# Patient Record
Sex: Male | Born: 1940 | Race: White | Hispanic: No | Marital: Married | Smoking: Never smoker
Health system: Southern US, Community
[De-identification: ages and names within clinical notes are randomized; demographics above are authoritative.]

## PROBLEM LIST (undated history)

## (undated) DIAGNOSIS — K859 Acute pancreatitis without necrosis or infection, unspecified: Secondary | ICD-10-CM

## (undated) DIAGNOSIS — F419 Anxiety disorder, unspecified: Secondary | ICD-10-CM

## (undated) DIAGNOSIS — F32A Depression, unspecified: Secondary | ICD-10-CM

## (undated) DIAGNOSIS — Z9889 Other specified postprocedural states: Secondary | ICD-10-CM

## (undated) DIAGNOSIS — R112 Nausea with vomiting, unspecified: Secondary | ICD-10-CM

## (undated) DIAGNOSIS — M1990 Unspecified osteoarthritis, unspecified site: Secondary | ICD-10-CM

## (undated) DIAGNOSIS — I350 Nonrheumatic aortic (valve) stenosis: Secondary | ICD-10-CM

## (undated) DIAGNOSIS — I4510 Unspecified right bundle-branch block: Secondary | ICD-10-CM

## (undated) DIAGNOSIS — H269 Unspecified cataract: Secondary | ICD-10-CM

## (undated) DIAGNOSIS — C61 Malignant neoplasm of prostate: Secondary | ICD-10-CM

## (undated) HISTORY — DX: Anxiety disorder, unspecified: F41.9

## (undated) HISTORY — DX: Depression, unspecified: F32.A

## (undated) HISTORY — DX: Unspecified cataract: H26.9

## (undated) HISTORY — DX: Unspecified osteoarthritis, unspecified site: M19.90

## (undated) HISTORY — PX: NO PAST SURGERIES: SHX2092

## (undated) HISTORY — PX: CATARACT EXTRACTION: SUR2

## (undated) HISTORY — PX: BACK SURGERY: SHX140

## (undated) HISTORY — PX: TONSILLECTOMY: SUR1361

## (undated) HISTORY — PX: CHOLECYSTECTOMY: SHX55

## (undated) HISTORY — PX: COLONOSCOPY: SHX174

---

## 2018-11-18 ENCOUNTER — Encounter: Payer: Self-pay | Admitting: Cardiology

## 2018-12-05 ENCOUNTER — Encounter: Payer: Self-pay | Admitting: Cardiology

## 2018-12-05 ENCOUNTER — Ambulatory Visit (INDEPENDENT_AMBULATORY_CARE_PROVIDER_SITE_OTHER): Payer: Medicare Other | Admitting: Cardiology

## 2018-12-05 ENCOUNTER — Other Ambulatory Visit: Payer: Self-pay

## 2018-12-05 DIAGNOSIS — R011 Cardiac murmur, unspecified: Secondary | ICD-10-CM

## 2018-12-05 DIAGNOSIS — E1159 Type 2 diabetes mellitus with other circulatory complications: Secondary | ICD-10-CM

## 2018-12-05 DIAGNOSIS — I10 Essential (primary) hypertension: Secondary | ICD-10-CM | POA: Insufficient documentation

## 2018-12-05 DIAGNOSIS — E088 Diabetes mellitus due to underlying condition with unspecified complications: Secondary | ICD-10-CM

## 2018-12-05 HISTORY — DX: Essential (primary) hypertension: I10

## 2018-12-05 HISTORY — DX: Type 2 diabetes mellitus with other circulatory complications: E11.59

## 2018-12-05 HISTORY — DX: Diabetes mellitus due to underlying condition with unspecified complications: E08.8

## 2018-12-05 HISTORY — DX: Cardiac murmur, unspecified: R01.1

## 2018-12-05 NOTE — Progress Notes (Signed)
Cardiology Office Note:    Date:  12/05/2018   ID:  Benjamin Davila, DOB 1940/12/21, MRN [id number]  PCP:  [facility]  Cardiologist:  Jenean Lindau, MD   Referring MD: [facility]    ASSESSMENT:    1. Cardiac murmur   2. Essential hypertension   3. Diabetes mellitus due to underlying condition with unspecified complications ([place])    PLAN:    In order of problems listed above:  1. Cardiac murmur: Undergo echocardiographic evaluation for this.  He has been told to have a cardiac murmur in the past. 2. Essential hypertension: Blood pressure is elevated but he mentions to me that his blood pressures are fine at home so we will have him keep a track of blood pressures and bring it for [place] in the next week or 2 when he comes for an echocardiogram.  Echocardiogram will be done to assess murmur heard on auscultation. 3. Diabetes mellitus: Managed by his primary care physician.  Also lipids are followed by primary care. 4. Patient will be seen in follow-up appointment in 3 months or earlier if he has any concerns.   Medication Adjustments/Labs and Tests Ordered: Current medicines are reviewed at length with the patient today.  Concerns regarding medicines are outlined above.  No orders of the defined types were placed in this encounter.  No orders of the defined types were placed in this encounter.    History of Present Illness:    Benjamin Davila is a 78 y.o. male who is being seen today for the evaluation of cardiac murmur and abnormal EKG at the request of Pa, [facility]  Patient is a pleasant 78 year old male.  He has past medical history of essential hypertension and diabetes mellitus.  He has been told to have a cardiac murmur.  He was found to have a irregular heart beat and EKG revealed sinus rhythm with PVCs and therefore was referred here.  He denies any chest pain orthopnea or PND.  He walks on a regular basis without any  symptoms.  At the time of my evaluation, the patient is alert awake oriented and in no distress.  History reviewed. No pertinent past medical history.  History reviewed. No pertinent surgical history.  Current Medications: Current Meds  Medication Sig  . atorvastatin (LIPITOR) 20 MG tablet Take 1 tablet by mouth daily.  . diazepam (VALIUM) 5 MG tablet Take 1 tablet by mouth daily.  Marland Kitchen LANTUS SOLOSTAR 100 UNIT/ML Solostar Pen   . losartan (COZAAR) 100 MG tablet Take 1 tablet by mouth daily.  . metFORMIN (GLUCOPHAGE) 500 MG tablet Take 1 tablet by mouth 2 (two) times a day.     Allergies:   Lisinopril   Social History   Socioeconomic History  . Marital status: Unknown    Spouse name: Not on file  . Number of children: Not on file  . Years of education: Not on file  . Highest education level: Not on file  Occupational History  . Not on file  Social Needs  . Financial resource strain: Not on file  . Food insecurity    Worry: Not on file    Inability: Not on file  . Transportation needs    Medical: Not on file    Non-medical: Not on file  Tobacco Use  . Smoking status: Never Smoker  . Smokeless tobacco: Never Used  Substance and Sexual Activity  . Alcohol use: Not on file  . Drug  use: Not on file  . Sexual activity: Not on file  Lifestyle  . Physical activity    Days per week: Not on file    Minutes per session: Not on file  . Stress: Not on file  Relationships  . Social Herbalist on phone: Not on file    Gets together: Not on file    Attends religious service: Not on file    Active member of club or organization: Not on file    Attends meetings of clubs or organizations: Not on file    Relationship status: Not on file  Other Topics Concern  . Not on file  Social History Narrative  . Not on file     Family History: The patient's family history is not on file.  ROS:   Please see the history of present illness.    All other systems reviewed and  are negative.  EKGs/Labs/Other Studies Reviewed:    The following studies were reviewed today: EKG reveals sinus rhythm and PVCs.   Recent Labs: No results found for requested labs within last 8760 hours.  Recent Lipid Panel No results found for: CHOL, TRIG, HDL, CHOLHDL, VLDL, LDLCALC, LDLDIRECT  Physical Exam:    VS:  BP (!) 162/84 (BP Location: Left Arm, Patient Position: Sitting, Cuff Size: Normal)   Pulse 73   Ht 5\' 10"  (1.778 m)   Wt 189 lb (85.7 kg)   SpO2 98%   BMI 27.12 kg/m     Wt Readings from Last 3 Encounters:  12/05/18 189 lb (85.7 kg)     GEN: Patient is in no acute distress HEENT: Normal NECK: No JVD; No carotid bruits LYMPHATICS: No lymphadenopathy CARDIAC: S1 S2 regular, 2/6 systolic murmur at the apex. RESPIRATORY:  Clear to auscultation without rales, wheezing or rhonchi  ABDOMEN: Soft, non-tender, non-distended MUSCULOSKELETAL:  No edema; No deformity  SKIN: Warm and dry NEUROLOGIC:  Alert and oriented x 3 PSYCHIATRIC:  Normal affect    Signed, Jenean Lindau, MD  12/05/2018 9:52 AM    [facility]

## 2018-12-05 NOTE — Addendum Note (Signed)
Addended by: Beckey Rutter on: 12/05/2018 10:06 AM   Modules accepted: Orders

## 2018-12-05 NOTE — Patient Instructions (Addendum)
Medication Instructions:  Your physician recommends that you continue on your current medications as directed. Please refer to the Current Medication list given to you today.  If you need a refill on your cardiac medications before your next appointment, please call your pharmacy.   Lab work: NONE If you have labs (blood work) drawn today and your tests are completely normal, you will receive your results only by: Marland Kitchen MyChart Message (if you have MyChart) OR . A paper copy in the mail If you have any lab test that is abnormal or we need to change your treatment, we will call you to review the results.  Testing/Procedures: You had an EKG performed today  Your physician has requested that you have an echocardiogram. Echocardiography is a painless test that uses sound waves to create images of your heart. It provides your doctor with information about the size and shape of your heart and how well your heart's chambers and valves are working. This procedure takes approximately one hour. There are no restrictions for this procedure.   Follow-Up: At [facility], you and your health needs are our priority.  As part of our continuing mission to provide you with exceptional heart care, we have created designated Provider Care Teams.  These Care Teams include your primary Cardiologist (physician) and Advanced Practice Providers (APPs -  Physician Assistants and Nurse Practitioners) who all work together to provide you with the care you need, when you need it. You will need a follow up appointment in 3 months.   Any Other Special Instructions Will Be Listed Below  Echocardiogram An echocardiogram is a procedure that uses painless sound waves (ultrasound) to produce an image of the heart. Images from an echocardiogram can provide important information about:  Signs of coronary artery disease (CAD).  Aneurysm detection. An aneurysm is a weak or damaged part of an artery wall that bulges out from the  normal force of blood pumping through the body.  Heart size and shape. Changes in the size or shape of the heart can be associated with certain conditions, including heart failure, aneurysm, and CAD.  Heart muscle function.  Heart valve function.  Signs of a past heart attack.  Fluid buildup around the heart.  Thickening of the heart muscle.  A tumor or infectious growth around the heart valves. Tell a health care provider about:  Any allergies you have.  All medicines you are taking, including vitamins, herbs, eye drops, creams, and over-the-counter medicines.  Any blood disorders you have.  Any surgeries you have had.  Any medical conditions you have.  Whether you are pregnant or may be pregnant. What are the risks? Generally, this is a safe procedure. However, problems may occur, including:  Allergic reaction to dye (contrast) that may be used during the procedure. What happens before the procedure? No specific preparation is needed. You may eat and drink normally. What happens during the procedure?   An IV tube may be inserted into one of your veins.  You may receive contrast through this tube. A contrast is an injection that improves the quality of the pictures from your heart.  A gel will be applied to your chest.  A wand-like tool (transducer) will be moved over your chest. The gel will help to transmit the sound waves from the transducer.  The sound waves will harmlessly bounce off of your heart to allow the heart images to be captured in real-time motion. The images will be recorded on a computer. The  procedure may vary among health care providers and hospitals. What happens after the procedure?  You may return to your normal, everyday life, including diet, activities, and medicines, unless your health care provider tells you not to do that. Summary  An echocardiogram is a procedure that uses painless sound waves (ultrasound) to produce an image of the  heart.  Images from an echocardiogram can provide important information about the size and shape of your heart, heart muscle function, heart valve function, and fluid buildup around your heart.  You do not need to do anything to prepare before this procedure. You may eat and drink normally.  After the echocardiogram is completed, you may return to your normal, everyday life, unless your health care provider tells you not to do that. This information is not intended to replace advice given to you by your health care provider. Make sure you discuss any questions you have with your health care provider. Document Released: 05/04/2000 Document Revised: 08/28/2018 Document Reviewed: 06/09/2016 Elsevier Patient Education  2020 [employer].

## 2018-12-11 ENCOUNTER — Ambulatory Visit (HOSPITAL_BASED_OUTPATIENT_CLINIC_OR_DEPARTMENT_OTHER)
Admission: RE | Admit: 2018-12-11 | Discharge: 2018-12-11 | Disposition: A | Discharge status: Home / Self Care | Payer: Medicare Other | Source: Ambulatory Visit | Attending: Cardiology | Admitting: Cardiology

## 2018-12-11 ENCOUNTER — Other Ambulatory Visit: Payer: Self-pay

## 2018-12-11 DIAGNOSIS — I10 Essential (primary) hypertension: Secondary | ICD-10-CM | POA: Insufficient documentation

## 2018-12-11 NOTE — Progress Notes (Signed)
  Echocardiogram 2D Echocardiogram has been performed.  Benjamin Davila 12/11/2018, 2:38 PM

## 2018-12-18 ENCOUNTER — Telehealth: Payer: Self-pay

## 2018-12-18 NOTE — Telephone Encounter (Signed)
Information relayed, patient requested printed copy to be mailed. Copy also sent to Dr. Hope Budds office per Dr. Docia Furl request.

## 2018-12-18 NOTE — Telephone Encounter (Signed)
-----   Message from Jenean Lindau, MD sent at 12/12/2018  8:02 AM EDT ----- Normal systolic function mild to moderate aortic regurgitation.  Will discuss more at appointment. Jenean Lindau, MD 12/12/2018 8:01 AM

## 2019-03-09 ENCOUNTER — Ambulatory Visit: Payer: Medicare Other | Admitting: Cardiology

## 2019-03-17 ENCOUNTER — Encounter: Payer: Self-pay | Admitting: Cardiology

## 2019-03-17 ENCOUNTER — Ambulatory Visit (INDEPENDENT_AMBULATORY_CARE_PROVIDER_SITE_OTHER): Payer: Medicare Other | Admitting: Cardiology

## 2019-03-17 ENCOUNTER — Other Ambulatory Visit: Payer: Self-pay

## 2019-03-17 VITALS — BP 142/72 | HR 72 | Ht 70.0 in | Wt 193.0 lb

## 2019-03-17 DIAGNOSIS — I350 Nonrheumatic aortic (valve) stenosis: Secondary | ICD-10-CM | POA: Insufficient documentation

## 2019-03-17 DIAGNOSIS — E088 Diabetes mellitus due to underlying condition with unspecified complications: Secondary | ICD-10-CM

## 2019-03-17 DIAGNOSIS — I10 Essential (primary) hypertension: Secondary | ICD-10-CM | POA: Diagnosis not present

## 2019-03-17 HISTORY — DX: Nonrheumatic aortic (valve) stenosis: I35.0

## 2019-03-17 NOTE — Patient Instructions (Signed)

## 2019-03-17 NOTE — Progress Notes (Signed)
Cardiology Office Note:    Date:  03/17/2019   ID:  Benjamin Davila, DOB [birth year], MRN [id number]  PCP:  Benjamin Loader, FNP  Cardiologist:  Benjamin Lindau, MD   Referring MD: [facility]    ASSESSMENT:    1. Essential hypertension   2. Aortic stenosis, moderate   3. Diabetes mellitus due to underlying condition with unspecified complications ([place])    PLAN:    In order of problems listed above:  1. Mild to moderate aortic stenosis: I discussed my findings with the patient at extensive length.  Patient had multiple questions which were answered to his satisfaction. 2. Diabetes mellitus and dyslipidemia: Patient is on statin therapy and a coated baby aspirin on a daily basis and encouraged him to continue it.  I told him to walk at least half an hour a day 5 days a week he promises to do so.  Again he is asymptomatic. 3. Patient will be seen in follow-up appointment in 6 months or earlier if the patient has any concerns    Medication Adjustments/Labs and Tests Ordered: Current medicines are reviewed at length with the patient today.  Concerns regarding medicines are outlined above.  No orders of the defined types were placed in this encounter.  No orders of the defined types were placed in this encounter.    No chief complaint on file.    History of Present Illness:    Benjamin Davila is a 78 y.o. male.  Patient was evaluated by me for cardiac murmur.  He is found to have moderate to mild aortic stenosis on echocardiogram.  Asked him multiple questions.  He denies any chest pain orthopnea PND palpitations or dizzy spells.  He is a very active gentleman.  At the time of my evaluation, the patient is alert awake oriented and in no distress.  History reviewed. No pertinent past medical history.  History reviewed. No pertinent surgical history.  Current Medications: Current Meds  Medication Sig   atorvastatin (LIPITOR) 20 MG tablet Take 1 tablet by mouth  daily.   diazepam (VALIUM) 5 MG tablet Take 1 tablet by mouth daily.   LANTUS SOLOSTAR 100 UNIT/ML Solostar Pen    losartan (COZAAR) 100 MG tablet Take 1 tablet by mouth daily.   metFORMIN (GLUCOPHAGE) 500 MG tablet Take 1 tablet by mouth 2 (two) times a day.   triamcinolone cream (KENALOG) 0.1 %      Allergies:   Lisinopril   Social History   Socioeconomic History   Marital status: Married    Spouse name: Not on file   Number of children: Not on file   Years of education: Not on file   Highest education level: Not on file  Occupational History   Not on file  Social Needs   Financial resource strain: Not on file   Food insecurity    Worry: Not on file    Inability: Not on file   Transportation needs    Medical: Not on file    Non-medical: Not on file  Tobacco Use   Smoking status: Never Smoker   Smokeless tobacco: Never Used  Substance and Sexual Activity   Alcohol use: Not on file   Drug use: Not on file   Sexual activity: Not on file  Lifestyle   Physical activity    Days per week: Not on file    Minutes per session: Not on file   Stress: Not on file  Relationships   Social  connections    Talks on phone: Not on file    Gets together: Not on file    Attends religious service: Not on file    Active member of club or organization: Not on file    Attends meetings of clubs or organizations: Not on file    Relationship status: Not on file  Other Topics Concern   Not on file  Social History Narrative   Not on file     Family History: The patient's family history is not on file.  ROS:   Please see the history of present illness.    All other systems reviewed and are negative.  EKGs/Labs/Other Studies Reviewed:    The following studies were reviewed today: IMPRESSIONS    1. The left ventricle has normal systolic function with an ejection fraction of 60-65%. The cavity size was normal. There is mild concentric left ventricular  hypertrophy. Left ventricular diastolic Doppler parameters are consistent with impaired  relaxation. Elevated mean left atrial pressure No evidence of left ventricular regional wall motion abnormalities.  2. The right ventricle has normal systolic function. The cavity was normal. There is no increase in right ventricular wall thickness.  3. The mitral valve is degenerative. There is mild mitral annular calcification present. No evidence of mitral valve stenosis.  4. The aortic valve is tricuspid. Moderate thickening of the aortic valve. Mild calcification of the aortic valve. Aortic valve regurgitation is mild to moderate by color flow Doppler. Mild-moderate stenosis of the aortic valve.  5. The aorta is normal in size and structure.  6. The aortic root, ascending aorta and aortic arch are normal in size and structure.   Recent Labs: No results found for requested labs within last 8760 hours.  Recent Lipid Panel No results found for: CHOL, TRIG, HDL, CHOLHDL, VLDL, LDLCALC, LDLDIRECT  Physical Exam:    VS:  BP (!) 142/72 (BP Location: Left Arm, Patient Position: Sitting, Cuff Size: Normal)    Pulse 72    Ht 5\' 10"  (1.778 m)    Wt 193 lb 0.6 oz (87.6 kg)    SpO2 96%    BMI 27.70 kg/m     Wt Readings from Last 3 Encounters:  03/17/19 193 lb 0.6 oz (87.6 kg)  12/05/18 189 lb (85.7 kg)     GEN: Patient is in no acute distress HEENT: Normal NECK: No JVD; No carotid bruits LYMPHATICS: No lymphadenopathy CARDIAC: Hear sounds regular, 2/6 systolic murmur at the apex. RESPIRATORY:  Clear to auscultation without rales, wheezing or rhonchi  ABDOMEN: Soft, non-tender, non-distended MUSCULOSKELETAL:  No edema; No deformity  SKIN: Warm and dry NEUROLOGIC:  Alert and oriented x 3 PSYCHIATRIC:  Normal affect   Signed, Benjamin Lindau, MD  03/17/2019 10:41 AM    [place]

## 2019-05-27 ENCOUNTER — Other Ambulatory Visit (HOSPITAL_COMMUNITY): Payer: Self-pay | Admitting: Urology

## 2019-05-27 ENCOUNTER — Other Ambulatory Visit: Payer: Self-pay | Admitting: Urology

## 2019-05-27 DIAGNOSIS — C61 Malignant neoplasm of prostate: Secondary | ICD-10-CM

## 2019-06-03 ENCOUNTER — Ambulatory Visit: Payer: Medicare Other | Attending: Internal Medicine

## 2019-06-03 DIAGNOSIS — Z23 Encounter for immunization: Secondary | ICD-10-CM | POA: Insufficient documentation

## 2019-06-03 NOTE — Progress Notes (Signed)
   Covid-19 Vaccination Clinic  Name:  Benjamin Davila    MRN: [id number] DOB: 1940/06/03  06/03/2019  Mr. Trudell was observed post Covid-19 immunization for 15 minutes without incidence. He was provided with Vaccine Information Sheet and instruction to access the V-Safe system.   Mr. Kowalsky was instructed to call 911 with any severe reactions post vaccine: Marland Kitchen Difficulty breathing  . Swelling of your face and throat  . A fast heartbeat  . A bad rash all over your body  . Dizziness and weakness    Immunizations Administered    Name Date Dose VIS Date Route   Pfizer COVID-19 Vaccine 06/03/2019  9:34 AM 0.3 mL 05/01/2019 Intramuscular   Manufacturer: [place]   Lot: [id number]   [place]: [id number]

## 2019-06-04 ENCOUNTER — Ambulatory Visit: Payer: Medicare Other

## 2019-06-15 ENCOUNTER — Encounter (HOSPITAL_COMMUNITY)
Admission: RE | Admit: 2019-06-15 | Discharge: 2019-06-15 | Disposition: A | Discharge status: Home / Self Care | Payer: Medicare Other | Source: Ambulatory Visit | Attending: Urology | Admitting: Urology

## 2019-06-15 ENCOUNTER — Other Ambulatory Visit: Payer: Self-pay

## 2019-06-15 DIAGNOSIS — C61 Malignant neoplasm of prostate: Secondary | ICD-10-CM | POA: Diagnosis present

## 2019-06-15 MED ORDER — TECHNETIUM TC 99M MEDRONATE IV KIT
20.0000 | PACK | Freq: Once | INTRAVENOUS | Status: AC | PRN
  Administered 2019-06-15: 20 via INTRAVENOUS

## 2019-06-18 ENCOUNTER — Encounter: Payer: Self-pay | Admitting: *Deleted

## 2019-06-24 ENCOUNTER — Ambulatory Visit: Payer: Medicare Other | Attending: Internal Medicine

## 2019-06-24 ENCOUNTER — Ambulatory Visit
Admission: RE | Admit: 2019-06-24 | Discharge: 2019-06-24 | Disposition: A | Discharge status: Home / Self Care | Payer: Medicare Other | Source: Ambulatory Visit | Attending: Radiation Oncology | Admitting: Radiation Oncology

## 2019-06-24 ENCOUNTER — Other Ambulatory Visit: Payer: Self-pay

## 2019-06-24 ENCOUNTER — Encounter: Payer: Self-pay | Admitting: Radiation Oncology

## 2019-06-24 ENCOUNTER — Telehealth: Payer: Self-pay | Admitting: *Deleted

## 2019-06-24 DIAGNOSIS — C61 Malignant neoplasm of prostate: Secondary | ICD-10-CM | POA: Insufficient documentation

## 2019-06-24 DIAGNOSIS — Z23 Encounter for immunization: Secondary | ICD-10-CM | POA: Insufficient documentation

## 2019-06-24 HISTORY — DX: Malignant neoplasm of prostate: C61

## 2019-06-24 NOTE — Progress Notes (Signed)
   Covid-19 Vaccination Clinic  Name:  Anees Masten    MRN: [id number] DOB: 1940/11/04  06/24/2019  Mr. Straub was observed post Covid-19 immunization for 15 minutes without incidence. He was provided with Vaccine Information Sheet and instruction to access the V-Safe system.   Mr. Toews was instructed to call 911 with any severe reactions post vaccine: Marland Kitchen Difficulty breathing  . Swelling of your face and throat  . A fast heartbeat  . A bad rash all over your body  . Dizziness and weakness    Immunizations Administered    Name Date Dose VIS Date Route   Pfizer COVID-19 Vaccine 06/24/2019  9:54 AM 0.3 mL 05/01/2019 Intramuscular   Manufacturer: [place]   Lot: [id number]   [place]: [id number]

## 2019-06-24 NOTE — Progress Notes (Signed)
Radiation Oncology         [phone] ________________________________  Initial Outpatient Consultation - Conducted via telephone due to current COVID-19 concerns for limiting patient exposure  Name: Benjamin Davila MRN: [id number]  Date: 06/24/2019  DOB: 1940/06/17  [id number], Beryle Lathe, FNP  Festus Aloe, MD   REFERRING PHYSICIAN: Festus Aloe, MD  DIAGNOSIS: 79 y.o. 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    ICD-10-CM   1. Malignant neoplasm of prostate ([place])  C61     HISTORY OF PRESENT ILLNESS: Benjamin Davila is a 79 y.o. male with a diagnosis of prostate cancer. He was noted to have an elevated PSA of 7.8 in 01/2019 by his primary care provider, Benjamin Bal, NP.  This was noted to be gradually rising since 01/2018 when the PSA was 4.54 and then increased to 5.7 in 10/2018 and 7.8 in 01/2019.  Accordingly, he was referred for evaluation in urology by Dr. Junious Davila on 04/03/19,  digital rectal examination was performed at that time revealing a 10 mm nodule in the right mid gland prostate.  The patient proceeded to transrectal ultrasound with 12 biopsies of the prostate on 05/05/19.  The prostate volume measured 44 cc.  Out of 12 core biopsies, 10 were positive.  The maximum Gleason score was 4+5, and this was seen in the right base.  Additionally, Gleason 4+3 was seen in the left mid, left mid lateral, right mid, right mid lateral, left apex, left apex lateral, right apex and right apex lateral and Gleason 3+4 disease was seen in the left base.  He underwent CT A/P and bone scan on 06/15/2019 for disease staging.  Both of these studies were without evidence of visceral or osseous metastatic disease.  He also had in office cystoscopy performed on 06/22/2019 for further evaluation of his progressive LUTS.  By patient report, this did not show any concerning findings and confirmed BPH with B00.  The patient reviewed the biopsy and  imaging results with his urologist and he has kindly been referred today for discussion of potential radiation treatment options.   PREVIOUS RADIATION THERAPY: No  PAST MEDICAL HISTORY:  Past Medical History:  Diagnosis Date  . Prostate cancer ([place])       PAST SURGICAL HISTORY:History reviewed. No pertinent surgical history.  FAMILY HISTORY: No family history on file.  SOCIAL HISTORY:  Social History   Socioeconomic History  . Marital status: Married    Spouse name: Not on file  . Number of children: Not on file  . Years of education: Not on file  . Highest education level: Not on file  Occupational History  . Not on file  Tobacco Use  . Smoking status: Never Smoker  . Smokeless tobacco: Never Used  Substance and Sexual Activity  . Alcohol use: Not on file  . Drug use: Not on file  . Sexual activity: Not on file  Other Topics Concern  . Not on file  Social History Narrative  . Not on file   Social Determinants of Health   Financial Resource Strain:   . Difficulty of Paying Living Expenses: Not on file  Food Insecurity:   . Worried About Charity fundraiser in the Last Year: Not on file  . Ran Out of Food in the Last Year: Not on file  Transportation Needs:   . Lack of Transportation (Medical): Not on file  . Lack of Transportation (Non-Medical): Not on file  Physical Activity:   . Days of Exercise per Week: Not on file  . Minutes of Exercise per Session: Not on file  Stress:   . Feeling of Stress : Not on file  Social Connections:   . Frequency of Communication with Friends and Family: Not on file  . Frequency of Social Gatherings with Friends and Family: Not on file  . Attends Religious Services: Not on file  . Active Member of Clubs or Organizations: Not on file  . Attends Archivist Meetings: Not on file  . Marital Status: Not on file  Intimate Partner Violence:   . Fear of Current or Ex-Partner: Not on file  . Emotionally Abused: Not on file   . Physically Abused: Not on file  . Sexually Abused: Not on file    ALLERGIES: Lisinopril  MEDICATIONS:  Current Outpatient Medications  Medication Sig Dispense Refill  . atorvastatin (LIPITOR) 20 MG tablet Take 1 tablet by mouth daily.    . diazepam (VALIUM) 5 MG tablet Take 1 tablet by mouth daily.    Marland Kitchen LANTUS SOLOSTAR 100 UNIT/ML Solostar Pen     . losartan (COZAAR) 100 MG tablet Take 1 tablet by mouth daily.    . metFORMIN (GLUCOPHAGE) 500 MG tablet Take 1 tablet by mouth 2 (two) times a day.    . triamcinolone cream (KENALOG) 0.1 %      No current facility-administered medications for this encounter.    REVIEW OF SYSTEMS:  On review of systems, the patient reports that he is doing well overall. He denies any chest pain, shortness of breath, cough, fevers, chills, night sweats, unintended weight changes. He denies any bowel disturbances, and denies abdominal pain, nausea or vomiting. He denies any new musculoskeletal or joint aches or pains. His IPSS was 21, indicating severe urinary symptoms with weak stream, intermittency, feelings of incomplete emptying and nocturia 4 times per night.. His SHIM was 5, indicating he has severe erectile dysfunction. A complete review of systems is obtained and is otherwise negative.    PHYSICAL EXAM:  Wt Readings from Last 3 Encounters:  03/17/19 193 lb 0.6 oz (87.6 kg)  12/05/18 189 lb (85.7 kg)   Temp Readings from Last 3 Encounters:  No data found for Temp   BP Readings from Last 3 Encounters:  03/17/19 (!) 142/72  12/05/18 (!) 162/84   Pulse Readings from Last 3 Encounters:  03/17/19 72  12/05/18 73   Pain Assessment Pain Score: 5  Pain Frequency: Occasional/10  Unable to assess due to telephone consult visit format.  KPS = 90  100 - Normal; no complaints; no evidence of disease. 90   - Able to carry on normal activity; minor signs or symptoms of disease. 80   - Normal activity with effort; some signs or symptoms of  disease. 83   - Cares for self; unable to carry on normal activity or to do active work. 60   - Requires occasional assistance, but is able to care for most of his personal needs. 50   - Requires considerable assistance and frequent medical care. 75   - Disabled; requires special care and assistance. 81   - Severely disabled; hospital admission is indicated although death not imminent. 47   - Very sick; hospital admission necessary; active supportive treatment necessary. 10   - Moribund; fatal processes progressing rapidly. 0     - Dead  Karnofsky DA, Abelmann WH, Craver LS and Burchenal [facility] [phone]) The use of the nitrogen  mustards in the palliative treatment of carcinoma: with particular reference to bronchogenic carcinoma Cancer 1 634-56  LABORATORY DATA:  No results found for: WBC, HGB, HCT, MCV, PLT No results found for: NA, K, CL, CO2 No results found for: ALT, AST, GGT, ALKPHOS, BILITOT   RADIOGRAPHY: NM Bone Scan Whole Body  Result Date: 06/15/2019 CLINICAL DATA:  79 year old male with new diagnosis of prostate cancer. PSA of 8.23. EXAM: NUCLEAR MEDICINE WHOLE BODY BONE SCAN TECHNIQUE: Whole body anterior and posterior images were obtained approximately 3 hours after intravenous injection of radiopharmaceutical. RADIOPHARMACEUTICALS:  21.0 mCi Technetium-89m MDP IV COMPARISON:  None. FINDINGS: No abnormal areas of bony activity are noted to suggest osseous metastatic disease. Mild increased activity within both knees, LEFT wrist, bilateral shoulders are compatible with degenerative changes. IMPRESSION: No evidence of osseous metastatic disease. Electronically Signed   By: Margarette Canada M.D.   On: 06/15/2019 16:46      IMPRESSION/PLAN:  This visit was conducted via telephone to spare the patient unnecessary potential exposure in the healthcare setting during the current COVID-19 pandemic. 1. 79 y.o. gentleman with Stage T2b adenocarcinoma of the prostate with Gleason Score of 4+5, and PSA of  8.23. We discussed the patient's workup and outlined the nature of prostate cancer in this setting. The patient's T stage, Gleason's score, and PSA put him into the high risk group. Accordingly, he is eligible for a variety of potential treatment options including LT-ADT concurrent with either 8 weeks of external radiation or 5 weeks of external radiation with an upfront brachytherapy boost. We discussed the available radiation techniques, and focused on the details and logistics and delivery. The patient is not an ideal candidate for brachytherapy boost with severe LUTS associated with B00. We therefore discussed and outlined the risks, benefits, short and long-term effects associated with daily external beam radiotherapy and compared and contrasted these with prostatectomy. We discussed the role of SpaceOAR gel in reducing the rectal toxicity associated with radiotherapy. We also detailed the role of ADT in the treatment of high risk prostate cancer and outlined the associated side effects that could be expected with this therapy.  He and his wife were encouraged to ask questions that were answered to their stated satisfaction.  At the conclusion of our conversation the patient is interested in moving forward with LT-ADT concurrent with 8 weeks of daily external beam radiotherapy to the prostate and pelvic lymph nodes.  He appears to have a good understanding of his disease and our treatment recommendations which are of curative intent. Therefore, we will move forward with coordinating a follow-up appointment with Dr. Junious Davila, first available, to initiate ADT now. Approximately 8 weeks after the start of ADT, he will be scheduled for fiducial markers and SpaceOAR gel insertion at [facility], prior to [place], in anticipation of beginning his daily radiation treatments shortly thereafter.  He is comfortable with and is in agreement with the stated plan.  We enjoyed meeting him and his wife today  and look forward to participating in the care of this nice gentleman. We will share our findings with Dr. Junious Davila and move forward with coordinating his treatment.  Given current concerns for patient exposure during the COVID-19 pandemic, this encounter was conducted via telephone. The patient was notified in advance and was offered a [place] meeting to allow for face to face communication but unfortunately reported that he/she did not have the appropriate resources/technology to support such a visit and instead preferred to proceed with telephone  consult. The patient has given verbal consent for this type of encounter. The time spent during this encounter was 60 minutes. The attendants for this meeting include Tyler Pita MD, Andi Devon, patient, Darreon Anspach and his wife. During the encounter, Tyler Pita MD, and Andi Devon, were located at [facility] Radiation Oncology Department.  Patient, Martavious Miera and his wife were located at home.    Nicholos Johns, PA-C    Tyler Pita, MD  [facility] Direct Dial: [phone]  Fax: [phone] [email or web address]  Skype  LinkedIn

## 2019-06-24 NOTE — Progress Notes (Signed)
Spoke with patient and wife via phone. AUA score is 21. Questions and concerns addressed. States his bone scan was negative. Patient is scheduled for second COVID shot today.

## 2019-06-24 NOTE — Patient Instructions (Signed)
Coronavirus (COVID-19) Are you at risk?  Are you at risk for the Coronavirus (COVID-19)?  To be considered HIGH RISK for Coronavirus (COVID-19), you have to meet the following criteria:  . Traveled to [place], [place], [place], [place] or [place]; or in the [place] to [place], [place], [place], or [place]; and have fever, cough, and shortness of breath within the last 2 weeks of travel OR . Been in close contact with a person diagnosed with COVID-19 within the last 2 weeks and have fever, cough, and shortness of breath . IF YOU DO NOT MEET THESE CRITERIA, YOU ARE CONSIDERED LOW RISK FOR COVID-19.  What to do if you are HIGH RISK for COVID-19?  . If you are having a medical emergency, call 911. . Seek medical care right away. Before you go to a doctor's office, urgent care or emergency department, call ahead and tell them about your recent travel, contact with someone diagnosed with COVID-19, and your symptoms. You should receive instructions from your physician's office regarding next steps of care.  . When you arrive at healthcare provider, tell the healthcare staff immediately you have returned from visiting [place], [place], [place], [place] or [place]; or traveled in the [place] to [place], [place], [place], or [place]; in the last two weeks or you have been in close contact with a person diagnosed with COVID-19 in the last 2 weeks.   . Tell the health care staff about your symptoms: fever, cough and shortness of breath. . After you have been seen by a medical provider, you will be either: o Tested for (COVID-19) and discharged home on quarantine except to seek medical care if symptoms worsen, and asked to  - Stay home and avoid contact with others until you get your results (4-5 days)  - Avoid travel on public transportation if possible (such as bus, train, or airplane) or o Sent to the Emergency Department by EMS for evaluation, COVID-19 testing, and possible  admission depending on your condition and test results.  What to do if you are LOW RISK for COVID-19?  Reduce your risk of any infection by using the same precautions used for avoiding the common cold or flu:  . Wash your hands often with soap and warm water for at least 20 seconds.  If soap and water are not readily available, use an alcohol-based hand sanitizer with at least 60% alcohol.  . If coughing or sneezing, cover your mouth and nose by coughing or sneezing into the elbow areas of your shirt or coat, into a tissue or into your sleeve (not your hands). . Avoid shaking hands with others and consider head nods or verbal greetings only. . Avoid touching your eyes, nose, or mouth with unwashed hands.  . Avoid close contact with people who are sick. . Avoid places or events with large numbers of people in one location, like concerts or sporting events. . Carefully consider travel plans you have or are making. . If you are planning any travel outside or inside the [place], visit the CDC's Travelers' Health webpage for the latest health notices. . If you have some symptoms but not all symptoms, continue to monitor at home and seek medical attention if your symptoms worsen. . If you are having a medical emergency, call 911.   ADDITIONAL HEALTHCARE OPTIONS FOR PATIENTS  [facility] / e-Visit: [email or web address]         [facility]: [phone]  [facility]: [phone]                   [facility]: [phone]   

## 2019-06-24 NOTE — Telephone Encounter (Signed)
CALLED PATIENT TO INFORM OF ADT APPT. ON 07/16/19 - ARRIVAL TIME- 9:15 AM @ DR. Lyndal Rainbow OFFICE, LVM FOR A RETURN CALL

## 2019-07-24 ENCOUNTER — Telehealth: Payer: Self-pay | Admitting: Radiation Oncology

## 2019-07-24 NOTE — Telephone Encounter (Signed)
-----   Message from Freeman Caldron, [place] sent at 07/24/2019 12:13 PM EST ----- Regarding: FW: QUESTION Sam, Please call Ms. Gresham to let her know that the prostate radiation will not affect her husband's immune system. Thank you!!! Lia Foyer ----- Message ----- From: Kerri Perches Sent: 07/24/2019  11:14 AM EST To: Freeman Caldron, PA-C Subject: QUESTION                                       Hi Ashlyn,   This patient's wife- Butch Penny called and she is wanting to know if the treatments will effect her husband's immune system.  Mrs. Barto's phone number is [phone].  Thanks,  [employer]

## 2019-07-24 NOTE — Telephone Encounter (Signed)
Phoned patient's wife, Butch Penny, as requested by [employer], PA-C. Explained that prostate radiation will NOT affect her husband's immune system. She verbalized understanding and expressed appreciation for the return call.

## 2019-07-31 ENCOUNTER — Other Ambulatory Visit: Payer: Self-pay | Admitting: Urology

## 2019-07-31 DIAGNOSIS — C61 Malignant neoplasm of prostate: Secondary | ICD-10-CM

## 2019-08-11 ENCOUNTER — Telehealth: Payer: Self-pay | Admitting: *Deleted

## 2019-08-11 NOTE — Telephone Encounter (Signed)
CALLED PATIENT TO INFORM THAT MRI HAS BEEN MOVED - ARRIVAL TIME- 11:30 AM @ [facility], SPOKE WITH PATIENT AND HE IS AWARE OF THIS APPT. CHANGE AND IS GOOD WITH IT

## 2019-08-25 DIAGNOSIS — M25552 Pain in left hip: Secondary | ICD-10-CM | POA: Insufficient documentation

## 2019-08-25 HISTORY — DX: Pain in left hip: M25.552

## 2019-09-14 ENCOUNTER — Telehealth: Payer: Self-pay | Admitting: *Deleted

## 2019-09-14 NOTE — Telephone Encounter (Signed)
CALLED PATIENT TO REMIND OF APPTS. FOR 09-15-19, LVM FOR A RETURN CALL

## 2019-09-15 ENCOUNTER — Other Ambulatory Visit: Payer: Self-pay

## 2019-09-15 ENCOUNTER — Ambulatory Visit (HOSPITAL_COMMUNITY)
Admission: RE | Admit: 2019-09-15 | Discharge: 2019-09-15 | Disposition: A | Discharge status: Home / Self Care | Payer: Medicare Other | Source: Ambulatory Visit | Attending: Urology | Admitting: Urology

## 2019-09-15 ENCOUNTER — Ambulatory Visit
Admission: RE | Admit: 2019-09-15 | Discharge: 2019-09-15 | Disposition: A | Discharge status: Home / Self Care | Payer: Medicare Other | Source: Ambulatory Visit | Attending: Radiation Oncology | Admitting: Radiation Oncology

## 2019-09-15 ENCOUNTER — Other Ambulatory Visit (HOSPITAL_COMMUNITY): Payer: Medicare Other

## 2019-09-15 ENCOUNTER — Encounter: Payer: Self-pay | Admitting: Medical Oncology

## 2019-09-15 DIAGNOSIS — C61 Malignant neoplasm of prostate: Secondary | ICD-10-CM | POA: Insufficient documentation

## 2019-09-17 NOTE — Progress Notes (Signed)
  Radiation Oncology         [phone]) [phone] ________________________________  Name: Benjamin Davila MRN: [id number]  Date: 09/15/2019  DOB: [birth year]  SIMULATION AND TREATMENT PLANNING NOTE    ICD-10-CM   1. Malignant neoplasm of prostate ([place])  C61     DIAGNOSIS:  79 y.o. 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  NARRATIVE:  The patient was brought to the [facility].  Identity was confirmed.  All relevant records and images related to the planned course of therapy were reviewed.  The patient freely provided informed written consent to proceed with treatment after reviewing the details related to the planned course of therapy. The consent form was witnessed and verified by the simulation staff.  Then, the patient was set-up in a stable reproducible supine position for radiation therapy.  A vacuum lock pillow device was custom fabricated to position his legs in a reproducible immobilized position.  Then, I performed a urethrogram under sterile conditions to identify the prostatic bed.  CT images were obtained.  Surface markings were placed.  The CT images were loaded into the planning software.  Then the prostate bed target, pelvic lymph node target and avoidance structures including the rectum, bladder, bowel and hips were contoured.  Treatment planning then occurred.  The radiation prescription was entered and confirmed.  A total of one complex treatment devices were fabricated. I have requested : Intensity Modulated Radiotherapy (IMRT) is medically necessary for this case for the following reason:  Rectal sparing.Marland Kitchen  PLAN:  The patient will receive 45 Gy in 25 fractions of 1.8 Gy, followed by a boost to the prostate to a total dose of 75 Gy with 15 additional fractions of 2 Gy.   ________________________________  Sheral Apley Tammi Klippel, M.D.

## 2019-09-22 DIAGNOSIS — C61 Malignant neoplasm of prostate: Secondary | ICD-10-CM | POA: Insufficient documentation

## 2019-09-24 ENCOUNTER — Other Ambulatory Visit: Payer: Self-pay

## 2019-09-24 ENCOUNTER — Ambulatory Visit
Admission: RE | Admit: 2019-09-24 | Discharge: 2019-09-24 | Disposition: A | Discharge status: Home / Self Care | Payer: Medicare Other | Source: Ambulatory Visit | Attending: Radiation Oncology | Admitting: Radiation Oncology

## 2019-09-24 DIAGNOSIS — C61 Malignant neoplasm of prostate: Secondary | ICD-10-CM | POA: Diagnosis not present

## 2019-09-25 ENCOUNTER — Other Ambulatory Visit: Payer: Self-pay

## 2019-09-25 ENCOUNTER — Ambulatory Visit
Admission: RE | Admit: 2019-09-25 | Discharge: 2019-09-25 | Disposition: A | Discharge status: Home / Self Care | Payer: Medicare Other | Source: Ambulatory Visit | Attending: Radiation Oncology | Admitting: Radiation Oncology

## 2019-09-25 DIAGNOSIS — C61 Malignant neoplasm of prostate: Secondary | ICD-10-CM | POA: Diagnosis not present

## 2019-09-28 ENCOUNTER — Ambulatory Visit
Admission: RE | Admit: 2019-09-28 | Discharge: 2019-09-28 | Disposition: A | Discharge status: Home / Self Care | Payer: Medicare Other | Source: Ambulatory Visit | Attending: Radiation Oncology | Admitting: Radiation Oncology

## 2019-09-28 ENCOUNTER — Other Ambulatory Visit: Payer: Self-pay

## 2019-09-28 DIAGNOSIS — C61 Malignant neoplasm of prostate: Secondary | ICD-10-CM | POA: Diagnosis not present

## 2019-09-29 ENCOUNTER — Other Ambulatory Visit: Payer: Self-pay

## 2019-09-29 ENCOUNTER — Telehealth: Payer: Self-pay | Admitting: Radiation Oncology

## 2019-09-29 ENCOUNTER — Ambulatory Visit
Admission: RE | Admit: 2019-09-29 | Discharge: 2019-09-29 | Disposition: A | Discharge status: Home / Self Care | Payer: Medicare Other | Source: Ambulatory Visit | Attending: Radiation Oncology | Admitting: Radiation Oncology

## 2019-09-29 DIAGNOSIS — C61 Malignant neoplasm of prostate: Secondary | ICD-10-CM | POA: Diagnosis not present

## 2019-09-29 NOTE — Telephone Encounter (Signed)
Received voicemail message from patient requesting wife accompany him on Friday only for PUT encounter. Patient reports difficulty comprehending some of the information received during visit. Patient understands his wife has been approved to accompany him for PUT encounter on Friday only. Patient verbalized understanding of all reviewed and appreciation for the call back.

## 2019-09-30 ENCOUNTER — Ambulatory Visit
Admission: RE | Admit: 2019-09-30 | Discharge: 2019-09-30 | Disposition: A | Discharge status: Home / Self Care | Payer: Medicare Other | Source: Ambulatory Visit | Attending: Radiation Oncology | Admitting: Radiation Oncology

## 2019-09-30 ENCOUNTER — Other Ambulatory Visit: Payer: Self-pay

## 2019-09-30 DIAGNOSIS — C61 Malignant neoplasm of prostate: Secondary | ICD-10-CM | POA: Diagnosis not present

## 2019-10-01 ENCOUNTER — Other Ambulatory Visit: Payer: Self-pay

## 2019-10-01 ENCOUNTER — Ambulatory Visit
Admission: RE | Admit: 2019-10-01 | Discharge: 2019-10-01 | Disposition: A | Discharge status: Home / Self Care | Payer: Medicare Other | Source: Ambulatory Visit | Attending: Radiation Oncology | Admitting: Radiation Oncology

## 2019-10-01 DIAGNOSIS — C61 Malignant neoplasm of prostate: Secondary | ICD-10-CM | POA: Diagnosis not present

## 2019-10-02 ENCOUNTER — Ambulatory Visit
Admission: RE | Admit: 2019-10-02 | Discharge: 2019-10-02 | Disposition: A | Discharge status: Home / Self Care | Payer: Medicare Other | Source: Ambulatory Visit | Attending: Radiation Oncology | Admitting: Radiation Oncology

## 2019-10-02 ENCOUNTER — Other Ambulatory Visit: Payer: Self-pay

## 2019-10-02 DIAGNOSIS — C61 Malignant neoplasm of prostate: Secondary | ICD-10-CM | POA: Diagnosis not present

## 2019-10-05 ENCOUNTER — Ambulatory Visit
Admission: RE | Admit: 2019-10-05 | Discharge: 2019-10-05 | Disposition: A | Discharge status: Home / Self Care | Payer: Medicare Other | Source: Ambulatory Visit | Attending: Radiation Oncology | Admitting: Radiation Oncology

## 2019-10-05 ENCOUNTER — Other Ambulatory Visit: Payer: Self-pay

## 2019-10-05 DIAGNOSIS — C61 Malignant neoplasm of prostate: Secondary | ICD-10-CM | POA: Diagnosis not present

## 2019-10-06 ENCOUNTER — Encounter: Payer: Self-pay | Admitting: Radiation Oncology

## 2019-10-06 ENCOUNTER — Encounter: Payer: Self-pay | Admitting: Medical Oncology

## 2019-10-06 ENCOUNTER — Other Ambulatory Visit: Payer: Self-pay

## 2019-10-06 ENCOUNTER — Ambulatory Visit
Admission: RE | Admit: 2019-10-06 | Discharge: 2019-10-06 | Disposition: A | Discharge status: Home / Self Care | Payer: Medicare Other | Source: Ambulatory Visit | Attending: Radiation Oncology | Admitting: Radiation Oncology

## 2019-10-06 DIAGNOSIS — C61 Malignant neoplasm of prostate: Secondary | ICD-10-CM | POA: Diagnosis not present

## 2019-10-06 NOTE — Progress Notes (Signed)
  Radiation Oncology         [phone]) [phone] ________________________________  Name: Benjamin Davila MRN: [id number]  Date: 10/06/2019  DOB: 07-Jan-1941  VIRTUAL SIMULATION NOTE  NARRATIVE:  The patient underwent simulation today for ongoing radiation therapy.  The existing CT study set was employed for the purpose of virtual treatment planning.  The target and avoidance structures were reviewed and in some cases modified.  Treatment planning then occurred.  The radiation boost prescription was entered and confirmed.   I have requested : Isodose Plan.   PLAN:  This modified radiation beam arrangement is intended to continue the current radiation dose to an additional 30 Gy in 15 fractions for a total cumulative dose of 75 Gy.  ------------------------------------------------  Sheral Apley. Tammi Klippel, M.D.

## 2019-10-07 ENCOUNTER — Ambulatory Visit
Admission: RE | Admit: 2019-10-07 | Discharge: 2019-10-07 | Disposition: A | Discharge status: Home / Self Care | Payer: Medicare Other | Source: Ambulatory Visit | Attending: Radiation Oncology | Admitting: Radiation Oncology

## 2019-10-07 ENCOUNTER — Other Ambulatory Visit: Payer: Self-pay

## 2019-10-07 DIAGNOSIS — C61 Malignant neoplasm of prostate: Secondary | ICD-10-CM | POA: Diagnosis not present

## 2019-10-08 ENCOUNTER — Other Ambulatory Visit: Payer: Self-pay

## 2019-10-08 ENCOUNTER — Ambulatory Visit
Admission: RE | Admit: 2019-10-08 | Discharge: 2019-10-08 | Disposition: A | Discharge status: Home / Self Care | Payer: Medicare Other | Source: Ambulatory Visit | Attending: Radiation Oncology | Admitting: Radiation Oncology

## 2019-10-08 DIAGNOSIS — C61 Malignant neoplasm of prostate: Secondary | ICD-10-CM | POA: Diagnosis not present

## 2019-10-09 ENCOUNTER — Other Ambulatory Visit: Payer: Self-pay

## 2019-10-09 ENCOUNTER — Ambulatory Visit
Admission: RE | Admit: 2019-10-09 | Discharge: 2019-10-09 | Disposition: A | Discharge status: Home / Self Care | Payer: Medicare Other | Source: Ambulatory Visit | Attending: Radiation Oncology | Admitting: Radiation Oncology

## 2019-10-09 DIAGNOSIS — C61 Malignant neoplasm of prostate: Secondary | ICD-10-CM | POA: Diagnosis not present

## 2019-10-12 ENCOUNTER — Other Ambulatory Visit: Payer: Self-pay

## 2019-10-12 ENCOUNTER — Ambulatory Visit
Admission: RE | Admit: 2019-10-12 | Discharge: 2019-10-12 | Disposition: A | Discharge status: Home / Self Care | Payer: Medicare Other | Source: Ambulatory Visit | Attending: Radiation Oncology | Admitting: Radiation Oncology

## 2019-10-12 DIAGNOSIS — C61 Malignant neoplasm of prostate: Secondary | ICD-10-CM | POA: Diagnosis not present

## 2019-10-13 ENCOUNTER — Other Ambulatory Visit: Payer: Self-pay

## 2019-10-13 ENCOUNTER — Ambulatory Visit
Admission: RE | Admit: 2019-10-13 | Discharge: 2019-10-13 | Disposition: A | Discharge status: Home / Self Care | Payer: Medicare Other | Source: Ambulatory Visit | Attending: Radiation Oncology | Admitting: Radiation Oncology

## 2019-10-13 DIAGNOSIS — C61 Malignant neoplasm of prostate: Secondary | ICD-10-CM | POA: Diagnosis not present

## 2019-10-14 ENCOUNTER — Ambulatory Visit
Admission: RE | Admit: 2019-10-14 | Discharge: 2019-10-14 | Disposition: A | Discharge status: Home / Self Care | Payer: Medicare Other | Source: Ambulatory Visit | Attending: Radiation Oncology | Admitting: Radiation Oncology

## 2019-10-14 ENCOUNTER — Other Ambulatory Visit: Payer: Self-pay

## 2019-10-14 DIAGNOSIS — C61 Malignant neoplasm of prostate: Secondary | ICD-10-CM | POA: Diagnosis not present

## 2019-10-15 ENCOUNTER — Other Ambulatory Visit: Payer: Self-pay

## 2019-10-15 ENCOUNTER — Ambulatory Visit
Admission: RE | Admit: 2019-10-15 | Discharge: 2019-10-15 | Disposition: A | Discharge status: Home / Self Care | Payer: Medicare Other | Source: Ambulatory Visit | Attending: Radiation Oncology | Admitting: Radiation Oncology

## 2019-10-15 DIAGNOSIS — C61 Malignant neoplasm of prostate: Secondary | ICD-10-CM | POA: Diagnosis not present

## 2019-10-16 ENCOUNTER — Ambulatory Visit
Admission: RE | Admit: 2019-10-16 | Discharge: 2019-10-16 | Disposition: A | Discharge status: Home / Self Care | Payer: Medicare Other | Source: Ambulatory Visit | Attending: Radiation Oncology | Admitting: Radiation Oncology

## 2019-10-16 ENCOUNTER — Other Ambulatory Visit: Payer: Self-pay

## 2019-10-16 DIAGNOSIS — C61 Malignant neoplasm of prostate: Secondary | ICD-10-CM | POA: Diagnosis not present

## 2019-10-20 ENCOUNTER — Other Ambulatory Visit: Payer: Self-pay

## 2019-10-20 ENCOUNTER — Ambulatory Visit
Admission: RE | Admit: 2019-10-20 | Discharge: 2019-10-20 | Disposition: A | Discharge status: Home / Self Care | Payer: Medicare Other | Source: Ambulatory Visit | Attending: Radiation Oncology | Admitting: Radiation Oncology

## 2019-10-20 DIAGNOSIS — C61 Malignant neoplasm of prostate: Secondary | ICD-10-CM | POA: Insufficient documentation

## 2019-10-21 ENCOUNTER — Other Ambulatory Visit: Payer: Self-pay

## 2019-10-21 ENCOUNTER — Ambulatory Visit
Admission: RE | Admit: 2019-10-21 | Discharge: 2019-10-21 | Disposition: A | Discharge status: Home / Self Care | Payer: Medicare Other | Source: Ambulatory Visit | Attending: Radiation Oncology | Admitting: Radiation Oncology

## 2019-10-21 DIAGNOSIS — C61 Malignant neoplasm of prostate: Secondary | ICD-10-CM | POA: Diagnosis not present

## 2019-10-22 ENCOUNTER — Other Ambulatory Visit: Payer: Self-pay

## 2019-10-22 ENCOUNTER — Ambulatory Visit
Admission: RE | Admit: 2019-10-22 | Discharge: 2019-10-22 | Disposition: A | Discharge status: Home / Self Care | Payer: Medicare Other | Source: Ambulatory Visit | Attending: Radiation Oncology | Admitting: Radiation Oncology

## 2019-10-22 DIAGNOSIS — C61 Malignant neoplasm of prostate: Secondary | ICD-10-CM | POA: Diagnosis not present

## 2019-10-23 ENCOUNTER — Ambulatory Visit
Admission: RE | Admit: 2019-10-23 | Discharge: 2019-10-23 | Disposition: A | Discharge status: Home / Self Care | Payer: Medicare Other | Source: Ambulatory Visit | Attending: Radiation Oncology | Admitting: Radiation Oncology

## 2019-10-23 ENCOUNTER — Other Ambulatory Visit: Payer: Self-pay

## 2019-10-23 DIAGNOSIS — C61 Malignant neoplasm of prostate: Secondary | ICD-10-CM | POA: Diagnosis not present

## 2019-10-26 ENCOUNTER — Other Ambulatory Visit: Payer: Self-pay

## 2019-10-26 ENCOUNTER — Ambulatory Visit
Admission: RE | Admit: 2019-10-26 | Discharge: 2019-10-26 | Disposition: A | Discharge status: Home / Self Care | Payer: Medicare Other | Source: Ambulatory Visit | Attending: Radiation Oncology | Admitting: Radiation Oncology

## 2019-10-26 DIAGNOSIS — C61 Malignant neoplasm of prostate: Secondary | ICD-10-CM | POA: Diagnosis not present

## 2019-10-27 ENCOUNTER — Other Ambulatory Visit: Payer: Self-pay

## 2019-10-27 ENCOUNTER — Ambulatory Visit
Admission: RE | Admit: 2019-10-27 | Discharge: 2019-10-27 | Disposition: A | Discharge status: Home / Self Care | Payer: Medicare Other | Source: Ambulatory Visit | Attending: Radiation Oncology | Admitting: Radiation Oncology

## 2019-10-27 DIAGNOSIS — C61 Malignant neoplasm of prostate: Secondary | ICD-10-CM | POA: Diagnosis not present

## 2019-10-28 ENCOUNTER — Ambulatory Visit
Admission: RE | Admit: 2019-10-28 | Discharge: 2019-10-28 | Disposition: A | Discharge status: Home / Self Care | Payer: Medicare Other | Source: Ambulatory Visit | Attending: Radiation Oncology | Admitting: Radiation Oncology

## 2019-10-28 ENCOUNTER — Other Ambulatory Visit: Payer: Self-pay

## 2019-10-28 DIAGNOSIS — C61 Malignant neoplasm of prostate: Secondary | ICD-10-CM | POA: Diagnosis not present

## 2019-10-29 ENCOUNTER — Other Ambulatory Visit: Payer: Self-pay

## 2019-10-29 ENCOUNTER — Ambulatory Visit
Admission: RE | Admit: 2019-10-29 | Discharge: 2019-10-29 | Disposition: A | Discharge status: Home / Self Care | Payer: Medicare Other | Source: Ambulatory Visit | Attending: Radiation Oncology | Admitting: Radiation Oncology

## 2019-10-29 DIAGNOSIS — C61 Malignant neoplasm of prostate: Secondary | ICD-10-CM | POA: Diagnosis not present

## 2019-10-30 ENCOUNTER — Ambulatory Visit
Admission: RE | Admit: 2019-10-30 | Discharge: 2019-10-30 | Disposition: A | Discharge status: Home / Self Care | Payer: Medicare Other | Source: Ambulatory Visit | Attending: Radiation Oncology | Admitting: Radiation Oncology

## 2019-10-30 ENCOUNTER — Other Ambulatory Visit: Payer: Self-pay

## 2019-10-30 DIAGNOSIS — C61 Malignant neoplasm of prostate: Secondary | ICD-10-CM | POA: Diagnosis not present

## 2019-11-02 ENCOUNTER — Ambulatory Visit
Admission: RE | Admit: 2019-11-02 | Discharge: 2019-11-02 | Disposition: A | Discharge status: Home / Self Care | Payer: Medicare Other | Source: Ambulatory Visit | Attending: Radiation Oncology | Admitting: Radiation Oncology

## 2019-11-02 ENCOUNTER — Other Ambulatory Visit: Payer: Self-pay

## 2019-11-02 DIAGNOSIS — C61 Malignant neoplasm of prostate: Secondary | ICD-10-CM | POA: Diagnosis not present

## 2019-11-03 ENCOUNTER — Ambulatory Visit
Admission: RE | Admit: 2019-11-03 | Discharge: 2019-11-03 | Disposition: A | Discharge status: Home / Self Care | Payer: Medicare Other | Source: Ambulatory Visit | Attending: Radiation Oncology | Admitting: Radiation Oncology

## 2019-11-03 ENCOUNTER — Other Ambulatory Visit: Payer: Self-pay

## 2019-11-03 DIAGNOSIS — C61 Malignant neoplasm of prostate: Secondary | ICD-10-CM | POA: Diagnosis not present

## 2019-11-04 ENCOUNTER — Other Ambulatory Visit: Payer: Self-pay

## 2019-11-04 ENCOUNTER — Ambulatory Visit
Admission: RE | Admit: 2019-11-04 | Discharge: 2019-11-04 | Disposition: A | Discharge status: Home / Self Care | Payer: Medicare Other | Source: Ambulatory Visit | Attending: Radiation Oncology | Admitting: Radiation Oncology

## 2019-11-04 DIAGNOSIS — C61 Malignant neoplasm of prostate: Secondary | ICD-10-CM | POA: Diagnosis not present

## 2019-11-05 ENCOUNTER — Other Ambulatory Visit: Payer: Self-pay

## 2019-11-05 ENCOUNTER — Ambulatory Visit
Admission: RE | Admit: 2019-11-05 | Discharge: 2019-11-05 | Disposition: A | Discharge status: Home / Self Care | Payer: Medicare Other | Source: Ambulatory Visit | Attending: Radiation Oncology | Admitting: Radiation Oncology

## 2019-11-05 DIAGNOSIS — C61 Malignant neoplasm of prostate: Secondary | ICD-10-CM | POA: Diagnosis not present

## 2019-11-06 ENCOUNTER — Ambulatory Visit
Admission: RE | Admit: 2019-11-06 | Discharge: 2019-11-06 | Disposition: A | Discharge status: Home / Self Care | Payer: Medicare Other | Source: Ambulatory Visit | Attending: Radiation Oncology | Admitting: Radiation Oncology

## 2019-11-06 ENCOUNTER — Other Ambulatory Visit: Payer: Self-pay

## 2019-11-06 DIAGNOSIS — C61 Malignant neoplasm of prostate: Secondary | ICD-10-CM | POA: Diagnosis not present

## 2019-11-09 ENCOUNTER — Ambulatory Visit
Admission: RE | Admit: 2019-11-09 | Discharge: 2019-11-09 | Disposition: A | Discharge status: Home / Self Care | Payer: Medicare Other | Source: Ambulatory Visit | Attending: Radiation Oncology | Admitting: Radiation Oncology

## 2019-11-09 ENCOUNTER — Other Ambulatory Visit: Payer: Self-pay

## 2019-11-09 DIAGNOSIS — C61 Malignant neoplasm of prostate: Secondary | ICD-10-CM | POA: Diagnosis not present

## 2019-11-10 ENCOUNTER — Ambulatory Visit
Admission: RE | Admit: 2019-11-10 | Discharge: 2019-11-10 | Disposition: A | Discharge status: Home / Self Care | Payer: Medicare Other | Source: Ambulatory Visit | Attending: Radiation Oncology | Admitting: Radiation Oncology

## 2019-11-10 ENCOUNTER — Other Ambulatory Visit: Payer: Self-pay

## 2019-11-10 DIAGNOSIS — C61 Malignant neoplasm of prostate: Secondary | ICD-10-CM | POA: Diagnosis not present

## 2019-11-11 ENCOUNTER — Ambulatory Visit
Admission: RE | Admit: 2019-11-11 | Discharge: 2019-11-11 | Disposition: A | Discharge status: Home / Self Care | Payer: Medicare Other | Source: Ambulatory Visit | Attending: Radiation Oncology | Admitting: Radiation Oncology

## 2019-11-11 ENCOUNTER — Other Ambulatory Visit: Payer: Self-pay

## 2019-11-11 DIAGNOSIS — C61 Malignant neoplasm of prostate: Secondary | ICD-10-CM | POA: Diagnosis not present

## 2019-11-12 ENCOUNTER — Other Ambulatory Visit: Payer: Self-pay

## 2019-11-12 ENCOUNTER — Ambulatory Visit
Admission: RE | Admit: 2019-11-12 | Discharge: 2019-11-12 | Disposition: A | Discharge status: Home / Self Care | Payer: Medicare Other | Source: Ambulatory Visit | Attending: Radiation Oncology | Admitting: Radiation Oncology

## 2019-11-12 DIAGNOSIS — C61 Malignant neoplasm of prostate: Secondary | ICD-10-CM | POA: Diagnosis not present

## 2019-11-13 ENCOUNTER — Other Ambulatory Visit: Payer: Self-pay

## 2019-11-13 ENCOUNTER — Ambulatory Visit
Admission: RE | Admit: 2019-11-13 | Discharge: 2019-11-13 | Disposition: A | Discharge status: Home / Self Care | Payer: Medicare Other | Source: Ambulatory Visit | Attending: Radiation Oncology | Admitting: Radiation Oncology

## 2019-11-13 DIAGNOSIS — C61 Malignant neoplasm of prostate: Secondary | ICD-10-CM | POA: Diagnosis not present

## 2019-11-16 ENCOUNTER — Other Ambulatory Visit: Payer: Self-pay

## 2019-11-16 ENCOUNTER — Ambulatory Visit
Admission: RE | Admit: 2019-11-16 | Discharge: 2019-11-16 | Disposition: A | Discharge status: Home / Self Care | Payer: Medicare Other | Source: Ambulatory Visit | Attending: Radiation Oncology | Admitting: Radiation Oncology

## 2019-11-16 DIAGNOSIS — C61 Malignant neoplasm of prostate: Secondary | ICD-10-CM | POA: Diagnosis not present

## 2019-11-17 ENCOUNTER — Ambulatory Visit
Admission: RE | Admit: 2019-11-17 | Discharge: 2019-11-17 | Disposition: A | Discharge status: Home / Self Care | Payer: Medicare Other | Source: Ambulatory Visit | Attending: Radiation Oncology | Admitting: Radiation Oncology

## 2019-11-17 ENCOUNTER — Other Ambulatory Visit: Payer: Self-pay

## 2019-11-17 DIAGNOSIS — C61 Malignant neoplasm of prostate: Secondary | ICD-10-CM | POA: Diagnosis not present

## 2019-11-18 ENCOUNTER — Other Ambulatory Visit: Payer: Self-pay

## 2019-11-18 ENCOUNTER — Ambulatory Visit
Admission: RE | Admit: 2019-11-18 | Discharge: 2019-11-18 | Disposition: A | Discharge status: Home / Self Care | Payer: Medicare Other | Source: Ambulatory Visit | Attending: Radiation Oncology | Admitting: Radiation Oncology

## 2019-11-18 DIAGNOSIS — C61 Malignant neoplasm of prostate: Secondary | ICD-10-CM | POA: Diagnosis not present

## 2019-11-19 ENCOUNTER — Other Ambulatory Visit: Payer: Self-pay

## 2019-11-19 ENCOUNTER — Encounter: Payer: Self-pay | Admitting: Radiation Oncology

## 2019-11-19 ENCOUNTER — Ambulatory Visit
Admission: RE | Admit: 2019-11-19 | Discharge: 2019-11-19 | Disposition: A | Discharge status: Home / Self Care | Payer: Medicare Other | Source: Ambulatory Visit | Attending: Radiation Oncology | Admitting: Radiation Oncology

## 2019-11-19 DIAGNOSIS — C61 Malignant neoplasm of prostate: Secondary | ICD-10-CM | POA: Insufficient documentation

## 2019-12-22 ENCOUNTER — Telehealth: Payer: Self-pay

## 2019-12-22 ENCOUNTER — Encounter: Payer: Self-pay | Admitting: Urology

## 2019-12-22 NOTE — Progress Notes (Signed)
Radiation Oncology         [phone]) [phone] ________________________________  Name: Benjamin Davila MRN: [id number]  Date: 12/23/2019  DOB: 1940-10-12  Post Treatment Note  CC: Benjamin Loader, FNP  Benjamin Aloe, MD  Diagnosis:   79 y.o. 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  Interval Since Last Radiation:  5 weeks (Concurrent with LT-ADT- 6 month Eligard started 07/16/19) 09/24/19 - 11/19/2019:  1. The prostate, seminal vesicles, and pelvic lymph nodes were initially treated to 45 Gy in 25 fractions of 1.8 Gy  2. The prostate only was boosted to 75 Gy with 15 additional fractions of 2.0 Gy   Narrative:  I spoke with the patient to conduct his routine scheduled 1 month follow up visit via telephone to spare the patient unnecessary potential exposure in the healthcare setting during the current COVID-19 pandemic.  The patient was notified in advance and gave permission to proceed with this visit format. He tolerated his recent radiation therapy relatively well with only minor urinary issues with increased frequency, urgency, hesitancy, weak stream, occasional mild dysuria and nocturia 3-4x/nihgt.  He also experienced mild fatigue but denied diarrhea or abdominal pain.  He continued to tolerate ADT well throughout his radiation treatments.         On review of systems, the patient states that he is doing well in general.  His current IPSS score is 28, indicating severe urinary symptoms with increased frequency, urgency, hesitancy, weak stream, intermittency, straining to void and nocturia 4-5 times per night.  He is also having mild dysuria at the start of his stream.  He continues to tolerate the ADT relatively well aside from more frequent and intense hot flashes over the last 3 to 4 weeks.  He denies any significant fatigue and reports a healthy appetite.  Overall, he is quite pleased with his progress to date.  ALLERGIES:  is allergic  to lisinopril.  Meds: Current Outpatient Medications  Medication Sig Dispense Refill  . atorvastatin (LIPITOR) 20 MG tablet Take 1 tablet by mouth daily.    . diazepam (VALIUM) 5 MG tablet Take 1 tablet by mouth daily.    Marland Kitchen LANTUS SOLOSTAR 100 UNIT/ML Solostar Pen     . losartan (COZAAR) 100 MG tablet Take 1 tablet by mouth daily.    . metFORMIN (GLUCOPHAGE) 500 MG tablet Take 1 tablet by mouth 2 (two) times a day.    . triamcinolone cream (KENALOG) 0.1 %      No current facility-administered medications for this encounter.    Physical Findings:  vitals were not taken for this visit.   /Unable to assess due to telephone follow up visit format.  Lab Findings: No results found for: WBC, HGB, HCT, MCV, PLT   Radiographic Findings: No results found.  Impression/Plan: 1. 79 y.o. 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 He will continue to follow up with urology for ongoing PSA determinations and has an appointment scheduled with Dr. Junious Davila at 10:15 am on 01/20/2020 when he will be due for his next Eligard injection. He understands what to expect with regards to PSA monitoring going forward. I will look forward to following his response to treatment via correspondence with urology, and would be happy to continue to participate in his care if clinically indicated. I talked to the patient about what to expect in the future, including his risk for  erectile dysfunction and rectal bleeding.  He has a prescription of Flomax at home but has not been taking this recently.  We discussed resuming the Flomax daily, taking it after supper in the evenings to see if this will help better manage his LUTS.  He will report his progress to Dr. Junious Davila at his upcoming follow-up visit.  I encouraged him to call or return to the office if he has any questions regarding his previous radiation or possible radiation side effects. He was comfortable with this  plan and will follow up as needed.  Today, a comprehensive survivorship care plan and treatment summary was reviewed with the patient today detailing his prostate cancer diagnosis, treatment course, potential late/long-term effects of treatment, appropriate follow-up care with recommendations for the future, and patient education resources.  A copy of this summary, along with a letter will be sent to the patient's primary care provider via fax after today's visit.  2. Cancer screening:  Due to Benjamin Davila's history and his age, he should receive screening for skin cancers and colon cancer.  The information and recommendations are listed on the patient's comprehensive care plan/treatment summary and were reviewed in detail with the patient.     3. Health maintenance and wellness promotion: Benjamin Davila was encouraged to consume 5-7 servings of fruits and vegetables per day. He was provided a copy of the "Nutrition Rainbow" handout, as well as the handout "Take Control of Your Health and [place]" from the [employer].  He was also encouraged to engage in moderate to vigorous exercise for 30 minutes per day most days of the week. Information was provided regarding the [employer] fitness program, which is designed for cancer survivors to help them become more physically fit after cancer treatments. We discussed that a healthy BMI is 18.5-24.9 and that maintaining a healthy weight reduces risk of cancer recurrences.  He was instructed to limit his alcohol consumption and continue to abstain from tobacco use.  Lastly, he was encouraged to use sunscreen and wear protective clothing when in the sun.     4. Support services/counseling: It is not uncommon for this period of the patient's cancer care trajectory to be one of many emotions and stressors.  Benjamin Davila was encouraged to take advantage of our many support services programs, support groups, and/or counseling in coping with his  new life as a cancer survivor after completing anti-cancer treatment.  He was offered support today through active listening and expressive supportive counseling.  He was given information regarding our available services and encouraged to contact me with any questions or for help enrolling in any of our support group/programs.       Nicholos Johns, PA-C

## 2019-12-22 NOTE — Progress Notes (Signed)
Patient has telephone follow-up appointment with Ashlyn Bruning PA. Patient denies having fatigue. Patient states nocturia 4-5 times per night. Patient states mild dysuria. Patient states having a weak urine stream. Patient states that he is not emptying his bladder completely and returning to the bathroom 10-15 minutes later. Patient states urgency all the time. Patient states hesitancy and straining. Patient denies having any leakage of urine. Patient states that he has had increase hot flashes. Patient states that he has a follow up appointment with [facility] on September 1st.

## 2019-12-22 NOTE — Telephone Encounter (Signed)
Spoke with patient in regards to follow-up appointment with Freeman Caldron PA on 12/23/19 at 2:00pm. Patient verbalized understanding of appointment date and time. Reviewed meaningful use, AUA and prostate questions. TM

## 2019-12-22 NOTE — Telephone Encounter (Signed)
Left voicemail message for patient to call back in regards to telephone follow-up with Ashlyn Bruning PA on 12/23/19 at 2:00pm. Called to review meaningful use, AUA and prostate questions. TM

## 2019-12-23 ENCOUNTER — Other Ambulatory Visit: Payer: Self-pay

## 2019-12-23 ENCOUNTER — Ambulatory Visit
Admission: RE | Admit: 2019-12-23 | Discharge: 2019-12-23 | Disposition: A | Discharge status: Home / Self Care | Payer: Medicare Other | Source: Ambulatory Visit | Attending: Urology | Admitting: Urology

## 2019-12-23 DIAGNOSIS — C61 Malignant neoplasm of prostate: Secondary | ICD-10-CM

## 2020-05-21 NOTE — Progress Notes (Signed)
  Radiation Oncology         [phone]) [phone] ________________________________  Name: Benjamin Davila MRN: [id number]  Date: 11/19/2019  DOB: 01-Feb-1941  End of Treatment Note  Diagnosis:   80 y.o. 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    Indication for treatment:  Curative, Definitive Radiotherapy       Radiation treatment dates:   09/24/19-11/19/19  Site/dose:  1. The prostate, seminal vesicles, and pelvic lymph nodes were initially treated to 45 Gy in 25 fractions of 1.8 Gy  2. The prostate only was boosted to 75 Gy with 15 additional fractions of 2.0 Gy   Beams/energy:  1. The prostate, seminal vesicles, and pelvic lymph nodes were initially treated using VMAT intensity modulated radiotherapy delivering 6 megavolt photons. Image guidance was performed with CB-CT studies prior to each fraction. He was immobilized with a body fix lower extremity mold.  2. the prostate only was boosted using VMAT intensity modulated radiotherapy delivering 6 megavolt photons. Image guidance was performed with CB-CT studies prior to each fraction. He was immobilized with a body fix lower extremity mold.  Narrative: The patient tolerated radiation treatment relatively well.   The patient experienced some minor urinary irritation and modest fatigue.    Plan: The patient has completed radiation treatment. He will return to radiation oncology clinic for routine followup in one month. I advised him to call or return sooner if he has any questions or concerns related to his recovery or treatment. ________________________________  Artist Pais. Kathrynn Running, M.D.

## 2020-06-17 DIAGNOSIS — M961 Postlaminectomy syndrome, not elsewhere classified: Secondary | ICD-10-CM | POA: Insufficient documentation

## 2020-06-17 DIAGNOSIS — M5416 Radiculopathy, lumbar region: Secondary | ICD-10-CM | POA: Insufficient documentation

## 2020-06-17 HISTORY — DX: Postlaminectomy syndrome, not elsewhere classified: M96.1

## 2020-06-17 HISTORY — DX: Radiculopathy, lumbar region: M54.16

## 2020-07-12 ENCOUNTER — Other Ambulatory Visit: Payer: Self-pay

## 2020-07-12 DIAGNOSIS — K754 Autoimmune hepatitis: Secondary | ICD-10-CM | POA: Insufficient documentation

## 2020-07-12 DIAGNOSIS — Z794 Long term (current) use of insulin: Secondary | ICD-10-CM | POA: Insufficient documentation

## 2020-07-12 DIAGNOSIS — E1140 Type 2 diabetes mellitus with diabetic neuropathy, unspecified: Secondary | ICD-10-CM

## 2020-07-12 DIAGNOSIS — Z862 Personal history of diseases of the blood and blood-forming organs and certain disorders involving the immune mechanism: Secondary | ICD-10-CM | POA: Insufficient documentation

## 2020-07-12 DIAGNOSIS — L2082 Flexural eczema: Secondary | ICD-10-CM | POA: Insufficient documentation

## 2020-07-12 DIAGNOSIS — C61 Malignant neoplasm of prostate: Secondary | ICD-10-CM | POA: Insufficient documentation

## 2020-07-12 DIAGNOSIS — I499 Cardiac arrhythmia, unspecified: Secondary | ICD-10-CM | POA: Insufficient documentation

## 2020-07-12 DIAGNOSIS — E785 Hyperlipidemia, unspecified: Secondary | ICD-10-CM

## 2020-07-12 DIAGNOSIS — E1169 Type 2 diabetes mellitus with other specified complication: Secondary | ICD-10-CM

## 2020-07-12 DIAGNOSIS — F39 Unspecified mood [affective] disorder: Secondary | ICD-10-CM | POA: Insufficient documentation

## 2020-07-12 DIAGNOSIS — Z8719 Personal history of other diseases of the digestive system: Secondary | ICD-10-CM | POA: Insufficient documentation

## 2020-07-12 DIAGNOSIS — N400 Enlarged prostate without lower urinary tract symptoms: Secondary | ICD-10-CM | POA: Insufficient documentation

## 2020-07-12 DIAGNOSIS — R011 Cardiac murmur, unspecified: Secondary | ICD-10-CM | POA: Insufficient documentation

## 2020-07-12 HISTORY — DX: Personal history of diseases of the blood and blood-forming organs and certain disorders involving the immune mechanism: Z86.2

## 2020-07-12 HISTORY — DX: Long term (current) use of insulin: Z79.4

## 2020-07-12 HISTORY — DX: Hyperlipidemia, unspecified: E78.5

## 2020-07-12 HISTORY — DX: Cardiac arrhythmia, unspecified: I49.9

## 2020-07-12 HISTORY — DX: Type 2 diabetes mellitus with diabetic neuropathy, unspecified: E11.40

## 2020-07-12 HISTORY — DX: Personal history of other diseases of the digestive system: Z87.19

## 2020-07-12 HISTORY — DX: Type 2 diabetes mellitus with other specified complication: E11.69

## 2020-07-12 HISTORY — DX: Flexural eczema: L20.82

## 2020-07-12 HISTORY — DX: Unspecified mood (affective) disorder: F39

## 2020-07-12 HISTORY — DX: Cardiac murmur, unspecified: R01.1

## 2020-07-12 HISTORY — DX: Benign prostatic hyperplasia without lower urinary tract symptoms: N40.0

## 2020-07-12 HISTORY — DX: Autoimmune hepatitis: K75.4

## 2020-07-14 ENCOUNTER — Other Ambulatory Visit: Payer: Self-pay

## 2020-07-14 ENCOUNTER — Ambulatory Visit (INDEPENDENT_AMBULATORY_CARE_PROVIDER_SITE_OTHER): Payer: Medicare Other | Admitting: Cardiology

## 2020-07-14 ENCOUNTER — Encounter: Payer: Self-pay | Admitting: Cardiology

## 2020-07-14 VITALS — BP 158/88 | HR 101 | Ht 70.0 in | Wt 185.0 lb

## 2020-07-14 DIAGNOSIS — I351 Nonrheumatic aortic (valve) insufficiency: Secondary | ICD-10-CM | POA: Insufficient documentation

## 2020-07-14 DIAGNOSIS — E088 Diabetes mellitus due to underlying condition with unspecified complications: Secondary | ICD-10-CM

## 2020-07-14 DIAGNOSIS — E781 Pure hyperglyceridemia: Secondary | ICD-10-CM

## 2020-07-14 DIAGNOSIS — I350 Nonrheumatic aortic (valve) stenosis: Secondary | ICD-10-CM | POA: Diagnosis not present

## 2020-07-14 DIAGNOSIS — Z8719 Personal history of other diseases of the digestive system: Secondary | ICD-10-CM

## 2020-07-14 DIAGNOSIS — I10 Essential (primary) hypertension: Secondary | ICD-10-CM

## 2020-07-14 DIAGNOSIS — C61 Malignant neoplasm of prostate: Secondary | ICD-10-CM

## 2020-07-14 HISTORY — DX: Nonrheumatic aortic (valve) insufficiency: I35.1

## 2020-07-14 NOTE — Patient Instructions (Signed)
Medication Instructions:  Your physician recommends that you continue on your current medications as directed. Please refer to the Current Medication list given to you today.  *If you need a refill on your cardiac medications before your next appointment, please call your pharmacy*   Lab Work: None If you have labs (blood work) drawn today and your tests are completely normal, you will receive your results only by: Marland Kitchen MyChart Message (if you have MyChart) OR . A paper copy in the mail If you have any lab test that is abnormal or we need to change your treatment, we will call you to review the results.   Testing/Procedures: Your physician has requested that you have an echocardiogram. Echocardiography is a painless test that uses sound waves to create images of your heart. It provides your doctor with information about the size and shape of your heart and how well your heart's chambers and valves are working. This procedure takes approximately one hour. There are no restrictions for this procedure.     Follow-Up: At [facility], you and your health needs are our priority.  As part of our continuing mission to provide you with exceptional heart care, we have created designated Provider Care Teams.  These Care Teams include your primary Cardiologist (physician) and Advanced Practice Providers (APPs -  Physician Assistants and Nurse Practitioners) who all work together to provide you with the care you need, when you need it.  We recommend signing up for the patient portal called "MyChart".  Sign up information is provided on this After Visit Summary.  MyChart is used to connect with patients for Virtual Visits (Telemedicine).  Patients are able to view lab/test results, encounter notes, upcoming appointments, etc.  Non-urgent messages can be sent to your provider as well.   To learn more about what you can do with MyChart, go to [email or web address].    Your next appointment:   6  month(s)  The format for your next appointment:   In Person  Provider:   Jyl Heinz, MD   Other Instructions   Echocardiogram An echocardiogram is a test that uses sound waves (ultrasound) to produce images of the heart. Images from an echocardiogram can provide important information about:  Heart size and shape.  The size and thickness and movement of your heart's walls.  Heart muscle function and strength.  Heart valve function or if you have stenosis. Stenosis is when the heart valves are too narrow.  If blood is flowing backward through the heart valves (regurgitation).  A tumor or infectious growth around the heart valves.  Areas of heart muscle that are not working well because of poor blood flow or injury from a heart attack.  Aneurysm detection. An aneurysm is a weak or damaged part of an artery wall. The wall bulges out from the normal force of blood pumping through the body. Tell a health care provider about:  Any allergies you have.  All medicines you are taking, including vitamins, herbs, eye drops, creams, and over-the-counter medicines.  Any blood disorders you have.  Any surgeries you have had.  Any medical conditions you have.  Whether you are pregnant or may be pregnant. What are the risks? Generally, this is a safe test. However, problems may occur, including an allergic reaction to dye (contrast) that may be used during the test. What happens before the test? No specific preparation is needed. You may eat and drink normally. What happens during the test?  You will take off your  clothes from the waist up and put on a hospital gown.  Electrodes or electrocardiogram (ECG)patches may be placed on your chest. The electrodes or patches are then connected to a device that monitors your heart rate and rhythm.  You will lie down on a table for an ultrasound exam. A gel will be applied to your chest to help sound waves pass through your skin.  A  handheld device, called a transducer, will be pressed against your chest and moved over your heart. The transducer produces sound waves that travel to your heart and bounce back (or "echo" back) to the transducer. These sound waves will be captured in real-time and changed into images of your heart that can be viewed on a video monitor. The images will be recorded on a computer and reviewed by your health care provider.  You may be asked to change positions or hold your breath for a short time. This makes it easier to get different views or better views of your heart.  In some cases, you may receive contrast through an IV in one of your veins. This can improve the quality of the pictures from your heart. The procedure may vary among health care providers and hospitals.   What can I expect after the test? You may return to your normal, everyday life, including diet, activities, and medicines, unless your health care provider tells you not to do that. Follow these instructions at home:  It is up to you to get the results of your test. Ask your health care provider, or the department that is doing the test, when your results will be ready.  Keep all follow-up visits. This is important. Summary  An echocardiogram is a test that uses sound waves (ultrasound) to produce images of the heart.  Images from an echocardiogram can provide important information about the size and shape of your heart, heart muscle function, heart valve function, and other possible heart problems.  You do not need to do anything to prepare before this test. You may eat and drink normally.  After the echocardiogram is completed, you may return to your normal, everyday life, unless your health care provider tells you not to do that. This information is not intended to replace advice given to you by your health care provider. Make sure you discuss any questions you have with your health care provider. Document Revised:  12/29/2019 Document Reviewed: 12/29/2019 Elsevier Patient Education  2021 Elsevier Inc.   

## 2020-07-14 NOTE — Progress Notes (Signed)
Cardiology Office Note:    Date:  07/14/2020   ID:  Benjamin Davila, Benjamin Davila 09/11/1940, MRN [id number]  PCP:  Kristen Loader, FNP  Cardiologist:  Jenean Lindau, MD   Referring MD: Kristen Loader, FNP    ASSESSMENT:    1. Aortic stenosis, moderate   2. Essential hypertension   3. Pure hypertriglyceridemia   4. Diabetes mellitus due to underlying condition with unspecified complications ([place])   5. Malignant neoplasm of prostate ([place])   6. History of pancreatitis   7. Aortic valve insufficiency, etiology of cardiac valve disease unspecified    PLAN:    In order of problems listed above:  1. Primary prevention stressed with the patient.  Importance of compliance with diet medication stressed any vocalized understanding. 2. Mild to moderate aortic stenosis and aortic regurgitation: Medical management at this time.  As mentioned below patient is asymptomatic and active gentleman.  Echocardiogram will be done to assess the progress. 3. Essential hypertension: Blood pressure is mildly elevated.  I have told him to keep a track of his blood pressures at home and send me a log in the next 2 weeks.  He promises to do so.  Diet was emphasized.  Salt intake issues were discussed. 4. Diabetes mellitus: Managed by primary care.  I am unaware of her last hemoglobin A1c but the one done several months ago was elevated and I cautioned him about it. 5. Mixed dyslipidemia: Diet was emphasized.  Lipids were reviewed he seems to be doing well on this front. 6. Patient will be seen in follow-up appointment in 6 months or earlier if the patient has any concerns    Medication Adjustments/Labs and Tests Ordered: Current medicines are reviewed at length with the patient today.  Concerns regarding medicines are outlined above.  Orders Placed This Encounter  Procedures  . EKG 12-Lead  . ECHOCARDIOGRAM COMPLETE   No orders of the defined types were placed in this encounter.    No chief complaint on  file.    History of Present Illness:    Benjamin Davila is a 80 y.o. male.  Patient has past medical history of mild to moderate aortic stenosis, aortic regurgitation, essential hypertension, dyslipidemia and diabetes mellitus.  He has had history of pancreatitis.  Last year he underwent treatment for prostate cancer with radiation and doing well.  No chest pain orthopnea or PND.  No dizziness syncopal spells or any dyspnea on exertion.  At the time of my evaluation, the patient is alert awake oriented and in no distress.  Past Medical History:  Diagnosis Date  . Aortic stenosis, moderate 03/17/2019  . 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 Benign prostatic hyperplasia without lower urinary tract symptoms 07/12/2020  . Cardiac murmur 12/05/2018  . Diabetes mellitus due to underlying condition with unspecified complications ([place]) 08/27/8117  . Essential hypertension 12/05/2018  . 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 History of pancreatitis 07/12/2020  . History of pancytopenia 07/12/2020  . Hyperlipidemia, unspecified 07/12/2020  . Irregular heart beat 07/12/2020  . Long term (current) use of insulin ([place]) 07/12/2020  . Malignant neoplasm of prostate ([place]) 06/24/2019  . Mood disorder ([place]) 07/12/2020  . Prostate cancer ([place])   . Systolic murmur 1/47/8295  . Type 2 diabetes mellitus with other specified complication ([place]) 11/08/3084    Past Surgical History:  Procedure Laterality Date  . NO PAST SURGERIES      Current Medications: Current Meds  Medication Sig  . atorvastatin (LIPITOR) 20 MG tablet  Take 1 tablet by mouth daily.  . insulin glargine (LANTUS SOLOSTAR) 100 UNIT/ML Solostar Pen Inject 24 Units into the skin daily.  Marland Kitchen losartan (COZAAR) 50 MG tablet Take 50 mg by mouth daily.  . metFORMIN (GLUCOPHAGE) 500 MG tablet Take 1 tablet by mouth 2 (two) times a day.     Allergies:   Lisinopril   Social History   Socioeconomic History  . Marital status: Married    Spouse name: Not on file   . Number of children: Not on file  . Years of education: Not on file  . Highest education level: Not on file  Occupational History  . Not on file  Tobacco Use  . Smoking status: Never Smoker  . Smokeless tobacco: Never Used  Substance and Sexual Activity  . Alcohol use: Not on file  . Drug use: Not on file  . Sexual activity: Not on file  Other Topics Concern  . Not on file  Social History Narrative  . Not on file   Social Determinants of Health   Financial Resource Strain: Not on file  Food Insecurity: Not on file  Transportation Needs: Not on file  Physical Activity: Not on file  Stress: Not on file  Social Connections: Not on file     Family History: The patient's family history is not on file.  ROS:   Please see the history of present illness.    All other systems reviewed and are negative.  EKGs/Labs/Other Studies Reviewed:    The following studies were reviewed today: IMPRESSIONS    1. The left ventricle has normal systolic function with an ejection  fraction of 60-65%. The cavity size was normal. There is mild concentric  left ventricular hypertrophy. Left ventricular diastolic Doppler  parameters are consistent with impaired  relaxation. Elevated mean left atrial pressure No evidence of left  ventricular regional wall motion abnormalities.  2. The right ventricle has normal systolic function. The cavity was  normal. There is no increase in right ventricular wall thickness.  3. The mitral valve is degenerative. There is mild mitral annular  calcification present. No evidence of mitral valve stenosis.  4. The aortic valve is tricuspid. Moderate thickening of the aortic  valve. Mild calcification of the aortic valve. Aortic valve regurgitation  is mild to moderate by color flow Doppler. Mild-moderate stenosis of the  aortic valve.  5. The aorta is normal in size and structure.  6. The aortic root, ascending aorta and aortic arch are normal in size   and structure.    Recent Labs: No results found for requested labs within last 8760 hours.  Recent Lipid Panel No results found for: CHOL, TRIG, HDL, CHOLHDL, VLDL, LDLCALC, LDLDIRECT  Physical Exam:    VS:  BP (!) 158/88   Pulse (!) 101   Ht 5\' 10"  (1.778 m)   Wt 185 lb 0.6 oz (83.9 kg)   SpO2 98%   BMI 26.55 kg/m     Wt Readings from Last 3 Encounters:  07/14/20 185 lb 0.6 oz (83.9 kg)  03/17/19 193 lb 0.6 oz (87.6 kg)  12/05/18 189 lb (85.7 kg)     GEN: Patient is in no acute distress HEENT: Normal NECK: No JVD; No carotid bruits LYMPHATICS: No lymphadenopathy CARDIAC: Hear sounds regular, 2/6 systolic murmur at the apex. RESPIRATORY:  Clear to auscultation without rales, wheezing or rhonchi  ABDOMEN: Soft, non-tender, non-distended MUSCULOSKELETAL:  No edema; No deformity  SKIN: Warm and dry NEUROLOGIC:  Alert and  oriented x 3 PSYCHIATRIC:  Normal affect   Signed, Jenean Lindau, MD  07/14/2020 2:59 PM    [place]

## 2020-07-28 DIAGNOSIS — M47816 Spondylosis without myelopathy or radiculopathy, lumbar region: Secondary | ICD-10-CM | POA: Insufficient documentation

## 2020-07-28 DIAGNOSIS — M48061 Spinal stenosis, lumbar region without neurogenic claudication: Secondary | ICD-10-CM | POA: Insufficient documentation

## 2020-07-28 HISTORY — DX: Spinal stenosis, lumbar region without neurogenic claudication: M48.061

## 2020-07-28 HISTORY — DX: Spondylosis without myelopathy or radiculopathy, lumbar region: M47.816

## 2020-08-12 ENCOUNTER — Ambulatory Visit (HOSPITAL_BASED_OUTPATIENT_CLINIC_OR_DEPARTMENT_OTHER)
Admission: RE | Admit: 2020-08-12 | Discharge: 2020-08-12 | Disposition: A | Discharge status: Home / Self Care | Payer: Medicare Other | Source: Ambulatory Visit | Attending: Cardiology | Admitting: Cardiology

## 2020-08-12 ENCOUNTER — Other Ambulatory Visit: Payer: Self-pay

## 2020-08-12 DIAGNOSIS — I350 Nonrheumatic aortic (valve) stenosis: Secondary | ICD-10-CM | POA: Insufficient documentation

## 2020-08-12 LAB — ECHOCARDIOGRAM COMPLETE
AR max vel: 1.3 cm2
AV Area VTI: 1.38 cm2
AV Area mean vel: 1.36 cm2
AV Mean grad: 20 mmHg
AV Peak grad: 36.7 mmHg
Ao pk vel: 3.03 m/s
Area-P 1/2: 2 cm2
P 1/2 time: 406 msec
S' Lateral: 2.47 cm

## 2020-10-21 ENCOUNTER — Other Ambulatory Visit: Payer: Self-pay

## 2020-10-21 ENCOUNTER — Encounter (HOSPITAL_BASED_OUTPATIENT_CLINIC_OR_DEPARTMENT_OTHER): Payer: Self-pay | Admitting: Physical Therapy

## 2020-10-21 ENCOUNTER — Ambulatory Visit (HOSPITAL_BASED_OUTPATIENT_CLINIC_OR_DEPARTMENT_OTHER): Payer: Medicare Other | Attending: Orthopedic Surgery | Admitting: Physical Therapy

## 2020-10-21 DIAGNOSIS — M25552 Pain in left hip: Secondary | ICD-10-CM | POA: Insufficient documentation

## 2020-10-21 DIAGNOSIS — R262 Difficulty in walking, not elsewhere classified: Secondary | ICD-10-CM | POA: Diagnosis present

## 2020-10-21 DIAGNOSIS — M6281 Muscle weakness (generalized): Secondary | ICD-10-CM | POA: Diagnosis present

## 2020-10-21 DIAGNOSIS — M545 Low back pain, unspecified: Secondary | ICD-10-CM | POA: Insufficient documentation

## 2020-10-21 DIAGNOSIS — G8929 Other chronic pain: Secondary | ICD-10-CM | POA: Diagnosis present

## 2020-10-21 NOTE — Therapy (Signed)
[place], [place], [address] Phone: [phone]   Fax:  [phone]  Physical Therapy Evaluation  Patient Details  Name: Benjamin Davila MRN: [id number] Date of Birth: 02-Mar-1941 Referring Provider (PT): Melina Schools, MD   Encounter Date: 10/21/2020   PT End of Session - 10/21/20 1545    Visit Number 1    Number of Visits 13    Date for PT Re-Evaluation 12/02/20    Authorization Type BCBS MCR    Progress Note Due on Visit 10    PT Start Time 0930    PT Stop Time 1015    PT Time Calculation (min) 45 min    Activity Tolerance Patient tolerated treatment well    Behavior During Therapy [facility] for tasks assessed/performed           Past Medical History:  Diagnosis Date  . Aortic stenosis, moderate 03/17/2019  . 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 Benign prostatic hyperplasia without lower urinary tract symptoms 07/12/2020  . Cardiac murmur 12/05/2018  . Diabetes mellitus due to underlying condition with unspecified complications ([place]) 9/47/6546  . Essential hypertension 12/05/2018  . 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 History of pancreatitis 07/12/2020  . History of pancytopenia 07/12/2020  . Hyperlipidemia, unspecified 07/12/2020  . Irregular heart beat 07/12/2020  . Long term (current) use of insulin ([place]) 07/12/2020  . Malignant neoplasm of prostate ([place]) 06/24/2019  . Mood disorder ([place]) 07/12/2020  . Prostate cancer ([place])   . Systolic murmur 09/21/5463  . Type 2 diabetes mellitus with other specified complication ([place]) 6/81/2751    Past Surgical History:  Procedure Laterality Date  . NO PAST SURGERIES      There were no vitals filed for this visit.    Subjective Assessment - 10/21/20 0936    Subjective In the last 1.5 yr had Lt lateral hip pain, did PT and injections to hip and lumbar spine with no change. I can now bend somewhat and sit. Still having Lt lateral hip pain and up into QL. Limits  functional activity. Fell bw 2x4s in celing into kitchen with Rt Leg.    How long can you walk comfortably? about 2 blocks    Patient Stated Goals reduce pain, improve function    Currently in Pain? Yes    Pain Score 4     Pain Location Back    Pain Orientation Lateral;Lower    Pain Descriptors / Indicators --   stiff, hurts   Pain Radiating Towards belt line of lower back, Rt upper thigh muscle    Aggravating Factors  walking, standing    Pain Relieving Factors sit, rest              [facility] PT Assessment - 10/21/20 0001      Assessment   Medical Diagnosis lumbar spondylosis    Referring Provider (PT) Melina Schools, MD    Onset Date/Surgical Date --   1.5 yr ago   Prior Therapy yes      Precautions   Precautions None      Restrictions   Weight Bearing Restrictions No      Balance Screen   Has the patient fallen in the past 6 months No    Has the patient had a decrease in activity level because of a fear of falling?  Yes    Is the patient reluctant to leave their home because of a fear of falling?  No      Home Environment  Living Environment Private residence    Living Arrangements Spouse/significant other      Prior Function   Level of Independence Independent      Cognition   Overall Cognitive Status Within Functional Limits for tasks assessed      Sensation   Additional Comments [facility]      Posture/Postural Control   Posture Comments takes a moment to stand, amb with slightly flexed posture and flexed knees      Palpation   Palpation comment concordant pain upon palpation to LT TFL & glut med/min                      Objective measurements completed on examination: See above findings.       [facility] Adult PT Treatment/Exercise - 10/21/20 0001      Exercises   Exercises Knee/Hip      Knee/Hip Exercises: Stretches   Passive Hamstring Stretch Limitations seated EOB    Hip Flexor Stretch Limitations mod thomas position    Piriformis Stretch  Limitations seated figure 4    Soleus Stretch Limitations standing lunge                  PT Education - 10/21/20 1557    Education Details anatomy of condition, POC, HEP, exercise form/rationale    Person(s) Educated Patient;Spouse    Methods Explanation;Demonstration;Tactile cues;Verbal cues;Handout    Comprehension Verbalized understanding;Returned demonstration;Verbal cues required;Tactile cues required;Need further instruction            PT Short Term Goals - 10/21/20 1552      PT SHORT TERM GOAL #1   Title pt will report ability to participate in pool activities without incr in lumbar and hip pain    Baseline activity incr pain at eval    Time 3    Period Weeks    Status New    Target Date 11/11/20             PT Long Term Goals - 10/21/20 1551      PT LONG TERM GOAL #1   Title pt will return to activity level as in PLOF    Baseline pt and wife report limits in their lifestyle due to pain    Time 6    Period Weeks    Status New    Target Date 12/02/20      PT LONG TERM GOAL #2   Title independent in HEP    Baseline will progress and establish as appropriate    Time 6    Period Weeks    Status New    Target Date 12/02/20      PT LONG TERM GOAL #3   Title resolution of constant Lt lateral hip pain    Baseline constant at eval    Time 6    Period Weeks    Status New    Target Date 12/02/20                  Plan - 10/21/20 1546    Clinical Impression Statement Pt presents to PT with wife c/o Lt lateral hip pain that began about 1.5 yr ago of insidious onset. He reports centralization of pain to back in last round of PT but not resolved. He was told that his MRI showed fractures in L5  and unremarkable at hip joint. Concordant pain upon palpation to TFL and glut med/min on Lt side. Discussed use of DN in the future. Also discussed  how aquatic therapy can assist in removing some compression from lumbar spine while allowing surrounding muscles  to stretch and strengthen appropriately. Will do 4 visits of aquatic therapy followed by a land re-eval where we will utilize DN if appropriate. Little time remained at the end of the evaluation to prescribe stretches but pt found to lack gross flexibility. pt will benefit from skilled PT to address deficits and reach long term functional goals.    Personal Factors and Comorbidities Comorbidity 2;Time since onset of injury/illness/exacerbation    Comorbidities h/o CA & radiation, T2 DM    Examination-Activity Limitations Squat;Stairs;Bend;Stand;Locomotion Level;Sit    Examination-Participation Restrictions Community Activity    Stability/Clinical Decision Making Evolving/Moderate complexity    Clinical Decision Making Moderate    Rehab Potential Good    PT Frequency 2x / week    PT Duration 6 weeks    PT Treatment/Interventions ADLs/Self Care Home Management;Cryotherapy;Moist Heat;Iontophoresis 4mg /ml Dexamethasone;Electrical Stimulation;Stair training;Gait training;Therapeutic activities;Therapeutic exercise;Neuromuscular re-education;Patient/family education;Passive range of motion;Manual techniques;Dry needling;Taping;Aquatic Therapy    PT Next Visit Plan in pool- quad strength, core strength, lumbar & hip stretching    PT Home Exercise Plan [email or web address]    Consulted and Agree with Plan of Care Patient;Family member/caregiver    Family Member Consulted wife           Patient will benefit from skilled therapeutic intervention in order to improve the following deficits and impairments:  Difficulty walking,Increased muscle spasms,Improper body mechanics,Decreased activity tolerance,Decreased strength,Impaired flexibility,Postural dysfunction,Pain  Visit Diagnosis: Pain in left hip - Plan: PT plan of care cert/re-cert  Muscle weakness (generalized) - Plan: PT plan of care cert/re-cert  Chronic 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 - Plan: PT plan of care cert/re-cert  Difficulty in  walking, not elsewhere classified - Plan: PT plan of care cert/re-cert     Problem List Patient Active Problem List   Diagnosis Date Noted  . Aortic regurgitation 07/14/2020  . 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 Benign prostatic hyperplasia without lower urinary tract symptoms 07/12/2020  . 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 History of pancreatitis 07/12/2020  . History of pancytopenia 07/12/2020  . Hyperlipidemia, unspecified 07/12/2020  . Irregular heart beat 07/12/2020  . Long term (current) use of insulin ([place]) 07/12/2020  . Mood disorder ([place]) 07/12/2020  . Systolic murmur 32/20/2542  . Type 2 diabetes mellitus with other specified complication ([place]) 70/62/3762  . Prostate cancer ([place])   . Malignant neoplasm of prostate ([place]) 06/24/2019  . Aortic stenosis, moderate 03/17/2019  . Cardiac murmur 12/05/2018  . Essential hypertension 12/05/2018  . Diabetes mellitus due to underlying condition with unspecified complications ([place]) 83/15/1761    Isiah Scheel C. Deiondre Harrower PT, DPT 10/21/20 5:13 PM   [facility] [address] [place], [place], [address] Phone: [phone]   Fax:  [phone]  Name: Benjamin Davila MRN: [id number] Date of Birth: [birth year]

## 2020-10-26 ENCOUNTER — Ambulatory Visit (HOSPITAL_BASED_OUTPATIENT_CLINIC_OR_DEPARTMENT_OTHER): Payer: Medicare Other | Admitting: Physical Therapy

## 2020-10-26 ENCOUNTER — Encounter (HOSPITAL_BASED_OUTPATIENT_CLINIC_OR_DEPARTMENT_OTHER): Payer: Self-pay | Admitting: Physical Therapy

## 2020-10-26 ENCOUNTER — Other Ambulatory Visit: Payer: Self-pay

## 2020-10-26 DIAGNOSIS — M545 Low back pain, unspecified: Secondary | ICD-10-CM

## 2020-10-26 DIAGNOSIS — M25552 Pain in left hip: Secondary | ICD-10-CM

## 2020-10-26 DIAGNOSIS — R262 Difficulty in walking, not elsewhere classified: Secondary | ICD-10-CM

## 2020-10-26 DIAGNOSIS — M6281 Muscle weakness (generalized): Secondary | ICD-10-CM

## 2020-10-26 NOTE — Therapy (Signed)
[place], [place], [address] Phone: [phone]   Fax:  [phone]  Physical Therapy Treatment  Patient Details  Name: Benjamin Davila MRN: [id number] Date of Birth: [birth year] Referring Provider (PT): Melina Schools, MD   Encounter Date: 10/26/2020   PT End of Session - 10/26/20 1305    Visit Number 2    Number of Visits 13    Date for PT Re-Evaluation 12/02/20    Authorization Type BCBS MCR    PT Start Time 1117    PT Stop Time 1210    PT Time Calculation (min) 53 min    Equipment Utilized During Treatment Other (comment)   Ankle cuff and waist buoys, kick board, noodle/sqoodle, nekdoodle, weights and barbells   Activity Tolerance Patient tolerated treatment well           Past Medical History:  Diagnosis Date  . Aortic stenosis, moderate 03/17/2019  . Autoimmune hepatitis ([facility]) 07/12/2020  . Benign prostatic hyperplasia without lower urinary tract symptoms 07/12/2020  . Cardiac murmur 12/05/2018  . Diabetes mellitus due to underlying condition with unspecified complications ([place]) 6/76/1950  . Essential hypertension 12/05/2018  . 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 History of pancreatitis 07/12/2020  . History of pancytopenia 07/12/2020  . Hyperlipidemia, unspecified 07/12/2020  . Irregular heart beat 07/12/2020  . Long term (current) use of insulin ([place]) 07/12/2020  . Malignant neoplasm of prostate ([place]) 06/24/2019  . Mood disorder ([place]) 07/12/2020  . Prostate cancer ([place])   . Systolic murmur 9/32/6712  . Type 2 diabetes mellitus with other specified complication ([place]) 4/58/0998    Past Surgical History:  Procedure Laterality Date  . NO PAST SURGERIES      There were no vitals filed for this visit.   Subjective Assessment - 10/26/20 1301    Subjective Pt voiced skepticism that therapy will "help him".  States land exercises seem "a little dumb"    Limitations Walking    How long can you walk  comfortably? about 2 blocks    Patient Stated Goals reduce pain, improve function    Currently in Pain? Yes    Pain Score 4     Pain Location Back    Pain Orientation Lateral;Lower    Pain Descriptors / Indicators Aching    Pain Type Chronic pain    Pain Onset More than a month ago    Pain Frequency Intermittent                                     PT Education - 10/26/20 1304    Education Details Purpose and potential benefits of completing HEP    Person(s) Educated Patient    Methods Explanation;Demonstration    Comprehension Verbalized understanding;Need further instruction            PT Short Term Goals - 10/21/20 1552      PT SHORT TERM GOAL #1   Title pt will report ability to participate in pool activities without incr in lumbar and hip pain    Baseline activity incr pain at eval    Time 3    Period Weeks    Status New    Target Date 11/11/20             PT Long Term Goals - 10/21/20 1551      PT LONG TERM GOAL #1   Title pt will return  to activity level as in PLOF    Baseline pt and wife report limits in their lifestyle due to pain    Time 6    Period Weeks    Status New    Target Date 12/02/20      PT LONG TERM GOAL #2   Title independent in HEP    Baseline will progress and establish as appropriate    Time 6    Period Weeks    Status New    Target Date 12/02/20      PT LONG TERM GOAL #3   Title resolution of constant Lt lateral hip pain    Baseline constant at eval    Time 6    Period Weeks    Status New    Target Date 12/02/20                 Plan - 10/26/20 1306    Clinical Impression Statement Pt skeptical about therapy.  He is only minimumly comfortable in the aquatic setting.  Majority of session spent educating pt on condition and treatment to relieve pain while strengthening for better spinal alignment and muscular support. Pt with signifcnat muscle tightness of post core through ankles.  He  demonstrates poor coordnation and overall control of core.  Was able to stretch/lengthen and activate core musculature throughout with added effort and giving pt maximal vc and demonstration.    Personal Factors and Comorbidities Comorbidity 2;Time since onset of injury/illness/exacerbation    Comorbidities h/o CA & radiation, T2 DM    Examination-Activity Limitations Squat;Stairs;Bend;Stand;Locomotion Level;Sit    Examination-Participation Restrictions Community Activity    Stability/Clinical Decision Making Evolving/Moderate complexity    Clinical Decision Making Moderate    Rehab Potential Good    PT Frequency 2x / week    PT Duration 6 weeks    PT Treatment/Interventions ADLs/Self Care Home Management;Cryotherapy;Moist Heat;Iontophoresis 4mg /ml Dexamethasone;Electrical Stimulation;Stair training;Gait training;Therapeutic activities;Therapeutic exercise;Neuromuscular re-education;Patient/family education;Passive range of motion;Manual techniques;Dry needling;Taping;Aquatic Therapy    PT Next Visit Plan Change in discomfort or toleration to amb after last session?    PT Home Exercise Plan [email or web address]           Patient will benefit from skilled therapeutic intervention in order to improve the following deficits and impairments:  Difficulty walking,Increased muscle spasms,Improper body mechanics,Decreased activity tolerance,Decreased strength,Impaired flexibility,Postural dysfunction,Pain  Visit Diagnosis: Pain in left hip  Muscle weakness (generalized)  Chronic 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  Difficulty in walking, not elsewhere classified     Problem List Patient Active Problem List   Diagnosis Date Noted  . Aortic regurgitation 07/14/2020  . 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 Benign prostatic hyperplasia without lower urinary tract symptoms 07/12/2020  . 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 History of pancreatitis 07/12/2020  . History of pancytopenia 07/12/2020  .  Hyperlipidemia, unspecified 07/12/2020  . Irregular heart beat 07/12/2020  . Long term (current) use of insulin ([place]) 07/12/2020  . Mood disorder ([place]) 07/12/2020  . Systolic murmur 86/57/8469  . Type 2 diabetes mellitus with other specified complication ([place]) 62/95/2841  . Prostate cancer ([place])   . Malignant neoplasm of prostate ([place]) 06/24/2019  . Aortic stenosis, moderate 03/17/2019  . Cardiac murmur 12/05/2018  . Essential hypertension 12/05/2018  . Diabetes mellitus due to underlying condition with unspecified complications [facility] [place]) 32/44/0102    Vedia Pereyra 10/26/2020, 1:18 PM  [facility] [address] [place], [place], [address] Phone: [phone]   Fax:  [phone]  Name: Benjamin Davila MRN: [id number] Date of Birth: [birth year]

## 2020-10-27 NOTE — Therapy (Signed)
[facility] [address] [place], [place], [address] Phone: [phone]   Fax:  [phone]  Physical Therapy Treatment  Patient Details  Name: Benjamin Davila MRN: [id number] Date of Birth: 1940/11/13 Referring Provider (PT): Melina Schools, MD   Encounter Date: 10/26/2020   PT End of Session - 10/26/20 1305     Visit Number 2    Number of Visits 13    Date for PT Re-Evaluation 12/02/20    Authorization Type BCBS MCR    PT Start Time 1117    PT Stop Time 1210    PT Time Calculation (min) 53 min    Equipment Utilized During Treatment Other (comment)   Ankle cuff and waist buoys, kick board, noodle/sqoodle, nekdoodle, weights and barbells   Activity Tolerance Patient tolerated treatment well             Past Medical History:  Diagnosis Date   Aortic stenosis, moderate 03/17/2019   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   Benign prostatic hyperplasia without lower urinary tract symptoms 07/12/2020   Cardiac murmur 12/05/2018   Diabetes mellitus due to underlying condition with unspecified complications ([place]) 05/23/7251   Essential hypertension 12/05/2018   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   History of pancreatitis 07/12/2020   History of pancytopenia 07/12/2020   Hyperlipidemia, unspecified 07/12/2020   Irregular heart beat 07/12/2020   Long term (current) use of insulin ([place]) 07/12/2020   Malignant neoplasm of prostate ([place]) 06/24/2019   Mood disorder ([place]) 07/12/2020   Prostate cancer ([place])    Systolic murmur 6/64/4034   Type 2 diabetes mellitus with other specified complication ([place]) 7/42/5956    Past Surgical History:  Procedure Laterality Date   NO PAST SURGERIES      There were no vitals filed for this visit.   Subjective Assessment - 10/26/20 1301     Subjective Pt voiced skepticism that therapy will "help him".  States land exercises seem "a little dumb"    Limitations Walking    How long can you walk comfortably?  about 2 blocks    Patient Stated Goals reduce pain, improve function    Currently in Pain? Yes    Pain Score 4     Pain Location Back    Pain Orientation Lateral;Lower    Pain Descriptors / Indicators Aching    Pain Type Chronic pain    Pain Onset More than a month ago    Pain Frequency Intermittent             TREATMENT Pt seen for aquatic therapy today.  Treatment took place in water 3.25-4.8 ft in depth at the [facility] pool. Temp of water was 91.  Pt entered/exited the pool via stairs (step through pattern) independently with bilat rail.  Warm up Water walking Six widths of pool forward, backward and sidestepping with queuing for increased speed and hand placement for resistance.  Seated at wall 70% submerged Stretching hamstrings, gastrocs and low back manual assist and queuing for technique. Posterior pelvic tilts completed with skill and effective stretch  Standing 50% submerged Ankle buoy cuffs: holding to wall abduction/add duction, hip extension and marching 210 repetitions Core strengthening noodle and kickboard push downs 210 repetitions queuing for pelvic stabilization while completing.    Vertical suspension supported by foam buoys: Decompression, knees to chest and general rotation facilitated by physical therapist holding at pelvis to ensure erector spinae stretch and abdominal strengthening. 3x10 reps.  Supine suspension with foam suspension Stretching and strengthening axial core  musculature facilitated by physical therapist ,  patient maneuvered in aquatic setting laterally for passive stretch  with queuing axial for activation  Pt requires buoyancy for support and to offload joints with strengthening exercises. Viscosity of the water is needed for resistance of strengthening; water current perturbations provides challenge to standing balance unsupported, requiring increased core activation                          PT  Education - 10/26/20 1304     Education Details Purpose and potential benefits of completing HEP    Person(s) Educated Patient    Methods Explanation;Demonstration    Comprehension Verbalized understanding;Need further instruction              PT Short Term Goals - 10/21/20 1552       PT SHORT TERM GOAL #1   Title pt will report ability to participate in pool activities without incr in lumbar and hip pain    Baseline activity incr pain at eval    Time 3    Period Weeks    Status New    Target Date 11/11/20               PT Long Term Goals - 10/21/20 1551       PT LONG TERM GOAL #1   Title pt will return to activity level as in PLOF    Baseline pt and wife report limits in their lifestyle due to pain    Time 6    Period Weeks    Status New    Target Date 12/02/20      PT LONG TERM GOAL #2   Title independent in HEP    Baseline will progress and establish as appropriate    Time 6    Period Weeks    Status New    Target Date 12/02/20      PT LONG TERM GOAL #3   Title resolution of constant Lt lateral hip pain    Baseline constant at eval    Time 6    Period Weeks    Status New    Target Date 12/02/20                   Plan - 10/26/20 1306     Clinical Impression Statement Pt skeptical about therapy.  He is only minimumly comfortable in the aquatic setting.  Majority of session spent educating pt on condition and treatment to relieve pain while strengthening for better spinal alignment and muscular support. Pt with signifcnat muscle tightness of post core through ankles.  He demonstrates poor coordnation and overall control of core.  Was able to stretch/lengthen and activate core musculature throughout with added effort and giving pt maximal vc and demonstration.    Personal Factors and Comorbidities Comorbidity 2;Time since onset of injury/illness/exacerbation    Comorbidities h/o CA & radiation, T2 DM    Examination-Activity Limitations  Squat;Stairs;Bend;Stand;Locomotion Level;Sit    Examination-Participation Restrictions Community Activity    Stability/Clinical Decision Making Evolving/Moderate complexity    Clinical Decision Making Moderate    Rehab Potential Good    PT Frequency 2x / week    PT Duration 6 weeks    PT Treatment/Interventions ADLs/Self Care Home Management;Cryotherapy;Moist Heat;Iontophoresis 4mg /ml Dexamethasone;Electrical Stimulation;Stair training;Gait training;Therapeutic activities;Therapeutic exercise;Neuromuscular re-education;Patient/family education;Passive range of motion;Manual techniques;Dry needling;Taping;Aquatic Therapy    PT Next Visit Plan Change in discomfort or toleration to amb after last session?  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   Benign prostatic hyperplasia without lower urinary tract symptoms 07/12/2020   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   History of pancreatitis 07/12/2020   History of pancytopenia 07/12/2020   Hyperlipidemia, unspecified 07/12/2020   Irregular heart beat 07/12/2020   Long term (current) use of insulin ([place]) 07/12/2020   Mood disorder ([place]) 41/28/7867   Systolic murmur 67/20/9470   Type 2 diabetes mellitus with other specified complication ([place]) 96/28/3662   Prostate cancer ([place])    Malignant neoplasm of prostate ([place]) 06/24/2019   Aortic stenosis, moderate  03/17/2019   Cardiac murmur 12/05/2018   Essential hypertension 12/05/2018   Diabetes mellitus due to underlying condition with unspecified complications ([place]) 94/76/5465    Vedia Pereyra MPT 10/26/2020, 1:18 PM  [facility] [address] [place], [place], [address] Phone: [phone]   Fax:  [phone]  Name: Benjamin Davila MRN: [id number] Date of Birth: 15-Jul-1940

## 2020-10-28 ENCOUNTER — Ambulatory Visit (HOSPITAL_BASED_OUTPATIENT_CLINIC_OR_DEPARTMENT_OTHER): Payer: Medicare Other | Admitting: Physical Therapy

## 2020-10-28 ENCOUNTER — Other Ambulatory Visit: Payer: Self-pay

## 2020-10-28 ENCOUNTER — Encounter (HOSPITAL_BASED_OUTPATIENT_CLINIC_OR_DEPARTMENT_OTHER): Payer: Self-pay | Admitting: Physical Therapy

## 2020-10-28 DIAGNOSIS — R262 Difficulty in walking, not elsewhere classified: Secondary | ICD-10-CM

## 2020-10-28 DIAGNOSIS — G8929 Other chronic pain: Secondary | ICD-10-CM

## 2020-10-28 DIAGNOSIS — M25552 Pain in left hip: Secondary | ICD-10-CM | POA: Diagnosis not present

## 2020-10-28 DIAGNOSIS — M6281 Muscle weakness (generalized): Secondary | ICD-10-CM

## 2020-10-28 NOTE — Patient Instructions (Signed)
Importance of continuing with HEP, purpose and benefits, giving it time to see results.

## 2020-10-28 NOTE — Therapy (Signed)
[facility] [address] [place], [place], [address] Phone: [phone]   Fax:  [phone]  Physical Therapy Treatment  Patient Details  Name: Benjamin Davila MRN: [id number] Date of Birth: Jan 13, 1941 Referring Provider (PT): Melina Schools, MD   Encounter Date: 10/28/2020   PT End of Session - 10/28/20 1426     Visit Number 3    Number of Visits 13    Date for PT Re-Evaluation 12/02/20    Authorization Type BCBS MCR    PT Start Time 0946    PT Stop Time 1031    PT Time Calculation (min) 45 min    Equipment Utilized During Treatment Other (comment)    Activity Tolerance Patient tolerated treatment well    Behavior During Therapy [facility] for tasks assessed/performed             Past Medical History:  Diagnosis Date   Aortic stenosis, moderate 03/17/2019   Autoimmune hepatitis ([place]) 07/12/2020   Benign prostatic hyperplasia without lower urinary tract symptoms 07/12/2020   Cardiac murmur 12/05/2018   Diabetes mellitus due to underlying condition with unspecified complications ([place]) 1/60/1093   Essential hypertension 12/05/2018   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   History of pancreatitis 07/12/2020   History of pancytopenia 07/12/2020   Hyperlipidemia, unspecified 07/12/2020   Irregular heart beat 07/12/2020   Long term (current) use of insulin ([place]) 07/12/2020   Malignant neoplasm of prostate ([place]) 06/24/2019   Mood disorder ([place]) 07/12/2020   Prostate cancer ([place])    Systolic murmur 2/35/5732   Type 2 diabetes mellitus with other specified complication ([place]) 06/22/5425    Past Surgical History:  Procedure Laterality Date   NO PAST SURGERIES      There were no vitals filed for this visit.   Subjective Assessment - 10/28/20 1200     Subjective Im still skeptical.  I was hurting pretty bad the otherday. I don't think PT had anything to do with it, I justfeel as thought this is how its going to be.  "I'm almost 80"     Limitations Walking              TREATMENT Pt seen for aquatic therapy today.  Treatment took place in water 3.25-4.8 ft in depth at the [facility] pool. Temp of water was 91.  Pt entered/exited the pool via stairs (step through pattern) independently with bilat rail.   Warm up Water walking Six widths of pool forward, backward and sidestepping with queuing for increased speed and hand placement for resistance.   Seated at wall 70% submerged Stretching hamstrings, gastrocs and low back manual assist and queuing for technique.  Prone "Superman" Hands on water wall, buoys feet and noodle support. Abdominal stretching, knees to chest facilitated by therapist for LB stretch, strengthening of same muscle knees to chest, kicking back into ext 2 x 7 reps, core flex/ext 2 x 7 reps.   Standing 50% submerged Ankle buoy cuffs: holding to wall abduction/add duction, hip extension and marching 210 repetitions Core strengthening noodle and kickboard push downs 210 repetitions queuing for pelvic stabilization while completing.                          PT Education - 10/28/20 1425     Education Details Importance of continuing with HEP, giving it time to see results.    Methods Explanation    Comprehension Verbalized understanding;Returned demonstration  PT Short Term Goals - 10/21/20 1552       PT SHORT TERM GOAL #1   Title pt will report ability to participate in pool activities without incr in lumbar and hip pain    Baseline activity incr pain at eval    Time 3    Period Weeks    Status New    Target Date 11/11/20               PT Long Term Goals - 10/21/20 1551       PT LONG TERM GOAL #1   Title pt will return to activity level as in PLOF    Baseline pt and wife report limits in their lifestyle due to pain    Time 6    Period Weeks    Status New    Target Date 12/02/20      PT LONG TERM GOAL #2   Title independent  in HEP    Baseline will progress and establish as appropriate    Time 6    Period Weeks    Status New    Target Date 12/02/20      PT LONG TERM GOAL #3   Title resolution of constant Lt lateral hip pain    Baseline constant at eval    Time 6    Period Weeks    Status New    Target Date 12/02/20                   Plan - 10/28/20 1427     Clinical Impression Statement Decreased intensity as pt over trying last session causing some discomfort.  He is instructed through all ex throughly to ensure understanding activating and stretching only the intended ms.  He does demonstrated improved understanding and completes without over exertion.    Personal Factors and Comorbidities Comorbidity 2;Time since onset of injury/illness/exacerbation    Comorbidities h/o CA & radiation, T2 DM    Examination-Activity Limitations Squat;Stairs;Bend;Stand;Locomotion Level;Sit    Examination-Participation Restrictions Community Activity    Stability/Clinical Decision Making Evolving/Moderate complexity    Clinical Decision Making Moderate    Rehab Potential Good    PT Frequency 2x / week    PT Duration 6 weeks    PT Treatment/Interventions ADLs/Self Care Home Management;Cryotherapy;Moist Heat;Iontophoresis 4mg /ml Dexamethasone;Electrical Stimulation;Stair training;Gait training;Therapeutic activities;Therapeutic exercise;Neuromuscular re-education;Patient/family education;Passive range of motion;Manual techniques;Dry needling;Taping;Aquatic Therapy    PT Next Visit Plan Advance program slowly for desired results of strength and pain relief    Consulted and Agree with Plan of Care Patient;Family member/caregiver             Patient will benefit from skilled therapeutic intervention in order to improve the following deficits and impairments:  Difficulty walking, Increased muscle spasms, Improper body mechanics, Decreased activity tolerance, Decreased strength, Impaired flexibility, Postural  dysfunction, Pain  Visit Diagnosis: Pain in left hip  Muscle weakness (generalized)  Chronic 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  Difficulty in walking, not elsewhere classified     Problem List Patient Active Problem List   Diagnosis Date Noted   Aortic regurgitation 07/14/2020   Autoimmune hepatitis ([place]) 07/12/2020   Benign prostatic hyperplasia without lower urinary tract symptoms 07/12/2020   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   History of pancreatitis 07/12/2020   History of pancytopenia 07/12/2020   Hyperlipidemia, unspecified 07/12/2020   Irregular heart beat 07/12/2020   Long term (current) use of insulin ([place]) 07/12/2020   Mood disorder ([place]) 47/01/6282   Systolic murmur 66/29/4765  Type 2 diabetes mellitus with other specified complication ([place]) 57/32/2025   Prostate cancer ([place])    Malignant neoplasm of prostate ([place]) 06/24/2019   Aortic stenosis, moderate 03/17/2019   Cardiac murmur 12/05/2018   Essential hypertension 12/05/2018   Diabetes mellitus due to underlying condition with unspecified complications ([place]) 42/70/6237    Vedia Pereyra MPT 10/28/2020, 2:33 PM  [facility] [address] [place], [place], [address] Phone: [phone]   Fax:  [phone]  Name: Benjamin Davila MRN: [id number] Date of Birth: 1941/03/23

## 2020-10-31 ENCOUNTER — Ambulatory Visit (HOSPITAL_BASED_OUTPATIENT_CLINIC_OR_DEPARTMENT_OTHER): Payer: Medicare Other | Admitting: Physical Therapy

## 2020-10-31 ENCOUNTER — Other Ambulatory Visit: Payer: Self-pay

## 2020-10-31 ENCOUNTER — Encounter (HOSPITAL_BASED_OUTPATIENT_CLINIC_OR_DEPARTMENT_OTHER): Payer: Self-pay | Admitting: Physical Therapy

## 2020-10-31 DIAGNOSIS — M25552 Pain in left hip: Secondary | ICD-10-CM

## 2020-10-31 DIAGNOSIS — M6281 Muscle weakness (generalized): Secondary | ICD-10-CM

## 2020-10-31 DIAGNOSIS — M545 Low back pain, unspecified: Secondary | ICD-10-CM

## 2020-10-31 DIAGNOSIS — R262 Difficulty in walking, not elsewhere classified: Secondary | ICD-10-CM

## 2020-10-31 NOTE — Therapy (Signed)
[facility] [address] [place], [place], [address] Phone: [phone]   Fax:  [phone]  Physical Therapy Treatment  Patient Details  Name: Benjamin Davila MRN: [id number] Date of Birth: 03-13-1941 Referring Provider (PT): Melina Schools, MD   Encounter Date: 10/31/2020   PT End of Session - 10/31/20 1316     Visit Number 4    Number of Visits 13    Date for PT Re-Evaluation 12/02/20    Authorization Type BCBS MCR    PT Start Time 1202    PT Stop Time 1245    PT Time Calculation (min) 43 min    Equipment Utilized During Treatment Other (comment)    Activity Tolerance Patient tolerated treatment well    Behavior During Therapy [facility] for tasks assessed/performed             Past Medical History:  Diagnosis Date   Aortic stenosis, moderate 03/17/2019   Autoimmune hepatitis ([place]) 07/12/2020   Benign prostatic hyperplasia without lower urinary tract symptoms 07/12/2020   Cardiac murmur 12/05/2018   Diabetes mellitus due to underlying condition with unspecified complications ([place]) 2/83/1517   Essential hypertension 12/05/2018   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   History of pancreatitis 07/12/2020   History of pancytopenia 07/12/2020   Hyperlipidemia, unspecified 07/12/2020   Irregular heart beat 07/12/2020   Long term (current) use of insulin ([place]) 07/12/2020   Malignant neoplasm of prostate ([place]) 06/24/2019   Mood disorder ([place]) 07/12/2020   Prostate cancer ([place])    Systolic murmur 11/04/735   Type 2 diabetes mellitus with other specified complication ([place]) 05/26/2692    Past Surgical History:  Procedure Laterality Date   NO PAST SURGERIES      There were no vitals filed for this visit.   Subjective Assessment - 10/31/20 1314     Subjective Pt reports that he has had no noticable improvements or declines.' Pain is about the same'                  TREATMENT Pt seen for aquatic therapy today.  Treatment took  place in water 3.25-4.8 ft in depth at the [facility] pool. Temp of water was 91.  Pt entered/exited the pool via stairs (step through pattern) independently with bilat rail.   Warm up Water walking Six widths of pool forward, backward and sidestepping with queuing for increased speed and hand placement for resistance.   Seated at wall 70% submerged Stretching hamstrings, gastrocs and low back manual assist and queuing for technique.   Prone "Superman" Hands on water wall, buoys feet and noodle support. Abdominal stretching, knees to chest facilitated by therapist for LB stretch, strengthening of same muscle knees to chest, kicking back into ext 10 reps, core flex/ext 10 reps.   Standing 50% submerged Noodle kick downs 2 x 10 reps holding to wall.  Progressed to supported by barbell 10 reps bilaterally.   Standing wide BOS on noodle  3 trials of 20 seconds with manual perturbations. Toe raises x 5 reps.  Attempted squats but unsuccessful.                   PT Short Term Goals - 10/21/20 1552       PT SHORT TERM GOAL #1   Title pt will report ability to participate in pool activities without incr in lumbar and hip pain    Baseline activity incr pain at eval    Time 3    Period Weeks  Status New    Target Date 11/11/20               PT Long Term Goals - 10/21/20 1551       PT LONG TERM GOAL #1   Title pt will return to activity level as in PLOF    Baseline pt and wife report limits in their lifestyle due to pain    Time 6    Period Weeks    Status New    Target Date 12/02/20      PT LONG TERM GOAL #2   Title independent in HEP    Baseline will progress and establish as appropriate    Time 6    Period Weeks    Status New    Target Date 12/02/20      PT LONG TERM GOAL #3   Title resolution of constant Lt lateral hip pain    Baseline constant at eval    Time 6    Period Weeks    Status New    Target Date 12/02/20                    Plan - 10/31/20 1318     Clinical Impression Statement Pt with improved muscle length of gastrocs and hamstring as demonstrated through full ankle df with knee ext. Concentratio today on balance chalenges advancing to noodle kickdowns supported by water barbell.  Pt able to complete 3 reps out of 10 with LOB.    Personal Factors and Comorbidities Comorbidity 2;Time since onset of injury/illness/exacerbation    Examination-Activity Limitations Squat;Stairs;Bend;Stand;Locomotion Level;Sit    Stability/Clinical Decision Making Evolving/Moderate complexity    Clinical Decision Making Moderate    Rehab Potential Good    PT Frequency 2x / week    PT Duration 6 weeks    PT Treatment/Interventions ADLs/Self Care Home Management;Cryotherapy;Moist Heat;Iontophoresis 4mg /ml Dexamethasone;Electrical Stimulation;Stair training;Gait training;Therapeutic activities;Therapeutic exercise;Neuromuscular re-education;Patient/family education;Passive range of motion;Manual techniques;Dry needling;Taping;Aquatic Therapy    PT Home Exercise Plan [id number]             Patient will benefit from skilled therapeutic intervention in order to improve the following deficits and impairments:  Difficulty walking, Increased muscle spasms, Improper body mechanics, Decreased activity tolerance, Decreased strength, Impaired flexibility, Postural dysfunction, Pain  Visit Diagnosis: Muscle weakness (generalized)  Pain in left hip  Chronic 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  Difficulty in walking, not elsewhere classified     Problem List Patient Active Problem List   Diagnosis Date Noted   Aortic regurgitation 07/14/2020   Autoimmune hepatitis ([place]) 07/12/2020   Benign prostatic hyperplasia without lower urinary tract symptoms 07/12/2020   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   History of pancreatitis 07/12/2020   History of pancytopenia 07/12/2020   Hyperlipidemia, unspecified 07/12/2020    Irregular heart beat 07/12/2020   Long term (current) use of insulin ([place]) 07/12/2020   Mood disorder ([place]) 23/53/6144   Systolic murmur 31/54/0086   Type 2 diabetes mellitus with other specified complication ([place]) 76/19/5093   Prostate cancer ([place])    Malignant neoplasm of prostate ([place]) 06/24/2019   Aortic stenosis, moderate 03/17/2019   Cardiac murmur 12/05/2018   Essential hypertension 12/05/2018   Diabetes mellitus due to underlying condition with unspecified complications ([place]) 26/71/2458    Vedia Pereyra MPT 10/31/2020, 1:36 PM  [facility] [address] [place], [place], [address] Phone: [phone]   Fax:  [phone]  Name: Benjamin Davila MRN: [id number] Date of Birth:  11/19/1940    

## 2020-11-03 ENCOUNTER — Ambulatory Visit (HOSPITAL_BASED_OUTPATIENT_CLINIC_OR_DEPARTMENT_OTHER): Payer: Medicare Other | Admitting: Physical Therapy

## 2020-11-03 ENCOUNTER — Other Ambulatory Visit: Payer: Self-pay

## 2020-11-03 ENCOUNTER — Encounter (HOSPITAL_BASED_OUTPATIENT_CLINIC_OR_DEPARTMENT_OTHER): Payer: Self-pay | Admitting: Physical Therapy

## 2020-11-03 DIAGNOSIS — R262 Difficulty in walking, not elsewhere classified: Secondary | ICD-10-CM

## 2020-11-03 DIAGNOSIS — M25552 Pain in left hip: Secondary | ICD-10-CM

## 2020-11-03 DIAGNOSIS — M545 Low back pain, unspecified: Secondary | ICD-10-CM

## 2020-11-03 DIAGNOSIS — M6281 Muscle weakness (generalized): Secondary | ICD-10-CM

## 2020-11-03 NOTE — Therapy (Addendum)
[facility] [address] [place], [place], [address] Phone: [phone]   Fax:  [phone]  Physical Therapy Treatment  Subjective: Wife with pt today.  He reports some increase in right sided lb discomfort radiating into top of buttock.  Pain: lower right back aching chronic  Patient Details  Name: Benjamin Davila MRN: [id number] Date of Birth: 01/24/1941 Referring Provider (PT): Melina Schools, MD   Encounter Date: 11/03/2020     Past Medical History:  Diagnosis Date   Aortic stenosis, moderate 03/17/2019   Autoimmune hepatitis ([place]) 07/12/2020   Benign prostatic hyperplasia without lower urinary tract symptoms 07/12/2020   Cardiac murmur 12/05/2018   Diabetes mellitus due to underlying condition with unspecified complications ([place]) 8/52/7782   Essential hypertension 12/05/2018   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   History of pancreatitis 07/12/2020   History of pancytopenia 07/12/2020   Hyperlipidemia, unspecified 07/12/2020   Irregular heart beat 07/12/2020   Long term (current) use of insulin ([place]) 07/12/2020   Malignant neoplasm of prostate ([place]) 06/24/2019   Mood disorder ([place]) 07/12/2020   Prostate cancer ([place])    Systolic murmur 09/11/5359   Type 2 diabetes mellitus with other specified complication ([place]) 4/43/1540    Past Surgical History:  Procedure Laterality Date   NO PAST SURGERIES      There were no vitals filed for this visit.    TREATMENT Pt seen for aquatic therapy today.  Treatment took place in water 3.25-4.8 ft in depth at the [facility] pool. Temp of water was 91.  Pt entered/exited the pool via stairs (step through pattern) independently with bilat rail.   Warm up Water walking Six widths of pool forward, backward and sidestepping with queuing for increased speed and hand placement for resistance.   Seated at wall 70% submerged Stretching hamstrings, gastrocs and low back manual assist and  queuing for technique.   Prone "Superman" Hands on water wall, buoys feet and noodle support. Abdominal stretching, knees to chest facilitated by therapist for LB stretch, strengthening of same muscle knees to chest, kicking back into ext 10 reps, core flex/ext 10 reps. Added knees to chest with rotation right and left x 10 reps   Standing 50% submerged Add/abd, hip extension ex then flexor stretch using ankle cuffs x 10 reps.  Queing for core tight core throughout.  Knees to chest for LB strtch PT stabilazing and adding gentle overpressure  Sup suspended Water "dragging" iliciting lateral core activation and stretching.  Manual stretching of lat axial core contract relax, then active lateral bending resisted by water perturbations                            PT Short Term Goals - 11/10/20 2027       PT SHORT TERM GOAL #1   Title pt will report ability to participate in pool activities without incr in lumbar and hip pain    Status Achieved               PT Long Term Goals - 10/21/20 1551       PT LONG TERM GOAL #1   Title pt will return to activity level as in PLOF    Baseline pt and wife report limits in their lifestyle due to pain    Time 6    Period Weeks    Status New    Target Date 12/02/20      PT LONG TERM GOAL #  2   Title independent in [place] will progress and establish as appropriate    Time 6    Period Weeks    Status New    Target Date 12/02/20      PT LONG TERM GOAL #3   Title resolution of constant Lt lateral hip pain    Baseline constant at eval    Time 6    Period Weeks    Status New    Target Date 12/02/20            Clinical Impression Statement  Pt continues to be skeptical about purpose and potential outcome of therapy.  He completes all exercises well.  Needs extra cuing for coordiation.  Improved balance in aquatic setting with improved core strength.    Patient will benefit from skilled therapeutic  intervention in order to improve the following deficits and impairments:  Difficulty walking, Increased muscle spasms, Improper body mechanics, Decreased activity tolerance, Decreased strength, Impaired flexibility, Postural dysfunction, Pain  Visit Diagnosis: Muscle weakness (generalized)  Pain in left hip  Chronic 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  Difficulty in walking, not elsewhere classified     Problem List Patient Active Problem List   Diagnosis Date Noted   Aortic regurgitation 07/14/2020   Autoimmune hepatitis ([place]) 07/12/2020   Benign prostatic hyperplasia without lower urinary tract symptoms 07/12/2020   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   History of pancreatitis 07/12/2020   History of pancytopenia 07/12/2020   Hyperlipidemia, unspecified 07/12/2020   Irregular heart beat 07/12/2020   Long term (current) use of insulin ([place]) 07/12/2020   Mood disorder ([place]) 71/24/5809   Systolic murmur 98/33/8250   Type 2 diabetes mellitus with other specified complication ([place]) 53/97/6734   Prostate cancer ([place])    Malignant neoplasm of prostate ([place]) 06/24/2019   Aortic stenosis, moderate 03/17/2019   Cardiac murmur 12/05/2018   Essential hypertension 12/05/2018   Diabetes mellitus due to underlying condition with unspecified complications ([place]) 19/37/9024    Vedia Pereyra MPT 11/18/2020, 2:09 PM  [facility] [address] [place], [place], [address] Phone: [phone]   Fax:  [phone]  Name: Murrel Bertram MRN: [id number] Date of Birth: May 15, 1941  Stanton Kidney Tharon Aquas) [place] MPT

## 2020-11-07 ENCOUNTER — Ambulatory Visit (HOSPITAL_BASED_OUTPATIENT_CLINIC_OR_DEPARTMENT_OTHER): Payer: Medicare Other | Admitting: Physical Therapy

## 2020-11-10 ENCOUNTER — Other Ambulatory Visit: Payer: Self-pay

## 2020-11-10 ENCOUNTER — Ambulatory Visit (HOSPITAL_BASED_OUTPATIENT_CLINIC_OR_DEPARTMENT_OTHER): Payer: Medicare Other | Admitting: Physical Therapy

## 2020-11-10 ENCOUNTER — Encounter (HOSPITAL_BASED_OUTPATIENT_CLINIC_OR_DEPARTMENT_OTHER): Payer: Self-pay | Admitting: Physical Therapy

## 2020-11-10 DIAGNOSIS — G8929 Other chronic pain: Secondary | ICD-10-CM

## 2020-11-10 DIAGNOSIS — M25552 Pain in left hip: Secondary | ICD-10-CM | POA: Diagnosis not present

## 2020-11-10 DIAGNOSIS — M6281 Muscle weakness (generalized): Secondary | ICD-10-CM

## 2020-11-10 DIAGNOSIS — R262 Difficulty in walking, not elsewhere classified: Secondary | ICD-10-CM

## 2020-11-10 DIAGNOSIS — M545 Low back pain, unspecified: Secondary | ICD-10-CM

## 2020-11-10 NOTE — Therapy (Signed)
[facility] [address] [place], [place], [address] Phone: [phone]   Fax:  [phone]  Physical Therapy Treatment  Patient Details  Name: Benjamin Davila MRN: [id number] Date of Birth: [birth year] Referring Provider (PT): Melina Schools, MD   Encounter Date: 11/10/2020   PT End of Session - 11/10/20 1428     Visit Number 6    Number of Visits 13    Date for PT Re-Evaluation 12/02/20    Authorization Type BCBS MCR    PT Start Time 9622    PT Stop Time 2979    PT Time Calculation (min) 43 min    Activity Tolerance Patient tolerated treatment well    Behavior During Therapy [facility] for tasks assessed/performed             Past Medical History:  Diagnosis Date   Aortic stenosis, moderate 03/17/2019   Autoimmune hepatitis ([place]) 07/12/2020   Benign prostatic hyperplasia without lower urinary tract symptoms 07/12/2020   Cardiac murmur 12/05/2018   Diabetes mellitus due to underlying condition with unspecified complications ([place]) 8/92/1194   Essential hypertension 12/05/2018   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   History of pancreatitis 07/12/2020   History of pancytopenia 07/12/2020   Hyperlipidemia, unspecified 07/12/2020   Irregular heart beat 07/12/2020   Long term (current) use of insulin ([place]) 07/12/2020   Malignant neoplasm of prostate ([place]) 06/24/2019   Mood disorder ([place]) 07/12/2020   Prostate cancer ([place])    Systolic murmur 1/74/0814   Type 2 diabetes mellitus with other specified complication ([place]) 4/81/8563    Past Surgical History:  Procedure Laterality Date   NO PAST SURGERIES      There were no vitals filed for this visit.   Subjective Assessment - 11/10/20 2020     Subjective May or may not be stronger or more flexible. MRI revealed stenosis and discussed possible surgery with Dr Rolena Infante    Patient Stated Goals reduce pain, improve function    Currently in Pain? Yes    Pain Location Back    Pain Orientation  Right;Lower    Pain Descriptors / Indicators Aching    Aggravating Factors  being still for too long, walking /standing long periods    Pain Relieving Factors change position, rest                                       PT Education - 11/10/20 2021     Education Details see plan    Person(s) Educated Patient;Spouse    Methods Explanation    Comprehension Verbalized understanding;Need further instruction              PT Short Term Goals - 11/10/20 2027       PT SHORT TERM GOAL #1   Title pt will report ability to participate in pool activities without incr in lumbar and hip pain    Status Achieved               PT Long Term Goals - 10/21/20 1551       PT LONG TERM GOAL #1   Title pt will return to activity level as in PLOF    Baseline pt and wife report limits in their lifestyle due to pain    Time 6    Period Weeks    Status New    Target Date 12/02/20      PT LONG  TERM GOAL #2   Title independent in HEP    Baseline will progress and establish as appropriate    Time 6    Period Weeks    Status New    Target Date 12/02/20      PT LONG TERM GOAL #3   Title resolution of constant Lt lateral hip pain    Baseline constant at eval    Time 6    Period Weeks    Status New    Target Date 12/02/20                   Plan - 11/10/20 2021     Clinical Impression Statement The entirety of the patient's apt was taken to discuss plan of care and the what-ifs: if he had surgery, prehab vs rehab, conservative care vs surgery, types of treatments etc. We also discussed the anatomy of stenosis and treatment aproaches. At this point he is not committed to saying that he has improved but he would like to continue rehab. Due to discussions, there was not time to obtain objective measures but we will do so at his next land visit. Pt would like to continue his POC but once a week in the pool and once/week on land.    PT  Treatment/Interventions ADLs/Self Care Home Management;Cryotherapy;Moist Heat;Iontophoresis 4mg /ml Dexamethasone;Electrical Stimulation;Stair training;Gait training;Therapeutic activities;Therapeutic exercise;Neuromuscular re-education;Patient/family education;Passive range of motion;Manual techniques;Dry needling;Taping;Aquatic Therapy    PT Next Visit Plan next land visit: ojective measures &PN, send update to Dr Rolena Infante    PT Home Exercise Plan [email or web address]    Consulted and Agree with Plan of Care Patient;Family member/caregiver    Family Member Consulted wife             Patient will benefit from skilled therapeutic intervention in order to improve the following deficits and impairments:  Difficulty walking, Increased muscle spasms, Improper body mechanics, Decreased activity tolerance, Decreased strength, Impaired flexibility, Postural dysfunction, Pain  Visit Diagnosis: Muscle weakness (generalized)  Pain in left hip  Chronic 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  Difficulty in walking, not elsewhere classified     Problem List Patient Active Problem List   Diagnosis Date Noted   Aortic regurgitation 07/14/2020   Autoimmune hepatitis ([place]) 07/12/2020   Benign prostatic hyperplasia without lower urinary tract symptoms 07/12/2020   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   History of pancreatitis 07/12/2020   History of pancytopenia 07/12/2020   Hyperlipidemia, unspecified 07/12/2020   Irregular heart beat 07/12/2020   Long term (current) use of insulin ([place]) 07/12/2020   Mood disorder ([place]) 68/04/7516   Systolic murmur 00/17/4944   Type 2 diabetes mellitus with other specified complication ([place]) 96/75/9163   Prostate cancer ([place])    Malignant neoplasm of prostate ([place]) 06/24/2019   Aortic stenosis, moderate 03/17/2019   Cardiac murmur 12/05/2018   Essential hypertension 12/05/2018   Diabetes mellitus due to underlying condition with unspecified complications ([place]) 84/66/5993    Tytiana Coles C. Istvan Behar PT, DPT 11/10/20 8:28 PM   [facility] [address] [place], [place], [address] Phone: [phone]   Fax:  [phone]  Name: Benjamin Davila MRN: [id number] Date of Birth: April 08, 1941

## 2020-11-11 ENCOUNTER — Ambulatory Visit (HOSPITAL_BASED_OUTPATIENT_CLINIC_OR_DEPARTMENT_OTHER): Payer: Medicare Other | Admitting: Physical Therapy

## 2020-11-11 ENCOUNTER — Encounter (HOSPITAL_BASED_OUTPATIENT_CLINIC_OR_DEPARTMENT_OTHER): Payer: Self-pay | Admitting: Physical Therapy

## 2020-11-11 DIAGNOSIS — G8929 Other chronic pain: Secondary | ICD-10-CM

## 2020-11-11 DIAGNOSIS — M25552 Pain in left hip: Secondary | ICD-10-CM | POA: Diagnosis not present

## 2020-11-11 DIAGNOSIS — R262 Difficulty in walking, not elsewhere classified: Secondary | ICD-10-CM

## 2020-11-11 DIAGNOSIS — M6281 Muscle weakness (generalized): Secondary | ICD-10-CM

## 2020-11-11 NOTE — Therapy (Addendum)
[facility] [address] [place], [place], [address] Phone: [phone]   Fax:  [phone]  Physical Therapy Treatment  Patient Details  Name: Benjamin Davila MRN: [id number] Date of Birth: Dec 10, 1940 Referring Provider (PT): Melina Schools, MD   Encounter Date: 11/11/2020     Past Medical History:  Diagnosis Date   Aortic stenosis, moderate 03/17/2019   Autoimmune hepatitis ([place]) 07/12/2020   Benign prostatic hyperplasia without lower urinary tract symptoms 07/12/2020   Cardiac murmur 12/05/2018   Diabetes mellitus due to underlying condition with unspecified complications ([place]) 12/28/1749   Essential hypertension 12/05/2018   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   History of pancreatitis 07/12/2020   History of pancytopenia 07/12/2020   Hyperlipidemia, unspecified 07/12/2020   Irregular heart beat 07/12/2020   Long term (current) use of insulin ([place]) 07/12/2020   Malignant neoplasm of prostate ([place]) 06/24/2019   Mood disorder ([place]) 07/12/2020   Prostate cancer ([place])    Systolic murmur 0/25/8527   Type 2 diabetes mellitus with other specified complication ([place]) 7/82/4235    Past Surgical History:  Procedure Laterality Date   NO PAST SURGERIES     Subjective: Decided to coninue with aquatic therapy since I know the better heath you are in going into surgery the better you do.  Pain Yes, 4/10, right lower back, aching, chronic, intermittent  There were no vitals filed for this visit.  TREATMENT Pt seen for aquatic therapy today.  Treatment took place in water 3.25-4.8 ft in depth at the [facility] pool. Temp of water was 91.  Pt entered/exited the pool via stairs step through pattern independently with bilat rail.   Warm up Water walking Six widths of pool forward, backward and sidestepping with queuing for increased speed and hand placement for resistance.   Seated at wall 70% submerged Stretching hamstrings, gastrocs  and low back manual assist and queuing for technique.     Standing 50% submerged Add/abd, hip extension ex then flexor stretch using ankle cuffs x 10 reps.  Queing for core tight core throughout.  Knees to chest for LB strtch PT stabilazing and adding gentle overpressure Noodle kickdowns 2 x 10 reps supported bilat hand buoys Hip flex stretching using noodle facing pool wall 3 trials of 20 secs cuing for positioning. Noodle pull through hip flex strengthening 2 x 10 reps                                PT Short Term Goals - 11/10/20 2027       PT SHORT TERM GOAL #1   Title pt will report ability to participate in pool activities without incr in lumbar and hip pain    Status Achieved               PT Long Term Goals - 10/21/20 1551       PT LONG TERM GOAL #1   Title pt will return to activity level as in PLOF    Baseline pt and wife report limits in their lifestyle due to pain    Time 6    Period Weeks    Status New    Target Date 12/02/20      PT LONG TERM GOAL #2   Title independent in HEP    Baseline will progress and establish as appropriate    Time 6    Period Weeks    Status New    Target  Date 12/02/20      PT LONG TERM GOAL #3   Title resolution of constant Lt lateral hip pain    Baseline constant at eval    Time 6    Period Weeks    Status New    Target Date 12/02/20           Clinical Impression Statement Majority of session is on edu. and discussion on POC. He will begin being seen on land 1 x weekly than in aquatic next. He paricipates in session with constant questions and comments on challenges. He is pessimistic that anyting will help. Putting forth 1/2 effort.         Patient will benefit from skilled therapeutic intervention in order to improve the following deficits and impairments:  Difficulty walking, Increased muscle spasms, Improper body mechanics, Decreased activity tolerance, Decreased strength, Impaired  flexibility, Postural dysfunction, Pain  Visit Diagnosis: Muscle weakness (generalized)  Pain in left hip  Chronic 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  Difficulty in walking, not elsewhere classified     Problem List Patient Active Problem List   Diagnosis Date Noted   Aortic regurgitation 07/14/2020   Autoimmune hepatitis ([place]) 07/12/2020   Benign prostatic hyperplasia without lower urinary tract symptoms 07/12/2020   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   History of pancreatitis 07/12/2020   History of pancytopenia 07/12/2020   Hyperlipidemia, unspecified 07/12/2020   Irregular heart beat 07/12/2020   Long term (current) use of insulin ([place]) 07/12/2020   Mood disorder ([place]) 46/56/8127   Systolic murmur 51/70/0174   Type 2 diabetes mellitus with other specified complication ([place]) 94/49/6759   Prostate cancer ([place])    Malignant neoplasm of prostate ([place]) 06/24/2019   Aortic stenosis, moderate 03/17/2019   Cardiac murmur 12/05/2018   Essential hypertension 12/05/2018   Diabetes mellitus due to underlying condition with unspecified complications ([place]) 16/38/4665    Vedia Pereyra  MPT 11/18/2020, 2:03 PM  [facility] [address] [place], [place], [address] Phone: [phone]   Fax:  [phone]  Name: Benjamin Davila MRN: [id number] Date of Birth: 02-17-1941  Addended by: Dylan Monforte Tharon Aquas) Smiley MPT

## 2020-11-16 ENCOUNTER — Ambulatory Visit (HOSPITAL_BASED_OUTPATIENT_CLINIC_OR_DEPARTMENT_OTHER): Payer: Medicare Other | Admitting: Physical Therapy

## 2020-11-17 ENCOUNTER — Other Ambulatory Visit: Payer: Self-pay

## 2020-11-17 ENCOUNTER — Encounter (HOSPITAL_BASED_OUTPATIENT_CLINIC_OR_DEPARTMENT_OTHER): Payer: Self-pay | Admitting: Physical Therapy

## 2020-11-17 ENCOUNTER — Ambulatory Visit (HOSPITAL_BASED_OUTPATIENT_CLINIC_OR_DEPARTMENT_OTHER): Payer: Medicare Other | Admitting: Physical Therapy

## 2020-11-17 DIAGNOSIS — M6281 Muscle weakness (generalized): Secondary | ICD-10-CM

## 2020-11-17 DIAGNOSIS — M25552 Pain in left hip: Secondary | ICD-10-CM | POA: Diagnosis not present

## 2020-11-17 DIAGNOSIS — R262 Difficulty in walking, not elsewhere classified: Secondary | ICD-10-CM

## 2020-11-17 DIAGNOSIS — M545 Low back pain, unspecified: Secondary | ICD-10-CM

## 2020-11-17 NOTE — Therapy (Signed)
[place], [place], [address] Phone: [phone]   Fax:  [phone]  Physical Therapy Treatment Progress Note Reporting Period 10/21/20 to 11/17/2020   See note below for Objective Data and Assessment of Progress/Goals.       Patient Details  Name: Benjamin Davila MRN: [id number] Date of Birth: 04/12/1941 Referring Provider (PT): Melina Schools, MD   Encounter Date: 11/17/2020   PT End of Session - 11/17/20 1018     Visit Number 8    Number of Visits 13    Date for PT Re-Evaluation 12/02/20    Authorization Type BCBS MCR    PT Start Time 1018    PT Stop Time 1104    PT Time Calculation (min) 46 min    Activity Tolerance Patient tolerated treatment well    Behavior During Therapy [facility] for tasks assessed/performed             Past Medical History:  Diagnosis Date   Aortic stenosis, moderate 03/17/2019   Autoimmune hepatitis ([place]) 07/12/2020   Benign prostatic hyperplasia without lower urinary tract symptoms 07/12/2020   Cardiac murmur 12/05/2018   Diabetes mellitus due to underlying condition with unspecified complications ([place]) 3/79/0240   Essential hypertension 12/05/2018   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   History of pancreatitis 07/12/2020   History of pancytopenia 07/12/2020   Hyperlipidemia, unspecified 07/12/2020   Irregular heart beat 07/12/2020   Long term (current) use of insulin ([place]) 07/12/2020   Malignant neoplasm of prostate ([place]) 06/24/2019   Mood disorder ([place]) 07/12/2020   Prostate cancer ([place])    Systolic murmur 9/73/5329   Type 2 diabetes mellitus with other specified complication ([place]) 02/11/2682    Past Surgical History:  Procedure Laterality Date   NO PAST SURGERIES      There were no vitals filed for this visit.       [facility] PT Assessment - 11/17/20 0001       Posture/Postural Control   Posture Comments resolution of flexed posture, bil genu varus noted in gait       ROM / Strength   AROM / PROM / Strength Strength      Strength   Strength Assessment Site Hip    Right/Left Hip Right;Left    Right Hip Flexion 5/5    Right Hip ABduction 4/5    Left Hip Flexion 5/5    Left Hip ABduction 4/5      Palpation   Palpation comment a little discomfort in bil TFL and at greater trochanter                           [facility] Adult PT Treatment/Exercise - 11/17/20 0001       Knee/Hip Exercises: Aerobic   Stationary Bike 2 min follwoing DN      Knee/Hip Exercises: Machines for Strengthening   Cybex Leg Press first set at 55lb, second at 45      Manual Therapy   Manual Therapy Soft tissue mobilization    Manual therapy comments skilled palpation and monitoring during TPDN    Soft tissue mobilization Lt hip              Trigger Point Dry Needling - 11/17/20 0001     Consent Given? Yes    Education Handout Provided --   verbal education   Muscles Treated Back/Hip Gluteus minimus;Gluteus medius;Gluteus maximus;Tensor fascia lata    Other Dry Needling all on Lt  Gluteus Minimus Response Twitch response elicited;Palpable increased muscle length    Gluteus Medius Response Twitch response elicited;Palpable increased muscle length    Gluteus Maximus Response Twitch response elicited;Palpable increased muscle length    Tensor Fascia Lata Response Twitch response elicited;Palpable increased muscle length                  PT Education - 11/17/20 1121     Education Details objective measures. TPDN & expected outcomes, anatomy of condition, exercise rationale    Person(s) Educated Patient    Methods Explanation;Demonstration;Tactile cues;Verbal cues;Handout    Comprehension Returned demonstration;Verbal cues required;Tactile cues required;Need further instruction;Verbalized understanding              PT Short Term Goals - 11/10/20 2027       PT SHORT TERM GOAL #1   Title pt will report ability to participate in pool  activities without incr in lumbar and hip pain    Status Achieved               PT Long Term Goals - 10/21/20 1551       PT LONG TERM GOAL #1   Title pt will return to activity level as in PLOF    Baseline pt and wife report limits in their lifestyle due to pain    Time 6    Period Weeks    Status New    Target Date 12/02/20      PT LONG TERM GOAL #2   Title independent in HEP    Baseline will progress and establish as appropriate    Time 6    Period Weeks    Status New    Target Date 12/02/20      PT LONG TERM GOAL #3   Title resolution of constant Lt lateral hip pain    Baseline constant at eval    Time 6    Period Weeks    Status New    Target Date 12/02/20                   Plan - 11/17/20 1117     Clinical Impression Statement DN utilized to decrease tightness at Lt hip today. Noted that pt has obtained improved upright posure in standing and in gait but does have bil genu varus which can contribute to incr compresstion at femoral acetabular joint. He has pain when walking but not when on the bike- encouraged him to use exercises that do not hurt right now to strengthen and decrease pain and then progress back into walking as his primary form of exercise. he plans to come to the gym on visitor days so we discussed use of recumb bike and leg press.    PT Treatment/Interventions ADLs/Self Care Home Management;Cryotherapy;Moist Heat;Iontophoresis 4mg /ml Dexamethasone;Electrical Stimulation;Stair training;Gait training;Therapeutic activities;Therapeutic exercise;Neuromuscular re-education;Patient/family education;Passive range of motion;Manual techniques;Dry needling;Taping;Aquatic Therapy    PT Next Visit Plan outcome of Dry Needling?  cont core stability in pool & hip abd strength- working to stabilize anatomical genu varus    PT Home Exercise Plan [email or web address]    Consulted and Agree with Plan of Care Patient             Patient will benefit from skilled  therapeutic intervention in order to improve the following deficits and impairments:  Difficulty walking, Increased muscle spasms, Improper body mechanics, Decreased activity tolerance, Decreased strength, Impaired flexibility, Postural dysfunction, Pain  Visit Diagnosis: Muscle weakness (generalized)  Pain in left hip  Chronic  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  Difficulty in walking, not elsewhere classified     Problem List Patient Active Problem List   Diagnosis Date Noted   Aortic regurgitation 07/14/2020   Autoimmune hepatitis ([place]) 07/12/2020   Benign prostatic hyperplasia without lower urinary tract symptoms 07/12/2020   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   History of pancreatitis 07/12/2020   History of pancytopenia 07/12/2020   Hyperlipidemia, unspecified 07/12/2020   Irregular heart beat 07/12/2020   Long term (current) use of insulin ([place]) 07/12/2020   Mood disorder ([place]) 16/38/4665   Systolic murmur 99/35/7017   Type 2 diabetes mellitus with other specified complication ([place]) 79/39/0300   Prostate cancer ([place])    Malignant neoplasm of prostate ([place]) 06/24/2019   Aortic stenosis, moderate 03/17/2019   Cardiac murmur 12/05/2018   Essential hypertension 12/05/2018   Diabetes mellitus due to underlying condition with unspecified complications ([place]) 92/33/0076    Zlata Alcaide C. Allene Furuya PT, DPT 11/17/20 11:23 AM   [facility] [place], [place], [address] Phone: [phone]   Fax:  [phone]  Name: Marvon [place] MRN: [id number] Date of Birth: [birth year]

## 2020-11-18 ENCOUNTER — Encounter (HOSPITAL_BASED_OUTPATIENT_CLINIC_OR_DEPARTMENT_OTHER): Payer: Self-pay | Admitting: Physical Therapy

## 2020-11-18 ENCOUNTER — Ambulatory Visit (HOSPITAL_BASED_OUTPATIENT_CLINIC_OR_DEPARTMENT_OTHER): Payer: Medicare Other | Attending: Orthopedic Surgery | Admitting: Physical Therapy

## 2020-11-18 DIAGNOSIS — M25552 Pain in left hip: Secondary | ICD-10-CM | POA: Insufficient documentation

## 2020-11-18 DIAGNOSIS — M545 Low back pain, unspecified: Secondary | ICD-10-CM | POA: Insufficient documentation

## 2020-11-18 DIAGNOSIS — R262 Difficulty in walking, not elsewhere classified: Secondary | ICD-10-CM | POA: Insufficient documentation

## 2020-11-18 DIAGNOSIS — M6281 Muscle weakness (generalized): Secondary | ICD-10-CM | POA: Diagnosis not present

## 2020-11-18 DIAGNOSIS — G8929 Other chronic pain: Secondary | ICD-10-CM | POA: Diagnosis present

## 2020-11-18 NOTE — Therapy (Signed)
[facility] [address] [place], [place], [address] Phone: [phone]   Fax:  [phone]  Physical Therapy Treatment  Patient Details  Name: Benjamin Davila MRN: [id number] Date of Birth: Sep 09, 1940 Referring Provider (PT): Melina Schools, MD   Encounter Date: 11/18/2020   PT End of Session - 11/18/20 1336     Visit Number 9    Number of Visits 13    Date for PT Re-Evaluation 12/02/20    Authorization Type BCBS MCR    PT Start Time 1120    PT Stop Time 1200    PT Time Calculation (min) 40 min    Equipment Utilized During Treatment Other (comment)    Activity Tolerance Patient tolerated treatment well             Past Medical History:  Diagnosis Date   Aortic stenosis, moderate 03/17/2019   Autoimmune hepatitis ([place]) 07/12/2020   Benign prostatic hyperplasia without lower urinary tract symptoms 07/12/2020   Cardiac murmur 12/05/2018   Diabetes mellitus due to underlying condition with unspecified complications ([place]) 2/77/4128   Essential hypertension 12/05/2018   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   History of pancreatitis 07/12/2020   History of pancytopenia 07/12/2020   Hyperlipidemia, unspecified 07/12/2020   Irregular heart beat 07/12/2020   Long term (current) use of insulin ([place]) 07/12/2020   Malignant neoplasm of prostate ([place]) 06/24/2019   Mood disorder ([place]) 07/12/2020   Prostate cancer ([place])    Systolic murmur 7/86/7672   Type 2 diabetes mellitus with other specified complication ([place]) 0/94/7096    Past Surgical History:  Procedure Laterality Date   NO PAST SURGERIES      There were no vitals filed for this visit.   Subjective Assessment - 11/18/20 1120     Subjective Was dry needled last land visit.  Didn't seem to help,I see no difference.  Have been limping bad.  I do not velieve anything will help it, it hasn't so far.    Currently in Pain? Yes    Pain Score 4     Pain Location Back    Pain Orientation  Right;Lower    Pain Descriptors / Indicators Aching    Pain Type Chronic pain    Pain Onset More than a month ago    Pain Frequency Intermittent            TREATMENT Pt seen for aquatic therapy today.  Treatment took place in water 3.25-4.8 ft in depth at the [facility] pool. Temp of water was 91.  Pt entered/exited the pool via stairs step through pattern independently with bilat rail.   Warm up Water walking Six widths of pool forward, backward and sidestepping with queuing for increased speed and hand placement for resistance.   Seated at wall 70% submerged Stretching hamstrings, gastrocs, adductors and low back manual assist and queuing for technique.     Standing 50% submerged Add/abd, hip extension ex then flexor stretch using ankle cuffs x 10 reps.  Queing for core tight core throughout.   Supine suspension Lateral core stretching contract/relax, decompression pulling through water, deep pressure and massage to right hip Knee flex x10 reps, hip extension x 10 reps resisted by ankle buoy maintaining PPT.  Superman/Prone suspension Knees to chest, to right then left shoulder 2 x 30 for stretch Knee to chest x 10 cues for increasing speed and kicking out  PT Short Term Goals - 11/10/20 2027       PT SHORT TERM GOAL #1   Title pt will report ability to participate in pool activities without incr in lumbar and hip pain    Status Achieved               PT Long Term Goals - 10/21/20 1551       PT LONG TERM GOAL #1   Title pt will return to activity level as in PLOF    Baseline pt and wife report limits in their lifestyle due to pain    Time 6    Period Weeks    Status New    Target Date 12/02/20      PT LONG TERM GOAL #2   Title independent in HEP    Baseline will progress and establish as appropriate    Time 6    Period Weeks    Status New    Target Date 12/02/20      PT LONG TERM GOAL  #3   Title resolution of constant Lt lateral hip pain    Baseline constant at eval    Time 6    Period Weeks    Status New    Target Date 12/02/20                   Plan - 11/18/20 1337     Clinical Impression Statement Pt frustrated with lack of progression.  Noted antalgic limp left entering pool. palpation and deep pressure suspended in sup rightbuttock/hip area with some tenderness. Minimal discomfort in right hip with activities in pool.    Personal Factors and Comorbidities Comorbidity 2;Time since onset of injury/illness/exacerbation    Comorbidities h/o CA & radiation, T2 DM    Examination-Activity Limitations Squat;Stairs;Bend;Stand;Locomotion Level;Sit    Examination-Participation Restrictions Community Activity    Stability/Clinical Decision Making Evolving/Moderate complexity    Rehab Potential Good    PT Frequency 2x / week    PT Treatment/Interventions ADLs/Self Care Home Management;Cryotherapy;Moist Heat;Iontophoresis 4mg /ml Dexamethasone;Electrical Stimulation;Stair training;Gait training;Therapeutic activities;Therapeutic exercise;Neuromuscular re-education;Patient/family education;Passive range of motion;Manual techniques;Dry needling;Taping;Aquatic Therapy    PT Home Exercise Plan [email or web address]    Consulted and Agree with Plan of Care Patient    Family Member Consulted wife             Patient will benefit from skilled therapeutic intervention in order to improve the following deficits and impairments:  Difficulty walking, Increased muscle spasms, Improper body mechanics, Decreased activity tolerance, Decreased strength, Impaired flexibility, Postural dysfunction, Pain  Visit Diagnosis: Muscle weakness (generalized)  Pain in left hip  Chronic 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  Difficulty in walking, not elsewhere classified     Problem List Patient Active Problem List   Diagnosis Date Noted   Aortic regurgitation 07/14/2020   Autoimmune  hepatitis ([place]) 07/12/2020   Benign prostatic hyperplasia without lower urinary tract symptoms 07/12/2020   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   History of pancreatitis 07/12/2020   History of pancytopenia 07/12/2020   Hyperlipidemia, unspecified 07/12/2020   Irregular heart beat 07/12/2020   Long term (current) use of insulin ([place]) 07/12/2020   Mood disorder ([place]) 96/28/3662   Systolic murmur 94/76/5465   Type 2 diabetes mellitus with other specified complication ([place]) 03/54/6568   Prostate cancer ([place])    Malignant neoplasm of prostate ([place]) 06/24/2019   Aortic stenosis, moderate 03/17/2019   Cardiac murmur 12/05/2018   Essential hypertension 12/05/2018   Diabetes mellitus due to underlying  condition with unspecified complications ([place]) 40/39/7953    Vedia Pereyra  MPT 11/18/2020, 1:52 PM  [facility] [address] [place], [place], [address] Phone: [phone]   Fax:  [phone]  Name: Benjamin Davila MRN: [id number] Date of Birth: Apr 20, 1941

## 2020-11-23 ENCOUNTER — Encounter (HOSPITAL_BASED_OUTPATIENT_CLINIC_OR_DEPARTMENT_OTHER): Payer: Self-pay | Admitting: Physical Therapy

## 2020-11-23 ENCOUNTER — Ambulatory Visit (HOSPITAL_BASED_OUTPATIENT_CLINIC_OR_DEPARTMENT_OTHER): Payer: Medicare Other | Admitting: Physical Therapy

## 2020-11-23 ENCOUNTER — Other Ambulatory Visit: Payer: Self-pay

## 2020-11-23 DIAGNOSIS — M6281 Muscle weakness (generalized): Secondary | ICD-10-CM | POA: Diagnosis not present

## 2020-11-23 DIAGNOSIS — M25552 Pain in left hip: Secondary | ICD-10-CM

## 2020-11-23 DIAGNOSIS — G8929 Other chronic pain: Secondary | ICD-10-CM

## 2020-11-23 DIAGNOSIS — R262 Difficulty in walking, not elsewhere classified: Secondary | ICD-10-CM

## 2020-11-24 NOTE — Therapy (Signed)
[facility] [address] [place], [place], [address] Phone: [phone]   Fax:  [phone]  Physical Therapy Treatment  Patient Details  Name: Benjamin Davila MRN: [id number] Date of Birth: 21-Apr-1941 Referring Provider (PT): Melina Schools, MD   Encounter Date: 11/23/2020   PT End of Session - 11/23/20 1357     Visit Number 10    Number of Visits 13    Date for PT Re-Evaluation 12/02/20    Authorization Type BCBS MCR    PT Start Time 1120    PT Stop Time 1200    PT Time Calculation (min) 40 min    Equipment Utilized During Treatment Other (comment)    Activity Tolerance Patient tolerated treatment well;Patient limited by pain             Past Medical History:  Diagnosis Date   Aortic stenosis, moderate 03/17/2019   Autoimmune hepatitis ([place]) 07/12/2020   Benign prostatic hyperplasia without lower urinary tract symptoms 07/12/2020   Cardiac murmur 12/05/2018   Diabetes mellitus due to underlying condition with unspecified complications ([place]) 1/00/7121   Essential hypertension 12/05/2018   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   History of pancreatitis 07/12/2020   History of pancytopenia 07/12/2020   Hyperlipidemia, unspecified 07/12/2020   Irregular heart beat 07/12/2020   Long term (current) use of insulin ([place]) 07/12/2020   Malignant neoplasm of prostate ([place]) 06/24/2019   Mood disorder ([place]) 07/12/2020   Prostate cancer ([place])    Systolic murmur 9/75/8832   Type 2 diabetes mellitus with other specified complication ([place]) 5/49/8264    Past Surgical History:  Procedure Laterality Date   NO PAST SURGERIES      There were no vitals filed for this visit.   Subjective Assessment - 11/24/20 1353     Subjective Just not getting any relief.  Couldn't hardley get arounf this w/e    Limitations Walking    How long can you walk comfortably? about 2 blocks    Patient Stated Goals reduce pain, improve function    Currently in Pain?  Yes    Pain Score 6     Pain Location Back    Pain Orientation Right    Pain Type Chronic pain    Pain Onset More than a month ago    Pain Frequency Intermittent    Aggravating Factors  being still, standing after sitting for too long    Pain Relieving Factors changing positions            TREATMENT Pt seen for aquatic therapy today.  Treatment took place in water 3.25-4.8 ft in depth at the [facility] pool. Temp of water was 91.  Pt entered/exited the pool via stairs step through pattern independently with bilat rail.   Warm up Water walking Six widths of pool forward, backward and sidestepping with queuing for increased speed and hand placement for resistance.   Seated at wall 70% submerged Stretching hamstrings, gastrocs, adductors and low back manual assist and queuing for technique.     Standing 50% submerged Add/abd 2x 10 reps, hip circles 2 x 10 , hip flex stretch using noodle 3 x 30 sec hold bilat, then noodle pull through 2 x 10 reps.   Kick board push downs cuing for core stabilization 2 x 10 reps. Kick board rotations each direction 10 reps. VC and demonstration for proper technique and posture     Superman/Prone suspension LB stretch knees to chest stabilized by therapist 3x 30 sec hold Knees  to chest, rotating right and then left x 10 reps each stabilized by PT                             PT Short Term Goals - 11/10/20 2027       PT SHORT TERM GOAL #1   Title pt will report ability to participate in pool activities without incr in lumbar and hip pain    Status Achieved               PT Long Term Goals - 10/21/20 1551       PT LONG TERM GOAL #1   Title pt will return to activity level as in PLOF    Baseline pt and wife report limits in their lifestyle due to pain    Time 6    Period Weeks    Status New    Target Date 12/02/20      PT LONG TERM GOAL #2   Title independent in HEP    Baseline will progress and  establish as appropriate    Time 6    Period Weeks    Status New    Target Date 12/02/20      PT LONG TERM GOAL #3   Title resolution of constant Lt lateral hip pain    Baseline constant at eval    Time 6    Period Weeks    Status New    Target Date 12/02/20                   Plan - 11/24/20 1358     Clinical Impression Statement Focus on Stretching and strengthening of LB. Pt without any improvements in overall pain. Participates with fair effort. Limited today due to pain.    Personal Factors and Comorbidities Comorbidity 2;Time since onset of injury/illness/exacerbation    Comorbidities h/o CA & radiation, T2 DM    Examination-Activity Limitations Squat;Stairs;Bend;Stand;Locomotion Level;Sit    Stability/Clinical Decision Making Evolving/Moderate complexity    Rehab Potential Good    PT Frequency 2x / week    PT Duration 6 weeks    PT Treatment/Interventions ADLs/Self Care Home Management;Cryotherapy;Moist Heat;Iontophoresis 4mg /ml Dexamethasone;Electrical Stimulation;Stair training;Gait training;Therapeutic activities;Therapeutic exercise;Neuromuscular re-education;Patient/family education;Passive range of motion;Manual techniques;Dry needling;Taping;Aquatic Therapy    PT Home Exercise Plan [id number]             Patient will benefit from skilled therapeutic intervention in order to improve the following deficits and impairments:  Difficulty walking, Increased muscle spasms, Improper body mechanics, Decreased activity tolerance, Decreased strength, Impaired flexibility, Postural dysfunction, Pain  Visit Diagnosis: Muscle weakness (generalized)  Pain in left hip  Chronic 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  Difficulty in walking, not elsewhere classified     Problem List Patient Active Problem List   Diagnosis Date Noted   Aortic regurgitation 07/14/2020   Autoimmune hepatitis ([place]) 07/12/2020   Benign prostatic hyperplasia without lower urinary  tract symptoms 07/12/2020   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   History of pancreatitis 07/12/2020   History of pancytopenia 07/12/2020   Hyperlipidemia, unspecified 07/12/2020   Irregular heart beat 07/12/2020   Long term (current) use of insulin ([place]) 07/12/2020   Mood disorder ([place]) 51/76/1607   Systolic murmur 37/02/6268   Type 2 diabetes mellitus with other specified complication ([place]) 48/54/6270   Prostate cancer ([place])    Malignant neoplasm of prostate ([place]) 06/24/2019   Aortic stenosis, moderate 03/17/2019   Cardiac murmur  12/05/2018   Essential hypertension 12/05/2018   Diabetes mellitus due to underlying condition with unspecified complications ([place]) 35/82/5189    Vedia Pereyra  MPT 11/24/2020, 6:44 PM  [facility] [address] [place], [place], [address] Phone: [phone]   Fax:  [phone]  Name: Benjamin Davila MRN: [id number] Date of Birth: July 18, 1940

## 2020-11-25 ENCOUNTER — Ambulatory Visit (HOSPITAL_BASED_OUTPATIENT_CLINIC_OR_DEPARTMENT_OTHER): Payer: Medicare Other | Admitting: Physical Therapy

## 2020-11-25 ENCOUNTER — Other Ambulatory Visit: Payer: Self-pay

## 2020-11-25 ENCOUNTER — Encounter (HOSPITAL_BASED_OUTPATIENT_CLINIC_OR_DEPARTMENT_OTHER): Payer: Self-pay | Admitting: Physical Therapy

## 2020-11-25 DIAGNOSIS — G8929 Other chronic pain: Secondary | ICD-10-CM

## 2020-11-25 DIAGNOSIS — M25552 Pain in left hip: Secondary | ICD-10-CM

## 2020-11-25 DIAGNOSIS — R262 Difficulty in walking, not elsewhere classified: Secondary | ICD-10-CM

## 2020-11-25 DIAGNOSIS — M6281 Muscle weakness (generalized): Secondary | ICD-10-CM

## 2020-11-25 NOTE — Therapy (Signed)
[facility] [address] [place], [place], [address] Phone: [phone]   Fax:  [phone]  Physical Therapy Treatment  Patient Details  Name: Benjamin Davila MRN: [id number] Date of Birth: 1940-08-26 Referring Provider (PT): Melina Schools, MD   Encounter Date: 11/25/2020   PT End of Session - 11/25/20 1015     Visit Number 11    Number of Visits 13    Date for PT Re-Evaluation 12/02/20    Authorization Type BCBS MCR    PT Start Time 1937    PT Stop Time 1056    PT Time Calculation (min) 41 min    Activity Tolerance Patient tolerated treatment well    Behavior During Therapy [facility] for tasks assessed/performed             Past Medical History:  Diagnosis Date   Aortic stenosis, moderate 03/17/2019   Autoimmune hepatitis ([place]) 07/12/2020   Benign prostatic hyperplasia without lower urinary tract symptoms 07/12/2020   Cardiac murmur 12/05/2018   Diabetes mellitus due to underlying condition with unspecified complications ([place]) 01/21/4096   Essential hypertension 12/05/2018   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   History of pancreatitis 07/12/2020   History of pancytopenia 07/12/2020   Hyperlipidemia, unspecified 07/12/2020   Irregular heart beat 07/12/2020   Long term (current) use of insulin ([place]) 07/12/2020   Malignant neoplasm of prostate ([place]) 06/24/2019   Mood disorder ([place]) 07/12/2020   Prostate cancer ([place])    Systolic murmur 3/53/2992   Type 2 diabetes mellitus with other specified complication ([place]) 09/14/8339    Past Surgical History:  Procedure Laterality Date   NO PAST SURGERIES      There were no vitals filed for this visit.                      [facility] Adult PT Treatment/Exercise - 11/25/20 0001       Knee/Hip Exercises: Machines for Strengthening   Other Machine nu step, OC, recumb bike, leg press, knee ext, HS curl, lat pull, abd, add                      PT Short Term Goals -  11/10/20 2027       PT SHORT TERM GOAL #1   Title pt will report ability to participate in pool activities without incr in lumbar and hip pain    Status Achieved               PT Long Term Goals - 10/21/20 1551       PT LONG TERM GOAL #1   Title pt will return to activity level as in PLOF    Baseline pt and wife report limits in their lifestyle due to pain    Time 6    Period Weeks    Status New    Target Date 12/02/20      PT LONG TERM GOAL #2   Title independent in HEP    Baseline will progress and establish as appropriate    Time 6    Period Weeks    Status New    Target Date 12/02/20      PT LONG TERM GOAL #3   Title resolution of constant Lt lateral hip pain    Baseline constant at eval    Time 6    Period Weeks    Status New    Target Date 12/02/20  Plan - 11/25/20 1306     Clinical Impression Statement Worked on machines in gym today for pt to continue doing after he d/c from PT next week. He tolerated well and was encouraged to focus on good form and to balance strenghening and stretching. will do one more land and one more aquatic appt next week to finalize HEP prior to d/c.    PT Treatment/Interventions ADLs/Self Care Home Management;Cryotherapy;Moist Heat;Iontophoresis 4mg /ml Dexamethasone;Electrical Stimulation;Stair training;Gait training;Therapeutic activities;Therapeutic exercise;Neuromuscular re-education;Patient/family education;Passive range of motion;Manual techniques;Dry needling;Taping;Aquatic Therapy    PT Next Visit Plan review machines PRN, add mroe upper body. finalize HEP in pool    PT [place] and Agree with Plan of Care Patient             Patient will benefit from skilled therapeutic intervention in order to improve the following deficits and impairments:  Difficulty walking, Increased muscle spasms, Improper body mechanics, Decreased activity tolerance, Decreased strength,  Impaired flexibility, Postural dysfunction, Pain  Visit Diagnosis: Muscle weakness (generalized)  Pain in left hip  Chronic 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  Difficulty in walking, not elsewhere classified     Problem List Patient Active Problem List   Diagnosis Date Noted   Aortic regurgitation 07/14/2020   Autoimmune hepatitis ([place]) 07/12/2020   Benign prostatic hyperplasia without lower urinary tract symptoms 07/12/2020   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   History of pancreatitis 07/12/2020   History of pancytopenia 07/12/2020   Hyperlipidemia, unspecified 07/12/2020   Irregular heart beat 07/12/2020   Long term (current) use of insulin ([place]) 07/12/2020   Mood disorder ([place]) 86/57/8469   Systolic murmur 62/95/2841   Type 2 diabetes mellitus with other specified complication ([place]) 32/44/0102   Prostate cancer ([place])    Malignant neoplasm of prostate ([place]) 06/24/2019   Aortic stenosis, moderate 03/17/2019   Cardiac murmur 12/05/2018   Essential hypertension 12/05/2018   Diabetes mellitus due to underlying condition with unspecified complications ([place]) 72/53/6644   Esmond Hinch C. Tyriek Hofman PT, DPT 11/25/20 1:09 PM   [facility] [address] [place], [place], [address] Phone: [phone]   Fax:  [phone]  Name: Erian Rosengren MRN: [id number] Date of Birth: 1940/09/14

## 2020-11-28 ENCOUNTER — Ambulatory Visit (HOSPITAL_BASED_OUTPATIENT_CLINIC_OR_DEPARTMENT_OTHER): Payer: Medicare Other | Admitting: Physical Therapy

## 2020-11-28 ENCOUNTER — Encounter (HOSPITAL_BASED_OUTPATIENT_CLINIC_OR_DEPARTMENT_OTHER): Payer: Self-pay | Admitting: Physical Therapy

## 2020-11-28 ENCOUNTER — Other Ambulatory Visit: Payer: Self-pay

## 2020-11-28 DIAGNOSIS — G8929 Other chronic pain: Secondary | ICD-10-CM

## 2020-11-28 DIAGNOSIS — M6281 Muscle weakness (generalized): Secondary | ICD-10-CM | POA: Diagnosis not present

## 2020-11-28 DIAGNOSIS — M25552 Pain in left hip: Secondary | ICD-10-CM

## 2020-11-28 DIAGNOSIS — R262 Difficulty in walking, not elsewhere classified: Secondary | ICD-10-CM

## 2020-11-28 NOTE — Therapy (Signed)
[facility] [address] [place], [place], [address] Phone: [phone]   Fax:  [phone]  Physical Therapy Treatment  Patient Details  Name: Benjamin Davila MRN: [id number] Date of Birth: 21-Apr-1941 Referring Provider (PT): Melina Schools, MD   Encounter Date: 11/28/2020   PT End of Session - 11/28/20 1157     Visit Number 12    Number of Visits 13    Date for PT Re-Evaluation 12/02/20    Authorization Type BCBS MCR    Progress Note Due on Visit 10    PT Start Time 1696    PT Stop Time 1229    PT Time Calculation (min) 33 min    Activity Tolerance Patient tolerated treatment well    Behavior During Therapy [facility] for tasks assessed/performed             Past Medical History:  Diagnosis Date   Aortic stenosis, moderate 03/17/2019   Autoimmune hepatitis ([place]) 07/12/2020   Benign prostatic hyperplasia without lower urinary tract symptoms 07/12/2020   Cardiac murmur 12/05/2018   Diabetes mellitus due to underlying condition with unspecified complications ([place]) 7/89/3810   Essential hypertension 12/05/2018   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   History of pancreatitis 07/12/2020   History of pancytopenia 07/12/2020   Hyperlipidemia, unspecified 07/12/2020   Irregular heart beat 07/12/2020   Long term (current) use of insulin ([place]) 07/12/2020   Malignant neoplasm of prostate ([place]) 06/24/2019   Mood disorder ([place]) 07/12/2020   Prostate cancer ([place])    Systolic murmur 1/75/1025   Type 2 diabetes mellitus with other specified complication ([place]) 8/52/7782    Past Surgical History:  Procedure Laterality Date   NO PAST SURGERIES      There were no vitals filed for this visit.       [facility] PT Assessment - 11/28/20 0001       Posture/Postural Control   Posture Comments resolution of flexed posture, bil genu varus noted in gait      Palpation   Palpation comment some TTP at "belt region" of lower back                            [facility] Adult PT Treatment/Exercise - 11/28/20 0001       Exercises   Exercises Other Exercises    Other Exercises  see medbridge list                      PT Short Term Goals - 11/10/20 2027       PT SHORT TERM GOAL #1   Title pt will report ability to participate in pool activities without incr in lumbar and hip pain    Status Achieved               PT Long Term Goals - 11/28/20 1238       PT LONG TERM GOAL #1   Title pt will return to activity level as in PLOF    Baseline not quite there but working on it    Status On-going      PT LONG TERM GOAL #2   Title independent in [place] #3   Title resolution of constant Lt lateral hip pain    Baseline can feel it, unsure if it is better or not. complains more about belt pain    Status On-going  Plan - 11/28/20 1233     Clinical Impression Statement Advanced HEP to give more options for exercises as requested by pt. He did not want to go through multiple repetitions of each but reports he has the right idea. Plans to continue with trial days at the gym to determine if he wants a membership. Discussed recognizing pain vs soreness, frequent mobility, approach to improving endurance and long term strength plans. Pt will complete one more aquatic program in order to finalize HEP and then d/c.    PT Treatment/Interventions ADLs/Self Care Home Management;Cryotherapy;Moist Heat;Iontophoresis 4mg /ml Dexamethasone;Electrical Stimulation;Stair training;Gait training;Therapeutic activities;Therapeutic exercise;Neuromuscular re-education;Patient/family education;Passive range of motion;Manual techniques;Dry needling;Taping;Aquatic Therapy    PT Next Visit Plan d/c    PT Home Exercise Plan [email or web address]    Consulted and Agree with Plan of Care Patient             Patient will benefit from skilled therapeutic intervention in order  to improve the following deficits and impairments:  Difficulty walking, Increased muscle spasms, Improper body mechanics, Decreased activity tolerance, Decreased strength, Impaired flexibility, Postural dysfunction, Pain  Visit Diagnosis: Muscle weakness (generalized)  Pain in left hip  Chronic 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  Difficulty in walking, not elsewhere classified     Problem List Patient Active Problem List   Diagnosis Date Noted   Aortic regurgitation 07/14/2020   Autoimmune hepatitis ([place]) 07/12/2020   Benign prostatic hyperplasia without lower urinary tract symptoms 07/12/2020   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   History of pancreatitis 07/12/2020   History of pancytopenia 07/12/2020   Hyperlipidemia, unspecified 07/12/2020   Irregular heart beat 07/12/2020   Long term (current) use of insulin ([place]) 07/12/2020   Mood disorder ([place]) 91/63/8466   Systolic murmur 59/93/5701   Type 2 diabetes mellitus with other specified complication ([place]) 77/93/9030   Prostate cancer ([place])    Malignant neoplasm of prostate ([place]) 06/24/2019   Aortic stenosis, moderate 03/17/2019   Cardiac murmur 12/05/2018   Essential hypertension 12/05/2018   Diabetes mellitus due to underlying condition with unspecified complications ([place]) 02/10/3006   Kaylamarie Swickard C. Jodeen Mclin PT, DPT 11/28/20 12:40 PM   [facility] [address] [place], [place], [address] Phone: [phone]   Fax:  [phone]  Name: Benjamin Davila MRN: [id number] Date of Birth: Jul 27, 1940

## 2020-11-29 ENCOUNTER — Ambulatory Visit (HOSPITAL_BASED_OUTPATIENT_CLINIC_OR_DEPARTMENT_OTHER): Payer: Medicare Other | Admitting: Physical Therapy

## 2020-11-30 ENCOUNTER — Other Ambulatory Visit: Payer: Self-pay

## 2020-11-30 ENCOUNTER — Encounter (HOSPITAL_BASED_OUTPATIENT_CLINIC_OR_DEPARTMENT_OTHER): Payer: Self-pay | Admitting: Physical Therapy

## 2020-11-30 ENCOUNTER — Ambulatory Visit (HOSPITAL_BASED_OUTPATIENT_CLINIC_OR_DEPARTMENT_OTHER): Payer: Medicare Other | Admitting: Physical Therapy

## 2020-11-30 DIAGNOSIS — R262 Difficulty in walking, not elsewhere classified: Secondary | ICD-10-CM

## 2020-11-30 DIAGNOSIS — G8929 Other chronic pain: Secondary | ICD-10-CM

## 2020-11-30 DIAGNOSIS — M6281 Muscle weakness (generalized): Secondary | ICD-10-CM

## 2020-11-30 DIAGNOSIS — M25552 Pain in left hip: Secondary | ICD-10-CM

## 2020-11-30 NOTE — Therapy (Signed)
[place], [place], [address] Phone: [phone]   Fax:  [phone]  Physical Therapy Treatment/DC  Patient Details  Name: Benjamin Davila MRN: [id number] Date of Birth: [birth year] Referring Provider (PT): Melina Schools, MD   Encounter Date: 11/30/2020  Subjective This is my last visit.    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     TREATMENT Pt seen for aquatic therapy today.  Treatment took place in water 3.25-4.8 ft in depth at the [facility] pool. Temp of water was 91.  Pt entered/exited the pool via stairs step through pattern independently with bilat rail.    Re-instructed pt on HEP as printed by land PT.  PT demonstrates exercises f/b pt completing with tactile and verbal cues for correct technique. Warm up Water walking 12 widths of pool forward, backward and sidestepping with queuing for increased speed and hand placement for resistance.   Superman/Prone suspension LB stretch knees to chest stabilized by  therapist 3x 30 sec hold Knees to chest, rotating right and then left x 10 reps each stabilized by PT  Pt requires buoyancy for support and to offload joints with strengthening exercises. Viscosity of the water is needed for resistance of strengthening; water current perturbations provides challenge to standing balance unsupported, requiring increased core activation.                               PT Short Term Goals - 11/10/20 2027       PT SHORT TERM GOAL #1   Title pt will report ability to participate in pool activities without incr in lumbar and hip pain    Status Achieved               PT Long Term Goals - 11/30/20 1405       PT LONG TERM GOAL #1   Status Not Met      PT LONG TERM GOAL #2   Status Achieved      PT LONG TERM GOAL #3   Status Not Met            Clinical Assessment Re-instructed pt on HEP as he brought copy with questions. Given clarification, verbal and demonstration. Discharge instructions given to contune with HEP and call with any question. All goals not met due to pts request for early unanticipated DC.         Patient will benefit from skilled therapeutic intervention in order to improve the following deficits and impairments:  Difficulty walking, Increased muscle spasms, Improper body mechanics, Decreased activity  tolerance, Decreased strength, Impaired flexibility, Postural dysfunction, Pain  Visit Diagnosis: Muscle weakness (generalized)  Pain in left hip  Chronic 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  Difficulty in walking, not elsewhere classified     Problem List Patient Active Problem List   Diagnosis Date Noted   Aortic regurgitation 07/14/2020   Autoimmune hepatitis ([place]) 07/12/2020   Benign prostatic hyperplasia without lower urinary tract symptoms 07/12/2020   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   History of pancreatitis 07/12/2020   History of pancytopenia 07/12/2020   Hyperlipidemia,  unspecified 07/12/2020   Irregular heart beat 07/12/2020   Long term (current) use of insulin ([place]) 07/12/2020   Mood disorder ([place]) 40/98/2867   Systolic murmur 51/98/2429   Type 2 diabetes mellitus with other specified complication ([place]) 98/10/9994   Prostate cancer ([place])    Malignant neoplasm of prostate ([place]) 06/24/2019   Aortic stenosis, moderate 03/17/2019   Cardiac murmur 12/05/2018   Essential hypertension 12/05/2018   Diabetes mellitus due to underlying condition with unspecified complications ([place]) 72/27/7375   PHYSICAL THERAPY DISCHARGE SUMMARY  Visits from Start of Care: 13  Current functional level related to goals / functional outcomes: Pt indpendant with functional mobility and ADL's.  Strength improved, Pt indep with HEP   Remaining deficits: Considerable pain, decreased ambulation ability   Education / Equipment: HEP, disease process and management   Patient agrees to discharge. Patient goals were partially met. Patient is being discharged due to the patient's request.  Vedia Pereyra  MPT 12/08/2020, 2:14 PM  [facility] [address] [place], [place], [address] Phone: [phone]   Fax:  [phone]  Name: Benjamin Davila MRN: [id number] Date of Birth: 1940/07/31

## 2020-12-01 ENCOUNTER — Ambulatory Visit (HOSPITAL_BASED_OUTPATIENT_CLINIC_OR_DEPARTMENT_OTHER): Payer: Medicare Other | Admitting: Physical Therapy

## 2020-12-06 ENCOUNTER — Encounter (HOSPITAL_BASED_OUTPATIENT_CLINIC_OR_DEPARTMENT_OTHER): Payer: Medicare Other | Admitting: Physical Therapy

## 2020-12-08 ENCOUNTER — Ambulatory Visit (HOSPITAL_BASED_OUTPATIENT_CLINIC_OR_DEPARTMENT_OTHER): Payer: Medicare Other | Admitting: Physical Therapy

## 2021-01-03 ENCOUNTER — Emergency Department (HOSPITAL_BASED_OUTPATIENT_CLINIC_OR_DEPARTMENT_OTHER): Payer: Medicare Other | Admitting: Radiology

## 2021-01-03 ENCOUNTER — Emergency Department (HOSPITAL_BASED_OUTPATIENT_CLINIC_OR_DEPARTMENT_OTHER)
Admission: EM | Admit: 2021-01-03 | Discharge: 2021-01-03 | Disposition: A | Discharge status: Home / Self Care | Payer: Medicare Other | Attending: Emergency Medicine | Admitting: Emergency Medicine

## 2021-01-03 ENCOUNTER — Other Ambulatory Visit: Payer: Self-pay

## 2021-01-03 ENCOUNTER — Encounter (HOSPITAL_BASED_OUTPATIENT_CLINIC_OR_DEPARTMENT_OTHER): Payer: Self-pay

## 2021-01-03 DIAGNOSIS — Z79899 Other long term (current) drug therapy: Secondary | ICD-10-CM | POA: Insufficient documentation

## 2021-01-03 DIAGNOSIS — E785 Hyperlipidemia, unspecified: Secondary | ICD-10-CM | POA: Diagnosis not present

## 2021-01-03 DIAGNOSIS — I10 Essential (primary) hypertension: Secondary | ICD-10-CM | POA: Diagnosis not present

## 2021-01-03 DIAGNOSIS — Z8546 Personal history of malignant neoplasm of prostate: Secondary | ICD-10-CM | POA: Insufficient documentation

## 2021-01-03 DIAGNOSIS — E1169 Type 2 diabetes mellitus with other specified complication: Secondary | ICD-10-CM | POA: Insufficient documentation

## 2021-01-03 DIAGNOSIS — S6990XA Unspecified injury of unspecified wrist, hand and finger(s), initial encounter: Secondary | ICD-10-CM

## 2021-01-03 DIAGNOSIS — X58XXXA Exposure to other specified factors, initial encounter: Secondary | ICD-10-CM | POA: Insufficient documentation

## 2021-01-03 DIAGNOSIS — S6992XA Unspecified injury of left wrist, hand and finger(s), initial encounter: Secondary | ICD-10-CM | POA: Insufficient documentation

## 2021-01-03 DIAGNOSIS — Z7984 Long term (current) use of oral hypoglycemic drugs: Secondary | ICD-10-CM | POA: Diagnosis not present

## 2021-01-03 DIAGNOSIS — Z794 Long term (current) use of insulin: Secondary | ICD-10-CM | POA: Insufficient documentation

## 2021-01-03 NOTE — ED Triage Notes (Signed)
Pt states he injured his left thumb yesterday while trying to get up out of a chair.  He reports continued thumb pain with movement today.  He tried ice with no relief.

## 2021-01-03 NOTE — ED Notes (Signed)
Verbalized understanding discharge instructions, pain management, and follow-up. In no acute distress.

## 2021-01-03 NOTE — Discharge Instructions (Addendum)
Recommend taking Tylenol, Motrin as needed for pain.  Rest, ice and elevate.  If you are continuing to have pain or any issues with mobility over the next couple days, I would recommend following up with a hand specialist.

## 2021-01-04 NOTE — ED Provider Notes (Signed)
[facility] EMERGENCY DEPT Provider Note   CSN: [id number] Arrival date & time: 01/03/21  1110     History Chief Complaint  Patient presents with   Finger Injury    Cotter Ligon is a 80 y.o. male.  To the emergency room with concern for finger injury.  Patient states that yesterday he accidentally sat on his thumb in an awkward fashion while getting out of a chair.  States that today has continued to have some pain in his thumb.  No numbness, weakness.  Otherwise doing well.   HPI     Past Medical History:  Diagnosis Date   Aortic stenosis, moderate 03/17/2019   Autoimmune hepatitis ([place]) 07/12/2020   Benign prostatic hyperplasia without lower urinary tract symptoms 07/12/2020   Cardiac murmur 12/05/2018   Diabetes mellitus due to underlying condition with unspecified complications ([place]) [id number]   Essential hypertension 12/05/2018   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   History of pancreatitis 07/12/2020   History of pancytopenia 07/12/2020   Hyperlipidemia, unspecified 07/12/2020   Irregular heart beat 07/12/2020   Long term (current) use of insulin ([place]) 07/12/2020   Malignant neoplasm of prostate ([place]) 06/24/2019   Mood disorder ([place]) 07/12/2020   Prostate cancer ([place])    Systolic murmur [id number]   Type 2 diabetes mellitus with other specified complication ([place]) [id number]    Patient Active Problem List   Diagnosis Date Noted   Aortic regurgitation 07/14/2020   Autoimmune hepatitis ([place]) 07/12/2020   Benign prostatic hyperplasia without lower urinary tract symptoms 07/12/2020   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   History of pancreatitis 07/12/2020   History of pancytopenia 07/12/2020   Hyperlipidemia, unspecified 07/12/2020   Irregular heart beat 07/12/2020   Long term (current) use of insulin ([place]) 07/12/2020   Mood disorder ([place]) [id number]   Systolic murmur [id number]   Type 2 diabetes mellitus with other specified complication ([place]) [id number]   Prostate cancer  ([place])    Malignant neoplasm of prostate ([place]) 06/24/2019   Aortic stenosis, moderate 03/17/2019   Cardiac murmur 12/05/2018   Essential hypertension 12/05/2018   Diabetes mellitus due to underlying condition with unspecified complications ([place]) 0000000    Past Surgical History:  Procedure Laterality Date   NO PAST SURGERIES         No family history on file.  Social History   Tobacco Use   Smoking status: Never   Smokeless tobacco: Never  Vaping Use   Vaping Use: Never used  Substance Use Topics   Alcohol use: Not Currently   Drug use: Never    Home Medications Prior to Admission medications   Medication Sig Start Date End Date Taking? Authorizing Provider  atorvastatin (LIPITOR) 20 MG tablet Take 1 tablet by mouth daily. 11/18/18   [provider]  insulin glargine (LANTUS SOLOSTAR) 100 UNIT/ML Solostar Pen Inject 24 Units into the skin daily.    [provider]  losartan (COZAAR) 50 MG tablet Take 50 mg by mouth daily.    [provider]  metFORMIN (GLUCOPHAGE) 500 MG tablet Take 1 tablet by mouth 2 (two) times a day. 11/18/18   [provider]    Allergies    Lisinopril  Review of Systems   Review of Systems  Musculoskeletal:  Positive for arthralgias.  All other systems reviewed and are negative.  Physical Exam Updated Vital Signs BP (!) 181/97   Pulse 99   Temp 98.3 F (36.8 C)   Resp 16   Ht '5\' 10"'$  (1.778 m)  Wt 81.6 kg   SpO2 99%   BMI 25.83 kg/m   Physical Exam Vitals and nursing note reviewed.  Constitutional:      Appearance: He is well-developed.  HENT:     Head: Normocephalic and atraumatic.  Eyes:     Conjunctiva/sclera: Conjunctivae normal.  Cardiovascular:     Rate and Rhythm: Normal rate and regular rhythm.     Heart sounds: No murmur heard. Pulmonary:     Effort: Pulmonary effort is normal. No respiratory distress.     Breath sounds: Normal breath sounds.  Abdominal:     Palpations:  Abdomen is soft.     Tenderness: There is no abdominal tenderness.  Musculoskeletal:     Cervical back: Neck supple.     Comments: LUE: Some tenderness to the left thumb but no significant deformity appreciated, normal ROM throughout all fingers, normal sensation and cap refill  Skin:    General: Skin is warm and dry.  Neurological:     Mental Status: He is alert.    ED Results / Procedures / Treatments   Labs (all labs ordered are listed, but only abnormal results are displayed) Labs Reviewed - No data to display  EKG None  Radiology DG Finger Thumb Left  Result Date: 01/03/2021 CLINICAL DATA:  Left thumb pain. EXAM: LEFT THUMB 2+V COMPARISON:  None. FINDINGS: No acute fracture or dislocation. 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 Soft tissues are unremarkable. IMPRESSION: 1. No acute osseous abnormality. Electronically Signed   By: Titus Dubin M.D.   On: 01/03/2021 11:59    Procedures Procedures   Medications Ordered in ED Medications - No data to display  ED Course  I have reviewed the triage vital signs and the nursing notes.  Pertinent labs & imaging results that were available during my care of the patient were reviewed by me and considered in my medical decision making (see chart for details).    MDM Rules/Calculators/A&P                           80 year old male presented to ER with concern for possible left thumb injury.  Had some tenderness on exam but no significant deformity was appreciated.  X-ray negative.  Suspect MSK strain.  Recommend supportive care for now.  Discharged home.    After the discussed management above, the patient was determined to be safe for discharge.  The patient was in agreement with this plan and all questions regarding their care were answered.  ED return precautions were discussed and the patient will return to the ED with any significant worsening of condition.  Final Clinical Impression(s) / ED  Diagnoses Final diagnoses:  Finger injury, initial encounter    Rx / DC Orders ED Discharge Orders     None        Lucrezia Starch, MD 01/04/21 1016

## 2021-02-14 ENCOUNTER — Telehealth: Payer: Self-pay | Admitting: *Deleted

## 2021-02-14 DIAGNOSIS — D649 Anemia, unspecified: Secondary | ICD-10-CM

## 2021-02-14 NOTE — Telephone Encounter (Signed)
[facility]ified [facility] that patient will be seen on 02/17/21, which is our first available but if his Hgb 7.4 warrants any intervention prior to being able to see him, they would need to do so.  They have no other CBC results or any GI notes. He is a relatively new patient with them.

## 2021-02-15 ENCOUNTER — Telehealth: Payer: Self-pay | Admitting: *Deleted

## 2021-02-15 ENCOUNTER — Inpatient Hospital Stay: Payer: Medicare Other | Attending: Oncology

## 2021-02-15 ENCOUNTER — Encounter: Payer: Self-pay | Admitting: *Deleted

## 2021-02-15 ENCOUNTER — Other Ambulatory Visit: Payer: Self-pay

## 2021-02-15 DIAGNOSIS — Z8719 Personal history of other diseases of the digestive system: Secondary | ICD-10-CM | POA: Diagnosis not present

## 2021-02-15 DIAGNOSIS — C61 Malignant neoplasm of prostate: Secondary | ICD-10-CM | POA: Diagnosis not present

## 2021-02-15 DIAGNOSIS — I350 Nonrheumatic aortic (valve) stenosis: Secondary | ICD-10-CM | POA: Diagnosis not present

## 2021-02-15 DIAGNOSIS — D509 Iron deficiency anemia, unspecified: Secondary | ICD-10-CM | POA: Insufficient documentation

## 2021-02-15 DIAGNOSIS — Z79899 Other long term (current) drug therapy: Secondary | ICD-10-CM | POA: Insufficient documentation

## 2021-02-15 DIAGNOSIS — R0609 Other forms of dyspnea: Secondary | ICD-10-CM | POA: Diagnosis not present

## 2021-02-15 DIAGNOSIS — D649 Anemia, unspecified: Secondary | ICD-10-CM

## 2021-02-15 DIAGNOSIS — R5383 Other fatigue: Secondary | ICD-10-CM | POA: Diagnosis not present

## 2021-02-15 DIAGNOSIS — Z888 Allergy status to other drugs, medicaments and biological substances status: Secondary | ICD-10-CM | POA: Diagnosis not present

## 2021-02-15 DIAGNOSIS — E119 Type 2 diabetes mellitus without complications: Secondary | ICD-10-CM | POA: Diagnosis not present

## 2021-02-15 DIAGNOSIS — I10 Essential (primary) hypertension: Secondary | ICD-10-CM | POA: Insufficient documentation

## 2021-02-15 DIAGNOSIS — Z923 Personal history of irradiation: Secondary | ICD-10-CM | POA: Diagnosis not present

## 2021-02-15 DIAGNOSIS — R0602 Shortness of breath: Secondary | ICD-10-CM | POA: Insufficient documentation

## 2021-02-15 DIAGNOSIS — N400 Enlarged prostate without lower urinary tract symptoms: Secondary | ICD-10-CM | POA: Diagnosis not present

## 2021-02-15 DIAGNOSIS — R195 Other fecal abnormalities: Secondary | ICD-10-CM | POA: Insufficient documentation

## 2021-02-15 DIAGNOSIS — E785 Hyperlipidemia, unspecified: Secondary | ICD-10-CM | POA: Diagnosis not present

## 2021-02-15 DIAGNOSIS — K754 Autoimmune hepatitis: Secondary | ICD-10-CM | POA: Diagnosis not present

## 2021-02-15 LAB — URINALYSIS, COMPLETE (UACMP) WITH MICROSCOPIC
Bilirubin Urine: NEGATIVE
Glucose, UA: >1000 mg/dL — AB
Hgb urine dipstick: NEGATIVE
Ketones, ur: NEGATIVE mg/dL
Leukocytes,Ua: NEGATIVE
Nitrite: NEGATIVE
Protein, ur: NEGATIVE mg/dL
Specific Gravity, Urine: 1.022 (ref 1.005–1.030)
pH: 6 (ref 5.0–8.0)

## 2021-02-15 LAB — CBC WITH DIFFERENTIAL (CANCER CENTER ONLY)
Abs Immature Granulocytes: 0.02 10*3/uL (ref 0.00–0.07)
Basophils Absolute: 0 10*3/uL (ref 0.0–0.1)
Basophils Relative: 1 %
Eosinophils Absolute: 0.3 10*3/uL (ref 0.0–0.5)
Eosinophils Relative: 9 %
HCT: 22.8 % — ABNORMAL LOW (ref 39.0–52.0)
Hemoglobin: 6.8 g/dL — CL (ref 13.0–17.0)
Immature Granulocytes: 1 %
Lymphocytes Relative: 25 %
Lymphs Abs: 0.9 10*3/uL (ref 0.7–4.0)
MCH: 24.7 pg — ABNORMAL LOW (ref 26.0–34.0)
MCHC: 29.8 g/dL — ABNORMAL LOW (ref 30.0–36.0)
MCV: 82.9 fL (ref 80.0–100.0)
Monocytes Absolute: 0.5 10*3/uL (ref 0.1–1.0)
Monocytes Relative: 13 %
Neutro Abs: 1.9 10*3/uL (ref 1.7–7.7)
Neutrophils Relative %: 51 %
Platelet Count: 205 10*3/uL (ref 150–400)
RBC: 2.75 MIL/uL — ABNORMAL LOW (ref 4.22–5.81)
RDW: 14.7 % (ref 11.5–15.5)
WBC Count: 3.7 10*3/uL — ABNORMAL LOW (ref 4.0–10.5)
nRBC: 0 % (ref 0.0–0.2)

## 2021-02-15 LAB — SAVE SMEAR(SSMR), FOR PROVIDER SLIDE REVIEW

## 2021-02-15 NOTE — Progress Notes (Signed)
S/W pt after receiving lab report of Hgb 6.8. Patient stated that he "doesn't feel any different than I felt last week, I feel tired when gardening and am napping alittle more than usual", He reports no bleeding evident in stool or urine. Reviewed with Ned Card NP and instructed patient if he starts to feel worse, any SOB, or notices any bleeding to please report to emergency room. Also instructed him to start Ferrous Sulfate 325mg  BID tonight. Verbalized understanding.

## 2021-02-15 NOTE — Progress Notes (Signed)
CRITICAL VALUE STICKER  CRITICAL VALUE: Hgb 6.8  RECEIVER (on-site recipient of call):Suriya Kovarik,RN  DATE & TIME NOTIFIED: 02/15/21 @ 1450  MESSENGER (representative from lab): Tarry Kos  MD NOTIFIED: Ned Card, NP  TIME OF NOTIFICATION: 1500  RESPONSE: Confirmed w/patient he is not symptomatic. Will be seen on 9/30 as new patient.

## 2021-02-17 ENCOUNTER — Other Ambulatory Visit: Payer: Self-pay

## 2021-02-17 ENCOUNTER — Inpatient Hospital Stay (HOSPITAL_BASED_OUTPATIENT_CLINIC_OR_DEPARTMENT_OTHER): Payer: Medicare Other | Admitting: Nurse Practitioner

## 2021-02-17 ENCOUNTER — Encounter: Payer: Self-pay | Admitting: Nurse Practitioner

## 2021-02-17 ENCOUNTER — Inpatient Hospital Stay: Payer: Medicare Other

## 2021-02-17 VITALS — BP 129/71 | HR 101 | Temp 97.8°F | Resp 18 | Ht 70.0 in | Wt 189.0 lb

## 2021-02-17 DIAGNOSIS — E785 Hyperlipidemia, unspecified: Secondary | ICD-10-CM

## 2021-02-17 DIAGNOSIS — R5383 Other fatigue: Secondary | ICD-10-CM

## 2021-02-17 DIAGNOSIS — E119 Type 2 diabetes mellitus without complications: Secondary | ICD-10-CM | POA: Diagnosis not present

## 2021-02-17 DIAGNOSIS — N400 Enlarged prostate without lower urinary tract symptoms: Secondary | ICD-10-CM | POA: Diagnosis not present

## 2021-02-17 DIAGNOSIS — C61 Malignant neoplasm of prostate: Secondary | ICD-10-CM | POA: Diagnosis not present

## 2021-02-17 DIAGNOSIS — D649 Anemia, unspecified: Secondary | ICD-10-CM | POA: Diagnosis not present

## 2021-02-17 DIAGNOSIS — Z79899 Other long term (current) drug therapy: Secondary | ICD-10-CM

## 2021-02-17 DIAGNOSIS — R0600 Dyspnea, unspecified: Secondary | ICD-10-CM

## 2021-02-17 DIAGNOSIS — Z8546 Personal history of malignant neoplasm of prostate: Secondary | ICD-10-CM

## 2021-02-17 DIAGNOSIS — I10 Essential (primary) hypertension: Secondary | ICD-10-CM

## 2021-02-17 DIAGNOSIS — Z8719 Personal history of other diseases of the digestive system: Secondary | ICD-10-CM

## 2021-02-17 LAB — CBC WITH DIFFERENTIAL (CANCER CENTER ONLY)
Abs Immature Granulocytes: 0.03 10*3/uL (ref 0.00–0.07)
Basophils Absolute: 0 10*3/uL (ref 0.0–0.1)
Basophils Relative: 1 %
Eosinophils Absolute: 0.3 10*3/uL (ref 0.0–0.5)
Eosinophils Relative: 7 %
HCT: 23.9 % — ABNORMAL LOW (ref 39.0–52.0)
Hemoglobin: 7.2 g/dL — ABNORMAL LOW (ref 13.0–17.0)
Immature Granulocytes: 1 %
Lymphocytes Relative: 20 %
Lymphs Abs: 0.8 10*3/uL (ref 0.7–4.0)
MCH: 25.3 pg — ABNORMAL LOW (ref 26.0–34.0)
MCHC: 30.1 g/dL (ref 30.0–36.0)
MCV: 83.9 fL (ref 80.0–100.0)
Monocytes Absolute: 0.5 10*3/uL (ref 0.1–1.0)
Monocytes Relative: 11 %
Neutro Abs: 2.5 10*3/uL (ref 1.7–7.7)
Neutrophils Relative %: 60 %
Platelet Count: 201 10*3/uL (ref 150–400)
RBC: 2.85 MIL/uL — ABNORMAL LOW (ref 4.22–5.81)
RDW: 15.1 % (ref 11.5–15.5)
WBC Count: 4.1 10*3/uL (ref 4.0–10.5)
nRBC: 0 % (ref 0.0–0.2)

## 2021-02-17 LAB — SAMPLE TO BLOOD BANK

## 2021-02-17 NOTE — Progress Notes (Signed)
New Hematology/Oncology Consult   Requesting MD: Jillyn Ledger FNP  [phone]  Reason for Consult: Anemia  HPI: Mr. Hanna has been referred for evaluation of severe anemia.  He had a routine visit with PCP 02/06/2021.  CBC returned with a hemoglobin of 7.4, MCV 82, white count 3.4 and platelet count 210,000; chemistry panel normal except AST 86 and ALT 88; ferritin returned at 7.1, serum iron 20, iron saturation 3%, TIBC 608; B12 1238, serum folate normal.  He reports a decrease in stamina and increased shortness of breath mid-summer.  No chest pain.  He is not aware of any bleeding.  Specifically no black stools.  He reports stool cards that he recently returned to PCP office returned positive for blood on 2 of the cards and negative on 1.  He estimates last colonoscopy 12 years ago.  No recent change in bowel habits.  No dysphagia.  No nausea or vomiting.  No abdominal pain.  He does not take a blood thinner.    Past Medical History:  Diagnosis Date   Aortic stenosis, moderate 03/17/2019   Autoimmune hepatitis ([place]) 07/12/2020   Benign prostatic hyperplasia without lower urinary tract symptoms 07/12/2020   Cardiac murmur 12/05/2018   Diabetes mellitus due to underlying condition with unspecified complications ([place]) 2/54/2706   Essential hypertension 12/05/2018   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   History of pancreatitis 07/12/2020   History of pancytopenia 07/12/2020   Hyperlipidemia, unspecified 07/12/2020   Irregular heart beat 07/12/2020   Long term (current) use of insulin ([place]) 07/12/2020   Malignant neoplasm of prostate ([place]) 06/24/2019   Mood disorder ([place]) 07/12/2020   Prostate cancer ([place])    Systolic murmur 2/37/6283   Type 2 diabetes mellitus with other specified complication ([place]) 1/51/7616  :   Past Surgical History:  Procedure Laterality Date   NO PAST SURGERIES    :   Current Outpatient Medications:    atorvastatin (LIPITOR) 20 MG tablet, Take 1 tablet by mouth daily.,  Disp: , Rfl:    diazepam (VALIUM) 5 MG tablet, Take 5 mg by mouth daily as needed., Disp: , Rfl:    insulin glargine (LANTUS SOLOSTAR) 100 UNIT/ML Solostar Pen, Inject 24 Units into the skin daily., Disp: , Rfl:    losartan (COZAAR) 100 MG tablet, Take 100 mg by mouth daily., Disp: , Rfl:    losartan (COZAAR) 50 MG tablet, Take 50 mg by mouth daily., Disp: , Rfl:    metFORMIN (GLUCOPHAGE) 500 MG tablet, Take 1 tablet by mouth 2 (two) times a day., Disp: , Rfl:    tamsulosin (FLOMAX) 0.4 MG CAPS capsule, Take 0.8 mg by mouth daily., Disp: , Rfl:    triamcinolone cream (KENALOG) 0.1 %, Apply topically 2 (two) times daily., Disp: , Rfl: :   Allergies  Allergen Reactions   Lisinopril Cough    Other reaction(s): cough  :  FH: Mother died age 57 with lupus.  No family history of malignancy.  SOCIAL HISTORY: He lives in [place].  He is married.  He previously taught school.  No alcohol or tobacco use.  Review of Systems: In addition to the above he denies fever/sweats.  No anorexia or weight loss.  He denies pain.  No hematuria.  Physical Exam:  Blood pressure 129/71, pulse (!) 101, temperature 97.8 F (36.6 C), temperature source Oral, resp. rate 18, height 5\' 10"  (1.778 m), weight 189 lb (85.7 kg), SpO2 100 %.  HEENT: Conjunctival pallor. Lungs: Lungs clear bilaterally. Cardiac: Regular rate and  rhythm.  2/6 systolic murmur. Abdomen: Abdomen soft and nontender.  No hepatosplenomegaly.  No mass. Vascular: No leg edema. Lymph nodes: No palpable cervical, supraclavicular, axillary or inguinal lymph nodes. Neurologic: Alert and oriented. Skin: Pale appearing.  Nose and malar regions with mild erythema.   LABS:   Recent Labs    02/15/21 1316 02/17/21 1117  WBC 3.7* 4.1  HGB 6.8* 7.2*  HCT 22.8* 23.9*  PLT 205 201  Peripheral blood smear-platelets normal in number; white blood cell morphology unremarkable; Red cells hypochromic, ovalocytes, increased polychromasia, target  cells, few teardrops, variation in red cell size with microcytes.   RADIOLOGY:  No results found.  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  He will increase oral iron to 3 times daily.  He has been referred to gastroenterology, thinks he has appointment in November.  We will try to expedite the appointment.   We discussed risk/benefit of a blood transfusion.  We will hold on transfusion support for now.  He understands to contact the office if current symptoms worsen.  He will return for lab and follow-up in 2 weeks.  We are available to see him sooner if needed.  Patient seen with Dr. Benay Spice.  Ned Card, NP 02/17/2021, 11:49 AM   This was a shared visit with Ned Card.  Mr. Lindahl was interviewed and examined.  I reviewed the peripheral blood smear.  He is referred for evaluation of iron deficiency anemia.  The peripheral blood smear is consistent with iron deficiency anemia.  He has started a trial of ferrous sulfate.  He has exertional dyspnea.  This has been present for several months.  He will call for increased symptoms of anemia and we will arrange for a Red cell transfusion.  He needs an urgent GI evaluation.  I suspect he has a GI source of chronic blood loss.  I was present for greater than 50% of today's visit.  I performed medical decision making.  Julieanne Manson, MD

## 2021-02-17 NOTE — Progress Notes (Signed)
Orders entered for visit 02/17/2021

## 2021-02-20 ENCOUNTER — Encounter: Payer: Self-pay | Admitting: *Deleted

## 2021-02-20 NOTE — Progress Notes (Signed)
Received message from [name] that patient Benjamin Davila is scheduled to be seen by Vicie Mutters PA-C on 03/31/21  Called [facility] and he will now be seen Dr Cathren Laine on 10/5 at 9:30

## 2021-03-03 ENCOUNTER — Other Ambulatory Visit: Payer: Self-pay

## 2021-03-03 ENCOUNTER — Inpatient Hospital Stay: Payer: Medicare Other | Attending: Nurse Practitioner | Admitting: Nurse Practitioner

## 2021-03-03 ENCOUNTER — Encounter: Payer: Self-pay | Admitting: Nurse Practitioner

## 2021-03-03 ENCOUNTER — Inpatient Hospital Stay: Payer: Medicare Other

## 2021-03-03 VITALS — BP 135/73 | HR 92 | Temp 97.8°F | Resp 18 | Ht 70.0 in | Wt 183.2 lb

## 2021-03-03 DIAGNOSIS — I10 Essential (primary) hypertension: Secondary | ICD-10-CM | POA: Insufficient documentation

## 2021-03-03 DIAGNOSIS — K754 Autoimmune hepatitis: Secondary | ICD-10-CM | POA: Insufficient documentation

## 2021-03-03 DIAGNOSIS — Z8719 Personal history of other diseases of the digestive system: Secondary | ICD-10-CM | POA: Insufficient documentation

## 2021-03-03 DIAGNOSIS — D649 Anemia, unspecified: Secondary | ICD-10-CM | POA: Diagnosis not present

## 2021-03-03 DIAGNOSIS — E119 Type 2 diabetes mellitus without complications: Secondary | ICD-10-CM | POA: Insufficient documentation

## 2021-03-03 DIAGNOSIS — I350 Nonrheumatic aortic (valve) stenosis: Secondary | ICD-10-CM | POA: Insufficient documentation

## 2021-03-03 DIAGNOSIS — R5383 Other fatigue: Secondary | ICD-10-CM | POA: Insufficient documentation

## 2021-03-03 DIAGNOSIS — R0609 Other forms of dyspnea: Secondary | ICD-10-CM | POA: Insufficient documentation

## 2021-03-03 DIAGNOSIS — N400 Enlarged prostate without lower urinary tract symptoms: Secondary | ICD-10-CM | POA: Insufficient documentation

## 2021-03-03 DIAGNOSIS — C61 Malignant neoplasm of prostate: Secondary | ICD-10-CM | POA: Insufficient documentation

## 2021-03-03 DIAGNOSIS — Z923 Personal history of irradiation: Secondary | ICD-10-CM | POA: Insufficient documentation

## 2021-03-03 DIAGNOSIS — D509 Iron deficiency anemia, unspecified: Secondary | ICD-10-CM | POA: Diagnosis present

## 2021-03-03 LAB — CBC WITH DIFFERENTIAL (CANCER CENTER ONLY)
Abs Immature Granulocytes: 0.02 10*3/uL (ref 0.00–0.07)
Basophils Absolute: 0 10*3/uL (ref 0.0–0.1)
Basophils Relative: 1 %
Eosinophils Absolute: 0.1 10*3/uL (ref 0.0–0.5)
Eosinophils Relative: 4 %
HCT: 30.7 % — ABNORMAL LOW (ref 39.0–52.0)
Hemoglobin: 9.3 g/dL — ABNORMAL LOW (ref 13.0–17.0)
Immature Granulocytes: 1 %
Lymphocytes Relative: 22 %
Lymphs Abs: 0.8 10*3/uL (ref 0.7–4.0)
MCH: 27.8 pg (ref 26.0–34.0)
MCHC: 30.3 g/dL (ref 30.0–36.0)
MCV: 91.9 fL (ref 80.0–100.0)
Monocytes Absolute: 0.4 10*3/uL (ref 0.1–1.0)
Monocytes Relative: 12 %
Neutro Abs: 2.2 10*3/uL (ref 1.7–7.7)
Neutrophils Relative %: 60 %
Platelet Count: 207 10*3/uL (ref 150–400)
RBC: 3.34 MIL/uL — ABNORMAL LOW (ref 4.22–5.81)
RDW: 22.7 % — ABNORMAL HIGH (ref 11.5–15.5)
WBC Count: 3.6 10*3/uL — ABNORMAL LOW (ref 4.0–10.5)
nRBC: 0 % (ref 0.0–0.2)

## 2021-03-03 LAB — FERRITIN: Ferritin: 30 ng/mL (ref 24–336)

## 2021-03-03 NOTE — Progress Notes (Signed)
  [facility] OFFICE PROGRESS NOTE   Diagnosis: Iron deficiency anemia  INTERVAL HISTORY:   Benjamin Davila returns as scheduled.  He continues ferrous sulfate.  He reports he is tolerating oral iron well.  No constipation.  He is not aware of any bleeding.  In general he is feeling better.  He reports a colonoscopy is scheduled mid November.  Objective:  Vital signs in last 24 hours:  Blood pressure 135/73, pulse 92, temperature 97.8 F (36.6 C), temperature source Oral, resp. rate 18, height 5\' 10"  (1.778 m), weight 183 lb 3.2 oz (83.1 kg), SpO2 98 %.    Resp: Lungs clear bilaterally. Cardio: Regular rate and rhythm.  2/6 systolic murmur. GI: Abdomen soft and nontender.  No hepatosplenomegaly. Vascular: No leg edema.  Lab Results:  Lab Results  Component Value Date   WBC 3.6 (L) 03/03/2021   HGB 9.3 (L) 03/03/2021   HCT 30.7 (L) 03/03/2021   MCV 91.9 03/03/2021   PLT 207 03/03/2021   NEUTROABS 2.2 03/03/2021    Imaging:  No results found.  Medications: I have reviewed the patient's current medications.  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  He has iron deficiency anemia in the setting of heme positive stool.  Review of the CBC from today shows improvement in the hemoglobin to 9.3.  Ferritin is pending.  He will continue oral iron.  He reports GI evaluation  is scheduled in about a month.  We will contact Dr. Leanna Sato office to see if this can be moved up any sooner.  He will return for lab and follow-up in approximately 1 month.  He will contact the office in the interim with any problems.    Ned Card ANP/GNP-BC   03/03/2021  9:23 AM

## 2021-03-06 ENCOUNTER — Encounter: Payer: Self-pay | Admitting: Nurse Practitioner

## 2021-03-12 DIAGNOSIS — M7061 Trochanteric bursitis, right hip: Secondary | ICD-10-CM | POA: Insufficient documentation

## 2021-03-12 DIAGNOSIS — M7062 Trochanteric bursitis, left hip: Secondary | ICD-10-CM | POA: Insufficient documentation

## 2021-03-12 HISTORY — DX: Trochanteric bursitis, right hip: M70.61

## 2021-03-12 HISTORY — DX: Trochanteric bursitis, left hip: M70.62

## 2021-03-16 ENCOUNTER — Emergency Department (HOSPITAL_BASED_OUTPATIENT_CLINIC_OR_DEPARTMENT_OTHER)
Admission: EM | Admit: 2021-03-16 | Discharge: 2021-03-16 | Disposition: A | Discharge status: Home / Self Care | Payer: Medicare Other | Attending: Emergency Medicine | Admitting: Emergency Medicine

## 2021-03-16 ENCOUNTER — Other Ambulatory Visit: Payer: Self-pay

## 2021-03-16 ENCOUNTER — Encounter (HOSPITAL_BASED_OUTPATIENT_CLINIC_OR_DEPARTMENT_OTHER): Payer: Self-pay | Admitting: Obstetrics and Gynecology

## 2021-03-16 ENCOUNTER — Emergency Department (HOSPITAL_BASED_OUTPATIENT_CLINIC_OR_DEPARTMENT_OTHER): Payer: Medicare Other | Admitting: Radiology

## 2021-03-16 DIAGNOSIS — Z8546 Personal history of malignant neoplasm of prostate: Secondary | ICD-10-CM | POA: Diagnosis not present

## 2021-03-16 DIAGNOSIS — S32050A Wedge compression fracture of fifth lumbar vertebra, initial encounter for closed fracture: Secondary | ICD-10-CM

## 2021-03-16 DIAGNOSIS — Z7984 Long term (current) use of oral hypoglycemic drugs: Secondary | ICD-10-CM | POA: Insufficient documentation

## 2021-03-16 DIAGNOSIS — E119 Type 2 diabetes mellitus without complications: Secondary | ICD-10-CM | POA: Insufficient documentation

## 2021-03-16 DIAGNOSIS — M25551 Pain in right hip: Secondary | ICD-10-CM | POA: Diagnosis present

## 2021-03-16 DIAGNOSIS — Z794 Long term (current) use of insulin: Secondary | ICD-10-CM | POA: Diagnosis not present

## 2021-03-16 DIAGNOSIS — M4856XA Collapsed vertebra, not elsewhere classified, lumbar region, initial encounter for fracture: Secondary | ICD-10-CM | POA: Diagnosis not present

## 2021-03-16 DIAGNOSIS — I10 Essential (primary) hypertension: Secondary | ICD-10-CM | POA: Insufficient documentation

## 2021-03-16 DIAGNOSIS — M5431 Sciatica, right side: Secondary | ICD-10-CM

## 2021-03-16 MED ORDER — PREDNISONE 10 MG PO TABS: ORAL_TABLET | ORAL | 0 refills | Status: AC

## 2021-03-16 MED ORDER — OXYCODONE-ACETAMINOPHEN 5-325 MG PO TABS: 1.0000 | ORAL_TABLET | Freq: Four times a day (QID) | ORAL | 0 refills | Status: DC | PRN

## 2021-03-16 NOTE — ED Provider Notes (Signed)
[facility] EMERGENCY DEPT Provider Note   CSN: [id number] Arrival date & time: 03/16/21  1035     History Chief Complaint  Patient presents with   Leg Pain    Benjamin Davila is a 80 y.o. male.   Leg Pain  Patient presented to the ER for evaluation of leg pain.  Patient states in the last few days he has had sharp pain in his right leg that goes all the way down his hip area to his lower leg.  When he is resting the pain is gone but whenever he tries to walk and put weight on it it is painful and tender.  He had some Vicodin at the house which she tried and it did not help much.  He denies any numbness.  No weakness.  He has not noticed any leg swelling.  No fevers or chills.  Patient saw his orthopedic doctor recently but that was for bursitis in his left hip and not his right.  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   History of pancreatitis 07/12/2020   History of pancytopenia 07/12/2020   Hyperlipidemia, unspecified 07/12/2020   Irregular heart beat 07/12/2020   Long term (current) use of insulin ([place]) 07/12/2020   Malignant neoplasm of prostate ([place]) 06/24/2019   Mood disorder ([place]) 07/12/2020   Prostate cancer ([place])    Systolic murmur 9/79/8921   Type 2 diabetes mellitus with other specified complication ([place]) 1/94/1740    Patient Active Problem List   Diagnosis Date Noted   Aortic regurgitation 07/14/2020   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   Benign prostatic hyperplasia without lower urinary tract symptoms 07/12/2020    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   History of pancreatitis 07/12/2020   History of pancytopenia 07/12/2020   Hyperlipidemia, unspecified 07/12/2020   Irregular heart beat 07/12/2020   Long term (current) use of insulin ([place]) 07/12/2020   Mood disorder ([place]) 81/44/8185   Systolic murmur 63/14/9702   Type 2 diabetes mellitus with other specified complication ([place]) 63/78/5885   Prostate cancer ([place])    Malignant neoplasm of prostate ([place]) 06/24/2019   Aortic stenosis, moderate 03/17/2019   Cardiac murmur 12/05/2018   Essential hypertension 12/05/2018   Diabetes mellitus due to underlying condition with unspecified complications ([place]) 02/77/4128    Past Surgical History:  Procedure Laterality Date   NO PAST SURGERIES         No family history on file.  Social History   Tobacco Use   Smoking status: Never   Smokeless tobacco: Never  Vaping Use   Vaping Use: Never used  Substance Use Topics   Alcohol use: Not Currently   Drug use: Never    Home Medications Prior to Admission medications   Medication Sig Start Date End Date Taking? Authorizing Provider  oxyCODONE-acetaminophen (PERCOCET/ROXICET) 5-325 MG tablet Take 1 tablet by mouth every 6 (six) hours as needed for severe pain. 03/16/21  Yes Dorie Rank, MD  predniSONE (DELTASONE) 10 MG tablet Take 5 tablets (50 mg total) by mouth daily with breakfast for 2 days, THEN 4 tablets (40 mg total) daily  with breakfast for 2 days, THEN 3 tablets (30 mg total) daily with breakfast for 2 days, THEN 2 tablets (20 mg total) daily with breakfast for 2 days, THEN 1 tablet (10 mg total) daily with breakfast for 2 days. 03/16/21 03/26/21 Yes Dorie Rank, MD  atorvastatin (LIPITOR) 20 MG tablet Take 1 tablet by mouth daily. 11/18/18   [provider]  diazepam (VALIUM) 5 MG tablet Take 5 mg by mouth daily as needed. 02/06/21   [provider]  insulin glargine (LANTUS SOLOSTAR) 100 UNIT/ML Solostar Pen Inject 24 Units into the skin  daily.    [provider]  losartan (COZAAR) 100 MG tablet Take 100 mg by mouth daily. 01/29/21   [provider]  losartan (COZAAR) 50 MG tablet Take 50 mg by mouth daily.    [provider]  metFORMIN (GLUCOPHAGE) 500 MG tablet Take 1 tablet by mouth 2 (two) times a day. 11/18/18   [provider]  tamsulosin (FLOMAX) 0.4 MG CAPS capsule Take 0.8 mg by mouth daily. 11/05/20   [provider]  triamcinolone cream (KENALOG) 0.1 % Apply topically 2 (two) times daily. 02/06/21   [provider]    Allergies    Lisinopril  Review of Systems   Review of Systems  All other systems reviewed and are negative.  Physical Exam Updated Vital Signs BP (!) 161/87 (BP Location: Left Arm)   Pulse 79   Temp 98 F (36.7 C)   Resp 16   SpO2 100%   Physical Exam Vitals and nursing note reviewed.  Constitutional:      General: He is not in acute distress.    Appearance: He is well-developed.  HENT:     Head: Normocephalic and atraumatic.     Right Ear: External ear normal.     Left Ear: External ear normal.  Eyes:     General: No scleral icterus.       Right eye: No discharge.        Left eye: No discharge.     Conjunctiva/sclera: Conjunctivae normal.  Neck:     Trachea: No tracheal deviation.  Cardiovascular:     Rate and Rhythm: Normal rate.  Pulmonary:     Effort: Pulmonary effort is normal. No respiratory distress.     Breath sounds: No stridor.  Abdominal:     General: There is no distension.  Musculoskeletal:        General: Tenderness present. No swelling or deformity.     Cervical back: Neck supple.     Comments: No lumbar spine tenderness, no erythema, tenderness palpation right hip region, no calf tenderness or swelling, mild tenderness patient proximal tibia extremities warm and well-perfused, no cyanosis normal pulses  Skin:    General: Skin is warm and dry.     Findings: No rash.  Neurological:     Mental Status: He is  alert.     Cranial Nerves: Cranial nerve deficit: no gross deficits.     Comments: Normal strength and sensation bilateral lower extremities    ED Results / Procedures / Treatments   Labs (all labs ordered are listed, but only abnormal results are displayed) Labs Reviewed - No data to display  EKG None  Radiology DG Lumbar Spine Complete  Result Date: 03/16/2021 CLINICAL DATA:  RIGHT leg pain for 1 week, no known injury EXAM: LUMBAR SPINE - COMPLETE 4+ VIEW COMPARISON:  None; correlation CT abdomen and pelvis 06/15/2019 FINDINGS: Osseous demineralization. Five non-rib-bearing lumbar vertebra.  Superior endplate compression fracture of L5 with 40-50% anterior height loss new since 06/15/2019. Minimal retropulsion of the posterosuperior L5 vertebral body. Scattered endplate spur formation lumbar spine with disc space narrowing at L2-L3. No additional fracture, subluxation, or bone destruction. IMPRESSION: New superior endplate compression fracture of L5 since 06/15/2019 with 40-50% anterior height loss and minimal retropulsion of the posterior upper L5 vertebral body. Osseous demineralization with scattered degenerative disc disease changes. Electronically Signed   By: Lavonia Dana M.D.   On: 03/16/2021 13:21   DG Tibia/Fibula Right  Result Date: 03/16/2021 CLINICAL DATA:  RIGHT leg pain for 1 week, no known injury EXAM: RIGHT TIBIA AND FIBULA - 2 VIEW COMPARISON:  None FINDINGS: Osseous demineralization. Knee and ankle joint alignment normal. No acute fracture, dislocation, or bone destruction. Scattered atherosclerotic calcifications. IMPRESSION: No acute osseous findings. Electronically Signed   By: Lavonia Dana M.D.   On: 03/16/2021 13:15   DG Hip Unilat W or Wo Pelvis 2-3 Views Right  Result Date: 03/16/2021 CLINICAL DATA:  Right leg pain for 1 week, known known injury EXAM: DG HIP (WITH OR WITHOUT PELVIS) 2-3V RIGHT COMPARISON:  None FINDINGS: Osseous demineralization. Hip and SI joint  spaces preserved. No acute fracture, dislocation, or bone destruction. Scattered atherosclerotic calcifications and pelvic phleboliths. IMPRESSION: No significant osseous abnormalities. Electronically Signed   By: Lavonia Dana M.D.   On: 03/16/2021 13:16    Procedures Procedures   Medications Ordered in ED Medications - No data to display  ED Course  I have reviewed the triage vital signs and the nursing notes.  Pertinent labs & imaging results that were available during my care of the patient were reviewed by me and considered in my medical decision making (see chart for details).    MDM Rules/Calculators/A&P                           Patient presented to the ED for evaluation of leg pain.  No recent trauma.  No signs of any acute vascular compromise.  No findings to suggest infection or DVT.  Patient's pain suggests a radiculopathy.  X-rays do show an L5 compression fracture.  Patient states he has seen an orthopedic spine doctor in the past, Dr. Rolena Infante and was diagnosed with spinal stenosis.  He is not sure if he had a compression fracture diagnosed previously.  The x-ray today however does show that it is new since January 2021.  Patient is able to walk here without difficulty.  No signs of any acute neurologic dysfunction requiring any emergent intervention.  Will prescribe medications for pain.  Recommend outpatient follow-up with orthopedic spine. Final Clinical Impression(s) / ED Diagnoses Final diagnoses:  Sciatica of right side  Closed compression fracture of L5 vertebra, initial encounter ([place])    Rx / DC Orders ED Discharge Orders          Ordered    oxyCODONE-acetaminophen (PERCOCET/ROXICET) 5-325 MG tablet  Every 6 hours PRN        03/16/21 1404    predniSONE (DELTASONE) 10 MG tablet        03/16/21 1404             Dorie Rank, MD 03/16/21 1409

## 2021-03-16 NOTE — Discharge Instructions (Addendum)
Take the medications as prescribed.  Follow-up with your orthopedic spine doctor, Dr. Rolena Infante for further treatment of the compression fracture noted on x-ray as well as the concerns for sciatica

## 2021-03-16 NOTE — ED Triage Notes (Signed)
Patient reports to the ER for leg pain. Patient reports a few months ago he put his leg through a dry wall but a few days ago he felt a sharp pain from his right hip all the way down to his ankle. Patient states he has felt like he cannot put his foot down. Patient reports the only relief is if he sits. Patient states his shin and hip and the back of his calf hurt. Patient has done hydrocodone and muscle relaxer's for pain without relief

## 2021-03-24 ENCOUNTER — Ambulatory Visit: Payer: Medicare Other | Admitting: Cardiology

## 2021-03-27 ENCOUNTER — Inpatient Hospital Stay: Payer: Medicare Other | Attending: Oncology

## 2021-03-27 ENCOUNTER — Other Ambulatory Visit: Payer: Self-pay

## 2021-03-27 ENCOUNTER — Inpatient Hospital Stay (HOSPITAL_BASED_OUTPATIENT_CLINIC_OR_DEPARTMENT_OTHER): Payer: Medicare Other | Admitting: Oncology

## 2021-03-27 VITALS — BP 162/77 | HR 88 | Temp 97.8°F | Resp 20 | Ht 70.0 in | Wt 182.0 lb

## 2021-03-27 DIAGNOSIS — R195 Other fecal abnormalities: Secondary | ICD-10-CM | POA: Insufficient documentation

## 2021-03-27 DIAGNOSIS — M4800 Spinal stenosis, site unspecified: Secondary | ICD-10-CM | POA: Diagnosis not present

## 2021-03-27 DIAGNOSIS — Z923 Personal history of irradiation: Secondary | ICD-10-CM | POA: Insufficient documentation

## 2021-03-27 DIAGNOSIS — C61 Malignant neoplasm of prostate: Secondary | ICD-10-CM | POA: Diagnosis present

## 2021-03-27 DIAGNOSIS — E119 Type 2 diabetes mellitus without complications: Secondary | ICD-10-CM | POA: Insufficient documentation

## 2021-03-27 DIAGNOSIS — I10 Essential (primary) hypertension: Secondary | ICD-10-CM | POA: Insufficient documentation

## 2021-03-27 DIAGNOSIS — D509 Iron deficiency anemia, unspecified: Secondary | ICD-10-CM | POA: Insufficient documentation

## 2021-03-27 DIAGNOSIS — D649 Anemia, unspecified: Secondary | ICD-10-CM | POA: Diagnosis not present

## 2021-03-27 DIAGNOSIS — R0609 Other forms of dyspnea: Secondary | ICD-10-CM | POA: Diagnosis not present

## 2021-03-27 DIAGNOSIS — R5383 Other fatigue: Secondary | ICD-10-CM | POA: Diagnosis not present

## 2021-03-27 DIAGNOSIS — I350 Nonrheumatic aortic (valve) stenosis: Secondary | ICD-10-CM | POA: Insufficient documentation

## 2021-03-27 DIAGNOSIS — N400 Enlarged prostate without lower urinary tract symptoms: Secondary | ICD-10-CM | POA: Insufficient documentation

## 2021-03-27 DIAGNOSIS — Z8719 Personal history of other diseases of the digestive system: Secondary | ICD-10-CM | POA: Insufficient documentation

## 2021-03-27 DIAGNOSIS — K754 Autoimmune hepatitis: Secondary | ICD-10-CM | POA: Insufficient documentation

## 2021-03-27 LAB — CBC WITH DIFFERENTIAL (CANCER CENTER ONLY)
Abs Immature Granulocytes: 0.02 10*3/uL (ref 0.00–0.07)
Basophils Absolute: 0 10*3/uL (ref 0.0–0.1)
Basophils Relative: 1 %
Eosinophils Absolute: 0.2 10*3/uL (ref 0.0–0.5)
Eosinophils Relative: 5 %
HCT: 32.1 % — ABNORMAL LOW (ref 39.0–52.0)
Hemoglobin: 10.1 g/dL — ABNORMAL LOW (ref 13.0–17.0)
Immature Granulocytes: 1 %
Lymphocytes Relative: 20 %
Lymphs Abs: 0.6 10*3/uL — ABNORMAL LOW (ref 0.7–4.0)
MCH: 28.3 pg (ref 26.0–34.0)
MCHC: 31.5 g/dL (ref 30.0–36.0)
MCV: 89.9 fL (ref 80.0–100.0)
Monocytes Absolute: 0.3 10*3/uL (ref 0.1–1.0)
Monocytes Relative: 11 %
Neutro Abs: 1.9 10*3/uL (ref 1.7–7.7)
Neutrophils Relative %: 62 %
Platelet Count: 201 10*3/uL (ref 150–400)
RBC: 3.57 MIL/uL — ABNORMAL LOW (ref 4.22–5.81)
RDW: 18.4 % — ABNORMAL HIGH (ref 11.5–15.5)
WBC Count: 3.1 10*3/uL — ABNORMAL LOW (ref 4.0–10.5)
nRBC: 0 % (ref 0.0–0.2)

## 2021-03-27 LAB — FERRITIN: Ferritin: 24 ng/mL (ref 24–336)

## 2021-03-27 NOTE — Progress Notes (Signed)
  [facility] OFFICE PROGRESS NOTE   Diagnosis: Iron deficiency anemia  INTERVAL HISTORY:   Mr. Pentecost returns as scheduled.  He continues iron.  No bleeding.  He reports pain at the right back and leg secondary to "sciatica ".  He has a history of spinal stenosis.  He is followed by orthopedics.  He reports an MRI is scheduled.  He underwent upper and lower endoscopy procedures by Dr. Alessandra Bevels on 03/13/2021.  Multiple were removed from the colon.  We do not have the endoscopy report available today.   He is scheduled for follow-up with Dr. Alessandra Bevels in January.  He reports improvement in fatigue since starting iron therapy.  Objective:  Vital signs in last 24 hours:  Blood pressure (!) 162/77, pulse 88, temperature 97.8 F (36.6 C), temperature source Oral, resp. rate 20, height 5\' 10"  (1.778 m), weight 182 lb (82.6 kg), SpO2 100 %.   Resp: Lungs clear bilaterally Cardio: Regular rate and rhythm, 2/6 murmur GI: Nontender, no hepatosplenomegaly Vascular: No leg edema  Lab Results:  Lab Results  Component Value Date   WBC 3.1 (L) 03/27/2021   HGB 10.1 (L) 03/27/2021   HCT 32.1 (L) 03/27/2021   MCV 89.9 03/27/2021   PLT 201 03/27/2021   NEUTROABS 1.9 03/27/2021     Medications: I have reviewed the patient's current medications.   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 Patchin appears stable.  The hemoglobin has improved while on iron therapy for the past month.  He will continue ferrous sulfate.  He has iron deficiency anemia in the setting of a Hemoccult positive stool.  Upper and lower endoscopy evaluation did not reveal a clear source of blood loss per his report.  We will follow-up on the EGD procedure note.  It is possible the anemia is in part related to treatment for prostate cancer and may not completely correct with iron therapy.  We will follow-up on the ferritin level from today.  I recommend he follow-up with Dr. Alessandra Bevels to continue looking for a source of blood loss.  He will return for a lab visit in approximately 1 month.  He will be seen for an office visit in 2 months.  Mr. Leite will continue follow-up with orthopedics to evaluate the back and leg pain.  Betsy Coder, MD  03/27/2021  12:16 PM

## 2021-03-28 ENCOUNTER — Ambulatory Visit: Payer: Medicare Other | Admitting: Oncology

## 2021-03-28 ENCOUNTER — Telehealth: Payer: Self-pay | Admitting: *Deleted

## 2021-03-28 ENCOUNTER — Other Ambulatory Visit: Payer: Medicare Other

## 2021-03-28 NOTE — Telephone Encounter (Signed)
Notified patient that ferritin is 24 (low normal) and MD wants him to continue ferrous sulfate tid and f/u for lab on 12/5 and it will be checked again. He agrees to plan.

## 2021-03-28 NOTE — Telephone Encounter (Signed)
-----   Message from Ladell Pier, MD sent at 03/27/2021  8:19 PM EST ----- Please call patient iron level is still at low end of normal range, continue ferrous sulfate 3 times daily, repeat ferritin level next lab

## 2021-03-30 ENCOUNTER — Encounter: Payer: Self-pay | Admitting: Nurse Practitioner

## 2021-04-03 ENCOUNTER — Other Ambulatory Visit: Payer: Self-pay

## 2021-04-04 ENCOUNTER — Ambulatory Visit: Payer: Medicare Other | Admitting: Cardiology

## 2021-04-11 ENCOUNTER — Ambulatory Visit: Payer: Medicare Other | Admitting: Oncology

## 2021-04-11 ENCOUNTER — Other Ambulatory Visit: Payer: Medicare Other

## 2021-04-20 ENCOUNTER — Other Ambulatory Visit: Payer: Self-pay

## 2021-04-20 ENCOUNTER — Encounter: Payer: Self-pay | Admitting: Plastic Surgery

## 2021-04-20 ENCOUNTER — Ambulatory Visit (INDEPENDENT_AMBULATORY_CARE_PROVIDER_SITE_OTHER): Payer: Medicare Other | Admitting: Plastic Surgery

## 2021-04-20 VITALS — BP 144/82 | HR 70 | Ht 70.0 in | Wt 182.0 lb

## 2021-04-20 DIAGNOSIS — M1812 Unilateral primary osteoarthritis of first carpometacarpal joint, left hand: Secondary | ICD-10-CM

## 2021-04-20 NOTE — Progress Notes (Signed)
Referring Provider Kristen Loader, [place],  [place] [address]   CC:  Chief Complaint  Patient presents with   Advice Only      Benjamin Davila is an 80 y.o. male.  HPI: Patient presents to discuss left-sided thumb pain.  A few months ago he sat on his left thumb accidentally while getting down into a chair and has noticed pain since then.  He was seen in urgent care and x-ray was performed which shows no fracture.  He has had persistent pain at the base of his thumb and is here to be evaluated.  He also has some other associated orthopedic issues with his back and hip that he is pursuing treatment for.  Allergies  Allergen Reactions   Lisinopril Cough    Other reaction(s): cough    Outpatient Encounter Medications as of 04/20/2021  Medication Sig   atorvastatin (LIPITOR) 20 MG tablet Take 1 tablet by mouth daily.   diazepam (VALIUM) 5 MG tablet Take 5 mg by mouth daily as needed for anxiety.   ferrous sulfate 325 (65 FE) MG tablet Take 325 mg by mouth in the morning, at noon, and at bedtime.   insulin glargine (LANTUS SOLOSTAR) 100 UNIT/ML Solostar Pen Inject 24 Units into the skin daily.   losartan (COZAAR) 100 MG tablet Take 100 mg by mouth daily.   losartan (COZAAR) 50 MG tablet Take 50 mg by mouth daily.   metFORMIN (GLUCOPHAGE) 500 MG tablet Take 1 tablet by mouth 2 (two) times a day.   pantoprazole (PROTONIX) 40 MG tablet Take 40 mg by mouth daily.   tamsulosin (FLOMAX) 0.4 MG CAPS capsule Take 0.8 mg by mouth daily.   triamcinolone cream (KENALOG) 0.1 % Apply 1 application topically 2 (two) times daily.   No facility-administered encounter medications on file as of 04/20/2021.     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   History of pancreatitis 07/12/2020   History of pancytopenia 07/12/2020   Hyperlipidemia, unspecified 07/12/2020   Irregular heart beat 07/12/2020   Long term (current) use of insulin ([place]) 07/12/2020   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   Lumbar radiculopathy 06/17/2020   Lumbar spondylosis 07/28/2020   Malignant neoplasm of prostate ([place]) 06/24/2019   Mood disorder ([place]) 07/12/2020   Pain of left hip joint 08/25/2019   Prostate cancer ([place])    Spinal stenosis of lumbar region 1/54/0086   Systolic murmur 7/61/9509   Trochanteric bursitis of left hip 03/12/2021   Trochanteric bursitis of right hip 03/12/2021   Type 2 diabetes mellitus with other specified complication ([place]) 08/14/7122    Past Surgical History:  Procedure Laterality Date   NO PAST SURGERIES      No family history on file.  Social History   Social History Narrative   Not on file     Review of Systems General: Denies fevers, chills, weight loss CV: Denies chest pain, shortness of breath, palpitations  Physical Exam Vitals with BMI 04/20/2021 03/27/2021 03/16/2021  Height 5\' 10"  5\' 10"  -  Weight 182 lbs 182 lbs -  BMI 58.09 98.33 -  Systolic 825 053 976  Diastolic 82 77  87  Pulse 70 88 79    General:  No acute distress,  Alert and oriented, Non-Toxic, Normal speech and affect Left hand: Fingers well-perfused normal cap refill palp radial pulse.  Sensation is intact throughout.  He has good range of motion.  Tenderness is focused over the thumb CMC joint.  Nontender over the radial styloid.  Thumb MP joint is stable and nontender.  Shuck test provides some pain to the thumb and palpation over the base the metacarpal carpal shifting it ulnarly and dorsally seems to be the most provocative maneuver that causes him discomfort.  X-ray was reviewed and shows some signs  of early thumb CMC arthritis with no fracture.  Assessment/Plan Patient presents with what I suspect is an exacerbation of left thumb CMC arthritis.  We discussed various treatment regimens that could include splinting and anti-inflammatories and also discussed a steroid injection.  He like the idea of a steroid injection.  We discussed the risks and benefits and he is interested in moving forward.  The thumb was prepped with an alcohol pad and 1 cc of lidocaine was injected into the thumb CMC joint followed by 1 cc of Kenalog 40.  He tolerated this fine.  We will see him again on an as-needed basis or sooner if he develops any worsening of his symptoms.  All of his questions were answered.  [name] 04/20/2021, 10:51 AM

## 2021-04-24 ENCOUNTER — Other Ambulatory Visit: Payer: Self-pay

## 2021-04-24 ENCOUNTER — Inpatient Hospital Stay: Payer: Medicare Other | Attending: Oncology

## 2021-04-24 DIAGNOSIS — C61 Malignant neoplasm of prostate: Secondary | ICD-10-CM | POA: Diagnosis not present

## 2021-04-24 DIAGNOSIS — D649 Anemia, unspecified: Secondary | ICD-10-CM

## 2021-04-24 DIAGNOSIS — D509 Iron deficiency anemia, unspecified: Secondary | ICD-10-CM | POA: Diagnosis present

## 2021-04-24 DIAGNOSIS — Z79899 Other long term (current) drug therapy: Secondary | ICD-10-CM | POA: Insufficient documentation

## 2021-04-24 LAB — CBC WITH DIFFERENTIAL (CANCER CENTER ONLY)
Abs Immature Granulocytes: 0.01 10*3/uL (ref 0.00–0.07)
Basophils Absolute: 0 10*3/uL (ref 0.0–0.1)
Basophils Relative: 0 %
Eosinophils Absolute: 0.4 10*3/uL (ref 0.0–0.5)
Eosinophils Relative: 8 %
HCT: 38.5 % — ABNORMAL LOW (ref 39.0–52.0)
Hemoglobin: 12.3 g/dL — ABNORMAL LOW (ref 13.0–17.0)
Immature Granulocytes: 0 %
Lymphocytes Relative: 20 %
Lymphs Abs: 0.9 10*3/uL (ref 0.7–4.0)
MCH: 28.8 pg (ref 26.0–34.0)
MCHC: 31.9 g/dL (ref 30.0–36.0)
MCV: 90.2 fL (ref 80.0–100.0)
Monocytes Absolute: 0.4 10*3/uL (ref 0.1–1.0)
Monocytes Relative: 9 %
Neutro Abs: 3 10*3/uL (ref 1.7–7.7)
Neutrophils Relative %: 63 %
Platelet Count: 229 10*3/uL (ref 150–400)
RBC: 4.27 MIL/uL (ref 4.22–5.81)
RDW: 15.9 % — ABNORMAL HIGH (ref 11.5–15.5)
WBC Count: 4.7 10*3/uL (ref 4.0–10.5)
nRBC: 0 % (ref 0.0–0.2)

## 2021-04-24 LAB — FERRITIN: Ferritin: 35 ng/mL (ref 24–336)

## 2021-06-05 ENCOUNTER — Other Ambulatory Visit: Payer: Self-pay

## 2021-06-05 ENCOUNTER — Inpatient Hospital Stay: Payer: Medicare Other

## 2021-06-05 ENCOUNTER — Encounter: Payer: Self-pay | Admitting: Nurse Practitioner

## 2021-06-05 ENCOUNTER — Inpatient Hospital Stay: Payer: Medicare Other | Attending: Oncology | Admitting: Nurse Practitioner

## 2021-06-05 VITALS — BP 133/73 | HR 91 | Temp 97.9°F | Resp 20 | Ht 70.0 in | Wt 182.0 lb

## 2021-06-05 DIAGNOSIS — R0609 Other forms of dyspnea: Secondary | ICD-10-CM | POA: Insufficient documentation

## 2021-06-05 DIAGNOSIS — Z8719 Personal history of other diseases of the digestive system: Secondary | ICD-10-CM | POA: Diagnosis not present

## 2021-06-05 DIAGNOSIS — D122 Benign neoplasm of ascending colon: Secondary | ICD-10-CM | POA: Insufficient documentation

## 2021-06-05 DIAGNOSIS — I10 Essential (primary) hypertension: Secondary | ICD-10-CM | POA: Diagnosis not present

## 2021-06-05 DIAGNOSIS — Z923 Personal history of irradiation: Secondary | ICD-10-CM | POA: Diagnosis not present

## 2021-06-05 DIAGNOSIS — D123 Benign neoplasm of transverse colon: Secondary | ICD-10-CM | POA: Diagnosis not present

## 2021-06-05 DIAGNOSIS — R5383 Other fatigue: Secondary | ICD-10-CM | POA: Diagnosis not present

## 2021-06-05 DIAGNOSIS — Z79899 Other long term (current) drug therapy: Secondary | ICD-10-CM | POA: Diagnosis not present

## 2021-06-05 DIAGNOSIS — D649 Anemia, unspecified: Secondary | ICD-10-CM | POA: Diagnosis not present

## 2021-06-05 DIAGNOSIS — E119 Type 2 diabetes mellitus without complications: Secondary | ICD-10-CM | POA: Diagnosis not present

## 2021-06-05 DIAGNOSIS — C61 Malignant neoplasm of prostate: Secondary | ICD-10-CM | POA: Diagnosis present

## 2021-06-05 DIAGNOSIS — N400 Enlarged prostate without lower urinary tract symptoms: Secondary | ICD-10-CM | POA: Diagnosis not present

## 2021-06-05 DIAGNOSIS — I350 Nonrheumatic aortic (valve) stenosis: Secondary | ICD-10-CM | POA: Diagnosis not present

## 2021-06-05 DIAGNOSIS — D509 Iron deficiency anemia, unspecified: Secondary | ICD-10-CM | POA: Insufficient documentation

## 2021-06-05 LAB — CBC WITH DIFFERENTIAL (CANCER CENTER ONLY)
Abs Immature Granulocytes: 0 10*3/uL (ref 0.00–0.07)
Basophils Absolute: 0 10*3/uL (ref 0.0–0.1)
Basophils Relative: 0 %
Eosinophils Absolute: 0.2 10*3/uL (ref 0.0–0.5)
Eosinophils Relative: 4 %
HCT: 35 % — ABNORMAL LOW (ref 39.0–52.0)
Hemoglobin: 11.2 g/dL — ABNORMAL LOW (ref 13.0–17.0)
Immature Granulocytes: 0 %
Lymphocytes Relative: 15 %
Lymphs Abs: 0.7 10*3/uL (ref 0.7–4.0)
MCH: 29.9 pg (ref 26.0–34.0)
MCHC: 32 g/dL (ref 30.0–36.0)
MCV: 93.3 fL (ref 80.0–100.0)
Monocytes Absolute: 0.2 10*3/uL (ref 0.1–1.0)
Monocytes Relative: 4 %
Neutro Abs: 3.5 10*3/uL (ref 1.7–7.7)
Neutrophils Relative %: 77 %
Platelet Count: 197 10*3/uL (ref 150–400)
RBC: 3.75 MIL/uL — ABNORMAL LOW (ref 4.22–5.81)
RDW: 13.4 % (ref 11.5–15.5)
WBC Count: 4.6 10*3/uL (ref 4.0–10.5)
nRBC: 0 % (ref 0.0–0.2)

## 2021-06-05 NOTE — Progress Notes (Signed)
°  [facility] OFFICE PROGRESS NOTE   Diagnosis: Iron deficiency anemia  INTERVAL HISTORY:   Benjamin Davila returns as scheduled.  He continues oral iron.  In general he feels well.  No bleeding.  He denies fatigue.  No shortness of breath.  He reports completing another "stool sample", negative for blood.  Objective:  Vital signs in last 24 hours:  Blood pressure 133/73, pulse 91, temperature 97.9 F (36.6 C), temperature source Oral, resp. rate 20, height 5\' 10"  (1.778 m), weight 182 lb (82.6 kg), SpO2 98 %.    Resp: Lungs clear bilaterally. Cardio: Regular rate and rhythm; 2/6 systolic murmur. GI: Abdomen soft and nontender.  No hepatosplenomegaly. Vascular: No leg edema.   Lab Results:  Lab Results  Component Value Date   WBC 4.6 06/05/2021   HGB 11.2 (L) 06/05/2021   HCT 35.0 (L) 06/05/2021   MCV 93.3 06/05/2021   PLT 197 06/05/2021   NEUTROABS 3.5 06/05/2021    Imaging:  No results found.  Medications: I have reviewed the patient's current medications.  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 pylori organisms; ascending colon, cecum, polyp-tubular adenomas; hepatic flexure polyp, tubular adenoma. 04/24/2021 hemoglobin 12.3, ferritin 35 06/05/2021 hemoglobin 11.2 Fatigue/dyspnea on exertion secondary to #1 Prostate cancer, stage T2b adenocarcinoma the  prostate with Gleason score of 4+5 and PSA of 8.23; status post definitive radiotherapy 09/24/2019 through 11/19/2019  Diabetes Hypertension History of pancreatitis 2007 BPH History of autoimmune hepatitis Aortic stenosis  Disposition: Benjamin Davila appears stable.  Hemoglobin continues to be improved since beginning oral iron.  He will continue the same.  He reports recent stool cards returned negative for blood and additional evaluation for a source of GI blood loss is not planned.  He will return for CBC and ferritin level in 4 weeks.  We will see him in follow-up in 8 weeks.  He will contact the office in the interim with any problems.  Plan reviewed with Dr. Benay Spice.    Ned Card ANP/GNP-BC   06/05/2021  10:41 AM

## 2021-07-03 ENCOUNTER — Other Ambulatory Visit: Payer: Self-pay

## 2021-07-03 ENCOUNTER — Inpatient Hospital Stay: Payer: Medicare Other | Attending: Oncology

## 2021-07-03 DIAGNOSIS — C61 Malignant neoplasm of prostate: Secondary | ICD-10-CM | POA: Insufficient documentation

## 2021-07-03 DIAGNOSIS — D649 Anemia, unspecified: Secondary | ICD-10-CM | POA: Diagnosis present

## 2021-07-03 LAB — CBC WITH DIFFERENTIAL (CANCER CENTER ONLY)
Abs Immature Granulocytes: 0.02 10*3/uL (ref 0.00–0.07)
Basophils Absolute: 0 10*3/uL (ref 0.0–0.1)
Basophils Relative: 1 %
Eosinophils Absolute: 0.2 10*3/uL (ref 0.0–0.5)
Eosinophils Relative: 6 %
HCT: 35.2 % — ABNORMAL LOW (ref 39.0–52.0)
Hemoglobin: 11.3 g/dL — ABNORMAL LOW (ref 13.0–17.0)
Immature Granulocytes: 1 %
Lymphocytes Relative: 21 %
Lymphs Abs: 0.8 10*3/uL (ref 0.7–4.0)
MCH: 29.8 pg (ref 26.0–34.0)
MCHC: 32.1 g/dL (ref 30.0–36.0)
MCV: 92.9 fL (ref 80.0–100.0)
Monocytes Absolute: 0.4 10*3/uL (ref 0.1–1.0)
Monocytes Relative: 10 %
Neutro Abs: 2.2 10*3/uL (ref 1.7–7.7)
Neutrophils Relative %: 61 %
Platelet Count: 205 10*3/uL (ref 150–400)
RBC: 3.79 MIL/uL — ABNORMAL LOW (ref 4.22–5.81)
RDW: 12.7 % (ref 11.5–15.5)
WBC Count: 3.6 10*3/uL — ABNORMAL LOW (ref 4.0–10.5)
nRBC: 0 % (ref 0.0–0.2)

## 2021-07-03 LAB — FERRITIN: Ferritin: 27 ng/mL (ref 24–336)

## 2021-07-06 ENCOUNTER — Other Ambulatory Visit: Payer: Self-pay | Admitting: Neurological Surgery

## 2021-07-31 ENCOUNTER — Other Ambulatory Visit: Payer: Self-pay

## 2021-07-31 ENCOUNTER — Inpatient Hospital Stay: Payer: Medicare Other

## 2021-07-31 ENCOUNTER — Inpatient Hospital Stay: Payer: Medicare Other | Attending: Oncology | Admitting: Oncology

## 2021-07-31 VITALS — BP 169/94 | HR 89 | Temp 98.7°F | Resp 19 | Ht 70.0 in | Wt 182.8 lb

## 2021-07-31 DIAGNOSIS — D649 Anemia, unspecified: Secondary | ICD-10-CM | POA: Diagnosis not present

## 2021-07-31 DIAGNOSIS — I350 Nonrheumatic aortic (valve) stenosis: Secondary | ICD-10-CM | POA: Insufficient documentation

## 2021-07-31 DIAGNOSIS — R5383 Other fatigue: Secondary | ICD-10-CM | POA: Insufficient documentation

## 2021-07-31 DIAGNOSIS — N400 Enlarged prostate without lower urinary tract symptoms: Secondary | ICD-10-CM | POA: Insufficient documentation

## 2021-07-31 DIAGNOSIS — R0609 Other forms of dyspnea: Secondary | ICD-10-CM | POA: Insufficient documentation

## 2021-07-31 DIAGNOSIS — D123 Benign neoplasm of transverse colon: Secondary | ICD-10-CM | POA: Diagnosis not present

## 2021-07-31 DIAGNOSIS — R197 Diarrhea, unspecified: Secondary | ICD-10-CM | POA: Diagnosis not present

## 2021-07-31 DIAGNOSIS — Z923 Personal history of irradiation: Secondary | ICD-10-CM | POA: Diagnosis not present

## 2021-07-31 DIAGNOSIS — Z79899 Other long term (current) drug therapy: Secondary | ICD-10-CM | POA: Insufficient documentation

## 2021-07-31 DIAGNOSIS — D122 Benign neoplasm of ascending colon: Secondary | ICD-10-CM | POA: Insufficient documentation

## 2021-07-31 DIAGNOSIS — R159 Full incontinence of feces: Secondary | ICD-10-CM | POA: Diagnosis not present

## 2021-07-31 DIAGNOSIS — C61 Malignant neoplasm of prostate: Secondary | ICD-10-CM | POA: Diagnosis present

## 2021-07-31 DIAGNOSIS — K754 Autoimmune hepatitis: Secondary | ICD-10-CM | POA: Diagnosis not present

## 2021-07-31 DIAGNOSIS — Z8719 Personal history of other diseases of the digestive system: Secondary | ICD-10-CM | POA: Insufficient documentation

## 2021-07-31 DIAGNOSIS — K2100 Gastro-esophageal reflux disease with esophagitis, without bleeding: Secondary | ICD-10-CM | POA: Insufficient documentation

## 2021-07-31 DIAGNOSIS — I10 Essential (primary) hypertension: Secondary | ICD-10-CM | POA: Insufficient documentation

## 2021-07-31 DIAGNOSIS — E119 Type 2 diabetes mellitus without complications: Secondary | ICD-10-CM | POA: Diagnosis not present

## 2021-07-31 DIAGNOSIS — D509 Iron deficiency anemia, unspecified: Secondary | ICD-10-CM | POA: Insufficient documentation

## 2021-07-31 LAB — CBC WITH DIFFERENTIAL (CANCER CENTER ONLY)
Abs Immature Granulocytes: 0.01 10*3/uL (ref 0.00–0.07)
Basophils Absolute: 0 10*3/uL (ref 0.0–0.1)
Basophils Relative: 1 %
Eosinophils Absolute: 0.2 10*3/uL (ref 0.0–0.5)
Eosinophils Relative: 5 %
HCT: 35.5 % — ABNORMAL LOW (ref 39.0–52.0)
Hemoglobin: 11.7 g/dL — ABNORMAL LOW (ref 13.0–17.0)
Immature Granulocytes: 0 %
Lymphocytes Relative: 23 %
Lymphs Abs: 0.9 10*3/uL (ref 0.7–4.0)
MCH: 30.5 pg (ref 26.0–34.0)
MCHC: 33 g/dL (ref 30.0–36.0)
MCV: 92.4 fL (ref 80.0–100.0)
Monocytes Absolute: 0.3 10*3/uL (ref 0.1–1.0)
Monocytes Relative: 9 %
Neutro Abs: 2.3 10*3/uL (ref 1.7–7.7)
Neutrophils Relative %: 62 %
Platelet Count: 180 10*3/uL (ref 150–400)
RBC: 3.84 MIL/uL — ABNORMAL LOW (ref 4.22–5.81)
RDW: 13 % (ref 11.5–15.5)
WBC Count: 3.7 10*3/uL — ABNORMAL LOW (ref 4.0–10.5)
nRBC: 0 % (ref 0.0–0.2)

## 2021-07-31 LAB — FERRITIN: Ferritin: 28 ng/mL (ref 24–336)

## 2021-07-31 NOTE — Progress Notes (Signed)
?  [facility] ?OFFICE PROGRESS NOTE ? ? ?Diagnosis: Iron deficiency anemia ? ?INTERVAL HISTORY:  ? ?Mr. Benjamin Davila returns as scheduled.  He takes iron 3 times per day.  No bleeding.  He reports intermittent fecal incontinence and diarrhea since completing prostate radiation.  Good appetite and energy level. ?He reports his testosterone level is low.  He plans to see urology to discuss the indication for testosterone replacement. ?Objective: ? ?Vital signs in last 24 hours: ? ?Blood pressure (!) 169/94, pulse 89, temperature 98.7 ?F (37.1 ?C), temperature source Oral, resp. rate 19, height '5\' 10"'$  (1.778 m), weight 182 lb 12.8 oz (82.9 kg), SpO2 98 %. ?  ? ?Lymphatics: No cervical, supraclavicular, axillary, or inguinal nodes ?Resp: Lungs clear bilaterally ?Cardio: Regular rate and rhythm, 2/6 systolic murmur ?GI: No hepatosplenomegaly ?Vascular: No leg edema ? ? ?Lab Results: ? ?Lab Results  ?Component Value Date  ? WBC 3.7 (L) 07/31/2021  ? HGB 11.7 (L) 07/31/2021  ? HCT 35.5 (L) 07/31/2021  ? MCV 92.4 07/31/2021  ? PLT 180 07/31/2021  ? NEUTROABS 2.3 07/31/2021  ? ? ? ?Medications: I have reviewed the patient's current medications. ? ? ?Assessment/Plan: ? 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 pylori organisms; ascending colon, cecum, polyp-tubular adenomas; hepatic flexure polyp, tubular adenoma. 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 ? ?Disposition: ?Mr. Benjamin Davila has a history of iron deficiency anemia.  The hemoglobin has partially corrected with oral iron replacement.  He has persistent mild anemia and leukopenia, potentially related to treatment for prostate cancer. ?I encouraged him to discuss the low testosterone level with his urologist prior to considering testosterone replacement therapy. ? ?Mr. Benjamin Davila will return for an office visit and CBC in 4-5 months. ? ?Betsy Coder, MD ? ?07/31/2021  ?11:35 AM ? ? ?

## 2021-08-01 NOTE — Progress Notes (Signed)
Surgical Instructions ? ? ? Your procedure is scheduled on 08/09/21. ? Report to [facility] Main Entrance "A" at 5:30 A.M., then check in with the Admitting office. ? Call this number if you have problems the morning of surgery: ? [phone] ? ? If you have any questions prior to your surgery date call [phone]: Open Monday-Friday 8am-4pm ? ? ? Remember: ? Do not eat or drink after midnight the night before your surgery ? ? ?  ? Take these medicines the morning of surgery with A SIP OF WATER:  ?atorvastatin (LIPITOR) ?pantoprazole (PROTONIX) ?tamsulosin [facility]) ? ?AS NEEDED: ?diazepam (VALIUM) ? ? ?As of today, STOP taking any Aspirin (unless otherwise instructed by your surgeon) Aleve, Naproxen, Ibuprofen, Motrin, Advil, Goody's, BC's, all herbal medications, fish oil, and all vitamins. ? ?WHAT DO I DO ABOUT MY DIABETES MEDICATION? ? ? ?Do not take oral diabetes medicines (pills) the morning of surgery.  DO NOT take metFORMIN (GLUCOPHAGE) day of surgery.  ? ? ?THE MORNING OF SURGERY, take __11 units (50 %)_of ____insulin glargine (LANTUS SOLOSTAR). ? ?The day of surgery, do not take other diabetes injectables, including Byetta (exenatide), Bydureon (exenatide ER), Victoza (liraglutide), or Trulicity (dulaglutide). ? ?If your CBG is greater than 220 mg/dL, you may take ? of your sliding scale (correction) dose of insulin. ? ? ?HOW TO MANAGE YOUR DIABETES ?BEFORE AND AFTER SURGERY ? ?Why is it important to control my blood sugar before and after surgery? ?Improving blood sugar levels before and after surgery helps healing and can limit problems. ?A way of improving blood sugar control is eating a healthy diet by: ? Eating less sugar and carbohydrates ? Increasing activity/exercise ? Talking with your doctor about reaching your blood sugar goals ?High blood sugars (greater than 180 mg/dL) can raise your risk of infections and slow your recovery, so you will need to focus on controlling your diabetes during the  weeks before surgery. ?Make sure that the doctor who takes care of your diabetes knows about your planned surgery including the date and location. ? ?How do I manage my blood sugar before surgery? ?Check your blood sugar at least 4 times a day, starting 2 days before surgery, to make sure that the level is not too high or low. ? ?Check your blood sugar the morning of your surgery when you wake up and every 2 hours until you get to the Short Stay unit. ? ?If your blood sugar is less than 70 mg/dL, you will need to treat for low blood sugar: ?Do not take insulin. ?Treat a low blood sugar (less than 70 mg/dL) with ? cup of clear juice (cranberry or apple), 4 glucose tablets, OR glucose gel. ?Recheck blood sugar in 15 minutes after treatment (to make sure it is greater than 70 mg/dL). If your blood sugar is not greater than 70 mg/dL on recheck, call [phone] for further instructions. ?Report your blood sugar to the short stay nurse when you get to Short Stay. ? ?If you are admitted to the hospital after surgery: ?Your blood sugar will be checked by the staff and you will probably be given insulin after surgery (instead of oral diabetes medicines) to make sure you have good blood sugar levels. ?The goal for blood sugar control after surgery is 80-180 mg/dL.  ? ?         ?Do not wear jewelry  ?Do not wear lotions, powders, colognes, or deodorant. ?Do not shave 48 hours prior to surgery.  Men may shave  face and neck. ?Do not bring valuables to the hospital. ? ? ?[facility] is not responsible for any belongings or valuables. .  ? ?Do NOT Smoke (Tobacco/Vaping)  24 hours prior to your procedure ? ?If you use a CPAP at night, you may bring your mask for your overnight stay. ?  ?Contacts, glasses, hearing aids, dentures or partials may not be worn into surgery, please bring cases for these belongings ?  ?For patients admitted to the hospital, discharge time will be determined by your treatment team. ?  ?Patients  discharged the day of surgery will not be allowed to drive home, and someone needs to stay with them for 24 hours. ? ?NO VISITORS WILL BE ALLOWED IN PRE-OP WHERE PATIENTS ARE PREPPED FOR SURGERY.  ONLY 1 SUPPORT PERSON MAY BE PRESENT IN THE WAITING ROOM WHILE YOU ARE IN SURGERY.  IF YOU ARE TO BE ADMITTED, ONCE YOU ARE IN YOUR ROOM YOU WILL BE ALLOWED TWO (2) VISITORS. 1 (ONE) VISITOR MAY STAY OVERNIGHT BUT MUST ARRIVE TO THE ROOM BY 8pm.  Minor children may have two parents present. Special consideration for safety and communication needs will be reviewed on a case by case basis. ? ?Special instructions:   ? ?Oral Hygiene is also important to reduce your risk of infection.  Remember - BRUSH YOUR TEETH THE MORNING OF SURGERY WITH YOUR REGULAR TOOTHPASTE ? ? ?[place]- Preparing For Surgery ? ?Before surgery, you can play an important role. Because skin is not sterile, your skin needs to be as free of germs as possible. You can reduce the number of germs on your skin by washing with CHG (chlorahexidine gluconate) Soap before surgery.  CHG is an antiseptic cleaner which kills germs and bonds with the skin to continue killing germs even after washing.   ? ? ?Please do not use if you have an allergy to CHG or antibacterial soaps. If your skin becomes reddened/irritated stop using the CHG.  ?Do not shave (including legs and underarms) for at least 48 hours prior to first CHG shower. It is OK to shave your face. ? ?Please follow these instructions carefully. ?  ? ? Shower the NIGHT BEFORE SURGERY and the MORNING OF SURGERY with CHG Soap.  ? If you chose to wash your hair, wash your hair first as usual with your normal shampoo. After you shampoo, rinse your hair and body thoroughly to remove the shampoo.  Then [employer] and genitals (private parts) with your normal soap and rinse thoroughly to remove soap. ? ?After that Use CHG Soap as you would any other liquid soap. You can apply CHG directly to the skin and wash  gently with a scrungie or a clean washcloth.  ? ?Apply the CHG Soap to your body ONLY FROM THE NECK DOWN.  Do not use on open wounds or open sores. Avoid contact with your eyes, ears, mouth and genitals (private parts). Wash Face and genitals (private parts)  with your normal soap.  ? ?Wash thoroughly, paying special attention to the area where your surgery will be performed. ? ?Thoroughly rinse your body with warm water from the neck down. ? ?DO NOT shower/wash with your normal soap after using and rinsing off the CHG Soap. ? ?Pat yourself dry with a CLEAN TOWEL. ? ?Wear CLEAN PAJAMAS to bed the night before surgery ? ?Place CLEAN SHEETS on your bed the night before your surgery ? ?DO NOT SLEEP WITH PETS. ? ? ?Day of Surgery: ? ?Take a  shower with CHG soap. ?Wear Clean/Comfortable clothing the morning of surgery ?Do not apply any deodorants/lotions.   ?Remember to brush your teeth WITH YOUR REGULAR TOOTHPASTE. ? ? ? ?COVID testing ? ?If you are going to stay overnight or be admitted after your procedure/surgery and require a pre-op COVID test, please follow these instructions after your COVID test  ? ?You are not required to quarantine however you are required to wear a well-fitting mask when you are out and around people not in your household.  If your mask becomes wet or soiled, replace with a new one. ? ?Wash your hands often with soap and water for 20 seconds or clean your hands with an alcohol-based hand sanitizer that contains at least 60% alcohol. ? ?Do not share personal items. ? ?Notify your provider: ?if you are in close contact with someone who has COVID  ?or if you develop a fever of 100.4 or greater, sneezing, cough, sore throat, shortness of breath or body aches. ? ?  ?Please read over the following fact sheets that you were given.   ?

## 2021-08-02 ENCOUNTER — Encounter (HOSPITAL_COMMUNITY): Payer: Self-pay

## 2021-08-02 ENCOUNTER — Telehealth: Payer: Self-pay

## 2021-08-02 ENCOUNTER — Encounter (HOSPITAL_COMMUNITY)
Admission: RE | Admit: 2021-08-02 | Discharge: 2021-08-02 | Disposition: A | Discharge status: Home / Self Care | Payer: Medicare Other | Source: Ambulatory Visit | Attending: Neurological Surgery | Admitting: Neurological Surgery

## 2021-08-02 ENCOUNTER — Other Ambulatory Visit: Payer: Self-pay

## 2021-08-02 VITALS — BP 160/83 | HR 100 | Temp 97.8°F | Resp 18 | Ht 70.0 in | Wt 181.8 lb

## 2021-08-02 DIAGNOSIS — E119 Type 2 diabetes mellitus without complications: Secondary | ICD-10-CM | POA: Insufficient documentation

## 2021-08-02 DIAGNOSIS — Z01818 Encounter for other preprocedural examination: Secondary | ICD-10-CM | POA: Diagnosis not present

## 2021-08-02 HISTORY — DX: Nausea with vomiting, unspecified: R11.2

## 2021-08-02 HISTORY — DX: Other specified postprocedural states: Z98.890

## 2021-08-02 LAB — COMPREHENSIVE METABOLIC PANEL
ALT: 56 U/L — ABNORMAL HIGH (ref 0–44)
AST: 46 U/L — ABNORMAL HIGH (ref 15–41)
Albumin: 4.1 g/dL (ref 3.5–5.0)
Alkaline Phosphatase: 89 U/L (ref 38–126)
Anion gap: 8 (ref 5–15)
BUN: 13 mg/dL (ref 8–23)
CO2: 28 mmol/L (ref 22–32)
Calcium: 9.4 mg/dL (ref 8.9–10.3)
Chloride: 100 mmol/L (ref 98–111)
Creatinine, Ser: 0.82 mg/dL (ref 0.61–1.24)
GFR, Estimated: >60 mL/min (ref >60)
Glucose, Bld: 276 mg/dL — ABNORMAL HIGH (ref 70–99)
Potassium: 3.7 mmol/L (ref 3.5–5.1)
Sodium: 136 mmol/L (ref 135–145)
Total Bilirubin: 0.8 mg/dL (ref 0.3–1.2)
Total Protein: 7.5 g/dL (ref 6.5–8.1)

## 2021-08-02 LAB — SURGICAL PCR SCREEN
MRSA, PCR: NEGATIVE
Staphylococcus aureus: POSITIVE — AB

## 2021-08-02 LAB — PROTIME-INR
INR: 1.1 (ref 0.8–1.2)
Prothrombin Time: 14.2 seconds (ref 11.4–15.2)

## 2021-08-02 LAB — GLUCOSE, CAPILLARY: Glucose-Capillary: 282 mg/dL — ABNORMAL HIGH (ref 70–99)

## 2021-08-02 NOTE — Progress Notes (Signed)
Surgical Instructions ? ? ? Your procedure is scheduled on 08/09/21. ? Report to [facility] Main Entrance "A" at 5:30 A.M., then check in with the Admitting office. ? Call this number if you have problems the morning of surgery: ? [phone] ? ? If you have any questions prior to your surgery date call [phone]: Open Monday-Friday 8am-4pm ? ? ? Remember: ? Do not eat or drink after midnight the night before your surgery ? ? ?  ? Take these medicines the morning of surgery with A SIP OF WATER:  ? ?atorvastatin (LIPITOR) ? ?If NEEDED: ?diazepam (VALIUM) ?tamsulosin [facility]) ?pantoprazole (PROTONIX) ?Acetaminophen (Tylenol) ? ?As of today, STOP taking any Aspirin (unless otherwise instructed by your surgeon) Aleve, Naproxen, Ibuprofen, Motrin, Advil, Goody's, BC's, all herbal medications, fish oil, and all vitamins. ? ?WHAT DO I DO ABOUT MY DIABETES MEDICATION? ? ? ?Do not take oral diabetes medicines (pills) the morning of surgery.  DO NOT take metFORMIN (GLUCOPHAGE) day of surgery.  ? ? ?THE MORNING OF SURGERY, take 16 units (take 50 %- half of normal dose) of Insulin glargine (LANTUS SOLOSTAR). ? ?The day of surgery, do not take other diabetes injectables, including Byetta (exenatide), Bydureon (exenatide ER), Victoza (liraglutide), or Trulicity (dulaglutide). ? ?If your CBG is greater than 220 mg/dL, you may take ? of your sliding scale (correction) dose of insulin. ? ? ?HOW TO MANAGE YOUR DIABETES ?BEFORE AND AFTER SURGERY ? ?Why is it important to control my blood sugar before and after surgery? ?Improving blood sugar levels before and after surgery helps healing and can limit problems. ?A way of improving blood sugar control is eating a healthy diet by: ? Eating less sugar and carbohydrates ? Increasing activity/exercise ? Talking with your doctor about reaching your blood sugar goals ?High blood sugars (greater than 180 mg/dL) can raise your risk of infections and slow your recovery, so you will need to  focus on controlling your diabetes during the weeks before surgery. ?Make sure that the doctor who takes care of your diabetes knows about your planned surgery including the date and location. ? ?How do I manage my blood sugar before surgery? ?Check your blood sugar at least 4 times a day, starting 2 days before surgery, to make sure that the level is not too high or low. ? ?Check your blood sugar the morning of your surgery when you wake up and every 2 hours until you get to the Short Stay unit. ? ?If your blood sugar is less than 70 mg/dL, you will need to treat for low blood sugar: ?Do not take insulin. ?Treat a low blood sugar (less than 70 mg/dL) with ? cup of clear juice (cranberry or apple), 4 glucose tablets, OR glucose gel. ?Recheck blood sugar in 15 minutes after treatment (to make sure it is greater than 70 mg/dL). If your blood sugar is not greater than 70 mg/dL on recheck, call [phone] for further instructions. ?Report your blood sugar to the short stay nurse when you get to Short Stay. ? ?If you are admitted to the hospital after surgery: ?Your blood sugar will be checked by the staff and you will probably be given insulin after surgery (instead of oral diabetes medicines) to make sure you have good blood sugar levels. ?The goal for blood sugar control after surgery is 80-180 mg/dL.  ? ?         ?Do not wear jewelry  ?Do not wear lotions, powders, colognes, or deodorant. ?Do not shave 48  hours prior to surgery.  Men may shave face and neck. ?Do not bring valuables to the hospital. ? ? ?[facility] is not responsible for any belongings or valuables. .  ? ?Do NOT Smoke (Tobacco/Vaping)  24 hours prior to your procedure ? ?If you use a CPAP at night, you may bring your mask for your overnight stay. ?  ?Contacts, glasses, hearing aids, dentures or partials may not be worn into surgery, please bring cases for these belongings ?  ?For patients admitted to the hospital, discharge time will be determined  by your treatment team. ?  ?Patients discharged the day of surgery will not be allowed to drive home, and someone needs to stay with them for 24 hours. ? ?NO VISITORS WILL BE ALLOWED IN PRE-OP WHERE PATIENTS ARE PREPPED FOR SURGERY.  ONLY 1 SUPPORT PERSON MAY BE PRESENT IN THE WAITING ROOM WHILE YOU ARE IN SURGERY.  IF YOU ARE TO BE ADMITTED, ONCE YOU ARE IN YOUR ROOM YOU WILL BE ALLOWED TWO (2) VISITORS. 1 (ONE) VISITOR MAY STAY OVERNIGHT BUT MUST ARRIVE TO THE ROOM BY 8pm.  Minor children may have two parents present. Special consideration for safety and communication needs will be reviewed on a case by case basis. ? ?Special instructions:   ? ?Oral Hygiene is also important to reduce your risk of infection.  Remember - BRUSH YOUR TEETH THE MORNING OF SURGERY WITH YOUR REGULAR TOOTHPASTE ? ? ?[facility]- Preparing For Surgery ? ?Before surgery, you can play an important role. Because skin is not sterile, your skin needs to be as free of germs as possible. You can reduce the number of germs on your skin by washing with CHG (chlorahexidine gluconate) Soap before surgery.  CHG is an antiseptic cleaner which kills germs and bonds with the skin to continue killing germs even after washing.   ? ? ?Please do not use if you have an allergy to CHG or antibacterial soaps. If your skin becomes reddened/irritated stop using the CHG.  ?Do not shave (including legs and underarms) for at least 48 hours prior to first CHG shower. It is OK to shave your face. ? ?Please follow these instructions carefully. ?  ? ? Shower the NIGHT BEFORE SURGERY and the MORNING OF SURGERY with CHG Soap.  ? If you chose to wash your hair, wash your hair first as usual with your normal shampoo. After you shampoo, rinse your hair and body thoroughly to remove the shampoo.  Then [employer] and genitals (private parts) with your normal soap and rinse thoroughly to remove soap. ? ?After that Use CHG Soap as you would any other liquid soap. You can apply  CHG directly to the skin and wash gently with a scrungie or a clean washcloth.  ? ?Apply the CHG Soap to your body ONLY FROM THE NECK DOWN.  Do not use on open wounds or open sores. Avoid contact with your eyes, ears, mouth and genitals (private parts). Wash Face and genitals (private parts)  with your normal soap.  ? ?Wash thoroughly, paying special attention to the area where your surgery will be performed. ? ?Thoroughly rinse your body with warm water from the neck down. ? ?DO NOT shower/wash with your normal soap after using and rinsing off the CHG Soap. ? ?Pat yourself dry with a CLEAN TOWEL. ? ?Wear CLEAN PAJAMAS to bed the night before surgery ? ?Place CLEAN SHEETS on your bed the night before your surgery ? ?DO NOT SLEEP WITH PETS. ? ? ?  Day of Surgery: ? ?Take a shower with CHG soap. ?Wear Clean/Comfortable clothing the morning of surgery ?Do not apply any deodorants/lotions.   ?Remember to brush your teeth WITH YOUR REGULAR TOOTHPASTE. ? ? ? ?COVID testing ? ?If you are going to stay overnight or be admitted after your procedure/surgery and require a pre-op COVID test, please follow these instructions after your COVID test  ? ?You are not required to quarantine however you are required to wear a well-fitting mask when you are out and around people not in your household.  If your mask becomes wet or soiled, replace with a new one. ? ?Wash your hands often with soap and water for 20 seconds or clean your hands with an alcohol-based hand sanitizer that contains at least 60% alcohol. ? ?Do not share personal items. ? ?Notify your provider: ?if you are in close contact with someone who has COVID  ?or if you develop a fever of 100.4 or greater, sneezing, cough, sore throat, shortness of breath or body aches. ? ?  ?Please read over the following fact sheets that you were given.   ?

## 2021-08-02 NOTE — Progress Notes (Signed)
PCP - Jillyn Ledger ?Cardiologist - Dr Geraldo Pitter ? ?Chest x-ray - N/A ?EKG - 08/02/2021  ?Stress Test - in [place] "many years" ago per patient, unsure where. Pt reports normal test ?ECHO - 08/12/20- pt reports this was done to assess heart murmur ?Cardiac Cath - pt denies ? ?Sleep Study - pt denies  ? ?Fasting Blood Sugar - 282 in PAT- pt reports he ate at 7:30 this AM, and did take his diabetic medications. Pt reports that he has not been checking his blood sugars at home, but his last A1c was 7.4 (within the last month per patient). Patient stated he would bring the result with him DOS. Result requested from PCP.  ? ?Aspirin Instructions: As of today, STOP taking any Aspirin (unless otherwise instructed by your surgeon) Aleve, Naproxen, Ibuprofen, Motrin, Advil, Goody's, BC's, all herbal medications, fish oil, and all vitamins. ? ?ERAS Protcol -not ordered ? ?COVID TEST- N/A per policy, pt denies Covid symptoms or exposure to Covid.  ? ?BP 177/87 in PAT- 160/83 with re-check. Pt asymptomatic. Pt stated he has not taken his blood pressure medications today, and needed to get refills when he left PAT appointment. Pt states that he checks his BP once a week and is usually "around 138/80" per patient.  ? ?Anesthesia review: yes, history of heart murmur, review cardiac tests.  ? ?Patient denies shortness of breath, fever, cough and chest pain at PAT appointment ? ? ?All instructions explained to the patient, with a verbal understanding of the material. Patient agrees to go over the instructions while at home for a better understanding. The opportunity to ask questions was provided. ? ? ?

## 2021-08-02 NOTE — Telephone Encounter (Signed)
Called and spoke with the patient to let him know his iron level remains in the low normal range. Continue iron 3 times daily, follow-up as scheduled, repeat ferritin next visit. Patient gave verbal understanding and denied further questions. ?

## 2021-08-02 NOTE — Telephone Encounter (Signed)
-----   Message from Ladell Pier, MD sent at 08/01/2021  4:29 PM EDT ----- ?Please call patient, iron level remains in the low normal range, continue iron 3 times daily, follow-up as scheduled, repeat ferritin next visit ? ?

## 2021-08-03 NOTE — Progress Notes (Signed)
Anesthesia Chart Review: ? ? Case: [id number] Date/Time: 08/09/21 0715  ? Procedure: lumbar decompressive laminectomy  - L2-L3 - L3-L4 - L4-L5 - L5-S1 - bilateral (Bilateral: Back)  ? Anesthesia type: General  ? Pre-op diagnosis: Stenosis  ? Location: [facility] OR ROOM 21 / [facility] OR  ? Surgeons: Eustace Moore, MD  ? ?  ? ? ?DISCUSSION: ?Pt is an 81 year old with hx aortic stenosis (moderate by 08/12/20 echo), HTN, DM, iron deficiency anemia ? ?Pt denied chest pain or shortness of breath at pre-admission testing.  ? ? ?VS: BP (!) 160/83   Pulse 100   Temp 36.6 ?C (Oral)   Resp 18   Ht '5\' 10"'$  (1.778 m)   Wt 82.5 kg   SpO2 98%   BMI 26.09 kg/m?  ? ?PROVIDERS: ?- PCP is Kristen Loader, FNP ?- Cardiologist is Jyl Heinz, MD. Last office visit 07/14/20 ? ?LABS:  ?- Glucose 276 ?- HbA1c was 7.4 at PCP's office on 07/10/20 ?- CBC w/diff 07/31/21: H/H 11.7/35.5 ? ?(all labs ordered are listed, but only abnormal results are displayed) ? ?Labs Reviewed  ?SURGICAL PCR SCREEN - Abnormal; Notable for the following components:  ?    Result Value  ? Staphylococcus aureus POSITIVE (*)   ? All other components within normal limits  ?GLUCOSE, CAPILLARY - Abnormal; Notable for the following components:  ? Glucose-Capillary 282 (*)   ? All other components within normal limits  ?COMPREHENSIVE METABOLIC PANEL - Abnormal; Notable for the following components:  ? Glucose, Bld 276 (*)   ? AST 46 (*)   ? ALT 56 (*)   ? All other components within normal limits  ?PROTIME-INR  ? ? ?EKG 08/02/21: NSR. RBBB ? ? ?CV: ?Echo 08/12/20:  ?1. Left ventricular ejection fraction, by estimation, is 60 to 65%. The left ventricle has normal function. The left ventricle has no regional wall motion abnormalities. 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 Left ventricular diastolic parameters are consistent with Grade II diastolic dysfunction (pseudonormalization).  ?2. Right ventricular systolic function is normal. The right  ventricular  ?size is normal. There is normal pulmonary artery systolic pressure.  ?3. The mitral valve is normal in structure. Mild mitral valve  ?regurgitation. No evidence of mitral stenosis.  ?4. The aortic valve is calcified. There is moderate calcification of the aortic valve. There is mild thickening of the aortic valve. Aortic valve regurgitation is mild. Moderate aortic valve stenosis. Aortic valve area, by VTI measures 1.38 cm?Marland Kitchen Aortic valve mean gradient measures 20.0 mmHg. Aortic valve Vmax measures 3.03 m/s.  ?5. The inferior vena cava is normal in size with greater than 50%  ?respiratory variability, suggesting right atrial pressure of 3 mmHg.  ? ? ?Past Medical History:  ?Diagnosis Date  ? Aortic regurgitation 07/14/2020  ? Aortic stenosis, moderate 03/17/2019  ? 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 Benign prostatic hyperplasia without lower urinary tract symptoms 07/12/2020  ? Cardiac murmur 12/05/2018  ? Diabetes mellitus due to underlying condition with unspecified complications ([place]) 35/46/5681  ? Essential hypertension 12/05/2018  ? 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 History of pancreatitis 07/12/2020  ? History of pancytopenia 07/12/2020  ? Hyperlipidemia, unspecified 07/12/2020  ? Irregular heart beat 07/12/2020  ? Long term (current) use of insulin ([place]) 07/12/2020  ? 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 Lumbar radiculopathy 06/17/2020  ? Lumbar spondylosis 07/28/2020  ? Malignant neoplasm of prostate ([place]) 06/24/2019  ? Mood disorder ([place]) 07/12/2020  ?  Pain of left hip joint 08/25/2019  ? PONV (postoperative nausea and vomiting)   ? Prostate cancer ([place])   ? 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 Systolic murmur 15/94/5859  ? Trochanteric bursitis of left hip 03/12/2021  ? Trochanteric bursitis of right hip 03/12/2021  ? Type 2 diabetes mellitus with other specified complication ([place]) 29/24/4628  ? ? ?Past Surgical History:  ?Procedure Laterality Date  ? BACK SURGERY    ?  lumbar surgery 20 years ago per patient  ? CHOLECYSTECTOMY    ? 20 years ago per patient  ? TONSILLECTOMY    ? as a kid  ? ? ?MEDICATIONS: ? acetaminophen (TYLENOL) 500 MG tablet  ? atorvastatin (LIPITOR) 20 MG tablet  ? CALCIUM PO  ? diazepam (VALIUM) 5 MG tablet  ? ferrous sulfate 325 (65 FE) MG tablet  ? insulin glargine (LANTUS SOLOSTAR) 100 UNIT/ML Solostar Pen  ? Krill Oil 500 MG CAPS  ? losartan (COZAAR) 100 MG tablet  ? losartan (COZAAR) 100 MG tablet  ? metFORMIN (GLUCOPHAGE) 500 MG tablet  ? Multiple Vitamin (MULTIVITAMIN WITH MINERALS) TABS tablet  ? pantoprazole (PROTONIX) 40 MG tablet  ? tamsulosin (FLOMAX) 0.4 MG CAPS capsule  ? triamcinolone cream (KENALOG) 0.1 %  ? ?No current facility-administered medications for this encounter.  ? ? ?If no changes, I anticipate pt can proceed with surgery as scheduled.  ? ?Willeen Cass, PhD, FNP-BC ?[facility]/Anesthesiology ?Phone: [phone] ?08/03/2021 2:23 PM ? ? ? ? ? ?

## 2021-08-03 NOTE — Anesthesia Preprocedure Evaluation (Addendum)
Anesthesia Evaluation  ?Patient identified by MRN, date of birth, ID band ?Patient awake ? ? ? ?Reviewed: ?Allergy & Precautions, NPO status , Patient's Chart, lab work & pertinent test results ? ?History of Anesthesia Complications ?(+) PONV and history of anesthetic complications ? ?Airway ?Mallampati: I ? ? ? ? ? ? Dental ?no notable dental hx. ?(+) Teeth Intact ?  ?Pulmonary ?neg pulmonary ROS,  ?  ?Pulmonary exam normal ? ? ? ? ? ? ? Cardiovascular ?hypertension, Pt. on medications ?Normal cardiovascular exam+ Valvular Problems/Murmurs AI and AS  ?+ Systolic murmurs ? ?  ?Neuro/Psych ?  ? GI/Hepatic ?negative GI ROS,   ?Endo/Other  ?diabetes, Type 2, Oral Hypoglycemic Agents ? Renal/GU ?  ? ?  ?Musculoskeletal ? ? Abdominal ?Normal abdominal exam  (+)   ?Peds ? Hematology ? ?(+) Blood dyscrasia, anemia ,   ?Anesthesia Other Findings ?1. Left ventricular ejection fraction, by estimation, is 60 to 65%. The left ventricle has normal ?function. The left ventricle has no regional wall motion abnormalities. There is severe ?asymmetric left ventricular hypertrophy of the septal segment. Left ventricular diastolic ?parameters are consistent with Grade II diastolic dysfunction (pseudonormalization). ?2. Right ventricular systolic function is normal. The right ventricular size is normal. There is ?normal pulmonary artery systolic pressure. ?3. The mitral valve is normal in structure. Mild mitral valve regurgitation. No evidence of mitral ?stenosis. ?4. The aortic valve is calcified. There is moderate calcification of the aortic valve. There is mild ?thickening of the aortic valve. Aortic valve regurgitation is mild. Moderate aortic valve ?stenosis. Aortic valve area, by VTI measures 1.38 cm?Marland Kitchen Aortic valve mean gradient measures ?20.0 mmHg. Aortic valve Vmax measures 3.03 m/s. ?5. The inferior vena cava is normal in size with greater than 50% respiratory variability, ?suggesting right  atrial pressure of 3 mmHg. ?Left ? Reproductive/Obstetrics ? ?  ? ? ? ? ? ? ? ? ? ? ? ? ? ?  ?  ? ? ? ? ? ?Anesthesia Physical ?Anesthesia Plan ? ?ASA: 3 ? ?Anesthesia Plan: General  ? ?Post-op Pain Management: Dilaudid IV  ? ?Induction: Intravenous ? ?PONV Risk Score and Plan: Ondansetron, Dexamethasone and Midazolam ? ?Airway Management Planned: Oral ETT ? ?Additional Equipment: None ? ?Intra-op Plan:  ? ?Post-operative Plan: Extubation in OR ? ?Informed Consent: I have reviewed the patients History and Physical, chart, labs and discussed the procedure including the risks, benefits and alternatives for the proposed anesthesia with the patient or authorized representative who has indicated his/her understanding and acceptance.  ? ? ? ?Dental advisory given ? ?Plan Discussed with: CRNA ? ?Anesthesia Plan Comments: (See APP note by Durel Salts, FNP )  ? ? ? ? ?Anesthesia Quick Evaluation ? ?

## 2021-08-07 ENCOUNTER — Other Ambulatory Visit (HOSPITAL_COMMUNITY): Payer: Medicare Other

## 2021-08-09 ENCOUNTER — Observation Stay (HOSPITAL_COMMUNITY)
Admission: RE | Admit: 2021-08-09 | Discharge: 2021-08-10 | Disposition: A | Discharge status: Home / Self Care | Payer: Medicare Other | Attending: Neurological Surgery | Admitting: Neurological Surgery

## 2021-08-09 ENCOUNTER — Encounter (HOSPITAL_COMMUNITY)
Admission: RE | Disposition: A | Discharge status: Home / Self Care | Payer: Self-pay | Source: Home / Self Care | Attending: Neurological Surgery

## 2021-08-09 ENCOUNTER — Ambulatory Visit (HOSPITAL_BASED_OUTPATIENT_CLINIC_OR_DEPARTMENT_OTHER): Payer: Medicare Other

## 2021-08-09 ENCOUNTER — Encounter (HOSPITAL_COMMUNITY): Payer: Self-pay | Admitting: Neurological Surgery

## 2021-08-09 ENCOUNTER — Other Ambulatory Visit: Payer: Self-pay

## 2021-08-09 ENCOUNTER — Ambulatory Visit (HOSPITAL_COMMUNITY): Payer: Medicare Other

## 2021-08-09 ENCOUNTER — Ambulatory Visit (HOSPITAL_COMMUNITY): Payer: Medicare Other | Admitting: Emergency Medicine

## 2021-08-09 DIAGNOSIS — Z794 Long term (current) use of insulin: Secondary | ICD-10-CM | POA: Diagnosis not present

## 2021-08-09 DIAGNOSIS — Z8546 Personal history of malignant neoplasm of prostate: Secondary | ICD-10-CM | POA: Diagnosis not present

## 2021-08-09 DIAGNOSIS — Z79899 Other long term (current) drug therapy: Secondary | ICD-10-CM | POA: Insufficient documentation

## 2021-08-09 DIAGNOSIS — M4856XA Collapsed vertebra, not elsewhere classified, lumbar region, initial encounter for fracture: Secondary | ICD-10-CM | POA: Insufficient documentation

## 2021-08-09 DIAGNOSIS — I10 Essential (primary) hypertension: Secondary | ICD-10-CM | POA: Diagnosis not present

## 2021-08-09 DIAGNOSIS — M4316 Spondylolisthesis, lumbar region: Secondary | ICD-10-CM | POA: Insufficient documentation

## 2021-08-09 DIAGNOSIS — E119 Type 2 diabetes mellitus without complications: Secondary | ICD-10-CM | POA: Diagnosis not present

## 2021-08-09 DIAGNOSIS — M48061 Spinal stenosis, lumbar region without neurogenic claudication: Secondary | ICD-10-CM | POA: Diagnosis present

## 2021-08-09 DIAGNOSIS — Z9889 Other specified postprocedural states: Secondary | ICD-10-CM

## 2021-08-09 DIAGNOSIS — Z7984 Long term (current) use of oral hypoglycemic drugs: Secondary | ICD-10-CM | POA: Diagnosis not present

## 2021-08-09 HISTORY — PX: LUMBAR LAMINECTOMY/DECOMPRESSION MICRODISCECTOMY: SHX5026

## 2021-08-09 HISTORY — DX: Other specified postprocedural states: Z98.890

## 2021-08-09 LAB — GLUCOSE, CAPILLARY
Glucose-Capillary: 117 mg/dL — ABNORMAL HIGH (ref 70–99)
Glucose-Capillary: 112 mg/dL — ABNORMAL HIGH (ref 70–99)
Glucose-Capillary: 109 mg/dL — ABNORMAL HIGH (ref 70–99)
Glucose-Capillary: 230 mg/dL — ABNORMAL HIGH (ref 70–99)
Glucose-Capillary: 154 mg/dL — ABNORMAL HIGH (ref 70–99)

## 2021-08-09 LAB — HEMOGLOBIN A1C
Hgb A1c MFr Bld: 6.9 % — ABNORMAL HIGH (ref 4.8–5.6)
Mean Plasma Glucose: 151.33 mg/dL

## 2021-08-09 SURGERY — LUMBAR LAMINECTOMY/DECOMPRESSION MICRODISCECTOMY 4 LEVEL
Anesthesia: General | Site: Spine Lumbar | Laterality: Bilateral

## 2021-08-09 MED ORDER — LOSARTAN POTASSIUM 50 MG PO TABS
100.0000 mg | ORAL_TABLET | Freq: Every day | ORAL | Status: DC
  Filled 2021-08-09: qty 2

## 2021-08-09 MED ORDER — LIDOCAINE 2% (20 MG/ML) 5 ML SYRINGE
INTRAMUSCULAR | Status: DC | PRN
  Administered 2021-08-09: 80 mg via INTRAVENOUS

## 2021-08-09 MED ORDER — INSULIN ASPART 100 UNIT/ML IJ SOLN
0.0000 [IU] | Freq: Three times a day (TID) | INTRAMUSCULAR | Status: DC
  Administered 2021-08-09: 5 [IU] via SUBCUTANEOUS
  Administered 2021-08-10: 2 [IU] via SUBCUTANEOUS

## 2021-08-09 MED ORDER — BUPIVACAINE HCL (PF) 0.25 % IJ SOLN
INTRAMUSCULAR | Status: DC | PRN
  Administered 2021-08-09: 10 mL
  Administered 2021-08-09: 7 mL

## 2021-08-09 MED ORDER — ONDANSETRON HCL 4 MG/2ML IJ SOLN: 4.0000 mg | Freq: Once | INTRAMUSCULAR | Status: DC | PRN

## 2021-08-09 MED ORDER — SENNA 8.6 MG PO TABS
1.0000 | ORAL_TABLET | Freq: Two times a day (BID) | ORAL | Status: DC
  Administered 2021-08-09 – 2021-08-10 (×3): 8.6 mg via ORAL
  Filled 2021-08-09 (×3): qty 1

## 2021-08-09 MED ORDER — GLYCOPYRROLATE 0.2 MG/ML IJ SOLN
INTRAMUSCULAR | Status: DC | PRN
Start: 2021-08-09 — End: 2021-08-09
  Administered 2021-08-09: .2 mg via INTRAVENOUS

## 2021-08-09 MED ORDER — THROMBIN 5000 UNITS EX SOLR
CUTANEOUS | Status: AC
  Filled 2021-08-09: qty 10000

## 2021-08-09 MED ORDER — DEXAMETHASONE SODIUM PHOSPHATE 10 MG/ML IJ SOLN
INTRAMUSCULAR | Status: AC
  Filled 2021-08-09: qty 1

## 2021-08-09 MED ORDER — GABAPENTIN 300 MG PO CAPS
ORAL_CAPSULE | ORAL | Status: AC
  Administered 2021-08-09: 300 mg via ORAL
  Filled 2021-08-09: qty 1

## 2021-08-09 MED ORDER — FENTANYL CITRATE (PF) 250 MCG/5ML IJ SOLN
INTRAMUSCULAR | Status: DC | PRN
  Administered 2021-08-09: 100 ug via INTRAVENOUS

## 2021-08-09 MED ORDER — GELATIN ABSORBABLE MT POWD: OROMUCOSAL | Status: DC | PRN

## 2021-08-09 MED ORDER — PHENYLEPHRINE 40 MCG/ML (10ML) SYRINGE FOR IV PUSH (FOR BLOOD PRESSURE SUPPORT)
PREFILLED_SYRINGE | INTRAVENOUS | Status: DC | PRN
Start: 2021-08-09 — End: 2021-08-09
  Administered 2021-08-09: 80 ug via INTRAVENOUS
  Administered 2021-08-09: 40 ug via INTRAVENOUS
  Administered 2021-08-09 (×2): 120 ug via INTRAVENOUS
  Administered 2021-08-09: 80 ug via INTRAVENOUS

## 2021-08-09 MED ORDER — KETOROLAC TROMETHAMINE 15 MG/ML IJ SOLN
15.0000 mg | Freq: Once | INTRAMUSCULAR | Status: AC
  Administered 2021-08-09: 15 mg via INTRAVENOUS

## 2021-08-09 MED ORDER — ONDANSETRON HCL 4 MG/2ML IJ SOLN: 4.0000 mg | Freq: Four times a day (QID) | INTRAMUSCULAR | Status: DC | PRN

## 2021-08-09 MED ORDER — PANTOPRAZOLE SODIUM 40 MG PO TBEC: 40.0000 mg | DELAYED_RELEASE_TABLET | Freq: Every day | ORAL | Status: DC | PRN

## 2021-08-09 MED ORDER — PHENOL 1.4 % MT LIQD: 1.0000 | OROMUCOSAL | Status: DC | PRN

## 2021-08-09 MED ORDER — DEXAMETHASONE SODIUM PHOSPHATE 10 MG/ML IJ SOLN
INTRAMUSCULAR | Status: DC | PRN
  Administered 2021-08-09: 5 mg via INTRAVENOUS

## 2021-08-09 MED ORDER — METFORMIN HCL 500 MG PO TABS
1000.0000 mg | ORAL_TABLET | Freq: Every day | ORAL | Status: DC
  Administered 2021-08-09 – 2021-08-10 (×2): 1000 mg via ORAL
  Filled 2021-08-09 (×2): qty 2

## 2021-08-09 MED ORDER — VANCOMYCIN HCL IN DEXTROSE 1-5 GM/200ML-% IV SOLN
INTRAVENOUS | Status: AC
  Administered 2021-08-09: 1000 mg via INTRAVENOUS
  Filled 2021-08-09: qty 200

## 2021-08-09 MED ORDER — 0.9 % SODIUM CHLORIDE (POUR BTL) OPTIME
TOPICAL | Status: DC | PRN
  Administered 2021-08-09: 1000 mL

## 2021-08-09 MED ORDER — GABAPENTIN 300 MG PO CAPS: 300.0000 mg | ORAL_CAPSULE | ORAL | Status: AC

## 2021-08-09 MED ORDER — VANCOMYCIN HCL IN DEXTROSE 1-5 GM/200ML-% IV SOLN: 1000.0000 mg | INTRAVENOUS | Status: AC

## 2021-08-09 MED ORDER — CHLORHEXIDINE GLUCONATE 0.12 % MT SOLN
OROMUCOSAL | Status: AC
  Administered 2021-08-09: 15 mL via OROMUCOSAL
  Filled 2021-08-09: qty 15

## 2021-08-09 MED ORDER — CEFAZOLIN SODIUM-DEXTROSE 2-4 GM/100ML-% IV SOLN
INTRAVENOUS | Status: AC
  Filled 2021-08-09: qty 100

## 2021-08-09 MED ORDER — ORAL CARE MOUTH RINSE: 15.0000 mL | Freq: Once | OROMUCOSAL | Status: AC

## 2021-08-09 MED ORDER — CHLORHEXIDINE GLUCONATE CLOTH 2 % EX PADS: 6.0000 | MEDICATED_PAD | Freq: Once | CUTANEOUS | Status: DC

## 2021-08-09 MED ORDER — METHOCARBAMOL 500 MG PO TABS
500.0000 mg | ORAL_TABLET | Freq: Four times a day (QID) | ORAL | Status: DC | PRN
  Administered 2021-08-09 – 2021-08-10 (×3): 500 mg via ORAL
  Filled 2021-08-09 (×4): qty 1

## 2021-08-09 MED ORDER — ROCURONIUM BROMIDE 10 MG/ML (PF) SYRINGE
PREFILLED_SYRINGE | INTRAVENOUS | Status: AC
  Filled 2021-08-09: qty 10

## 2021-08-09 MED ORDER — PHENYLEPHRINE HCL-NACL 20-0.9 MG/250ML-% IV SOLN
INTRAVENOUS | Status: DC | PRN
  Administered 2021-08-09: 30 ug/min via INTRAVENOUS

## 2021-08-09 MED ORDER — ONDANSETRON HCL 4 MG/2ML IJ SOLN
INTRAMUSCULAR | Status: DC | PRN
  Administered 2021-08-09: 4 mg via INTRAVENOUS

## 2021-08-09 MED ORDER — POTASSIUM CHLORIDE IN NACL 20-0.9 MEQ/L-% IV SOLN: INTRAVENOUS | Status: DC

## 2021-08-09 MED ORDER — SODIUM CHLORIDE 0.9% FLUSH
3.0000 mL | Freq: Two times a day (BID) | INTRAVENOUS | Status: DC
  Administered 2021-08-09 (×2): 3 mL via INTRAVENOUS

## 2021-08-09 MED ORDER — SODIUM CHLORIDE 0.9 % IV SOLN
250.0000 mL | INTRAVENOUS | Status: DC
  Administered 2021-08-09: 250 mL via INTRAVENOUS

## 2021-08-09 MED ORDER — CELECOXIB 200 MG PO CAPS
200.0000 mg | ORAL_CAPSULE | Freq: Two times a day (BID) | ORAL | Status: DC
  Administered 2021-08-09 – 2021-08-10 (×3): 200 mg via ORAL
  Filled 2021-08-09 (×3): qty 1

## 2021-08-09 MED ORDER — ROCURONIUM BROMIDE 10 MG/ML (PF) SYRINGE
PREFILLED_SYRINGE | INTRAVENOUS | Status: DC | PRN
  Administered 2021-08-09: 20 mg via INTRAVENOUS
  Administered 2021-08-09: 70 mg via INTRAVENOUS
  Administered 2021-08-09 (×2): 10 mg via INTRAVENOUS

## 2021-08-09 MED ORDER — LIDOCAINE 2% (20 MG/ML) 5 ML SYRINGE
INTRAMUSCULAR | Status: AC
  Filled 2021-08-09: qty 5

## 2021-08-09 MED ORDER — SUGAMMADEX SODIUM 200 MG/2ML IV SOLN
INTRAVENOUS | Status: DC | PRN
  Administered 2021-08-09: 200 mg via INTRAVENOUS

## 2021-08-09 MED ORDER — THROMBIN 20000 UNITS EX SOLR
CUTANEOUS | Status: AC
  Filled 2021-08-09: qty 20000

## 2021-08-09 MED ORDER — BUPIVACAINE HCL (PF) 0.25 % IJ SOLN
INTRAMUSCULAR | Status: AC
  Filled 2021-08-09: qty 30

## 2021-08-09 MED ORDER — ACETAMINOPHEN 500 MG PO TABS
ORAL_TABLET | ORAL | Status: AC
  Administered 2021-08-09: 1000 mg via ORAL
  Filled 2021-08-09: qty 2

## 2021-08-09 MED ORDER — ONDANSETRON HCL 4 MG PO TABS: 4.0000 mg | ORAL_TABLET | Freq: Four times a day (QID) | ORAL | Status: DC | PRN

## 2021-08-09 MED ORDER — MEPERIDINE HCL 25 MG/ML IJ SOLN: 6.2500 mg | INTRAMUSCULAR | Status: DC | PRN

## 2021-08-09 MED ORDER — INSULIN ASPART 100 UNIT/ML IJ SOLN: 0.0000 [IU] | INTRAMUSCULAR | Status: DC | PRN

## 2021-08-09 MED ORDER — OXYCODONE HCL 5 MG PO TABS
5.0000 mg | ORAL_TABLET | ORAL | Status: DC | PRN
  Administered 2021-08-09 – 2021-08-10 (×2): 5 mg via ORAL
  Filled 2021-08-09 (×2): qty 1

## 2021-08-09 MED ORDER — GLYCOPYRROLATE PF 0.2 MG/ML IJ SOSY
PREFILLED_SYRINGE | INTRAMUSCULAR | Status: AC
  Filled 2021-08-09: qty 1

## 2021-08-09 MED ORDER — FENTANYL CITRATE (PF) 100 MCG/2ML IJ SOLN
INTRAMUSCULAR | Status: AC
  Filled 2021-08-09: qty 2

## 2021-08-09 MED ORDER — MORPHINE SULFATE (PF) 2 MG/ML IV SOLN: 2.0000 mg | INTRAVENOUS | Status: DC | PRN

## 2021-08-09 MED ORDER — TAMSULOSIN HCL 0.4 MG PO CAPS
0.4000 mg | ORAL_CAPSULE | Freq: Every day | ORAL | Status: DC | PRN
  Administered 2021-08-09 – 2021-08-10 (×2): 0.4 mg via ORAL
  Filled 2021-08-09 (×2): qty 1

## 2021-08-09 MED ORDER — LOSARTAN POTASSIUM 50 MG PO TABS: 100.0000 mg | ORAL_TABLET | Freq: Every morning | ORAL | Status: DC

## 2021-08-09 MED ORDER — KETOROLAC TROMETHAMINE 15 MG/ML IJ SOLN
INTRAMUSCULAR | Status: AC
  Filled 2021-08-09: qty 1

## 2021-08-09 MED ORDER — PROPOFOL 10 MG/ML IV BOLUS
INTRAVENOUS | Status: DC | PRN
  Administered 2021-08-09: 200 mg via INTRAVENOUS

## 2021-08-09 MED ORDER — FENTANYL CITRATE (PF) 250 MCG/5ML IJ SOLN
INTRAMUSCULAR | Status: AC
  Filled 2021-08-09: qty 5

## 2021-08-09 MED ORDER — CHLORHEXIDINE GLUCONATE 0.12 % MT SOLN: 15.0000 mL | Freq: Once | OROMUCOSAL | Status: AC

## 2021-08-09 MED ORDER — ACETAMINOPHEN 500 MG PO TABS: 1000.0000 mg | ORAL_TABLET | ORAL | Status: AC

## 2021-08-09 MED ORDER — MENTHOL 3 MG MT LOZG: 1.0000 | LOZENGE | OROMUCOSAL | Status: DC | PRN

## 2021-08-09 MED ORDER — CEFAZOLIN SODIUM-DEXTROSE 2-4 GM/100ML-% IV SOLN
2.0000 g | Freq: Three times a day (TID) | INTRAVENOUS | Status: AC
  Administered 2021-08-09 – 2021-08-10 (×2): 2 g via INTRAVENOUS
  Filled 2021-08-09 (×2): qty 100

## 2021-08-09 MED ORDER — SODIUM CHLORIDE 0.9% FLUSH: 3.0000 mL | INTRAVENOUS | Status: DC | PRN

## 2021-08-09 MED ORDER — ONDANSETRON HCL 4 MG/2ML IJ SOLN
INTRAMUSCULAR | Status: AC
  Filled 2021-08-09: qty 2

## 2021-08-09 MED ORDER — CEFAZOLIN SODIUM-DEXTROSE 2-4 GM/100ML-% IV SOLN: 2.0000 g | INTRAVENOUS | Status: DC

## 2021-08-09 MED ORDER — LACTATED RINGERS IV SOLN: INTRAVENOUS | Status: DC

## 2021-08-09 MED ORDER — PROPOFOL 10 MG/ML IV BOLUS
INTRAVENOUS | Status: AC
  Filled 2021-08-09: qty 20

## 2021-08-09 MED ORDER — THROMBIN 20000 UNITS EX SOLR: OROMUCOSAL | Status: DC | PRN

## 2021-08-09 MED ORDER — FENTANYL CITRATE (PF) 100 MCG/2ML IJ SOLN
25.0000 ug | INTRAMUSCULAR | Status: DC | PRN
  Administered 2021-08-09: 25 ug via INTRAVENOUS

## 2021-08-09 MED ORDER — ACETAMINOPHEN 500 MG PO TABS
1000.0000 mg | ORAL_TABLET | Freq: Four times a day (QID) | ORAL | Status: AC
  Administered 2021-08-09 – 2021-08-10 (×4): 1000 mg via ORAL
  Filled 2021-08-09 (×4): qty 2

## 2021-08-09 MED ORDER — METHOCARBAMOL 1000 MG/10ML IJ SOLN
500.0000 mg | Freq: Four times a day (QID) | INTRAVENOUS | Status: DC | PRN
  Filled 2021-08-09: qty 5

## 2021-08-09 SURGICAL SUPPLY — 47 items
BAG COUNTER SPONGE SURGICOUNT (BAG) ×2 IMPLANT
BAND RUBBER #18 3X1/16 STRL (MISCELLANEOUS) ×2 IMPLANT
BENZOIN TINCTURE PRP APPL 2/3 (GAUZE/BANDAGES/DRESSINGS) ×2 IMPLANT
BUR CARBIDE MATCH 3.0 (BURR) ×2 IMPLANT
CANISTER SUCT 3000ML PPV (MISCELLANEOUS) ×2 IMPLANT
DERMABOND ADVANCED (GAUZE/BANDAGES/DRESSINGS) ×1
DERMABOND ADVANCED .7 DNX12 (GAUZE/BANDAGES/DRESSINGS) IMPLANT
DRAPE LAPAROTOMY 100X72X124 (DRAPES) ×2 IMPLANT
DRAPE MICROSCOPE LEICA (MISCELLANEOUS) ×2 IMPLANT
DRAPE SURG 17X23 STRL (DRAPES) ×1 IMPLANT
DRSG OPSITE 4X5.5 SM (GAUZE/BANDAGES/DRESSINGS) ×1 IMPLANT
DRSG OPSITE POSTOP 4X6 (GAUZE/BANDAGES/DRESSINGS) ×1 IMPLANT
DRSG OPSITE POSTOP 4X8 (GAUZE/BANDAGES/DRESSINGS) IMPLANT
DURAPREP 26ML APPLICATOR (WOUND CARE) ×2 IMPLANT
ELECT REM PT RETURN 9FT ADLT (ELECTROSURGICAL) ×2
ELECTRODE REM PT RTRN 9FT ADLT (ELECTROSURGICAL) ×1 IMPLANT
EVACUATOR 1/8 PVC DRAIN (DRAIN) ×1 IMPLANT
GAUZE 4X4 16PLY ~~LOC~~+RFID DBL (SPONGE) ×1 IMPLANT
GAUZE SPONGE 4X4 12PLY STRL (GAUZE/BANDAGES/DRESSINGS) ×1 IMPLANT
GLOVE SURG ENC MOIS LTX SZ7 (GLOVE) ×1 IMPLANT
GLOVE SURG ENC MOIS LTX SZ8 (GLOVE) ×2 IMPLANT
GLOVE SURG UNDER POLY LF SZ7 (GLOVE) ×1 IMPLANT
GOWN STRL REUS W/ TWL LRG LVL3 (GOWN DISPOSABLE) IMPLANT
GOWN STRL REUS W/ TWL XL LVL3 (GOWN DISPOSABLE) ×1 IMPLANT
GOWN STRL REUS W/TWL 2XL LVL3 (GOWN DISPOSABLE) IMPLANT
GOWN STRL REUS W/TWL LRG LVL3 (GOWN DISPOSABLE) ×1
GOWN STRL REUS W/TWL XL LVL3 (GOWN DISPOSABLE) ×2
HEMOSTAT POWDER KIT SURGIFOAM (HEMOSTASIS) ×2 IMPLANT
KIT BASIN OR (CUSTOM PROCEDURE TRAY) ×2 IMPLANT
KIT TURNOVER KIT B (KITS) ×2 IMPLANT
MILL MEDIUM DISP (BLADE) ×1 IMPLANT
NDL HYPO 25X1 1.5 SAFETY (NEEDLE) ×1 IMPLANT
NDL SPNL 20GX3.5 QUINCKE YW (NEEDLE) IMPLANT
NEEDLE HYPO 25X1 1.5 SAFETY (NEEDLE) ×2 IMPLANT
NEEDLE SPNL 20GX3.5 QUINCKE YW (NEEDLE) ×2 IMPLANT
NS IRRIG 1000ML POUR BTL (IV SOLUTION) ×2 IMPLANT
PACK LAMINECTOMY NEURO (CUSTOM PROCEDURE TRAY) ×2 IMPLANT
PAD ARMBOARD 7.5X6 YLW CONV (MISCELLANEOUS) ×6 IMPLANT
SPONGE SURGIFOAM ABS GEL 100 (HEMOSTASIS) ×1 IMPLANT
STRIP CLOSURE SKIN 1/2X4 (GAUZE/BANDAGES/DRESSINGS) ×2 IMPLANT
SUT VIC AB 0 CT1 18XCR BRD8 (SUTURE) ×1 IMPLANT
SUT VIC AB 0 CT1 8-18 (SUTURE) ×1
SUT VIC AB 2-0 CP2 18 (SUTURE) ×2 IMPLANT
SUT VIC AB 3-0 SH 8-18 (SUTURE) ×3 IMPLANT
TOWEL GREEN STERILE (TOWEL DISPOSABLE) ×2 IMPLANT
TOWEL GREEN STERILE FF (TOWEL DISPOSABLE) ×2 IMPLANT
WATER STERILE IRR 1000ML POUR (IV SOLUTION) ×2 IMPLANT

## 2021-08-09 NOTE — H&P (Signed)
Subjective: ?Patient is a 81 y.o. male admitted for lumbar stenosis. Onset of symptoms was several months ago, gradually worsening since that time.  The pain is rated severe, and is located at the across the lower back and radiates to L>R hip and leg. The pain is described as aching and occurs intermittently. The symptoms have been progressive. Symptoms are exacerbated by exercise, standing, and walking for more than a few minutes. MRI or CT showed severe stenosis  ? ?Past Medical History:  ?Diagnosis Date  ? Aortic regurgitation 07/14/2020  ? Aortic stenosis, moderate 03/17/2019  ? 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 Benign prostatic hyperplasia without lower urinary tract symptoms 07/12/2020  ? Cardiac murmur 12/05/2018  ? Diabetes mellitus due to underlying condition with unspecified complications ([employer]) 14/48/1856  ? Essential hypertension 12/05/2018  ? 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 History of pancreatitis 07/12/2020  ? History of pancytopenia 07/12/2020  ? Hyperlipidemia, unspecified 07/12/2020  ? Irregular heart beat 07/12/2020  ? Long term (current) use of insulin ([place]) 07/12/2020  ? 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 Lumbar radiculopathy 06/17/2020  ? Lumbar spondylosis 07/28/2020  ? Malignant neoplasm of prostate ([place]) 06/24/2019  ? Mood disorder ([place]) 07/12/2020  ? Pain of left hip joint 08/25/2019  ? PONV (postoperative nausea and vomiting)   ? Prostate cancer ([place])   ? 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 Systolic murmur 31/49/7026  ? Trochanteric bursitis of left hip 03/12/2021  ? Trochanteric bursitis of right hip 03/12/2021  ? Type 2 diabetes mellitus with other specified complication ([place]) 37/85/8850  ?  ?Past Surgical History:  ?Procedure Laterality Date  ? BACK SURGERY    ? lumbar surgery 20 years ago per patient  ? CHOLECYSTECTOMY    ? 20 years ago per patient  ? TONSILLECTOMY    ? as a kid  ?  ?Prior to Admission medications   ?Medication Sig Start Date End Date Taking?  Authorizing Provider  ?acetaminophen (TYLENOL) 500 MG tablet Take 1,000 mg by mouth daily as needed for headache.   Yes [provider]  ?atorvastatin (LIPITOR) 20 MG tablet Take 20 mg by mouth every morning. 11/18/18  Yes [provider]  ?CALCIUM PO Take 1 tablet by mouth every morning.   Yes [provider]  ?diazepam (VALIUM) 5 MG tablet Take 5 mg by mouth daily as needed for anxiety (moods). 02/06/21  Yes [provider]  ?ferrous sulfate 325 (65 FE) MG tablet Take 325 mg by mouth in the morning, at noon, and at bedtime. 03/27/21  Yes Ladell Pier, MD  ?insulin glargine (LANTUS SOLOSTAR) 100 UNIT/ML Solostar Pen Inject 32 Units into the skin daily before breakfast.   Yes [provider]  ?Astrid Drafts 500 MG CAPS Take 1,000 mg by mouth every morning.   Yes [provider]  ?losartan (COZAAR) 100 MG tablet Take 100 mg by mouth daily. 01/29/21  Yes [provider]  ?losartan (COZAAR) 100 MG tablet Take 100 mg by mouth every morning.   Yes [provider]  ?metFORMIN (GLUCOPHAGE) 500 MG tablet Take 1,000 mg by mouth 2 (two) times a day. 11/18/18  Yes [provider]  ?Multiple Vitamin (MULTIVITAMIN WITH MINERALS) TABS tablet Take 2 tablets by mouth every morning.   Yes [provider]  ?pantoprazole (PROTONIX) 40 MG tablet Take 40 mg by mouth daily as needed (acid reflux). 03/15/21  Yes [provider]  ?tamsulosin (FLOMAX) 0.4 MG CAPS capsule Take 0.4 mg by mouth  daily as needed (infrequent urination). 11/05/20  Yes [provider]  ?triamcinolone cream (KENALOG) 0.1 % Apply 1 application. topically 2 (two) times daily as needed (rash). 02/06/21  Yes [provider]  ? ?Allergies  ?Allergen Reactions  ? Lisinopril Cough  ?  ?Social History  ? ?Tobacco Use  ? Smoking status: Never  ? Smokeless tobacco: Never  ?Substance Use Topics  ? Alcohol use: Not Currently  ?  ?Family History  ?Problem Relation Age of  Onset  ? Lupus Mother   ? ?  ?Review of Systems ? ?Positive ROS: neg ? ?All other systems have been reviewed and were otherwise negative with the exception of those mentioned in the HPI and as above. ? ?Objective: ?Vital signs in last 24 hours: ?Temp:  [98.5 ?F (36.9 ?C)] 98.5 ?F (36.9 ?C) (03/22 1607) ?Pulse Rate:  [102] 102 (03/22 3710) ?Resp:  [17] 17 (03/22 6269) ?BP: (169)/(87) 169/87 (03/22 4854) ?SpO2:  [96 %] 96 % (03/22 0605) ?Weight:  [79.8 kg] 79.8 kg (03/22 0605) ? ?General Appearance: Alert, cooperative, no distress, appears stated age ?Head: Normocephalic, without obvious abnormality, atraumatic ?Eyes: PERRL, conjunctiva/corneas clear, EOM's intact    ?Neck: Supple, symmetrical, trachea midline ?Back: Symmetric, no curvature, ROM normal, no CVA tenderness ?Lungs:  respirations unlabored ?Heart: Regular rate and rhythm ?Abdomen: Soft, non-tender ?Extremities: Extremities normal, atraumatic, no cyanosis or edema ?Pulses: 2+ and symmetric all extremities ?Skin: Skin color, texture, turgor normal, no rashes or lesions ? ?NEUROLOGIC:  ? ?Mental status: Alert and oriented x4,  no aphasia, good attention span, fund of knowledge, and memory ?Motor Exam - grossly normal ?Sensory Exam - grossly normal ?Reflexes: 1+ ?Coordination - grossly normal ?Gait - grossly normal ?Balance - grossly normal ?Cranial Nerves: ?I: smell Not tested  ?II: visual acuity  OS: nl    OD: nl  ?II: visual fields Full to confrontation  ?II: pupils Equal, round, reactive to light  ?III,VII: ptosis None  ?III,IV,VI: extraocular muscles  Full ROM  ?V: mastication Normal  ?V: facial light touch sensation  Normal  ?V,VII: corneal reflex  Present  ?VII: facial muscle function - upper  Normal  ?VII: facial muscle function - lower Normal  ?VIII: hearing Not tested  ?IX: soft palate elevation  Normal  ?IX,X: gag reflex Present  ?XI: trapezius strength  5/5  ?XI: sternocleidomastoid strength 5/5  ?XI: neck flexion strength  5/5  ?XII: tongue  strength  Normal  ? ? ?Data Review ?Lab Results  ?Component Value Date  ? WBC 3.7 (L) 07/31/2021  ? HGB 11.7 (L) 07/31/2021  ? HCT 35.5 (L) 07/31/2021  ? MCV 92.4 07/31/2021  ? PLT 180 07/31/2021  ? ?Lab Results  ?Component Value Date  ? NA 136 08/02/2021  ? K 3.7 08/02/2021  ? CL 100 08/02/2021  ? CO2 28 08/02/2021  ? BUN 13 08/02/2021  ? CREATININE 0.82 08/02/2021  ? GLUCOSE 276 (H) 08/02/2021  ? ?Lab Results  ?Component Value Date  ? INR 1.1 08/02/2021  ? ? ?Assessment/Plan: ? ?Estimated body mass index is 25.25 kg/m? as calculated from the following: ?  Height as of this encounter: '5\' 10"'$  (1.778 m). ?  Weight as of this encounter: 79.8 kg. ?Patient admitted for laminectomy L2-3 L3-4 L4-5 L5-S1. Patient has failed a reasonable attempt at conservative therapy. ? ?I explained the condition and procedure to the patient and answered any questions.  Patient wishes to proceed with procedure as planned. Understands risks/ benefits and typical outcomes of procedure. ? ? ?  Eustace Moore ?08/09/2021 7:31 AM ? ?

## 2021-08-09 NOTE — Anesthesia Procedure Notes (Signed)
Procedure Name: Intubation ?Date/Time: 08/09/2021 7:59 AM ?Performed by: Janace Litten, CRNA ?Pre-anesthesia Checklist: Patient identified, Emergency Drugs available, Suction available and Patient being monitored ?Patient Re-evaluated:Patient Re-evaluated prior to induction ?Oxygen Delivery Method: Circle System Utilized ?Preoxygenation: Pre-oxygenation with 100% oxygen ?Induction Type: IV induction ?Ventilation: Mask ventilation without difficulty ?Laryngoscope Size: Mac and 4 ?Grade View: Grade I ?Tube type: Oral ?Tube size: 7.5 mm ?Number of attempts: 1 ?Airway Equipment and Method: Stylet and Oral airway ?Placement Confirmation: ETT inserted through vocal cords under direct vision, positive ETCO2 and breath sounds checked- equal and bilateral ?Secured at: 22 cm ?Tube secured with: Tape ?Dental Injury: Teeth and Oropharynx as per pre-operative assessment  ?Comments: Intubated by Everlene Other SRNA ? ? ? ? ?

## 2021-08-09 NOTE — Op Note (Signed)
08/09/2021 ? ?10:39 AM ? ?PATIENT:  Benjamin Davila  81 y.o. male ? 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 ?POST-OPERATIVE DIAGNOSIS:  same, with suspected instability at L3-4 ? ?PROCEDURE: Decompressive lumbar laminectomy with bilateral medial facetectomy and foraminotomies L2-3 L3-4 L4-5, right L3-4 intertransverse arthrodesis utilizing locally harvested morselized autologous bone graft ? ?SURGEON:  Sherley Bounds, MD ? ?ASSISTANTS: Glenford Peers FNP ? ?ANESTHESIA:   General ? ?EBL: 250 ml ? ?Total I/O ?In: 1000 [I.V.:1000] ?Out: 250 [Blood:250] ? ?BLOOD ADMINISTERED: none ? ?DRAINS: Medium Hemovac ? ?SPECIMEN:  none ? ?INDICATION FOR PROCEDURE: This patient presented with back pain with leg pain. 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 The patient tried conservative measures without relief. Pain was debilitating. Recommended decompressive laminectomy L2-3 L3-4 L4-5. Patient understood the risks, benefits, and alternatives and potential outcomes and wished to proceed. ? ?At surgery we felt there may be some segmental instability at L3-4 especially given his spondylolisthesis and his L5 compression fracture, and with a full laminectomy medial facetectomy foraminotomy we felt we may add to this instability.  Therefore we decided to perform a intertransverse arthrodesis at L3-4 on the right without instrumentation in the hopes of avoidance of further surgery in the future for instability at this level. ? ?PROCEDURE DETAILS: The patient was taken to the operating room and after induction of adequate generalized endotracheal anesthesia, the patient was rolled into the prone position on the Wilson frame and all pressure points were padded. The lumbar region was cleaned and then prepped with DuraPrep and draped in the usual sterile fashion. 5 cc of local anesthesia was  injected and then a dorsal midline incision was made and carried down to the lumbo sacral fascia. The fascia was opened and the paraspinous musculature was taken down in a subperiosteal fashion to expose L2-3 L3-4 and L4-5. Intraoperative x-ray confirmed my level at L4-5 and all in the room agreed, and we then pulled on the spinous process to look for ligamentous laxity and abnormal movement.  We felt like there may be some laxity at L3-4.  Given the preoperative spondylolisthesis at this level, his bone quality, and the L5 compression fracture, we felt that a noninstrumented posterior lateral fusion was reasonable.  For we decided to save all local bone during the decompression.  Then I removed the spinous process of L4 and L3 and L2 used a combination of the high-speed drill and the Kerrison punches to perform a complete laminectomy, medial facetectomy, and foraminotomy at L2-3 L3-4 and L4-5 for spinal stenosis decompression. The underlying yellow ligament was opened and removed in a piecemeal fashion to expose the underlying dura and exiting nerve root at each level bilaterally. I undercut the lateral recess and dissected down until I was medial to and distal to the pedicle of L3, L4, and L5.  We drilled the top of the L5 lamina and follow this until we were distal to the pedicle to fully decompress the L5 nerve root within the foramen.  The nerve root at each level bilaterally was well decompressed.  I then palpated with a coronary dilator along the nerve root and into the foramen to assure adequate decompression. I felt no more compression of the nerve root at any level or on either side. I irrigated with saline solution.  We dissected out over the transverse processes of L3 and L4 on the right.  We decorticated these  with a high-speed drill and then placed our locally harvested morselized autologous bone graft over L3 and L4 transverse processes to hopefully achieve arthrodesis at L3-4 on the right.  Achieved  hemostasis with bipolar cautery and Surgifoam, lined the dura with Gelfoam, placed a medium Hemovac drain through a separate stab incision, and then closed the fascia with 0 Vicryl. I closed the subcutaneous tissues with 2-0 Vicryl and the subcuticular tissues with 3-0 Vicryl. The skin was then closed with benzoin and Steri-Strips. The drapes were removed, a sterile dressing was applied.  My nurse practitioner was involved in the exposure, safe retraction of the neural elements, the decompression, fusion, and the closure. the patient was awakened from general anesthesia and transferred to the recovery room in stable condition. At the end of the procedure all sponge, needle and instrument counts were correct. ? ? ? ?PLAN OF CARE: Admit for overnight observation ? ?PATIENT DISPOSITION:  PACU - hemodynamically stable. ?  ?Delay start of Pharmacological VTE agent (>24hrs) due to surgical blood loss or risk of bleeding:  yes ? ? ? ? ? ? ? ? ? ? ? ? ? ? ?

## 2021-08-09 NOTE — Anesthesia Postprocedure Evaluation (Signed)
Anesthesia Post Note ? ?Patient: Benjamin Davila ? ?Procedure(s) Performed: Lumbar decompressive laminectomy  - Lumbar two-Lumabr three - Lumbar three-Lumbar four - Lumbar four-Lumbar five - Lumabr five-Sacal one - Bilateral, Posterior Lateral Fusion Lumbar three-four-Right (Bilateral: Spine Lumbar) ? ?  ? ?Patient location during evaluation: PACU ?Anesthesia Type: General ?Level of consciousness: awake and sedated ?Pain management: pain level controlled ?Vital Signs Assessment: post-procedure vital signs reviewed and stable ?Respiratory status: spontaneous breathing ?Cardiovascular status: stable ?Postop Assessment: no apparent nausea or vomiting ?Anesthetic complications: no ? ? ?No notable events documented. ? ?Last Vitals:  ?Vitals:  ? 08/09/21 1102 08/09/21 1117  ?BP: 105/62 96/65  ?Pulse: 74 71  ?Resp: 18 13  ?Temp:    ?SpO2: 98% 97%  ?  ?Last Pain:  ?Vitals:  ? 08/09/21 1117  ?TempSrc:   ?PainSc: 7   ? ? ?  ?  ?  ?  ?  ?  ? ?Huston Foley ? ? ? ? ?

## 2021-08-09 NOTE — Transfer of Care (Signed)
Immediate Anesthesia Transfer of Care Note ? ?Patient: Benjamin Davila ? ?Procedure(s) Performed: Lumbar decompressive laminectomy  - Lumbar two-Lumabr three - Lumbar three-Lumbar four - Lumbar four-Lumbar five - Lumabr five-Sacal one - Bilateral (Bilateral: Spine Lumbar) ? ?Patient Location: PACU ? ?Anesthesia Type:General ? ?Level of Consciousness: awake, alert  and oriented ? ?Airway & Oxygen Therapy: Patient Spontanous Breathing and Patient connected to face mask oxygen ? ?Post-op Assessment: Report given to RN, Post -op Vital signs reviewed and stable and Patient moving all extremities X 4 ? ?Post vital signs: Reviewed and stable ? ?Last Vitals:  ?Vitals Value Taken Time  ?BP 140/69 08/09/21 1047  ?Temp    ?Pulse 78 08/09/21 1049  ?Resp 25 08/09/21 1049  ?SpO2 100 % 08/09/21 1049  ?Vitals shown include unvalidated device data. ? ?Last Pain:  ?Vitals:  ? 08/09/21 0605  ?TempSrc: Oral  ?   ? ?  ? ?Complications: No notable events documented. ?

## 2021-08-10 ENCOUNTER — Encounter (HOSPITAL_COMMUNITY): Payer: Self-pay | Admitting: Neurological Surgery

## 2021-08-10 DIAGNOSIS — M48061 Spinal stenosis, lumbar region without neurogenic claudication: Secondary | ICD-10-CM | POA: Diagnosis not present

## 2021-08-10 LAB — GLUCOSE, CAPILLARY: Glucose-Capillary: 135 mg/dL — ABNORMAL HIGH (ref 70–99)

## 2021-08-10 MED ORDER — METHOCARBAMOL 500 MG PO TABS: 500.0000 mg | ORAL_TABLET | Freq: Four times a day (QID) | ORAL | 0 refills | Status: DC

## 2021-08-10 MED ORDER — HYDROCODONE-ACETAMINOPHEN 5-325 MG PO TABS: 1.0000 | ORAL_TABLET | ORAL | 0 refills | Status: DC | PRN

## 2021-08-10 NOTE — Evaluation (Signed)
Occupational Therapy Evaluation ?Patient Details ?Name: Benjamin Davila ?MRN: [id number] ?DOB: [birth year] ?Today's Date: 08/10/2021 ? ? ?History of Present Illness 81 y.o. M admitted on 08/09/21 for lumbar laminectomy of L2-L5 and posterior lateral fusion of L3-4. PMH significant for DM2, HTN, and prostate cancer.  ? ?Clinical Impression ?  ?Pt admitted for concerns listed above. PTA pt reported that he was independent with all ADL's and IADL's, including driving. At this time, pt presents with mild pain, however he is able to complete all BADL's following compensatory strategies. Pt was educated on spinal precautions and compensatory techniques to assist with following them. He has no further OT needs and acute OT will sign off.   ?   ? ?Recommendations for follow up therapy are one component of a multi-disciplinary discharge planning process, led by the attending physician.  Recommendations may be updated based on patient status, additional functional criteria and insurance authorization.  ? ?Follow Up Recommendations ? No OT follow up  ?  ?Assistance Recommended at Discharge None  ?Patient can return home with the following   ? ?  ?Functional Status Assessment ? Patient has had a recent decline in their functional status and demonstrates the ability to make significant improvements in function in a reasonable and predictable amount of time.  ?Equipment Recommendations ? None recommended by OT  ?  ?Recommendations for Other Services   ? ? ?  ?Precautions / Restrictions Precautions ?Precautions: Back ?Precaution Booklet Issued: Yes (comment) ?Precaution Comments: Reviewed precautions and compensatory strategies. ?Required Braces or Orthoses:  (No brace needed order) ?Restrictions ?Weight Bearing Restrictions: No  ? ?  ? ?Mobility Bed Mobility ?Overal bed mobility: Modified Independent ?  ?  ?  ?  ?  ?  ?General bed mobility comments: Educated on log roll technique, able to follow through with no difficulties. ?   ? ?Transfers ?Overall transfer level: Independent ?Equipment used: None ?  ?  ?  ?  ?  ?  ?  ?  ?  ? ?  ?Balance Overall balance assessment: No apparent balance deficits (not formally assessed) ?  ?  ?  ?  ?  ?  ?  ?  ?  ?  ?  ?  ?  ?  ?  ?  ?  ?  ?   ? ?ADL either performed or assessed with clinical judgement  ? ?ADL Overall ADL's : Modified independent ?  ?  ?  ?  ?  ?  ?  ?  ?  ?  ?  ?  ?  ?  ?  ?  ?  ?  ?  ?General ADL Comments: Pt able to complete all ADL's with no assist - does require verbal cuing to follow spinal precautions with BADL's.  ? ? ? ?Vision Baseline Vision/History: 0 No visual deficits ?Ability to See in Adequate Light: 0 Adequate ?Patient Visual Report: No change from baseline ?Vision Assessment?: No apparent visual deficits  ?   ?Perception   ?  ?Praxis   ?  ? ?Pertinent Vitals/Pain Pain Assessment ?Pain Assessment: Faces ?Faces Pain Scale: Hurts little more ?Pain Location: Incision site ?Pain Descriptors / Indicators: Discomfort, Grimacing, Sore ?Pain Intervention(s): Monitored during session, Repositioned  ? ? ? ?Hand Dominance Right ?  ?Extremity/Trunk Assessment Upper Extremity Assessment ?Upper Extremity Assessment: Overall WFL for tasks assessed ?  ?Lower Extremity Assessment ?Lower Extremity Assessment: Defer to PT evaluation ?  ?Cervical / Trunk Assessment ?Cervical / Trunk Assessment: Back Surgery ?  ?  Communication Communication ?Communication: No difficulties ?  ?Cognition Arousal/Alertness: Awake/alert ?Behavior During Therapy: [facility] for tasks assessed/performed ?Overall Cognitive Status: Within Functional Limits for tasks assessed ?  ?  ?  ?  ?  ?  ?  ?  ?  ?  ?  ?  ?  ?  ?  ?  ?  ?  ?  ?General Comments  VSS on RA, honeycomb dressing intact and appears dry ? ?  ?Exercises   ?  ?Shoulder Instructions    ? ? ?Home Living Family/patient expects to be discharged to:: Private residence ?Living Arrangements: Spouse/significant other ?Available Help at Discharge: Family ?Type of Home:  House ?Home Access: Stairs to enter ?Entrance Stairs-Number of Steps: 4 steps ?Entrance Stairs-Rails: Left ?Home Layout: Two level;Able to live on main level with bedroom/bathroom ?Alternate Level Stairs-Number of Steps: 8 stairs ?Alternate Level Stairs-Rails: Right ?Bathroom Shower/Tub: Walk-in shower ?  ?Bathroom Toilet: Standard ?Bathroom Accessibility: No ?  ?Home Equipment: Conservation officer, nature (2 wheels);Shower seat - built in ?  ?  ?  ? ?  ?Prior Functioning/Environment Prior Level of Function : Independent/Modified Independent;Driving ?  ?  ?  ?  ?  ?  ?Mobility Comments: Distance limited by pain but overall independent ?  ?  ? ?  ?  ?OT Problem List: Decreased strength;Impaired balance (sitting and/or standing);Decreased activity tolerance;Pain ?  ?   ?OT Treatment/Interventions:    ?  ?OT Goals(Current goals can be found in the care plan section) Acute Rehab OT Goals ?Patient Stated Goal: To go home ?OT Goal Formulation: All assessment and education complete, DC therapy ?Time For Goal Achievement: 08/10/21 ?Potential to Achieve Goals: Good  ?OT Frequency:   ?  ? ?Co-evaluation   ?  ?  ?  ?  ? ?  ?AM-PAC OT "6 Clicks" Daily Activity     ?Outcome Measure Help from another person eating meals?: None ?Help from another person taking care of personal grooming?: None ?Help from another person toileting, which includes using toliet, bedpan, or urinal?: None ?Help from another person bathing (including washing, rinsing, drying)?: None ?Help from another person to put on and taking off regular upper body clothing?: None ?Help from another person to put on and taking off regular lower body clothing?: None ?6 Click Score: 24 ?  ?End of Session Nurse Communication: Mobility status ? ?Activity Tolerance: Patient tolerated treatment well ?Patient left: in bed;with call bell/phone within reach ? ?OT Visit Diagnosis: Unsteadiness on feet (R26.81);Other abnormalities of gait and mobility (R26.89);Muscle weakness (generalized)  (M62.81)  ?              ?Time: 9147-8295 ?OT Time Calculation (min): 26 min ?Charges:  OT General Charges ?$OT Visit: 1 Visit ?OT Evaluation ?$OT Eval Moderate Complexity: 1 Mod ?OT Treatments ?$Self Care/Home Management : 8-22 mins ? ?Emmalia Heyboer H., OTR/L ?Acute Rehabilitation ? ?Aireal Slater Elane Yolanda Bonine ?08/10/2021, 10:28 AM ?

## 2021-08-10 NOTE — Evaluation (Signed)
Physical Therapy Evaluation and Discharge ?Patient Details ?Name: Benjamin Davila ?MRN: [id number] ?DOB: [birth year] ?Today's Date: 08/10/2021 ? ?History of Present Illness ? Pt is an 81 y/o male who presents s/p L2-L5 laminectomy/decompression on 08/09/2021. PMH significant for autoimmune hepatitis, prostate CA, DM, prior back surgery. ?  ?Clinical Impression ? Patient evaluated by Physical Therapy with no further acute PT needs identified. All education has been completed and the patient has no further questions. Pt was able to demonstrate transfers and ambulation with gross modified independence and no AD. Pt was educated on precautions, positioning recommendations, appropriate activity progression, and car transfer. See below for any follow-up Physical Therapy or equipment needs. PT is signing off. Thank you for this referral.    ?   ? ?Recommendations for follow up therapy are one component of a multi-disciplinary discharge planning process, led by the attending physician.  Recommendations may be updated based on patient status, additional functional criteria and insurance authorization. ? ?Follow Up Recommendations No PT follow up ? ?  ?Assistance Recommended at Discharge PRN  ?Patient can return home with the following ? Assistance with cooking/housework;Assist for transportation;Help with stairs or ramp for entrance ? ?  ?Equipment Recommendations None recommended by PT  ?Recommendations for Other Services ?    ?  ?Functional Status Assessment Patient has had a recent decline in their functional status and demonstrates the ability to make significant improvements in function in a reasonable and predictable amount of time.  ? ?  ?Precautions / Restrictions Precautions ?Precautions: Fall;Back ?Precaution Booklet Issued: Yes (comment) ?Precaution Comments: Reviewed handout and pt was cued for precautions during functional mobility. ?Required Braces or Orthoses:  (No brace needed order) ?Restrictions ?Weight Bearing  Restrictions: No  ? ?  ? ?Mobility ? Bed Mobility ?  ?  ?  ?  ?  ?  ?  ?General bed mobility comments: Pt was received standing in the room. Verbally reviewed log roll technique. ?  ? ?Transfers ?Overall transfer level: Modified independent ?Equipment used: None ?  ?  ?  ?  ?  ?  ?  ?  ?  ? ?Ambulation/Gait ?Ambulation/Gait assistance: Modified independent (Device/Increase time) ?Gait Distance (Feet): 500 Feet ?Assistive device: None ?Gait Pattern/deviations: Step-through pattern, Decreased stride length, Trunk flexed ?Gait velocity: Decreased ?Gait velocity interpretation: >2.62 ft/sec, indicative of community ambulatory ?  ?General Gait Details: Pt ambulating well without overt LOB. Pt reports tightening of the L hip but overall appears to be improved symptoms from PTA based on pt's report of PLOF. ? ?Stairs ?Stairs: Yes ?Stairs assistance: Supervision ?Stair Management: One rail Right, Step to pattern, Forwards ?Number of Stairs: 10 ?General stair comments: VC's for sequencing and general safety. No assist required. ? ?Wheelchair Mobility ?  ? ?Modified Rankin (Stroke Patients Only) ?  ? ?  ? ?Balance Overall balance assessment: Mild deficits observed, not formally tested ?  ?  ?  ?  ?  ?  ?  ?  ?  ?  ?  ?  ?  ?  ?  ?  ?  ?  ?   ? ? ? ?Pertinent Vitals/Pain Pain Assessment ?Pain Assessment: Faces ?Faces Pain Scale: Hurts a little bit ?Pain Location: Incision site, L hip ?Pain Descriptors / Indicators: Operative site guarding, Aching ?Pain Intervention(s): Limited activity within patient's tolerance, Monitored during session, Repositioned  ? ? ?Home Living Family/patient expects to be discharged to:: Private residence ?Living Arrangements: Spouse/significant other ?Available Help at Discharge: Family ?Type of Home: House ?Home  Access: Stairs to enter ?Entrance Stairs-Rails: Right ?Entrance Stairs-Number of Steps: 4 ?Alternate Level Stairs-Number of Steps: flight with landing in the middle ?Home Layout: Two  level ?Home Equipment: Conservation officer, nature (2 wheels);Shower seat - built in ?   ?  ?Prior Function Prior Level of Function : Independent/Modified Independent ?  ?  ?  ?  ?  ?  ?Mobility Comments: Distance limited by pain but overall independent ?  ?  ? ? ?Hand Dominance  ?   ? ?  ?Extremity/Trunk Assessment  ? Upper Extremity Assessment ?Upper Extremity Assessment: Defer to OT evaluation ?  ? ?Lower Extremity Assessment ?Lower Extremity Assessment: Overall WFL for tasks assessed (Very mild and consistent with pre-op diagnosis; still WFL) ?  ? ?Cervical / Trunk Assessment ?Cervical / Trunk Assessment: Back Surgery  ?Communication  ? Communication: No difficulties  ?Cognition Arousal/Alertness: Awake/alert ?Behavior During Therapy: [employer] for tasks assessed/performed ?Overall Cognitive Status: Within Functional Limits for tasks assessed ?  ?  ?  ?  ?  ?  ?  ?  ?  ?  ?  ?  ?  ?  ?  ?  ?  ?  ?  ? ?  ?General Comments   ? ?  ?Exercises    ? ?Assessment/Plan  ?  ?PT Assessment Patient does not need any further PT services  ?PT Problem List   ? ?   ?  ?PT Treatment Interventions     ? ?PT Goals (Current goals can be found in the Care Plan section)  ?Acute Rehab PT Goals ?Patient Stated Goal: Home today, be able to drive ?PT Goal Formulation: All assessment and education complete, DC therapy ?Time For Goal Achievement: 08/17/21 ?Potential to Achieve Goals: Good ? ?  ?Frequency   ?  ? ? ?Co-evaluation   ?  ?  ?  ?  ? ? ?  ?AM-PAC PT "6 Clicks" Mobility  ?Outcome Measure Help needed turning from your back to your side while in a flat bed without using bedrails?: None ?Help needed moving from lying on your back to sitting on the side of a flat bed without using bedrails?: None ?Help needed moving to and from a bed to a chair (including a wheelchair)?: None ?Help needed standing up from a chair using your arms (e.g., wheelchair or bedside chair)?: None ?Help needed to walk in hospital room?: None ?Help needed climbing 3-5 steps with a  railing? : A Little ?6 Click Score: 23 ? ?  ?End of Session   ?Activity Tolerance: Patient tolerated treatment well ?Patient left: with call bell/phone within reach (Standing in room) ?Nurse Communication: Mobility status ?PT Visit Diagnosis: Unsteadiness on feet (R26.81);Pain ?Pain - part of body:  (back) ?  ? ?Time: 1287-8676 ?PT Time Calculation (min) (ACUTE ONLY): 24 min ? ? ?Charges:   PT Evaluation ?$PT Eval Low Complexity: 1 Low ?PT Treatments ?$Gait Training: 8-22 mins ?  ?   ? ? ?Rolinda Roan, PT, DPT ?Acute Rehabilitation Services ?Pager: [phone] ?Office: [phone]  ? ?Thelma Comp ?08/10/2021, 10:24 AM ? ?

## 2021-08-10 NOTE — Discharge Summary (Signed)
Physician Discharge Summary  ?Patient ID: ?Benjamin Davila ?MRN: [id number] ?DOB/AGE: 1940/10/06 81 y.o. ? ?Admit date: 08/09/2021 ?Discharge date: 08/10/2021 ? 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 ? ?Discharge Diagnoses: same ? ? ?Discharged Condition: good ? ?Hospital Course: The patient was admitted on 08/09/2021 and taken to the operating room where the patient underwent lumbar laminectomy L2-L5 with posterior lateral fusion L3-4. The patient tolerated the procedure well and was taken to the recovery room and then to the floor in stable condition. The hospital course was routine. There were no complications. The wound remained clean dry and intact. Pt had appropriate back soreness. No complaints of leg pain or new N/T/W. The patient remained afebrile with stable vital signs, and tolerated a regular diet. The patient continued to increase activities, and pain was well controlled with oral pain medications.  ? ?Consults: None ? ?Significant Diagnostic Studies:  ?Results for orders placed or performed during the hospital encounter of 08/09/21  ?Glucose, capillary  ?Result Value Ref Range  ? Glucose-Capillary 117 (H) 70 - 99 mg/dL  ?Glucose, capillary  ?Result Value Ref Range  ? Glucose-Capillary 112 (H) 70 - 99 mg/dL  ?Glucose, capillary  ?Result Value Ref Range  ? Glucose-Capillary 109 (H) 70 - 99 mg/dL  ?Hemoglobin A1c  ?Result Value Ref Range  ? Hgb A1c MFr Bld 6.9 (H) 4.8 - 5.6 %  ? Mean Plasma Glucose 151.33 mg/dL  ?Glucose, capillary  ?Result Value Ref Range  ? Glucose-Capillary 230 (H) 70 - 99 mg/dL  ?Glucose, capillary  ?Result Value Ref Range  ? Glucose-Capillary 154 (H) 70 - 99 mg/dL  ? Comment 1 Notify RN   ? Comment 2 Document in Chart   ?Glucose, capillary  ?Result Value Ref Range  ? Glucose-Capillary 135 (H) 70 - 99 mg/dL  ? Comment 1 Notify RN   ? Comment 2 Document in Chart   ? ? ?DG Lumbar Spine 1 View ? ?Result  Date: 08/09/2021 ?CLINICAL DATA:  Lumbar laminectomy L2-3 L3-4 L4-5 L5-S1 EXAM: LUMBAR SPINE - 1 VIEW COMPARISON:  Lumbar radiographs 03/16/2021, 07/04/2021 FINDINGS: Five lumbar segments on prior AP lumbar spine. Moderate compression fracture of L5 is chronic Lateral image in the operating room was obtained for surgical localization. Surgical instrument posterior to the spinal canal at the L4-5 level. IMPRESSION: L4-5 level localized in the operating room. Electronically Signed   By: Franchot Gallo M.D.   On: 08/09/2021 11:27   ? ?Antibiotics:  ?Anti-infectives (From admission, onward)  ? ? Start     Dose/Rate Route Frequency Ordered Stop  ? 08/09/21 1800  ceFAZolin (ANCEF) IVPB 2g/100 mL premix       ? 2 g ?200 mL/hr over 30 Minutes Intravenous Every 8 hours 08/09/21 1130 08/10/21 0129  ? 08/09/21 0600  ceFAZolin (ANCEF) IVPB 2g/100 mL premix  Status:  Discontinued       ? 2 g ?200 mL/hr over 30 Minutes Intravenous On call to O.R. 08/09/21 0546 08/09/21 1145  ? 08/09/21 0600  vancomycin (VANCOCIN) IVPB 1000 mg/200 mL premix       ? 1,000 mg ?200 mL/hr over 60 Minutes Intravenous On call to O.R. 08/09/21 0546 08/09/21 0738  ? 08/09/21 0600  ceFAZolin (ANCEF) 2-4 GM/100ML-% IVPB       ?Note to Pharmacy: Humberto Leep O: cabinet override  ?    08/09/21 0600 08/09/21 1814  ? ?  ? ? ?Discharge Exam: ?Blood pressure 103/62,  pulse 66, temperature 97.7 ?F (36.5 ?C), temperature source Oral, resp. rate 16, height '5\' 10"'$  (1.778 m), weight 79.8 kg, SpO2 100 %. ?Neurologic: Grossly normal ?Ambulaitng and voiding well incision cid  ? ?Discharge Medications:   ?Allergies as of 08/10/2021   ? ?   Reactions  ? Lisinopril Cough  ? ?  ? ?  ?Medication List  ?  ? ?TAKE these medications   ? ?acetaminophen 500 MG tablet ?Commonly known as: TYLENOL ?Take 1,000 mg by mouth daily as needed for headache. ?  ?atorvastatin 20 MG tablet ?Commonly known as: LIPITOR ?Take 20 mg by mouth every morning. ?  ?CALCIUM PO ?Take 1 tablet by mouth  every morning. ?  ?diazepam 5 MG tablet ?Commonly known as: VALIUM ?Take 5 mg by mouth daily as needed for anxiety (moods). ?  ?ferrous sulfate 325 (65 FE) MG tablet ?Take 325 mg by mouth in the morning, at noon, and at bedtime. ?  ?HYDROcodone-acetaminophen 5-325 MG tablet ?Commonly known as: NORCO/VICODIN ?Take 1 tablet by mouth every 4 (four) hours as needed for moderate pain. ?  ?Krill Oil 500 MG Caps ?Take 1,000 mg by mouth every morning. ?  ?Lantus SoloStar 100 UNIT/ML Solostar Pen ?Generic drug: insulin glargine ?Inject 32 Units into the skin daily before breakfast. ?  ?losartan 100 MG tablet ?Commonly known as: COZAAR ?Take 100 mg by mouth daily. ?  ?metFORMIN 500 MG tablet ?Commonly known as: GLUCOPHAGE ?Take 1,000 mg by mouth 2 (two) times a day. ?  ?methocarbamol 500 MG tablet ?Commonly known as: Robaxin ?Take 1 tablet (500 mg total) by mouth 4 (four) times daily. ?  ?multivitamin with minerals Tabs tablet ?Take 2 tablets by mouth every morning. ?  ?pantoprazole 40 MG tablet ?Commonly known as: PROTONIX ?Take 40 mg by mouth daily as needed (acid reflux). ?  ?tamsulosin 0.4 MG Caps capsule ?Commonly known as: FLOMAX ?Take 0.4 mg by mouth daily as needed (infrequent urination). ?  ?triamcinolone cream 0.1 % ?Commonly known as: KENALOG ?Apply 1 application. topically 2 (two) times daily as needed (rash). ?  ? ?  ? ? ?Disposition: home  ? ?Final Dx: lumbar laminectomy L2-L5 with posterior lateral fusion L3-4 ? ?Discharge Instructions   ? ?  Remove dressing in 72 hours   Complete by: As directed ?  ? Call MD for:  difficulty breathing, headache or visual disturbances   Complete by: As directed ?  ? Call MD for:  hives   Complete by: As directed ?  ? Call MD for:  persistant dizziness or light-headedness   Complete by: As directed ?  ? Call MD for:  persistant nausea and vomiting   Complete by: As directed ?  ? Call MD for:  redness, tenderness, or signs of infection (pain, swelling, redness, odor or  green/yellow discharge around incision site)   Complete by: As directed ?  ? Call MD for:  severe uncontrolled pain   Complete by: As directed ?  ? Call MD for:  temperature >100.4   Complete by: As directed ?  ? Diet - low sodium heart healthy   Complete by: As directed ?  ? Driving Restrictions   Complete by: As directed ?  ? No driving for 1 week, no riding in the car for 1 week  ? Increase activity slowly   Complete by: As directed ?  ? Lifting restrictions   Complete by: As directed ?  ? No lifting more than 8 lbs  ? ?  ? ? ? ? ? ?  Signed: ?Ocie Cornfield Ellason Segar ?08/10/2021, 7:33 AM ?  ?

## 2021-08-10 NOTE — Discharge Instructions (Signed)
?  Call Your Doctor If Any of These Occur ?Redness, drainage, or swelling at the wound.  ?Temperature greater than 101 degrees. ?Severe pain not relieved by pain medication. ?Incision starts to come apart. ?Follow Up Appt ?Call [phone])  for problems.   ?

## 2021-08-10 NOTE — Progress Notes (Signed)
Patient alert and oriented, mae's well, voiding adequate amount of urine, swallowing without difficulty, no c/o pain at time of discharge. Patient discharged home with family. Script and discharged instructions given to patient. Patient and family stated understanding of instructions given. Patient has an appointment with Dr. Jones in 2 weeks ?

## 2021-12-12 ENCOUNTER — Other Ambulatory Visit: Payer: Self-pay | Admitting: Neurological Surgery

## 2021-12-12 DIAGNOSIS — M5416 Radiculopathy, lumbar region: Secondary | ICD-10-CM

## 2021-12-18 ENCOUNTER — Ambulatory Visit
Admission: RE | Admit: 2021-12-18 | Discharge: 2021-12-18 | Disposition: A | Discharge status: Home / Self Care | Payer: Medicare Other | Source: Ambulatory Visit | Attending: Neurological Surgery | Admitting: Neurological Surgery

## 2021-12-18 DIAGNOSIS — M5416 Radiculopathy, lumbar region: Secondary | ICD-10-CM

## 2021-12-18 MED ORDER — DIAZEPAM 5 MG PO TABS: 5.0000 mg | ORAL_TABLET | Freq: Once | ORAL | Status: DC

## 2021-12-18 MED ORDER — MEPERIDINE HCL 50 MG/ML IJ SOLN: 50.0000 mg | Freq: Once | INTRAMUSCULAR | Status: DC | PRN

## 2021-12-18 MED ORDER — ONDANSETRON HCL 4 MG/2ML IJ SOLN: 4.0000 mg | Freq: Once | INTRAMUSCULAR | Status: DC | PRN

## 2021-12-18 MED ORDER — IOPAMIDOL (ISOVUE-M 200) INJECTION 41%
15.0000 mL | Freq: Once | INTRAMUSCULAR | Status: AC
  Administered 2021-12-18: 15 mL via EPIDURAL

## 2021-12-18 NOTE — Discharge Instructions (Signed)

## 2021-12-29 ENCOUNTER — Inpatient Hospital Stay (HOSPITAL_BASED_OUTPATIENT_CLINIC_OR_DEPARTMENT_OTHER): Payer: Medicare Other | Admitting: Nurse Practitioner

## 2021-12-29 ENCOUNTER — Inpatient Hospital Stay: Payer: Medicare Other | Attending: Oncology

## 2021-12-29 ENCOUNTER — Encounter: Payer: Self-pay | Admitting: Nurse Practitioner

## 2021-12-29 VITALS — BP 130/75 | HR 80 | Temp 98.2°F | Resp 20 | Ht 70.0 in | Wt 182.0 lb

## 2021-12-29 DIAGNOSIS — Z79899 Other long term (current) drug therapy: Secondary | ICD-10-CM | POA: Insufficient documentation

## 2021-12-29 DIAGNOSIS — I350 Nonrheumatic aortic (valve) stenosis: Secondary | ICD-10-CM | POA: Insufficient documentation

## 2021-12-29 DIAGNOSIS — R5383 Other fatigue: Secondary | ICD-10-CM | POA: Diagnosis not present

## 2021-12-29 DIAGNOSIS — Z923 Personal history of irradiation: Secondary | ICD-10-CM | POA: Insufficient documentation

## 2021-12-29 DIAGNOSIS — I10 Essential (primary) hypertension: Secondary | ICD-10-CM | POA: Diagnosis not present

## 2021-12-29 DIAGNOSIS — D123 Benign neoplasm of transverse colon: Secondary | ICD-10-CM | POA: Diagnosis not present

## 2021-12-29 DIAGNOSIS — D509 Iron deficiency anemia, unspecified: Secondary | ICD-10-CM | POA: Insufficient documentation

## 2021-12-29 DIAGNOSIS — D649 Anemia, unspecified: Secondary | ICD-10-CM | POA: Diagnosis not present

## 2021-12-29 DIAGNOSIS — R0609 Other forms of dyspnea: Secondary | ICD-10-CM | POA: Insufficient documentation

## 2021-12-29 DIAGNOSIS — Z8719 Personal history of other diseases of the digestive system: Secondary | ICD-10-CM | POA: Diagnosis not present

## 2021-12-29 DIAGNOSIS — N400 Enlarged prostate without lower urinary tract symptoms: Secondary | ICD-10-CM | POA: Insufficient documentation

## 2021-12-29 DIAGNOSIS — C61 Malignant neoplasm of prostate: Secondary | ICD-10-CM | POA: Insufficient documentation

## 2021-12-29 DIAGNOSIS — E119 Type 2 diabetes mellitus without complications: Secondary | ICD-10-CM | POA: Diagnosis not present

## 2021-12-29 DIAGNOSIS — D122 Benign neoplasm of ascending colon: Secondary | ICD-10-CM | POA: Diagnosis not present

## 2021-12-29 LAB — CBC WITH DIFFERENTIAL (CANCER CENTER ONLY)
Abs Immature Granulocytes: 0.01 10*3/uL (ref 0.00–0.07)
Basophils Absolute: 0 10*3/uL (ref 0.0–0.1)
Basophils Relative: 1 %
Eosinophils Absolute: 0.2 10*3/uL (ref 0.0–0.5)
Eosinophils Relative: 4 %
HCT: 35.5 % — ABNORMAL LOW (ref 39.0–52.0)
Hemoglobin: 11.6 g/dL — ABNORMAL LOW (ref 13.0–17.0)
Immature Granulocytes: 0 %
Lymphocytes Relative: 24 %
Lymphs Abs: 1.1 10*3/uL (ref 0.7–4.0)
MCH: 31.6 pg (ref 26.0–34.0)
MCHC: 32.7 g/dL (ref 30.0–36.0)
MCV: 96.7 fL (ref 80.0–100.0)
Monocytes Absolute: 0.4 10*3/uL (ref 0.1–1.0)
Monocytes Relative: 8 %
Neutro Abs: 2.8 10*3/uL (ref 1.7–7.7)
Neutrophils Relative %: 63 %
Platelet Count: 182 10*3/uL (ref 150–400)
RBC: 3.67 MIL/uL — ABNORMAL LOW (ref 4.22–5.81)
RDW: 13.5 % (ref 11.5–15.5)
WBC Count: 4.5 10*3/uL (ref 4.0–10.5)
nRBC: 0 % (ref 0.0–0.2)

## 2021-12-29 LAB — FERRITIN: Ferritin: 40 ng/mL (ref 24–336)

## 2021-12-29 NOTE — Progress Notes (Signed)
  [facility] OFFICE PROGRESS NOTE   Diagnosis: Iron deficiency anemia  INTERVAL HISTORY:   Mr. Benjamin Davila returns as scheduled.  He continues oral iron 3 times daily.  No constipation.  No nausea or vomiting.  He is not aware of any bleeding.  Objective:  Vital signs in last 24 hours:  Blood pressure 130/75, pulse 80, temperature 98.2 F (36.8 C), temperature source Oral, resp. rate 20, height '5\' 10"'$  (1.778 m), weight 182 lb (82.6 kg), SpO2 100 %.    Resp: Lungs clear bilaterally. Cardio: Regular rate and rhythm.  Systolic murmur. GI: No hepatosplenomegaly. Vascular: No leg edema.   Lab Results:  Lab Results  Component Value Date   WBC 4.5 12/29/2021   HGB 11.6 (L) 12/29/2021   HCT 35.5 (L) 12/29/2021   MCV 96.7 12/29/2021   PLT 182 12/29/2021   NEUTROABS 2.8 12/29/2021    Imaging:  No results found.  Medications: I have reviewed the patient's current medications.  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 pylori organisms; ascending colon, cecum, polyp-tubular adenomas; hepatic flexure polyp, tubular adenoma. 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  He will continue oral iron for now.  We will follow-up on the ferritin from today.  Plan for lab and follow-up in 4 months.    Ned Card ANP/GNP-BC   12/29/2021  10:47 AM

## 2022-01-01 ENCOUNTER — Encounter: Payer: Self-pay | Admitting: Nurse Practitioner

## 2022-01-01 ENCOUNTER — Other Ambulatory Visit: Payer: Self-pay | Admitting: Nurse Practitioner

## 2022-01-01 ENCOUNTER — Other Ambulatory Visit: Payer: Self-pay

## 2022-01-01 ENCOUNTER — Telehealth: Payer: Self-pay

## 2022-01-01 DIAGNOSIS — C61 Malignant neoplasm of prostate: Secondary | ICD-10-CM

## 2022-01-01 DIAGNOSIS — D649 Anemia, unspecified: Secondary | ICD-10-CM

## 2022-01-01 NOTE — Telephone Encounter (Signed)
Patient expresses understanding and schedule for 02/28/22 repeat lab.

## 2022-01-01 NOTE — Telephone Encounter (Signed)
-----   Message from Owens Shark, NP sent at 12/29/2021  5:12 PM EDT ----- Please let him know ferritin is in normal range, 40.  He can discontinue oral iron.  Please schedule him for a follow-up CBC in 2 months.  Thanks

## 2022-01-03 ENCOUNTER — Ambulatory Visit (HOSPITAL_BASED_OUTPATIENT_CLINIC_OR_DEPARTMENT_OTHER): Payer: Medicare Other | Attending: Neurological Surgery | Admitting: Physical Therapy

## 2022-01-03 DIAGNOSIS — R293 Abnormal posture: Secondary | ICD-10-CM | POA: Diagnosis present

## 2022-01-03 DIAGNOSIS — M5459 Other low back pain: Secondary | ICD-10-CM | POA: Insufficient documentation

## 2022-01-03 DIAGNOSIS — M6281 Muscle weakness (generalized): Secondary | ICD-10-CM | POA: Insufficient documentation

## 2022-01-03 DIAGNOSIS — R262 Difficulty in walking, not elsewhere classified: Secondary | ICD-10-CM | POA: Diagnosis present

## 2022-01-03 NOTE — Therapy (Signed)
OUTPATIENT PHYSICAL THERAPY THORACOLUMBAR EVALUATION   Patient Name: Benjamin Davila MRN: [id number] DOB:12/24/1940, 81 y.o., male Today's Date: 01/04/2022   PT End of Session - 01/03/22 1017     Visit Number 1    Number of Visits 21    Date for PT Re-Evaluation 03/16/22    Authorization Type BCBS MCR    Progress Note Due on Visit 10    PT Start Time 0930    PT Stop Time 1012    PT Time Calculation (min) 42 min    Activity Tolerance Patient tolerated treatment well    Behavior During Therapy [facility] for tasks assessed/performed             Past Medical History:  Diagnosis Date   Aortic regurgitation 07/14/2020   Aortic stenosis, moderate 03/17/2019   Autoimmune hepatitis ([place]) 07/12/2020   Benign prostatic hyperplasia without lower urinary tract symptoms 07/12/2020   Cardiac murmur 12/05/2018   Diabetes mellitus due to underlying condition with unspecified complications ([place]) 11/18/1599   Essential hypertension 12/05/2018   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   History of pancreatitis 07/12/2020   History of pancytopenia 07/12/2020   Hyperlipidemia, unspecified 07/12/2020   Irregular heart beat 07/12/2020   Long term (current) use of insulin ([place]) 07/12/2020   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   Lumbar radiculopathy 06/17/2020   Lumbar spondylosis 07/28/2020   Malignant neoplasm of prostate ([place]) 06/24/2019   Mood disorder ([place]) 07/12/2020   Pain of left hip joint 08/25/2019   PONV (postoperative nausea and vomiting)    Prostate cancer ([place])    Spinal stenosis of lumbar region 09/32/3557   Systolic murmur 32/20/2542   Trochanteric bursitis of left hip 03/12/2021   Trochanteric bursitis of right hip 03/12/2021   Type 2 diabetes mellitus with other specified complication ([place]) 70/62/3762   Past Surgical History:  Procedure Laterality Date   BACK SURGERY     lumbar surgery 20 years ago per patient   CHOLECYSTECTOMY     20 years ago per patient   LUMBAR  LAMINECTOMY/DECOMPRESSION MICRODISCECTOMY Bilateral 08/09/2021   Procedure: Lumbar decompressive laminectomy  - Lumbar two-Lumabr three - Lumbar three-Lumbar four - Lumbar four-Lumbar five - Lumabr five-Sacal one - Bilateral, Posterior Lateral Fusion Lumbar three-four-Right;  Surgeon: Eustace Moore, MD;  Location: [place];  Service: Neurosurgery;  Laterality: Bilateral;   TONSILLECTOMY     as a kid   Patient Active Problem List   Diagnosis Date Noted   S/P lumbar laminectomy 08/09/2021   Trochanteric bursitis of left hip 03/12/2021   Trochanteric bursitis of right hip 03/12/2021   Lumbar spondylosis 07/28/2020   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   Aortic regurgitation 07/14/2020   Autoimmune hepatitis ([place]) 07/12/2020   Benign prostatic hyperplasia without lower urinary tract symptoms 07/12/2020   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   History of pancreatitis 07/12/2020   History of pancytopenia 07/12/2020   Hyperlipidemia, unspecified 07/12/2020   Irregular heart beat 07/12/2020   Long term (current) use of insulin ([place]) 07/12/2020   Mood disorder ([place]) 83/15/1761   Systolic murmur 60/73/7106   Type 2 diabetes mellitus with other specified complication ([place]) 26/94/8546   Prostate cancer ([place])    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   Lumbar radiculopathy 06/17/2020   Pain of left hip joint 08/25/2019   Malignant neoplasm of prostate ([place]) 06/24/2019   Aortic stenosis, moderate 03/17/2019   Cardiac murmur 12/05/2018   Essential hypertension 12/05/2018   Diabetes mellitus due to underlying condition with unspecified complications ([place]) 27/07/5007  PCP: Jillyn Ledger, FNP  REFERRING PROVIDER: Sherley Bounds, MD  REFERRING DIAG: [phone] (ICD-10-CM) - Radiculopathy, lumbar region  Rationale for Evaluation and Treatment Rehabilitation  THERAPY DIAG:  Other low back pain  Difficulty in walking, not elsewhere classified  Abnormal posture  Muscle weakness  (generalized)  ONSET DATE: Lumbar Laminectomy & Microdiskectomy on 08/09/21  SUBJECTIVE:                                                                                                                                                                                           SUBJECTIVE STATEMENT: After lumbar surgery it wasn't really all that painful. I thought I may need a THA but Md did not think so from xray. When standing lower back is tight and I feel a burning from Lt buttock and it affects the whole leg- feels it in the thigh- sometimes down to shin. Uses 2 wheeled walker  PERTINENT HISTORY:  Chronic hip and LBP, previous PT for Lt hip  PAIN:  Are you having pain? Yes: NPRS scale: severe and limiting/10 Pain location: across low back and starting into Right hip as well Pain description: it hurts Aggravating factors: AM pain Relieving factors: used to be sitting but not so much ay more   PRECAUTIONS: Fall  WEIGHT BEARING RESTRICTIONS No  FALLS:  Has patient fallen in last 6 months? No  LIVING ENVIRONMENT: Stairs at home- steps to get in from garage with hand rails, stairs in home with bedroom downstairs  OCCUPATION: retired  PLOF: Independent  PATIENT GOALS turning, get up and walk   OBJECTIVE:     SCREENING FOR RED FLAGS: No findings  COGNITION:  Overall cognitive status: Within functional limits for tasks assessed     SENSATION: WFL  MUSCLE LENGTH: General tightness  POSTURE: posterior pelvic tilt, pelvis drifted anteriorly to shoulders with weight in anterior portion of feet.  Ambulates with flexed posture, short step length and flat foot strike; rolling walker outside of home   LUMBAR ROM:   Active  A/PROM  eval  Flexion To knees  Extension Slightly past neutral  Right lateral flexion   Left lateral flexion   Right rotation   Left rotation    (Blank rows = not tested)   LOWER EXTREMITY MMT:    Grossly able to lift against gravity but  requires use of hands to get out of a chair     TODAY'S TREATMENT  Seated piriformis & HS stretch Standing glut sets GAIT training with upright posture   PATIENT EDUCATION:  Education details: Anatomy of condition, POC, HEP, exercise form/rationale Person educated: Patient and Spouse Education method:  Explanation, Demonstration, Tactile cues, Verbal cues, and Handouts Education comprehension: verbalized understanding, returned demonstration, verbal cues required, tactile cues required, and needs further education   HOME EXERCISE PROGRAM: FPFNLKEK      avoid sitting longer than 45 min at a time  ASSESSMENT:  CLINICAL IMPRESSION: Patient is a 81 y.o. M who was seen today for physical therapy evaluation and treatment for LBP and difficulty walking s/p lumbar laminectomy.    OBJECTIVE IMPAIRMENTS Abnormal gait, decreased activity tolerance, decreased balance, decreased mobility, difficulty walking, decreased strength, increased muscle spasms, impaired flexibility, improper body mechanics, postural dysfunction, and pain.   ACTIVITY LIMITATIONS carrying, lifting, bending, sitting, standing, stairs, transfers, and locomotion level  PARTICIPATION LIMITATIONS: meal prep, cleaning, laundry, shopping, community activity, and yard work  PERSONAL FACTORS Past/current experiences, Time since onset of injury/illness/exacerbation, and 1-2 comorbidities: trochanteric bursitis, progression of ambulation limitations  are also affecting patient's functional outcome.   REHAB POTENTIAL: Good  CLINICAL DECISION MAKING: Evolving/moderate complexity  EVALUATION COMPLEXITY: Moderate   GOALS: Goals reviewed with patient? Yes  SHORT TERM GOALS: Target date: 02/01/2022  Able to demo upright posture at rest without cuing Baseline: Goal status: INITIAL   LONG TERM GOALS: Target date: 03/16/22  Able to complete 5TST in 12s or less without UE use Baseline:  Goal status: INITIAL  2.  Able to  ambulate safely without use of RW Baseline:  Goal status: INITIAL  3.  Able to pivot/turn in standing without increased hip pain Baseline:  Goal status: INITIAL  4.  Able to safely navigate stairs, step-over-step and single hand rail use Baseline:  Goal status: INITIAL    PLAN: PT FREQUENCY: 1-2x/week  PT DURATION: 10 weeks  PLANNED INTERVENTIONS: Therapeutic exercises, Therapeutic activity, Neuromuscular re-education, Balance training, Gait training, Patient/Family education, Self Care, Joint mobilization, Stair training, Aquatic Therapy, Dry Needling, Electrical stimulation, Spinal mobilization, Cryotherapy, Moist heat, Taping, Manual therapy, and Re-evaluation.  PLAN FOR NEXT SESSION: begin aquatics- glut and core activation, high stepping to improve clearance, balance tolerance with level pelvis   Kenneth Cuaresma C. Ethel Meisenheimer PT, DPT 01/04/22 8:16 PM

## 2022-01-04 ENCOUNTER — Other Ambulatory Visit: Payer: Self-pay

## 2022-01-04 ENCOUNTER — Encounter (HOSPITAL_BASED_OUTPATIENT_CLINIC_OR_DEPARTMENT_OTHER): Payer: Self-pay | Admitting: Physical Therapy

## 2022-01-05 ENCOUNTER — Encounter (HOSPITAL_BASED_OUTPATIENT_CLINIC_OR_DEPARTMENT_OTHER): Payer: Self-pay | Admitting: Physical Therapy

## 2022-01-05 ENCOUNTER — Ambulatory Visit (HOSPITAL_BASED_OUTPATIENT_CLINIC_OR_DEPARTMENT_OTHER): Payer: Medicare Other | Admitting: Physical Therapy

## 2022-01-05 DIAGNOSIS — M6281 Muscle weakness (generalized): Secondary | ICD-10-CM

## 2022-01-05 DIAGNOSIS — R262 Difficulty in walking, not elsewhere classified: Secondary | ICD-10-CM

## 2022-01-05 DIAGNOSIS — M5459 Other low back pain: Secondary | ICD-10-CM | POA: Diagnosis not present

## 2022-01-05 DIAGNOSIS — R293 Abnormal posture: Secondary | ICD-10-CM

## 2022-01-05 NOTE — Therapy (Signed)
OUTPATIENT PHYSICAL THERAPY THORACOLUMBAR TREATMENT NOTE   Patient Name: Benjamin Davila MRN: [id number] DOB:July 14, 1940, 81 y.o., male Today's Date: 01/05/2022   PT End of Session - 01/05/22 0948     Visit Number 2    Number of Visits 21    Date for PT Re-Evaluation 03/16/22    Authorization Type BCBS MCR    Progress Note Due on Visit 10    PT Start Time 0945    PT Stop Time 1025    PT Time Calculation (min) 40 min    Activity Tolerance Patient tolerated treatment well    Behavior During Therapy [facility] for tasks assessed/performed             Past Medical History:  Diagnosis Date   Aortic regurgitation 07/14/2020   Aortic stenosis, moderate 03/17/2019   Autoimmune hepatitis ([place]) 07/12/2020   Benign prostatic hyperplasia without lower urinary tract symptoms 07/12/2020   Cardiac murmur 12/05/2018   Diabetes mellitus due to underlying condition with unspecified complications ([place]) 34/19/6222   Essential hypertension 12/05/2018   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   History of pancreatitis 07/12/2020   History of pancytopenia 07/12/2020   Hyperlipidemia, unspecified 07/12/2020   Irregular heart beat 07/12/2020   Long term (current) use of insulin ([place]) 07/12/2020   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   Lumbar radiculopathy 06/17/2020   Lumbar spondylosis 07/28/2020   Malignant neoplasm of prostate ([place]) 06/24/2019   Mood disorder ([place]) 07/12/2020   Pain of left hip joint 08/25/2019   PONV (postoperative nausea and vomiting)    Prostate cancer ([place])    Spinal stenosis of lumbar region 97/98/9211   Systolic murmur 94/17/4081   Trochanteric bursitis of left hip 03/12/2021   Trochanteric bursitis of right hip 03/12/2021   Type 2 diabetes mellitus with other specified complication ([place]) 44/81/8563   Past Surgical History:  Procedure Laterality Date   BACK SURGERY     lumbar surgery 20 years ago per patient   CHOLECYSTECTOMY     20 years ago per patient   LUMBAR  LAMINECTOMY/DECOMPRESSION MICRODISCECTOMY Bilateral 08/09/2021   Procedure: Lumbar decompressive laminectomy  - Lumbar two-Lumabr three - Lumbar three-Lumbar four - Lumbar four-Lumbar five - Lumabr five-Sacal one - Bilateral, Posterior Lateral Fusion Lumbar three-four-Right;  Surgeon: Eustace Moore, MD;  Location: [place];  Service: Neurosurgery;  Laterality: Bilateral;   TONSILLECTOMY     as a kid   Patient Active Problem List   Diagnosis Date Noted   S/P lumbar laminectomy 08/09/2021   Trochanteric bursitis of left hip 03/12/2021   Trochanteric bursitis of right hip 03/12/2021   Lumbar spondylosis 07/28/2020   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   Aortic regurgitation 07/14/2020   Autoimmune hepatitis ([place]) 07/12/2020   Benign prostatic hyperplasia without lower urinary tract symptoms 07/12/2020   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   History of pancreatitis 07/12/2020   History of pancytopenia 07/12/2020   Hyperlipidemia, unspecified 07/12/2020   Irregular heart beat 07/12/2020   Long term (current) use of insulin ([place]) 07/12/2020   Mood disorder ([place]) 14/97/0263   Systolic murmur 78/58/8502   Type 2 diabetes mellitus with other specified complication ([place]) 77/41/2878   Prostate cancer ([place])    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   Lumbar radiculopathy 06/17/2020   Pain of left hip joint 08/25/2019   Malignant neoplasm of prostate ([place]) 06/24/2019   Aortic stenosis, moderate 03/17/2019   Cardiac murmur 12/05/2018   Essential hypertension 12/05/2018   Diabetes mellitus due to underlying condition with unspecified complications ([place])  12/05/2018    PCP: Jillyn Ledger, FNP  REFERRING PROVIDER: Sherley Bounds, MD  REFERRING DIAG: [phone] (ICD-10-CM) - Radiculopathy, lumbar region  Rationale for Evaluation and Treatment Rehabilitation  THERAPY DIAG:  Other low back pain  Difficulty in walking, not elsewhere classified  Abnormal posture  Muscle weakness  (generalized)  ONSET DATE: Lumbar Laminectomy & Microdiskectomy on 08/09/21  SUBJECTIVE:                                                                                                                                                                                           SUBJECTIVE STATEMENT: Pt reports no changes since eval.   PERTINENT HISTORY:  Chronic hip and LBP, previous PT for Lt hip  PAIN:  Are you having pain? Yes: NPRS scale: 6-7/10 Pain location: across low back into Lt thigh Pain description: it hurts Aggravating factors: AM pain Relieving factors: used to be sitting but not so much ay more   PRECAUTIONS: Fall  WEIGHT BEARING RESTRICTIONS No  FALLS:  Has patient fallen in last 6 months? No  LIVING ENVIRONMENT: Stairs at home- steps to get in from garage with hand rails, stairs in home with bedroom downstairs  OCCUPATION: retired  PLOF: Independent  PATIENT GOALS turning, get up and walk   OBJECTIVE:     SCREENING FOR RED FLAGS: No findings  COGNITION:  Overall cognitive status: Within functional limits for tasks assessed     SENSATION: WFL  MUSCLE LENGTH: General tightness  POSTURE: posterior pelvic tilt, pelvis drifted anteriorly to shoulders with weight in anterior portion of feet.  Ambulates with flexed posture, short step length and flat foot strike; rolling walker outside of home   LUMBAR ROM:   Active  A/PROM  eval  Flexion To knees  Extension Slightly past neutral  Right lateral flexion   Left lateral flexion   Right rotation   Left rotation    (Blank rows = not tested)   LOWER EXTREMITY MMT:    Grossly able to lift against gravity but requires use of hands to get out of a chair     TODAY'S TREATMENT  Pt seen for aquatic therapy today.  Treatment took place in water 3.25-4.5 ft in depth at the [facility]. Temp of water was 91.  Pt entered/exited the pool via stairs independently with bilat  rail.  -forward / backward walking, cues for posture correction - side stepping, cues to decreased step length and speed - high knee march forward and backward - holding wall: hip abdct x 12 each; hip ext x 10; squats  - sit to/from stand at bench in water  -  visually reviewed how to get in/out of bed via log roll (sit to/from sidelying roll to/from supine)  Pt requires the buoyancy and hydrostatic pressure of water for support, and to offload joints by unweighting joint load by at least 50 % in navel deep water and by at least 75-80% in chest to neck deep water.  Viscosity of the water is needed for resistance of strengthening. Water current perturbations provides challenge to standing balance requiring increased core activation.    PATIENT EDUCATION:  Education details: exercise form/rationale Person educated: Patient and Spouse Education method: Explanation, Demonstration, Tactile cues, Verbal cues, and Handouts Education comprehension: verbalized understanding, returned demonstration, verbal cues required, tactile cues required, and needs further education   HOME EXERCISE PROGRAM: FPFNLKEK      avoid sitting longer than 45 min at a time  ASSESSMENT:  CLINICAL IMPRESSION: Cues given to patient throughout session to bring head to more neutral position from extended position with chin up, as well as core engagement with exercises.  Pt would benefit from further instruction on core engagement with body mechanics of transitional movements as sit to/from stand.  Pt reported no increase in back pain while in the water.  Most exercises completed in depth greater than 4 ft.  Goals are ongoing.   OBJECTIVE IMPAIRMENTS Abnormal gait, decreased activity tolerance, decreased balance, decreased mobility, difficulty walking, decreased strength, increased muscle spasms, impaired flexibility, improper body mechanics, postural dysfunction, and pain.   ACTIVITY LIMITATIONS carrying, lifting, bending,  sitting, standing, stairs, transfers, and locomotion level  PARTICIPATION LIMITATIONS: meal prep, cleaning, laundry, shopping, community activity, and yard work  PERSONAL FACTORS Past/current experiences, Time since onset of injury/illness/exacerbation, and 1-2 comorbidities: trochanteric bursitis, progression of ambulation limitations  are also affecting patient's functional outcome.   REHAB POTENTIAL: Good  CLINICAL DECISION MAKING: Evolving/moderate complexity  EVALUATION COMPLEXITY: Moderate   GOALS: Goals reviewed with patient? Yes  SHORT TERM GOALS: Target date: 02/01/2022  Able to demo upright posture at rest without cuing Baseline: Goal status: INITIAL   LONG TERM GOALS: Target date: 03/16/22  Able to complete 5TST in 12s or less without UE use Baseline:  Goal status: INITIAL  2.  Able to ambulate safely without use of RW Baseline:  Goal status: INITIAL  3.  Able to pivot/turn in standing without increased hip pain Baseline:  Goal status: INITIAL  4.  Able to safely navigate stairs, step-over-step and single hand rail use Baseline:  Goal status: INITIAL    PLAN: PT FREQUENCY: 1-2x/week  PT DURATION: 10 weeks  PLANNED INTERVENTIONS: Therapeutic exercises, Therapeutic activity, Neuromuscular re-education, Balance training, Gait training, Patient/Family education, Self Care, Joint mobilization, Stair training, Aquatic Therapy, Dry Needling, Electrical stimulation, Spinal mobilization, Cryotherapy, Moist heat, Taping, Manual therapy, and Re-evaluation.  PLAN FOR NEXT SESSION: continue aquatics- glut and core activation, high stepping to improve clearance, balance tolerance with level pelvis   Kerin Perna, PTA 01/05/22 12:51 PM

## 2022-01-08 ENCOUNTER — Encounter (HOSPITAL_BASED_OUTPATIENT_CLINIC_OR_DEPARTMENT_OTHER): Payer: Self-pay | Admitting: Physical Therapy

## 2022-01-08 ENCOUNTER — Ambulatory Visit (HOSPITAL_BASED_OUTPATIENT_CLINIC_OR_DEPARTMENT_OTHER): Payer: Medicare Other | Admitting: Physical Therapy

## 2022-01-08 DIAGNOSIS — M6281 Muscle weakness (generalized): Secondary | ICD-10-CM

## 2022-01-08 DIAGNOSIS — M5459 Other low back pain: Secondary | ICD-10-CM

## 2022-01-08 DIAGNOSIS — R293 Abnormal posture: Secondary | ICD-10-CM

## 2022-01-08 DIAGNOSIS — R262 Difficulty in walking, not elsewhere classified: Secondary | ICD-10-CM

## 2022-01-08 NOTE — Therapy (Signed)
OUTPATIENT PHYSICAL THERAPY THORACOLUMBAR TREATMENT NOTE   Patient Name: Benjamin Davila MRN: [id number] DOB:1940-09-21, 81 y.o., male Today's Date: 01/08/2022   PT End of Session - 01/08/22 0944     Visit Number 3    Number of Visits 21    Date for PT Re-Evaluation 03/16/22    Authorization Type BCBS MCR    Progress Note Due on Visit 10    PT Start Time 0944    PT Stop Time 1025    PT Time Calculation (min) 41 min    Activity Tolerance Patient tolerated treatment well    Behavior During Therapy [facility] for tasks assessed/performed             Past Medical History:  Diagnosis Date   Aortic regurgitation 07/14/2020   Aortic stenosis, moderate 03/17/2019   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/12/2020   Benign prostatic hyperplasia without lower urinary tract symptoms 07/12/2020   Cardiac murmur 12/05/2018   Diabetes mellitus due to underlying condition with unspecified complications ([place]) 81/44/8185   Essential hypertension 12/05/2018   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   History of pancreatitis 07/12/2020   History of pancytopenia 07/12/2020   Hyperlipidemia, unspecified 07/12/2020   Irregular heart beat 07/12/2020   Long term (current) use of insulin ([place]) 07/12/2020   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   Lumbar radiculopathy 06/17/2020   Lumbar spondylosis 07/28/2020   Malignant neoplasm of prostate ([place]) 06/24/2019   Mood disorder ([place]) 07/12/2020   Pain of left hip joint 08/25/2019   PONV (postoperative nausea and vomiting)    Prostate cancer ([place])    Spinal stenosis of lumbar region 63/14/9702   Systolic murmur 63/78/5885   Trochanteric bursitis of left hip 03/12/2021   Trochanteric bursitis of right hip 03/12/2021   Type 2 diabetes mellitus with other specified complication ([place]) 02/77/4128   Past Surgical History:  Procedure Laterality Date   BACK SURGERY     lumbar surgery 20 years ago per patient   CHOLECYSTECTOMY     20 years ago per patient   LUMBAR  LAMINECTOMY/DECOMPRESSION MICRODISCECTOMY Bilateral 08/09/2021   Procedure: Lumbar decompressive laminectomy  - Lumbar two-Lumabr three - Lumbar three-Lumbar four - Lumbar four-Lumbar five - Lumabr five-Sacal one - Bilateral, Posterior Lateral Fusion Lumbar three-four-Right;  Surgeon: Eustace Moore, MD;  Location: [place];  Service: Neurosurgery;  Laterality: Bilateral;   TONSILLECTOMY     as a kid   Patient Active Problem List   Diagnosis Date Noted   S/P lumbar laminectomy 08/09/2021   Trochanteric bursitis of left hip 03/12/2021   Trochanteric bursitis of right hip 03/12/2021   Lumbar spondylosis 07/28/2020   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   Aortic regurgitation 07/14/2020   Autoimmune hepatitis ([place]) 07/12/2020   Benign prostatic hyperplasia without lower urinary tract symptoms 07/12/2020   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   History of pancreatitis 07/12/2020   History of pancytopenia 07/12/2020   Hyperlipidemia, unspecified 07/12/2020   Irregular heart beat 07/12/2020   Long term (current) use of insulin ([place]) 07/12/2020   Mood disorder ([place]) 78/67/6720   Systolic murmur 94/70/9628   Type 2 diabetes mellitus with other specified complication ([place]) 36/62/9476   Prostate cancer ([place])    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   Lumbar radiculopathy 06/17/2020   Pain of left hip joint 08/25/2019   Malignant neoplasm of prostate ([place]) 06/24/2019   Aortic stenosis, moderate 03/17/2019   Cardiac murmur 12/05/2018   Essential hypertension 12/05/2018   Diabetes mellitus due to underlying condition with unspecified complications ([place])  12/05/2018    PCP: Jillyn Ledger, FNP  REFERRING PROVIDER: Sherley Bounds, MD  REFERRING DIAG: [phone] (ICD-10-CM) - Radiculopathy, lumbar region  Rationale for Evaluation and Treatment Rehabilitation  THERAPY DIAG:  Other low back pain  Difficulty in walking, not elsewhere classified  Abnormal posture  Muscle weakness  (generalized)  ONSET DATE: Lumbar Laminectomy & Microdiskectomy on 08/09/21  SUBJECTIVE:                                                                                                                                                                                           SUBJECTIVE STATEMENT: Pt reports he "felt just average" in afternoon of last session.  He reports he is having difficulty sleeping through the night; back throbbing.   PERTINENT HISTORY:  Chronic hip and LBP, previous PT for Lt hip  PAIN:  Are you having pain? Yes: NPRS scale: 8/10 Pain location: across low back into Lt calf Pain description: it hurts Aggravating factors: AM pain Relieving factors: used to be sitting but not so much any more   PRECAUTIONS: Fall  WEIGHT BEARING RESTRICTIONS No  FALLS:  Has patient fallen in last 6 months? No  LIVING ENVIRONMENT: Stairs at home- steps to get in from garage with hand rails, stairs in home with bedroom downstairs  OCCUPATION: retired  PLOF: Independent  PATIENT GOALS turning, get up and walk   OBJECTIVE:     SCREENING FOR RED FLAGS: No findings  COGNITION:  Overall cognitive status: Within functional limits for tasks assessed     SENSATION: WFL  MUSCLE LENGTH: General tightness  POSTURE: posterior pelvic tilt, pelvis drifted anteriorly to shoulders with weight in anterior portion of feet.  Ambulates with flexed posture, short step length and flat foot strike; rolling walker outside of home   LUMBAR ROM:   Active  A/PROM  eval  Flexion To knees  Extension Slightly past neutral  Right lateral flexion   Left lateral flexion   Right rotation   Left rotation    (Blank rows = not tested)   LOWER EXTREMITY MMT:    Grossly able to lift against gravity but requires use of hands to get out of a chair     TODAY'S TREATMENT  On pool deck: reviewed STS with core engagement; returned demo with cues x 3  Pt seen for aquatic therapy  today.  Treatment took place in water 3.25-4.5 ft in depth at the [facility]. Temp of water was 91.  Pt entered/exited the pool via stairs independently with bilat rail.  -forward / backward walking - side stepping, with/ without arm abdct / add - high  knee march forward, cues to decrease hip flexion - holding wall: hip openers; cross step backs (like a curtsy); hip abdct x 20 each;  squats; heel raises  - seated on 4th step- glute stretch in fig 4 position pulling knee to opp shoulder - sit to/from stand from 4th step x 5 without UE  - hamstring stretch with foot on 2nd step x 2;  quad stretch with foot on 2nd step behind him x 2  - pt instructed in self massage with ball against wall to LB / glute musculature; pt returned demo with cues.   Pt requires the buoyancy and hydrostatic pressure of water for support, and to offload joints by unweighting joint load by at least 50 % in navel deep water and by at least 75-80% in chest to neck deep water.  Viscosity of the water is needed for resistance of strengthening. Water current perturbations provides challenge to standing balance requiring increased core activation.    PATIENT EDUCATION:  Education details: exercise form/rationale Person educated: Patient and Spouse Education method: Explanation, Demonstration, Tactile cues, Verbal cues, and Handouts Education comprehension: verbalized understanding, returned demonstration, verbal cues required, tactile cues required, and needs further education   HOME EXERCISE PROGRAM: FPFNLKEK      avoid sitting longer than 45 min at a time  ASSESSMENT:  CLINICAL IMPRESSION: Pt holding head in more neutral position this day compared to last session, ( extended position with chin up) Continued cues for core engagement with exercises.  Pt would benefit from further instruction on core engagement with body mechanics of transitional movements as sit to/from stand.  Pt reported no increase in  back pain while in the water.  Most exercises completed in depth greater than 4 ft.  Goals are ongoing.   OBJECTIVE IMPAIRMENTS Abnormal gait, decreased activity tolerance, decreased balance, decreased mobility, difficulty walking, decreased strength, increased muscle spasms, impaired flexibility, improper body mechanics, postural dysfunction, and pain.   ACTIVITY LIMITATIONS carrying, lifting, bending, sitting, standing, stairs, transfers, and locomotion level  PARTICIPATION LIMITATIONS: meal prep, cleaning, laundry, shopping, community activity, and yard work  PERSONAL FACTORS Past/current experiences, Time since onset of injury/illness/exacerbation, and 1-2 comorbidities: trochanteric bursitis, progression of ambulation limitations  are also affecting patient's functional outcome.   REHAB POTENTIAL: Good  CLINICAL DECISION MAKING: Evolving/moderate complexity  EVALUATION COMPLEXITY: Moderate   GOALS: Goals reviewed with patient? Yes  SHORT TERM GOALS: Target date: 02/01/2022  Able to demo upright posture at rest without cuing Baseline: Goal status: INITIAL   LONG TERM GOALS: Target date: 03/16/22  Able to complete 5TST in 12s or less without UE use Baseline:  Goal status: INITIAL  2.  Able to ambulate safely without use of RW Baseline:  Goal status: INITIAL  3.  Able to pivot/turn in standing without increased hip pain Baseline:  Goal status: INITIAL  4.  Able to safely navigate stairs, step-over-step and single hand rail use Baseline:  Goal status: INITIAL    PLAN: PT FREQUENCY: 1-2x/week  PT DURATION: 10 weeks  PLANNED INTERVENTIONS: Therapeutic exercises, Therapeutic activity, Neuromuscular re-education, Balance training, Gait training, Patient/Family education, Self Care, Joint mobilization, Stair training, Aquatic Therapy, Dry Needling, Electrical stimulation, Spinal mobilization, Cryotherapy, Moist heat, Taping, Manual therapy, and Re-evaluation.  PLAN  FOR NEXT SESSION: continue aquatics- glut and core activation, high stepping to improve clearance, balance tolerance with level pelvis   Kerin Perna, PTA 01/08/22 10:30 AM

## 2022-01-10 ENCOUNTER — Encounter (HOSPITAL_BASED_OUTPATIENT_CLINIC_OR_DEPARTMENT_OTHER): Payer: Self-pay | Admitting: Physical Therapy

## 2022-01-10 ENCOUNTER — Ambulatory Visit (HOSPITAL_BASED_OUTPATIENT_CLINIC_OR_DEPARTMENT_OTHER): Payer: Medicare Other | Admitting: Physical Therapy

## 2022-01-10 DIAGNOSIS — R293 Abnormal posture: Secondary | ICD-10-CM

## 2022-01-10 DIAGNOSIS — R262 Difficulty in walking, not elsewhere classified: Secondary | ICD-10-CM

## 2022-01-10 DIAGNOSIS — M5459 Other low back pain: Secondary | ICD-10-CM

## 2022-01-10 DIAGNOSIS — M6281 Muscle weakness (generalized): Secondary | ICD-10-CM

## 2022-01-10 NOTE — Therapy (Signed)
OUTPATIENT PHYSICAL THERAPY THORACOLUMBAR TREATMENT NOTE   Patient Name: Benjamin Davila MRN: [id number] DOB:1941/04/06, 81 y.o., male Today's Date: 01/10/2022   PT End of Session - 01/10/22 1012     Visit Number 4    Number of Visits 21    Date for PT Re-Evaluation 03/16/22    Authorization Type BCBS MCR    PT Start Time 0845    PT Stop Time 0932    PT Time Calculation (min) 47 min    Activity Tolerance Patient tolerated treatment well    Behavior During Therapy [facility] for tasks assessed/performed              Past Medical History:  Diagnosis Date   Aortic regurgitation 07/14/2020   Aortic stenosis, moderate 03/17/2019   Autoimmune hepatitis ([place]) 07/12/2020   Benign prostatic hyperplasia without lower urinary tract symptoms 07/12/2020   Cardiac murmur 12/05/2018   Diabetes mellitus due to underlying condition with unspecified complications ([place]) 24/01/7352   Essential hypertension 12/05/2018   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   History of pancreatitis 07/12/2020   History of pancytopenia 07/12/2020   Hyperlipidemia, unspecified 07/12/2020   Irregular heart beat 07/12/2020   Long term (current) use of insulin ([place]) 07/12/2020   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   Lumbar radiculopathy 06/17/2020   Lumbar spondylosis 07/28/2020   Malignant neoplasm of prostate ([place]) 06/24/2019   Mood disorder ([place]) 07/12/2020   Pain of left hip joint 08/25/2019   PONV (postoperative nausea and vomiting)    Prostate cancer ([place])    Spinal stenosis of lumbar region 29/92/4268   Systolic murmur 34/19/6222   Trochanteric bursitis of left hip 03/12/2021   Trochanteric bursitis of right hip 03/12/2021   Type 2 diabetes mellitus with other specified complication ([place]) 97/98/9211   Past Surgical History:  Procedure Laterality Date   BACK SURGERY     lumbar surgery 20 years ago per patient   CHOLECYSTECTOMY     20 years ago per patient   LUMBAR LAMINECTOMY/DECOMPRESSION  MICRODISCECTOMY Bilateral 08/09/2021   Procedure: Lumbar decompressive laminectomy  - Lumbar two-Lumabr three - Lumbar three-Lumbar four - Lumbar four-Lumbar five - Lumabr five-Sacal one - Bilateral, Posterior Lateral Fusion Lumbar three-four-Right;  Surgeon: Eustace Moore, MD;  Location: [place];  Service: Neurosurgery;  Laterality: Bilateral;   TONSILLECTOMY     as a kid   Patient Active Problem List   Diagnosis Date Noted   S/P lumbar laminectomy 08/09/2021   Trochanteric bursitis of left hip 03/12/2021   Trochanteric bursitis of right hip 03/12/2021   Lumbar spondylosis 07/28/2020   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   Aortic regurgitation 07/14/2020   Autoimmune hepatitis ([place]) 07/12/2020   Benign prostatic hyperplasia without lower urinary tract symptoms 07/12/2020   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   History of pancreatitis 07/12/2020   History of pancytopenia 07/12/2020   Hyperlipidemia, unspecified 07/12/2020   Irregular heart beat 07/12/2020   Long term (current) use of insulin ([place]) 07/12/2020   Mood disorder ([place]) 94/17/4081   Systolic murmur 44/81/8563   Type 2 diabetes mellitus with other specified complication ([place]) 14/97/0263   Prostate cancer ([place])    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   Lumbar radiculopathy 06/17/2020   Pain of left hip joint 08/25/2019   Malignant neoplasm of prostate ([place]) 06/24/2019   Aortic stenosis, moderate 03/17/2019   Cardiac murmur 12/05/2018   Essential hypertension 12/05/2018   Diabetes mellitus due to underlying condition with unspecified complications ([place]) 78/58/8502    PCP: Jillyn Ledger, FNP  REFERRING PROVIDER: Sherley Bounds, MD  REFERRING DIAG: M54.16 (ICD-10-CM) - Radiculopathy, lumbar region  Rationale for Evaluation and Treatment Rehabilitation  THERAPY DIAG:  Other low back pain  Difficulty in walking, not elsewhere classified  Abnormal posture  Muscle weakness (generalized)  ONSET DATE: Lumbar  Laminectomy & Microdiskectomy on 08/09/21  SUBJECTIVE:                                                                                                                                                                                           SUBJECTIVE STATEMENT: Pt reports he He has continued to have significant pain in his left hip andinto his left leg. He feels like today is a bit worse.   PERTINENT HISTORY:  Chronic hip and LBP, previous PT for Lt hip  PAIN:  Are you having pain? Yes: NPRS scale: 8/10 with walking  Pain location: across low back into Lt calf Pain description: it hurts Aggravating factors: AM pain Relieving factors: used to be sitting but not so much any more   PRECAUTIONS: Fall  WEIGHT BEARING RESTRICTIONS No  FALLS:  Has patient fallen in last 6 months? No  LIVING ENVIRONMENT: Stairs at home- steps to get in from garage with hand rails, stairs in home with bedroom downstairs  OCCUPATION: retired  PLOF: Independent  PATIENT GOALS turning, get up and walk   OBJECTIVE:     SCREENING FOR RED FLAGS: No findings  COGNITION:  Overall cognitive status: Within functional limits for tasks assessed     SENSATION: WFL  MUSCLE LENGTH: General tightness  POSTURE: posterior pelvic tilt, pelvis drifted anteriorly to shoulders with weight in anterior portion of feet.  Ambulates with flexed posture, short step length and flat foot strike; rolling walker outside of home   LUMBAR ROM:   Active  A/PROM  eval  Flexion To knees  Extension Slightly past neutral  Right lateral flexion   Left lateral flexion   Right rotation   Left rotation    (Blank rows = not tested)   LOWER EXTREMITY MMT:    Grossly able to lift against gravity but requires use of hands to get out of a chair     TODAY'S TREATMENT   8/23 Extensive education on how to use the program. Reviewed stretches vs exercises; reviewed self soft tissue mobilization with a tennis ball and  thera-cane.   Trigger point release to left gluteal in side lying; trigger point release to right IT band LAD grade I and II for pain relief to the lft GGY:IRSW improvement in pain noted.   LTR x20 Reviwed gluteal stretch 2x20 sec hold   Supine  march 2x10  Supine bridge 2x10   Reviewed self soft tissue mobilization with tennis ball and thera-cane.    Last visit   On pool deck: reviewed STS with core engagement; returned demo with cues x 3  Pt seen for aquatic therapy today.  Treatment took place in water 3.25-4.5 ft in depth at the [facility]. Temp of water was 91.  Pt entered/exited the pool via stairs independently with bilat rail.  -forward / backward walking - side stepping, with/ without arm abdct / add - high knee march forward, cues to decrease hip flexion - holding wall: hip openers; cross step backs (like a curtsy); hip abdct x 20 each;  squats; heel raises  - seated on 4th step- glute stretch in fig 4 position pulling knee to opp shoulder - sit to/from stand from 4th step x 5 without UE  - hamstring stretch with foot on 2nd step x 2;  quad stretch with foot on 2nd step behind him x 2  - pt instructed in self massage with ball against wall to LB / glute musculature; pt returned demo with cues.   Pt requires the buoyancy and hydrostatic pressure of water for support, and to offload joints by unweighting joint load by at least 50 % in navel deep water and by at least 75-80% in chest to neck deep water.  Viscosity of the water is needed for resistance of strengthening. Water current perturbations provides challenge to standing balance requiring increased core activation.    PATIENT EDUCATION:  Education details: exercise form/rationale Person educated: Patient and Spouse Education method: Explanation, Demonstration, Tactile cues, Verbal cues, and Handouts Education comprehension: verbalized understanding, returned demonstration, verbal cues required,  tactile cues required, and needs further education   HOME EXERCISE PROGRAM: FPFNLKEK      avoid sitting longer than 45 min at a time  ASSESSMENT:  CLINICAL IMPRESSION: Therapy spent significant time explaining how we use the program. He was advised to look for stretches and things that reduce the pain, and exercises that are tolerable. His pain level is high today. His symptoms are exacerbated. He was advised when he is like this it is best to focus more on his stretches and light exercises. When his pain level goes down it is time to work hard on the exercises. He will require further education. We will expand upon his land program next visit.  OBJECTIVE IMPAIRMENTS Abnormal gait, decreased activity tolerance, decreased balance, decreased mobility, difficulty walking, decreased strength, increased muscle spasms, impaired flexibility, improper body mechanics, postural dysfunction, and pain.   ACTIVITY LIMITATIONS carrying, lifting, bending, sitting, standing, stairs, transfers, and locomotion level  PARTICIPATION LIMITATIONS: meal prep, cleaning, laundry, shopping, community activity, and yard work  PERSONAL FACTORS Past/current experiences, Time since onset of injury/illness/exacerbation, and 1-2 comorbidities: trochanteric bursitis, progression of ambulation limitations  are also affecting patient's functional outcome.   REHAB POTENTIAL: Good  CLINICAL DECISION MAKING: Evolving/moderate complexity  EVALUATION COMPLEXITY: Moderate   GOALS: Goals reviewed with patient? Yes  SHORT TERM GOALS: Target date: 02/01/2022  Able to demo upright posture at rest without cuing Baseline: Goal status: INITIAL   LONG TERM GOALS: Target date: 03/16/22  Able to complete 5TST in 12s or less without UE use Baseline:  Goal status: INITIAL  2.  Able to ambulate safely without use of RW Baseline:  Goal status: INITIAL  3.  Able to pivot/turn in standing without increased hip pain Baseline:   Goal status: INITIAL  4.  Able to  safely navigate stairs, step-over-step and single hand rail use Baseline:  Goal status: INITIAL    PLAN: PT FREQUENCY: 1-2x/week  PT DURATION: 10 weeks  PLANNED INTERVENTIONS: Therapeutic exercises, Therapeutic activity, Neuromuscular re-education, Balance training, Gait training, Patient/Family education, Self Care, Joint mobilization, Stair training, Aquatic Therapy, Dry Needling, Electrical stimulation, Spinal mobilization, Cryotherapy, Moist heat, Taping, Manual therapy, and Re-evaluation.  PLAN FOR NEXT SESSION: continue aquatics- glut and core activation, high stepping to improve clearance, balance tolerance with level pelvis   Carolyne Littles PT DPT  01/10/22 12:34 PM

## 2022-01-15 ENCOUNTER — Encounter (HOSPITAL_BASED_OUTPATIENT_CLINIC_OR_DEPARTMENT_OTHER): Payer: Medicare Other | Admitting: Physical Therapy

## 2022-01-25 ENCOUNTER — Ambulatory Visit (HOSPITAL_BASED_OUTPATIENT_CLINIC_OR_DEPARTMENT_OTHER): Payer: Medicare Other | Attending: Neurological Surgery | Admitting: Physical Therapy

## 2022-01-25 ENCOUNTER — Encounter (HOSPITAL_BASED_OUTPATIENT_CLINIC_OR_DEPARTMENT_OTHER): Payer: Self-pay | Admitting: Physical Therapy

## 2022-01-25 DIAGNOSIS — R262 Difficulty in walking, not elsewhere classified: Secondary | ICD-10-CM | POA: Insufficient documentation

## 2022-01-25 DIAGNOSIS — M5459 Other low back pain: Secondary | ICD-10-CM | POA: Insufficient documentation

## 2022-01-25 DIAGNOSIS — R293 Abnormal posture: Secondary | ICD-10-CM | POA: Diagnosis present

## 2022-01-25 DIAGNOSIS — M25552 Pain in left hip: Secondary | ICD-10-CM | POA: Diagnosis present

## 2022-01-25 DIAGNOSIS — M6281 Muscle weakness (generalized): Secondary | ICD-10-CM | POA: Diagnosis present

## 2022-01-25 NOTE — Therapy (Signed)
OUTPATIENT PHYSICAL THERAPY THORACOLUMBAR TREATMENT NOTE   Patient Name: Benjamin Davila MRN: [id number] DOB:Dec 25, 1940, 81 y.o., male Today's Date: 01/25/2022   PT End of Session - 01/25/22 1207     Visit Number 5    Number of Visits 21    Date for PT Re-Evaluation 03/16/22    Authorization Type BCBS MCR    PT Start Time 1202    PT Stop Time 1240    PT Time Calculation (min) 38 min    Activity Tolerance Patient tolerated treatment well    Behavior During Therapy [facility] for tasks assessed/performed              Past Medical History:  Diagnosis Date   Aortic regurgitation 07/14/2020   Aortic stenosis, moderate 03/17/2019   Autoimmune hepatitis ([place]) 07/12/2020   Benign prostatic hyperplasia without lower urinary tract symptoms 07/12/2020   Cardiac murmur 12/05/2018   Diabetes mellitus due to underlying condition with unspecified complications ([place]) 29/92/4268   Essential hypertension 12/05/2018   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   History of pancreatitis 07/12/2020   History of pancytopenia 07/12/2020   Hyperlipidemia, unspecified 07/12/2020   Irregular heart beat 07/12/2020   Long term (current) use of insulin ([place]) 07/12/2020   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   Lumbar radiculopathy 06/17/2020   Lumbar spondylosis 07/28/2020   Malignant neoplasm of prostate ([place]) 06/24/2019   Mood disorder ([place]) 07/12/2020   Pain of left hip joint 08/25/2019   PONV (postoperative nausea and vomiting)    Prostate cancer ([place])    Spinal stenosis of lumbar region 34/19/6222   Systolic murmur 97/98/9211   Trochanteric bursitis of left hip 03/12/2021   Trochanteric bursitis of right hip 03/12/2021   Type 2 diabetes mellitus with other specified complication ([place]) 94/17/4081   Past Surgical History:  Procedure Laterality Date   BACK SURGERY     lumbar surgery 20 years ago per patient   CHOLECYSTECTOMY     20 years ago per patient   LUMBAR LAMINECTOMY/DECOMPRESSION  MICRODISCECTOMY Bilateral 08/09/2021   Procedure: Lumbar decompressive laminectomy  - Lumbar two-Lumabr three - Lumbar three-Lumbar four - Lumbar four-Lumbar five - Lumabr five-Sacal one - Bilateral, Posterior Lateral Fusion Lumbar three-four-Right;  Surgeon: Eustace Moore, MD;  Location: [place];  Service: Neurosurgery;  Laterality: Bilateral;   TONSILLECTOMY     as a kid   Patient Active Problem List   Diagnosis Date Noted   S/P lumbar laminectomy 08/09/2021   Trochanteric bursitis of left hip 03/12/2021   Trochanteric bursitis of right hip 03/12/2021   Lumbar spondylosis 07/28/2020   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   Aortic regurgitation 07/14/2020   Autoimmune hepatitis ([place]) 07/12/2020   Benign prostatic hyperplasia without lower urinary tract symptoms 07/12/2020   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   History of pancreatitis 07/12/2020   History of pancytopenia 07/12/2020   Hyperlipidemia, unspecified 07/12/2020   Irregular heart beat 07/12/2020   Long term (current) use of insulin ([place]) 07/12/2020   Mood disorder ([place]) 44/81/8563   Systolic murmur 14/97/0263   Type 2 diabetes mellitus with other specified complication ([place]) 78/58/8502   Prostate cancer ([place])    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   Lumbar radiculopathy 06/17/2020   Pain of left hip joint 08/25/2019   Malignant neoplasm of prostate ([place]) 06/24/2019   Aortic stenosis, moderate 03/17/2019   Cardiac murmur 12/05/2018   Essential hypertension 12/05/2018   Diabetes mellitus due to underlying condition with unspecified complications ([place]) 77/41/2878    PCP: Jillyn Ledger, FNP  REFERRING PROVIDER: Sherley Bounds, MD  REFERRING DIAG: M54.16 (ICD-10-CM) - Radiculopathy, lumbar region  Rationale for Evaluation and Treatment Rehabilitation  THERAPY DIAG:  Other low back pain  Difficulty in walking, not elsewhere classified  Abnormal posture  Muscle weakness (generalized)  ONSET DATE: Lumbar  Laminectomy & Microdiskectomy on 08/09/21  SUBJECTIVE:                                                                                                                                                                                           SUBJECTIVE STATEMENT: "I feel worse".  Pt reports his wife bought him a rollator at a resale shop; he brought it with today.  "I like that I can put my stuff on the seat".  He reports he continues to have difficulty sleeping.   PERTINENT HISTORY:  Chronic hip and LBP, previous PT for Lt hip  PAIN:  Are you having pain? Yes: NPRS scale: 8/10 with walking, 3/10 current when sitting  Pain location: across low back into Lt thigh Pain description: it hurts Aggravating factors: AM pain Relieving factors: used to be sitting but not so much any more   PRECAUTIONS: Fall  WEIGHT BEARING RESTRICTIONS No  FALLS:  Has patient fallen in last 6 months? No  LIVING ENVIRONMENT: Stairs at home- steps to get in from garage with hand rails, stairs in home with bedroom downstairs  OCCUPATION: retired  PLOF: Independent  PATIENT GOALS turning, get up and walk   OBJECTIVE:  Findings taken at evaluation unless otherwise noted.     SCREENING FOR RED FLAGS: No findings  COGNITION:  Overall cognitive status: Within functional limits for tasks assessed     SENSATION: WFL  MUSCLE LENGTH: General tightness  POSTURE: posterior pelvic tilt, pelvis drifted anteriorly to shoulders with weight in anterior portion of feet.  Ambulates with flexed posture, short step length and flat foot strike; rolling walker outside of home   LUMBAR ROM:   Active  A/PROM  eval  Flexion To knees  Extension Slightly past neutral  Right lateral flexion   Left lateral flexion   Right rotation   Left rotation    (Blank rows = not tested)   LOWER EXTREMITY MMT:    Grossly able to lift against gravity but requires use of hands to get out of a chair     TODAY'S  TREATMENT  Pt seen for aquatic therapy today.  Treatment took place in water 3.25-4.5 ft in depth at the [facility]. Temp of water was 91.  Pt entered/exited the pool via stairs independently with bilat rail.  -forward / backward walking, side stepping  -  straddling yellow noodle and holding corner:  cycling (cues for upright trunk) - holding wall: hip abdct/ add; hip ext; hip openers; curtsy (all four exercises alternating), heel raises, squats  - high knee marching forward/ backward - supported supine float for spinal decompression (yellow noodle behind back under arms, blue noodle under knees) - sit to/from stand from 3rd step x 6 without UE  - fig 4 position stretch holding rails, R/L - hamstring stretch with foot on 3rd step  - side to side lunge for adductor stretch holding rails  Adjusted height of handles on his new rollator.  Pt requires the buoyancy and hydrostatic pressure of water for support, and to offload joints by unweighting joint load by at least 50 % in navel deep water and by at least 75-80% in chest to neck deep water.  Viscosity of the water is needed for resistance of strengthening. Water current perturbations provides challenge to standing balance requiring increased core activation.   8/23 - last treatment Extensive education on how to use the program. Reviewed stretches vs exercises; reviewed self soft tissue mobilization with a tennis ball and thera-cane.   Trigger point release to left gluteal in side lying; trigger point release to right IT band LAD grade I and II for pain relief to the lft MWU:XLKG improvement in pain noted.   LTR x20 Reviwed gluteal stretch 2x20 sec hold   Supine march 2x10  Supine bridge 2x10   Reviewed self soft tissue mobilization with tennis ball and thera-cane.    PATIENT EDUCATION:  Education details: exercise form/rationale Person educated: Patient and Spouse Education method: Explanation, Demonstration,  Tactile cues, Verbal cues, and Handouts Education comprehension: verbalized understanding, returned demonstration, verbal cues required, tactile cues required, and needs further education   HOME EXERCISE PROGRAM: FPFNLKEK      avoid sitting longer than 45 min at a time  ASSESSMENT:  CLINICAL IMPRESSION: Pt reports some increased discomfort in Lt glute with hip flexion and adduction.  Complains of increased stiffness in back during session, especially after hip ext at wall.  He was able to straddle noodle and cycle, but required UE support on wall.  Pt reported some mild relief of tightness in low back after supine supported float. R hip is observed to be tighter than LLE with LE stretches.  Goals are ongoing.    He will require further education. We will expand upon his land program next visit.  OBJECTIVE IMPAIRMENTS Abnormal gait, decreased activity tolerance, decreased balance, decreased mobility, difficulty walking, decreased strength, increased muscle spasms, impaired flexibility, improper body mechanics, postural dysfunction, and pain.   ACTIVITY LIMITATIONS carrying, lifting, bending, sitting, standing, stairs, transfers, and locomotion level  PARTICIPATION LIMITATIONS: meal prep, cleaning, laundry, shopping, community activity, and yard work  PERSONAL FACTORS Past/current experiences, Time since onset of injury/illness/exacerbation, and 1-2 comorbidities: trochanteric bursitis, progression of ambulation limitations  are also affecting patient's functional outcome.   REHAB POTENTIAL: Good  CLINICAL DECISION MAKING: Evolving/moderate complexity  EVALUATION COMPLEXITY: Moderate   GOALS: Goals reviewed with patient? Yes  SHORT TERM GOALS: Target date: 02/01/2022  Able to demo upright posture at rest without cuing Baseline: Goal status: INITIAL   LONG TERM GOALS: Target date: 03/16/22  Able to complete 5TST in 12s or less without UE use Baseline:  Goal status:  INITIAL  2.  Able to ambulate safely without use of RW Baseline:  Goal status: INITIAL  3.  Able to pivot/turn in standing without increased hip pain Baseline:  Goal status:  INITIAL  4.  Able to safely navigate stairs, step-over-step and single hand rail use Baseline:  Goal status: INITIAL    PLAN: PT FREQUENCY: 1-2x/week  PT DURATION: 10 weeks  PLANNED INTERVENTIONS: Therapeutic exercises, Therapeutic activity, Neuromuscular re-education, Balance training, Gait training, Patient/Family education, Self Care, Joint mobilization, Stair training, Aquatic Therapy, Dry Needling, Electrical stimulation, Spinal mobilization, Cryotherapy, Moist heat, Taping, Manual therapy, and Re-evaluation.  PLAN FOR NEXT SESSION: continue aquatics- glut and core activation, high stepping to improve clearance, balance tolerance with level pelvis  Kerin Perna, PTA 01/25/22 12:53 PM

## 2022-01-31 ENCOUNTER — Ambulatory Visit (HOSPITAL_BASED_OUTPATIENT_CLINIC_OR_DEPARTMENT_OTHER): Payer: Medicare Other | Admitting: Physical Therapy

## 2022-01-31 ENCOUNTER — Encounter (HOSPITAL_BASED_OUTPATIENT_CLINIC_OR_DEPARTMENT_OTHER): Payer: Self-pay | Admitting: Physical Therapy

## 2022-01-31 DIAGNOSIS — R293 Abnormal posture: Secondary | ICD-10-CM

## 2022-01-31 DIAGNOSIS — M6281 Muscle weakness (generalized): Secondary | ICD-10-CM

## 2022-01-31 DIAGNOSIS — M5459 Other low back pain: Secondary | ICD-10-CM

## 2022-01-31 DIAGNOSIS — R262 Difficulty in walking, not elsewhere classified: Secondary | ICD-10-CM

## 2022-01-31 NOTE — Therapy (Signed)
OUTPATIENT PHYSICAL THERAPY THORACOLUMBAR TREATMENT NOTE   Patient Name: Benjamin Davila MRN: [id number] DOB:1940-09-18, 81 y.o., male Today's Date: 01/31/2022   PT End of Session - 01/31/22 1037     Visit Number 6    Number of Visits 21    Date for PT Re-Evaluation 03/16/22    Authorization Type BCBS MCR    PT Start Time 1030    PT Stop Time 1115    PT Time Calculation (min) 45 min    Activity Tolerance Patient tolerated treatment well    Behavior During Therapy [facility] for tasks assessed/performed              Past Medical History:  Diagnosis Date   Aortic regurgitation 07/14/2020   Aortic stenosis, moderate 03/17/2019   Autoimmune hepatitis ([place]) 07/12/2020   Benign prostatic hyperplasia without lower urinary tract symptoms 07/12/2020   Cardiac murmur 12/05/2018   Diabetes mellitus due to underlying condition with unspecified complications ([place]) 03/50/0938   Essential hypertension 12/05/2018   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   History of pancreatitis 07/12/2020   History of pancytopenia 07/12/2020   Hyperlipidemia, unspecified 07/12/2020   Irregular heart beat 07/12/2020   Long term (current) use of insulin ([place]) 07/12/2020   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   Lumbar radiculopathy 06/17/2020   Lumbar spondylosis 07/28/2020   Malignant neoplasm of prostate ([place]) 06/24/2019   Mood disorder ([place]) 07/12/2020   Pain of left hip joint 08/25/2019   PONV (postoperative nausea and vomiting)    Prostate cancer ([place])    Spinal stenosis of lumbar region 18/29/9371   Systolic murmur 69/67/8938   Trochanteric bursitis of left hip 03/12/2021   Trochanteric bursitis of right hip 03/12/2021   Type 2 diabetes mellitus with other specified complication ([place]) 03/06/5101   Past Surgical History:  Procedure Laterality Date   BACK SURGERY     lumbar surgery 20 years ago per patient   CHOLECYSTECTOMY     20 years ago per patient   LUMBAR LAMINECTOMY/DECOMPRESSION  MICRODISCECTOMY Bilateral 08/09/2021   Procedure: Lumbar decompressive laminectomy  - Lumbar two-Lumabr three - Lumbar three-Lumbar four - Lumbar four-Lumbar five - Lumabr five-Sacal one - Bilateral, Posterior Lateral Fusion Lumbar three-four-Right;  Surgeon: Eustace Moore, MD;  Location: [place];  Service: Neurosurgery;  Laterality: Bilateral;   TONSILLECTOMY     as a kid   Patient Active Problem List   Diagnosis Date Noted   S/P lumbar laminectomy 08/09/2021   Trochanteric bursitis of left hip 03/12/2021   Trochanteric bursitis of right hip 03/12/2021   Lumbar spondylosis 07/28/2020   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   Aortic regurgitation 07/14/2020   Autoimmune hepatitis ([place]) 07/12/2020   Benign prostatic hyperplasia without lower urinary tract symptoms 07/12/2020   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   History of pancreatitis 07/12/2020   History of pancytopenia 07/12/2020   Hyperlipidemia, unspecified 07/12/2020   Irregular heart beat 07/12/2020   Long term (current) use of insulin ([place]) 07/12/2020   Mood disorder ([place]) 58/52/7782   Systolic murmur 42/35/3614   Type 2 diabetes mellitus with other specified complication ([place]) 43/15/4008   Prostate cancer ([place])    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   Lumbar radiculopathy 06/17/2020   Pain of left hip joint 08/25/2019   Malignant neoplasm of prostate ([place]) 06/24/2019   Aortic stenosis, moderate 03/17/2019   Cardiac murmur 12/05/2018   Essential hypertension 12/05/2018   Diabetes mellitus due to underlying condition with unspecified complications ([place]) 67/61/9509    PCP: Jillyn Ledger, FNP  REFERRING PROVIDER: Sherley Bounds, MD  REFERRING DIAG: M54.16 (ICD-10-CM) - Radiculopathy, lumbar region  Rationale for Evaluation and Treatment Rehabilitation  THERAPY DIAG:  Other low back pain  Difficulty in walking, not elsewhere classified  Abnormal posture  Muscle weakness (generalized)  ONSET DATE: Lumbar  Laminectomy & Microdiskectomy on 08/09/21  SUBJECTIVE:                                                                                                                                                                                           SUBJECTIVE STATEMENT: "I don't think anything will help.  I am about the same.  PERTINENT HISTORY:  Chronic hip and LBP, previous PT for Lt hip  PAIN:  Are you having pain? Yes: NPRS scale: 9/10 with walking, 2/10 current when sitting  Pain location: across low back into Lt thigh Pain description: it hurts Aggravating factors: AM pain Relieving factors: used to be sitting but not so much any more   PRECAUTIONS: Fall  WEIGHT BEARING RESTRICTIONS No  FALLS:  Has patient fallen in last 6 months? No  LIVING ENVIRONMENT: Stairs at home- steps to get in from garage with hand rails, stairs in home with bedroom downstairs  OCCUPATION: retired  PLOF: Independent  PATIENT GOALS turning, get up and walk   OBJECTIVE:  Findings taken at evaluation unless otherwise noted.     SCREENING FOR RED FLAGS: No findings  COGNITION:  Overall cognitive status: Within functional limits for tasks assessed     SENSATION: WFL  MUSCLE LENGTH: General tightness  POSTURE: posterior pelvic tilt, pelvis drifted anteriorly to shoulders with weight in anterior portion of feet.  Ambulates with flexed posture, short step length and flat foot strike; rolling walker outside of home   LUMBAR ROM:   Active  A/PROM  eval  Flexion To knees  Extension Slightly past neutral  Right lateral flexion   Left lateral flexion   Right rotation   Left rotation    (Blank rows = not tested)   LOWER EXTREMITY MMT:    Grossly able to lift against gravity but requires use of hands to get out of a chair     TODAY'S TREATMENT  Pt seen for aquatic therapy today.  Treatment took place in water 3.25-4.5 ft in depth at the [facility]. Temp of water was  91.  Pt entered/exited the pool via stairs independently with bilat rail.  -forward / backward walking, side stepping  -yellow hand buoys submerged at sides for core engagement forward and backward amb 22f - holding wall: hip abdct/ add; hip ext; hip openers; curtsy (all four exercises alternating), heel  raises, squats  - high knee marching forward/ backward - fig 4 position stretch holding rails, R/L - hamstring stretch with foot on 3rd step  -gastroc stretch on bottom step - side to side lunge for adductor stretch holding rails   Pt requires the buoyancy and hydrostatic pressure of water for support, and to offload joints by unweighting joint load by at least 50 % in navel deep water and by at least 75-80% in chest to neck deep water.  Viscosity of the water is needed for resistance of strengthening. Water current perturbations provides challenge to standing balance requiring increased core activation.    PATIENT EDUCATION:  Education details: exercise form/rationale Person educated: Patient and Spouse Education method: Explanation, Demonstration, Tactile cues, Verbal cues, and Handouts Education comprehension: verbalized understanding, returned demonstration, verbal cues required, tactile cues required, and needs further education   HOME EXERCISE PROGRAM: FPFNLKEK      avoid sitting longer than 45 min at a time  ASSESSMENT:  CLINICAL IMPRESSION: Pt with difficulty coordinating most exercises and stretches.  He continues with severe ms tightness throughout lumbopelvic area as well as le. He requires maximal VC and repeated demonstration for execution of engagements.  Visual improvements noted with cadence in amb in and out of pool athough he continues to complain of high pain 8-9/10 when amb distance of>5 minutes. Continues to require vc for upright posture. He reports compliance with HEP as instructed.   OBJECTIVE IMPAIRMENTS Abnormal gait, decreased activity tolerance, decreased  balance, decreased mobility, difficulty walking, decreased strength, increased muscle spasms, impaired flexibility, improper body mechanics, postural dysfunction, and pain.   ACTIVITY LIMITATIONS carrying, lifting, bending, sitting, standing, stairs, transfers, and locomotion level  PARTICIPATION LIMITATIONS: meal prep, cleaning, laundry, shopping, community activity, and yard work  PERSONAL FACTORS Past/current experiences, Time since onset of injury/illness/exacerbation, and 1-2 comorbidities: trochanteric bursitis, progression of ambulation limitations  are also affecting patient's functional outcome.   REHAB POTENTIAL: Good  CLINICAL DECISION MAKING: Evolving/moderate complexity  EVALUATION COMPLEXITY: Moderate   GOALS: Goals reviewed with patient? Yes  SHORT TERM GOALS: Target date: 02/01/2022  Able to demo upright posture at rest without cuing Baseline: Goal status: ongoing   LONG TERM GOALS: Target date: 03/16/22  Able to complete 5TST in 12s or less without UE use Baseline:  Goal status: INITIAL  2.  Able to ambulate safely without use of RW Baseline:  Goal status: INITIAL  3.  Able to pivot/turn in standing without increased hip pain Baseline:  Goal status: INITIAL  4.  Able to safely navigate stairs, step-over-step and single hand rail use Baseline:  Goal status: INITIAL    PLAN: PT FREQUENCY: 1-2x/week  PT DURATION: 10 weeks  PLANNED INTERVENTIONS: Therapeutic exercises, Therapeutic activity, Neuromuscular re-education, Balance training, Gait training, Patient/Family education, Self Care, Joint mobilization, Stair training, Aquatic Therapy, Dry Needling, Electrical stimulation, Spinal mobilization, Cryotherapy, Moist heat, Taping, Manual therapy, and Re-evaluation.  PLAN FOR NEXT SESSION: continue aquatics- glut and core activation, high stepping to improve clearance, balance tolerance with level pelvis [facility]) Mariesha Venturella MPT 01/31/22 10:40 AM

## 2022-02-05 ENCOUNTER — Ambulatory Visit (HOSPITAL_BASED_OUTPATIENT_CLINIC_OR_DEPARTMENT_OTHER): Payer: Medicare Other | Admitting: Physical Therapy

## 2022-02-05 ENCOUNTER — Encounter (HOSPITAL_BASED_OUTPATIENT_CLINIC_OR_DEPARTMENT_OTHER): Payer: Self-pay | Admitting: Physical Therapy

## 2022-02-05 DIAGNOSIS — R262 Difficulty in walking, not elsewhere classified: Secondary | ICD-10-CM

## 2022-02-05 DIAGNOSIS — M6281 Muscle weakness (generalized): Secondary | ICD-10-CM

## 2022-02-05 DIAGNOSIS — M5459 Other low back pain: Secondary | ICD-10-CM | POA: Diagnosis not present

## 2022-02-05 DIAGNOSIS — R293 Abnormal posture: Secondary | ICD-10-CM

## 2022-02-05 NOTE — Therapy (Signed)
OUTPATIENT PHYSICAL THERAPY THORACOLUMBAR TREATMENT NOTE   Patient Name: Benjamin Davila MRN: [id number] DOB:02-26-1941, 81 y.o., male Today's Date: 02/05/2022   PT End of Session - 02/05/22 1302     Visit Number 7    Number of Visits 21    Date for PT Re-Evaluation 03/16/22    Authorization Type BCBS MCR    PT Start Time 1300    PT Stop Time 1332    PT Time Calculation (min) 32 min    Activity Tolerance Patient tolerated treatment well;Patient limited by pain    Behavior During Therapy [facility] for tasks assessed/performed               Past Medical History:  Diagnosis Date   Aortic regurgitation 07/14/2020   Aortic stenosis, moderate 03/17/2019   Autoimmune hepatitis ([place]) 07/12/2020   Benign prostatic hyperplasia without lower urinary tract symptoms 07/12/2020   Cardiac murmur 12/05/2018   Diabetes mellitus due to underlying condition with unspecified complications ([place]) 38/18/2993   Essential hypertension 12/05/2018   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   History of pancreatitis 07/12/2020   History of pancytopenia 07/12/2020   Hyperlipidemia, unspecified 07/12/2020   Irregular heart beat 07/12/2020   Long term (current) use of insulin ([place]) 07/12/2020   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   Lumbar radiculopathy 06/17/2020   Lumbar spondylosis 07/28/2020   Malignant neoplasm of prostate ([place]) 06/24/2019   Mood disorder ([place]) 07/12/2020   Pain of left hip joint 08/25/2019   PONV (postoperative nausea and vomiting)    Prostate cancer ([place])    Spinal stenosis of lumbar region 71/69/6789   Systolic murmur 38/02/1750   Trochanteric bursitis of left hip 03/12/2021   Trochanteric bursitis of right hip 03/12/2021   Type 2 diabetes mellitus with other specified complication ([place]) 02/58/5277   Past Surgical History:  Procedure Laterality Date   BACK SURGERY     lumbar surgery 20 years ago per patient   CHOLECYSTECTOMY     20 years ago per patient   LUMBAR  LAMINECTOMY/DECOMPRESSION MICRODISCECTOMY Bilateral 08/09/2021   Procedure: Lumbar decompressive laminectomy  - Lumbar two-Lumabr three - Lumbar three-Lumbar four - Lumbar four-Lumbar five - Lumabr five-Sacal one - Bilateral, Posterior Lateral Fusion Lumbar three-four-Right;  Surgeon: Eustace Moore, MD;  Location: [facility];  Service: Neurosurgery;  Laterality: Bilateral;   TONSILLECTOMY     as a kid   Patient Active Problem List   Diagnosis Date Noted   S/P lumbar laminectomy 08/09/2021   Trochanteric bursitis of left hip 03/12/2021   Trochanteric bursitis of right hip 03/12/2021   Lumbar spondylosis 07/28/2020   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   Aortic regurgitation 07/14/2020   Autoimmune hepatitis ([place]) 07/12/2020   Benign prostatic hyperplasia without lower urinary tract symptoms 07/12/2020   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   History of pancreatitis 07/12/2020   History of pancytopenia 07/12/2020   Hyperlipidemia, unspecified 07/12/2020   Irregular heart beat 07/12/2020   Long term (current) use of insulin ([place]) 07/12/2020   Mood disorder ([place]) 82/42/3536   Systolic murmur 14/43/1540   Type 2 diabetes mellitus with other specified complication ([place]) 08/67/6195   Prostate cancer ([place])    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   Lumbar radiculopathy 06/17/2020   Pain of left hip joint 08/25/2019   Malignant neoplasm of prostate ([place]) 06/24/2019   Aortic stenosis, moderate 03/17/2019   Cardiac murmur 12/05/2018   Essential hypertension 12/05/2018   Diabetes mellitus due to underlying condition with unspecified complications ([place]) 09/32/6712  PCP: Jillyn Ledger, FNP  REFERRING PROVIDER: Sherley Bounds, MD  REFERRING DIAG: [phone] (ICD-10-CM) - Radiculopathy, lumbar region  Rationale for Evaluation and Treatment Rehabilitation  THERAPY DIAG:  Other low back pain  Difficulty in walking, not elsewhere classified  Abnormal posture  Muscle weakness  (generalized)  ONSET DATE: Lumbar Laminectomy & Microdiskectomy on 08/09/21  SUBJECTIVE:                                                                                                                                                                                           SUBJECTIVE STATEMENT: I am getting worse. I am seeing the surgeon tomorrow. I am still limping, in great pain and waking at night. Burning and pain lateral Lt hip, down leg, around knee- inconsistent where down the leg.   PERTINENT HISTORY:  Chronic hip and LBP, previous PT for Lt hip  PAIN:  Are you having pain? Yes: NPRS scale: 9/10 with walking, 2/10 current when sitting  Pain location: across low back into Lt thigh Pain description: it hurts Aggravating factors: AM pain Relieving factors: used to be sitting but not so much any more   PRECAUTIONS: Fall  WEIGHT BEARING RESTRICTIONS No  FALLS:  Has patient fallen in last 6 months? No  LIVING ENVIRONMENT: Stairs at home- steps to get in from garage with hand rails, stairs in home with bedroom downstairs  OCCUPATION: retired  PLOF: Independent  PATIENT GOALS turning, get up and walk   OBJECTIVE:  Findings taken at evaluation unless otherwise noted.   MUSCLE LENGTH: General tightness  POSTURE: posterior pelvic tilt, pelvis drifted anteriorly to shoulders with weight in anterior portion of feet.  Ambulates with flexed posture, short step length and flat foot strike; rolling walker outside of home   LUMBAR ROM:   Active  A/PROM  eval  Flexion To knees  Extension Slightly past neutral  Right lateral flexion   Left lateral flexion   Right rotation   Left rotation    (Blank rows = not tested)   LOWER EXTREMITY MMT:    Grossly able to lift against gravity but requires use of hands to get out of a chair    TODAY'S TREATMENT  MANUAL: STM to Rt hip glut med, min, TFL; passive hamstring stretch Reviewed seated Hamstring stretch.  Reveiwed  stretching vs hurt Went through current printouts and highlighted ones to keep doing.     PATIENT EDUCATION:  Education details: exercise form/rationale Person educated: Patient and Spouse Education method: Explanation, Demonstration, Tactile cues, Verbal cues, and Handouts Education comprehension: verbalized understanding, returned demonstration, verbal cues required, tactile cues required, and needs further education  HOME EXERCISE PROGRAM: [id number]      avoid sitting longer than 45 min at a time  ASSESSMENT:  CLINICAL IMPRESSION: Tightness in glut med and min- pain consistent with glut med tearing but I do not have access to images that were taken of the hip- I asked him to discuss this with MD he is seeing tomorrow.    OBJECTIVE IMPAIRMENTS Abnormal gait, decreased activity tolerance, decreased balance, decreased mobility, difficulty walking, decreased strength, increased muscle spasms, impaired flexibility, improper body mechanics, postural dysfunction, and pain.   ACTIVITY LIMITATIONS carrying, lifting, bending, sitting, standing, stairs, transfers, and locomotion level  PARTICIPATION LIMITATIONS: meal prep, cleaning, laundry, shopping, community activity, and yard work  PERSONAL FACTORS Past/current experiences, Time since onset of injury/illness/exacerbation, and 1-2 comorbidities: trochanteric bursitis, progression of ambulation limitations  are also affecting patient's functional outcome.   REHAB POTENTIAL: Good  CLINICAL DECISION MAKING: Evolving/moderate complexity  EVALUATION COMPLEXITY: Moderate   GOALS: Goals reviewed with patient? Yes  SHORT TERM GOALS: Target date: 02/01/2022  Able to demo upright posture at rest without cuing Baseline: Goal status: ongoing   LONG TERM GOALS: Target date: 03/16/22  Able to complete 5TST in 12s or less without UE use Baseline:  Goal status: INITIAL  2.  Able to ambulate safely without use of RW Baseline:  Goal  status: INITIAL  3.  Able to pivot/turn in standing without increased hip pain Baseline:  Goal status: INITIAL  4.  Able to safely navigate stairs, step-over-step and single hand rail use Baseline:  Goal status: INITIAL    PLAN: PT FREQUENCY: 1-2x/week  PT DURATION: 10 weeks  PLANNED INTERVENTIONS: Therapeutic exercises, Therapeutic activity, Neuromuscular re-education, Balance training, Gait training, Patient/Family education, Self Care, Joint mobilization, Stair training, Aquatic Therapy, Dry Needling, Electrical stimulation, Spinal mobilization, Cryotherapy, Moist heat, Taping, Manual therapy, and Re-evaluation.  PLAN FOR NEXT SESSION: outcome of MD visit?  Emmah Bratcher C. Kiandre Spagnolo PT, DPT 02/05/22 1:36 PM

## 2022-02-08 ENCOUNTER — Ambulatory Visit (HOSPITAL_BASED_OUTPATIENT_CLINIC_OR_DEPARTMENT_OTHER): Payer: Medicare Other | Admitting: Physical Therapy

## 2022-02-08 ENCOUNTER — Encounter (HOSPITAL_BASED_OUTPATIENT_CLINIC_OR_DEPARTMENT_OTHER): Payer: Self-pay | Admitting: Physical Therapy

## 2022-02-08 DIAGNOSIS — M6281 Muscle weakness (generalized): Secondary | ICD-10-CM

## 2022-02-08 DIAGNOSIS — M25552 Pain in left hip: Secondary | ICD-10-CM

## 2022-02-08 DIAGNOSIS — M5459 Other low back pain: Secondary | ICD-10-CM

## 2022-02-08 DIAGNOSIS — R262 Difficulty in walking, not elsewhere classified: Secondary | ICD-10-CM

## 2022-02-08 NOTE — Therapy (Signed)
OUTPATIENT PHYSICAL THERAPY THORACOLUMBAR TREATMENT NOTE   Patient Name: Benjamin Davila MRN: [id number] DOB:Mar 14, 1941, 81 y.o., male Today's Date: 02/08/2022   PT End of Session - 02/08/22 1042     Visit Number 8    Number of Visits 21    Date for PT Re-Evaluation 03/16/22    Authorization Type BCBS MCR    Progress Note Due on Visit 10    PT Start Time 0946    PT Stop Time 1030    PT Time Calculation (min) 44 min    Activity Tolerance Patient tolerated treatment well;Patient limited by pain    Behavior During Therapy [facility] for tasks assessed/performed                Past Medical History:  Diagnosis Date   Aortic regurgitation 07/14/2020   Aortic stenosis, moderate 03/17/2019   Autoimmune hepatitis ([place]) 07/12/2020   Benign prostatic hyperplasia without lower urinary tract symptoms 07/12/2020   Cardiac murmur 12/05/2018   Diabetes mellitus due to underlying condition with unspecified complications ([place]) 02/40/9735   Essential hypertension 12/05/2018   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   History of pancreatitis 07/12/2020   History of pancytopenia 07/12/2020   Hyperlipidemia, unspecified 07/12/2020   Irregular heart beat 07/12/2020   Long term (current) use of insulin ([place]) 07/12/2020   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   Lumbar radiculopathy 06/17/2020   Lumbar spondylosis 07/28/2020   Malignant neoplasm of prostate ([place]) 06/24/2019   Mood disorder ([place]) 07/12/2020   Pain of left hip joint 08/25/2019   PONV (postoperative nausea and vomiting)    Prostate cancer ([place])    Spinal stenosis of lumbar region 32/99/2426   Systolic murmur 83/41/9622   Trochanteric bursitis of left hip 03/12/2021   Trochanteric bursitis of right hip 03/12/2021   Type 2 diabetes mellitus with other specified complication ([place]) 29/79/8921   Past Surgical History:  Procedure Laterality Date   BACK SURGERY     lumbar surgery 20 years ago per patient   CHOLECYSTECTOMY     20 years  ago per patient   LUMBAR LAMINECTOMY/DECOMPRESSION MICRODISCECTOMY Bilateral 08/09/2021   Procedure: Lumbar decompressive laminectomy  - Lumbar two-Lumabr three - Lumbar three-Lumbar four - Lumbar four-Lumbar five - Lumabr five-Sacal one - Bilateral, Posterior Lateral Fusion Lumbar three-four-Right;  Surgeon: Eustace Moore, MD;  Location: [facility];  Service: Neurosurgery;  Laterality: Bilateral;   TONSILLECTOMY     as a kid   Patient Active Problem List   Diagnosis Date Noted   S/P lumbar laminectomy 08/09/2021   Trochanteric bursitis of left hip 03/12/2021   Trochanteric bursitis of right hip 03/12/2021   Lumbar spondylosis 07/28/2020   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   Aortic regurgitation 07/14/2020   Autoimmune hepatitis ([place]) 07/12/2020   Benign prostatic hyperplasia without lower urinary tract symptoms 07/12/2020   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   History of pancreatitis 07/12/2020   History of pancytopenia 07/12/2020   Hyperlipidemia, unspecified 07/12/2020   Irregular heart beat 07/12/2020   Long term (current) use of insulin ([place]) 07/12/2020   Mood disorder ([place]) 19/41/7408   Systolic murmur 14/48/1856   Type 2 diabetes mellitus with other specified complication ([place]) 31/49/7026   Prostate cancer ([place])    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   Lumbar radiculopathy 06/17/2020   Pain of left hip joint 08/25/2019   Malignant neoplasm of prostate ([place]) 06/24/2019   Aortic stenosis, moderate 03/17/2019   Cardiac murmur 12/05/2018   Essential hypertension 12/05/2018   Diabetes mellitus due to  underlying condition with unspecified complications ([place]) 16/02/9603    PCP: Jillyn Ledger, FNP  REFERRING PROVIDER: Sherley Bounds, MD  REFERRING DIAG: M54.16 (ICD-10-CM) - Radiculopathy, lumbar region  Rationale for Evaluation and Treatment Rehabilitation  THERAPY DIAG:  Other low back pain  Difficulty in walking, not elsewhere classified  Muscle weakness  (generalized)  Pain in left hip  ONSET DATE: Lumbar Laminectomy & Microdiskectomy on 08/09/21  SUBJECTIVE:                                                                                                                                                                                           SUBJECTIVE STATEMENT: "I woke up early today with burning pain in my left buttocks and hip. They started me on gabapentin at night"   PERTINENT HISTORY:  Chronic hip and LBP, previous PT for Lt hip  PAIN:  Are you having pain? Yes: NPRS scale: 9/10 with walking, 2/10 current when sitting  Pain location: across low back into Lt thigh Pain description: it hurts Aggravating factors: AM pain Relieving factors: used to be sitting but not so much any more   PRECAUTIONS: Fall  WEIGHT BEARING RESTRICTIONS No  FALLS:  Has patient fallen in last 6 months? No  LIVING ENVIRONMENT: Stairs at home- steps to get in from garage with hand rails, stairs in home with bedroom downstairs  OCCUPATION: retired  PLOF: Independent  PATIENT GOALS turning, get up and walk   OBJECTIVE:  Findings taken at evaluation unless otherwise noted.   MUSCLE LENGTH: General tightness  POSTURE: posterior pelvic tilt, pelvis drifted anteriorly to shoulders with weight in anterior portion of feet.  Ambulates with flexed posture, short step length and flat foot strike; rolling walker outside of home   LUMBAR ROM:   Active  A/PROM  eval  Flexion To knees  Extension Slightly past neutral  Right lateral flexion   Left lateral flexion   Right rotation   Left rotation    (Blank rows = not tested)   LOWER EXTREMITY MMT:    Grossly able to lift against gravity but requires use of hands to get out of a chair    TODAY'S TREATMENT  Pt seen for aquatic therapy today.  Treatment took place in water 3.25-4.5 ft in depth at the [facility]. Temp of water was 91.  Pt entered/exited the pool via stairs  independently with bilat rail.   -forward / backward walking, side stepping  -yellow hand buoys submerged at sides for core engagement forward and backward amb 11f - holding wall: hip abdct/ add; hip ext; hip openers: heel raises: squats x12 4.321f-SL  squat 40f R/L x10 - fig 4 position stretch holding rails, R/L (not tolerated well, L glut discomfort) - hamstring stretch with foot on 3rd step multiple reps -gastroc stretch on bottom step            -walking for recovery backwards throughout session for increased post core engagement. Pt requires the buoyancy and hydrostatic pressure of water for support, and to offload joints by unweighting joint load by at least 50 % in navel deep water and by at least 75-80% in chest to neck deep water.  Viscosity of the water is needed for resistance of strengthening. Water current perturbations provides challenge to standing balance requiring increased core activation.     MANUAL: STM to Rt hip glut med, min, TFL; passive hamstring stretch Reviewed seated Hamstring stretch.  Reveiwed stretching vs hurt Went through current printouts and highlighted ones to keep doing.     PATIENT EDUCATION:  Education details: exercise form/rationale Person educated: Patient and Spouse Education method: Explanation, Demonstration, Tactile cues, Verbal cues, and Handouts Education comprehension: verbalized understanding, returned demonstration, verbal cues required, tactile cues required, and needs further education   HOME EXERCISE PROGRAM: FPFNLKEK      avoid sitting longer than 45 min at a time  ASSESSMENT:  CLINICAL IMPRESSION: Spine MD referred him back to ortho Dr OAlvan Dameappointment 10/9 to assess hip and possible glut med tear. Left glut/hamstring and gastroc stretching noting tightness throughout. Strengthening with focus on glut strength.  Pt reports discomfort consistently through left post hip area.  Modified depths we are working to decrease/eliminate.  He does exit pool using alternating le pattern holding to hand rails    OBJECTIVE IMPAIRMENTS Abnormal gait, decreased activity tolerance, decreased balance, decreased mobility, difficulty walking, decreased strength, increased muscle spasms, impaired flexibility, improper body mechanics, postural dysfunction, and pain.   ACTIVITY LIMITATIONS carrying, lifting, bending, sitting, standing, stairs, transfers, and locomotion level  PARTICIPATION LIMITATIONS: meal prep, cleaning, laundry, shopping, community activity, and yard work  PERSONAL FACTORS Past/current experiences, Time since onset of injury/illness/exacerbation, and 1-2 comorbidities: trochanteric bursitis, progression of ambulation limitations  are also affecting patient's functional outcome.   REHAB POTENTIAL: Good  CLINICAL DECISION MAKING: Evolving/moderate complexity  EVALUATION COMPLEXITY: Moderate   GOALS: Goals reviewed with patient? Yes  SHORT TERM GOALS: Target date: 02/01/2022  Able to demo upright posture at rest without cuing Baseline: Goal status: ongoing   LONG TERM GOALS: Target date: 03/16/22  Able to complete 5TST in 12s or less without UE use Baseline:  Goal status: INITIAL  2.  Able to ambulate safely without use of RW Baseline:  Goal status: INITIAL  3.  Able to pivot/turn in standing without increased hip pain Baseline:  Goal status: INITIAL  4.  Able to safely navigate stairs, step-over-step and single hand rail use Baseline:  Goal status: INITIAL    PLAN: PT FREQUENCY: 1-2x/week  PT DURATION: 10 weeks  PLANNED INTERVENTIONS: Therapeutic exercises, Therapeutic activity, Neuromuscular re-education, Balance training, Gait training, Patient/Family education, Self Care, Joint mobilization, Stair training, Aquatic Therapy, Dry Needling, Electrical stimulation, Spinal mobilization, Cryotherapy, Moist heat, Taping, Manual therapy, and Re-evaluation.  PLAN FOR NEXT SESSION: outcome of MD  visit?  MStanton Kidney(Tharon Aquas Caran Storck MPT 02/08/22 10:43 AM

## 2022-02-12 ENCOUNTER — Ambulatory Visit (HOSPITAL_BASED_OUTPATIENT_CLINIC_OR_DEPARTMENT_OTHER): Payer: Medicare Other | Admitting: Physical Therapy

## 2022-02-12 ENCOUNTER — Encounter (HOSPITAL_BASED_OUTPATIENT_CLINIC_OR_DEPARTMENT_OTHER): Payer: Self-pay | Admitting: Physical Therapy

## 2022-02-12 DIAGNOSIS — M6281 Muscle weakness (generalized): Secondary | ICD-10-CM

## 2022-02-12 DIAGNOSIS — M5459 Other low back pain: Secondary | ICD-10-CM

## 2022-02-12 DIAGNOSIS — R262 Difficulty in walking, not elsewhere classified: Secondary | ICD-10-CM

## 2022-02-12 DIAGNOSIS — M25552 Pain in left hip: Secondary | ICD-10-CM

## 2022-02-12 NOTE — Therapy (Signed)
OUTPATIENT PHYSICAL THERAPY THORACOLUMBAR TREATMENT NOTE   Patient Name: Benjamin Davila MRN: [id number] DOB:28-Aug-1940, 81 y.o., male Today's Date: 02/12/2022   PT End of Session - 02/12/22 1019     Visit Number 9    Number of Visits 21    Date for PT Re-Evaluation 03/16/22    Authorization Type BCBS MCR    Progress Note Due on Visit 10    PT Start Time 1018    PT Stop Time 1058    PT Time Calculation (min) 40 min    Activity Tolerance Patient tolerated treatment well;Patient limited by pain    Behavior During Therapy [facility] for tasks assessed/performed                 Past Medical History:  Diagnosis Date   Aortic regurgitation 07/14/2020   Aortic stenosis, moderate 03/17/2019   Autoimmune hepatitis ([place]) 07/12/2020   Benign prostatic hyperplasia without lower urinary tract symptoms 07/12/2020   Cardiac murmur 12/05/2018   Diabetes mellitus due to underlying condition with unspecified complications ([place]) 20/94/7096   Essential hypertension 12/05/2018   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   History of pancreatitis 07/12/2020   History of pancytopenia 07/12/2020   Hyperlipidemia, unspecified 07/12/2020   Irregular heart beat 07/12/2020   Long term (current) use of insulin ([place]) 07/12/2020   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   Lumbar radiculopathy 06/17/2020   Lumbar spondylosis 07/28/2020   Malignant neoplasm of prostate ([place]) 06/24/2019   Mood disorder ([place]) 07/12/2020   Pain of left hip joint 08/25/2019   PONV (postoperative nausea and vomiting)    Prostate cancer ([place])    Spinal stenosis of lumbar region 28/36/6294   Systolic murmur 76/54/6503   Trochanteric bursitis of left hip 03/12/2021   Trochanteric bursitis of right hip 03/12/2021   Type 2 diabetes mellitus with other specified complication ([place]) 54/65/6812   Past Surgical History:  Procedure Laterality Date   BACK SURGERY     lumbar surgery 20 years ago per patient   CHOLECYSTECTOMY     20 years  ago per patient   LUMBAR LAMINECTOMY/DECOMPRESSION MICRODISCECTOMY Bilateral 08/09/2021   Procedure: Lumbar decompressive laminectomy  - Lumbar two-Lumabr three - Lumbar three-Lumbar four - Lumbar four-Lumbar five - Lumabr five-Sacal one - Bilateral, Posterior Lateral Fusion Lumbar three-four-Right;  Surgeon: Eustace Moore, MD;  Location: [place];  Service: Neurosurgery;  Laterality: Bilateral;   TONSILLECTOMY     as a kid   Patient Active Problem List   Diagnosis Date Noted   S/P lumbar laminectomy 08/09/2021   Trochanteric bursitis of left hip 03/12/2021   Trochanteric bursitis of right hip 03/12/2021   Lumbar spondylosis 07/28/2020   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   Aortic regurgitation 07/14/2020   Autoimmune hepatitis ([place]) 07/12/2020   Benign prostatic hyperplasia without lower urinary tract symptoms 07/12/2020   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   History of pancreatitis 07/12/2020   History of pancytopenia 07/12/2020   Hyperlipidemia, unspecified 07/12/2020   Irregular heart beat 07/12/2020   Long term (current) use of insulin ([place]) 07/12/2020   Mood disorder ([place]) 75/17/0017   Systolic murmur 49/44/9675   Type 2 diabetes mellitus with other specified complication ([place]) 91/63/8466   Prostate cancer ([place])    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   Lumbar radiculopathy 06/17/2020   Pain of left hip joint 08/25/2019   Malignant neoplasm of prostate ([place]) 06/24/2019   Aortic stenosis, moderate 03/17/2019   Cardiac murmur 12/05/2018   Essential hypertension 12/05/2018   Diabetes mellitus due  to underlying condition with unspecified complications ([place]) 31/51/7616    PCP: Jillyn Ledger, FNP  REFERRING PROVIDER: Sherley Bounds, MD  REFERRING DIAG: M54.16 (ICD-10-CM) - Radiculopathy, lumbar region  Rationale for Evaluation and Treatment Rehabilitation  THERAPY DIAG:  Other low back pain  Difficulty in walking, not elsewhere classified  Muscle weakness  (generalized)  Pain in left hip  ONSET DATE: Lumbar Laminectomy & Microdiskectomy on 08/09/21  SUBJECTIVE:                                                                                                                                                                                           SUBJECTIVE STATEMENT: I dont think I am going to get better. Going to see olin 10/9. Not consistent with doing exercise for regular mobility.   PERTINENT HISTORY:  Chronic hip and LBP, previous PT for Lt hip  PAIN:  Are you having pain? Yes: NPRS scale: 9/10 with walking, 2/10 current when sitting  Pain location: across low back into Lt thigh Pain description: it hurts Aggravating factors: AM pain Relieving factors: used to be sitting but not so much any more   PRECAUTIONS: Fall  WEIGHT BEARING RESTRICTIONS No  FALLS:  Has patient fallen in last 6 months? No  LIVING ENVIRONMENT: Stairs at home- steps to get in from garage with hand rails, stairs in home with bedroom downstairs  OCCUPATION: retired  PLOF: Independent  PATIENT GOALS turning, get up and walk   OBJECTIVE:  Findings taken at evaluation unless otherwise noted.   MUSCLE LENGTH: General tightness  POSTURE: posterior pelvic tilt, pelvis drifted anteriorly to shoulders with weight in anterior portion of feet.  Ambulates with flexed posture, short step length and flat foot strike; rolling walker outside of home   LUMBAR ROM:   Active  A/PROM  eval  Flexion To knees  Extension Slightly past neutral  Right lateral flexion   Left lateral flexion   Right rotation   Left rotation    (Blank rows = not tested)   LOWER EXTREMITY MMT:    Grossly able to lift against gravity but requires use of hands to get out of a chair    TODAY'S TREATMENT  MANUAL: STM Lt hip, Lt LAD, bil passive HS stretching Bike 5 min L3 Leg press hip width & wide 2*25 Standing straight arm pull down- bil and single Heel raises   PATIENT  EDUCATION:  Education details: exercise form/rationale Person educated: Patient and Spouse Education method: Explanation, Demonstration, Tactile cues, Verbal cues, and Handouts Education comprehension: verbalized understanding, returned demonstration, verbal cues required, tactile cues required, and needs further education   HOME  EXERCISE PROGRAM: FPFNLKEK      avoid sitting longer than 45 min at a time  ASSESSMENT:  CLINICAL IMPRESSION:  Increased trendelenburg with fatigue. Explained anatomy of condition and rationale for though process on glut med tear but he continues to report he is very confused and frustrated. Asked about a spinal stimulator and taking the gabapentin. He will try the gabapentin as rx and see dr Alvan Dame before considering stimulator.    OBJECTIVE IMPAIRMENTS Abnormal gait, decreased activity tolerance, decreased balance, decreased mobility, difficulty walking, decreased strength, increased muscle spasms, impaired flexibility, improper body mechanics, postural dysfunction, and pain.   ACTIVITY LIMITATIONS carrying, lifting, bending, sitting, standing, stairs, transfers, and locomotion level  PARTICIPATION LIMITATIONS: meal prep, cleaning, laundry, shopping, community activity, and yard work  PERSONAL FACTORS Past/current experiences, Time since onset of injury/illness/exacerbation, and 1-2 comorbidities: trochanteric bursitis, progression of ambulation limitations  are also affecting patient's functional outcome.   REHAB POTENTIAL: Good  CLINICAL DECISION MAKING: Evolving/moderate complexity  EVALUATION COMPLEXITY: Moderate   GOALS: Goals reviewed with patient? Yes  SHORT TERM GOALS: Target date: 02/01/2022  Able to demo upright posture at rest without cuing Baseline: Goal status: ongoing   LONG TERM GOALS: Target date: 03/16/22  Able to complete 5TST in 12s or less without UE use Baseline:  Goal status: INITIAL  2.  Able to ambulate safely without  use of RW Baseline:  Goal status: INITIAL  3.  Able to pivot/turn in standing without increased hip pain Baseline:  Goal status: INITIAL  4.  Able to safely navigate stairs, step-over-step and single hand rail use Baseline:  Goal status: INITIAL    PLAN: PT FREQUENCY: 1-2x/week  PT DURATION: 10 weeks  PLANNED INTERVENTIONS: Therapeutic exercises, Therapeutic activity, Neuromuscular re-education, Balance training, Gait training, Patient/Family education, Self Care, Joint mobilization, Stair training, Aquatic Therapy, Dry Needling, Electrical stimulation, Spinal mobilization, Cryotherapy, Moist heat, Taping, Manual therapy, and Re-evaluation.  PLAN FOR NEXT SESSION: general mobility and postural strengthening  Analysia Dungee C. Tu Shimmel PT, DPT 02/12/22 11:00 AM

## 2022-02-15 ENCOUNTER — Ambulatory Visit (HOSPITAL_BASED_OUTPATIENT_CLINIC_OR_DEPARTMENT_OTHER): Payer: Medicare Other | Admitting: Physical Therapy

## 2022-02-15 ENCOUNTER — Encounter (HOSPITAL_BASED_OUTPATIENT_CLINIC_OR_DEPARTMENT_OTHER): Payer: Self-pay | Admitting: Physical Therapy

## 2022-02-15 DIAGNOSIS — R262 Difficulty in walking, not elsewhere classified: Secondary | ICD-10-CM

## 2022-02-15 DIAGNOSIS — M25552 Pain in left hip: Secondary | ICD-10-CM

## 2022-02-15 DIAGNOSIS — M5459 Other low back pain: Secondary | ICD-10-CM | POA: Diagnosis not present

## 2022-02-15 DIAGNOSIS — M6281 Muscle weakness (generalized): Secondary | ICD-10-CM

## 2022-02-15 NOTE — Therapy (Signed)
OUTPATIENT PHYSICAL THERAPY THORACOLUMBAR TREATMENT NOTE  Progress Note Reporting Period 01/03/22 to 02/15/22  See note below for Objective Data and Assessment of Progress/Goals.     Patient Name: Benjamin Davila MRN: [id number] DOB:[birth year], 81 y.o., male Today's Date: 02/15/2022   PT End of Session - 02/15/22 1011     Visit Number 10    Number of Visits 21    Date for PT Re-Evaluation 03/16/22    Authorization Type BCBS MCR    Progress Note Due on Visit 10    PT Start Time 0950    PT Stop Time 1030    PT Time Calculation (min) 40 min    Activity Tolerance Patient tolerated treatment well;Patient limited by pain    Behavior During Therapy [facility] for tasks assessed/performed                 Past Medical History:  Diagnosis Date   Aortic regurgitation 07/14/2020   Aortic stenosis, moderate 03/17/2019   Autoimmune hepatitis ([place]) 07/12/2020   Benign prostatic hyperplasia without lower urinary tract symptoms 07/12/2020   Cardiac murmur 12/05/2018   Diabetes mellitus due to underlying condition with unspecified complications ([place]) 29/56/2130   Essential hypertension 12/05/2018   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   History of pancreatitis 07/12/2020   History of pancytopenia 07/12/2020   Hyperlipidemia, unspecified 07/12/2020   Irregular heart beat 07/12/2020   Long term (current) use of insulin ([place]) 07/12/2020   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   Lumbar radiculopathy 06/17/2020   Lumbar spondylosis 07/28/2020   Malignant neoplasm of prostate ([place]) 06/24/2019   Mood disorder ([place]) 07/12/2020   Pain of left hip joint 08/25/2019   PONV (postoperative nausea and vomiting)    Prostate cancer ([place])    Spinal stenosis of lumbar region 86/57/8469   Systolic murmur 62/95/2841   Trochanteric bursitis of left hip 03/12/2021   Trochanteric bursitis of right hip 03/12/2021   Type 2 diabetes mellitus with other specified complication ([place]) 32/44/0102   Past Surgical  History:  Procedure Laterality Date   BACK SURGERY     lumbar surgery 20 years ago per patient   CHOLECYSTECTOMY     20 years ago per patient   LUMBAR LAMINECTOMY/DECOMPRESSION MICRODISCECTOMY Bilateral 08/09/2021   Procedure: Lumbar decompressive laminectomy  - Lumbar two-Lumabr three - Lumbar three-Lumbar four - Lumbar four-Lumbar five - Lumabr five-Sacal one - Bilateral, Posterior Lateral Fusion Lumbar three-four-Right;  Surgeon: Eustace Moore, MD;  Location: [place];  Service: Neurosurgery;  Laterality: Bilateral;   TONSILLECTOMY     as a kid   Patient Active Problem List   Diagnosis Date Noted   S/P lumbar laminectomy 08/09/2021   Trochanteric bursitis of left hip 03/12/2021   Trochanteric bursitis of right hip 03/12/2021   Lumbar spondylosis 07/28/2020   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   Aortic regurgitation 07/14/2020   Autoimmune hepatitis ([place]) 07/12/2020   Benign prostatic hyperplasia without lower urinary tract symptoms 07/12/2020   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   History of pancreatitis 07/12/2020   History of pancytopenia 07/12/2020   Hyperlipidemia, unspecified 07/12/2020   Irregular heart beat 07/12/2020   Long term (current) use of insulin ([place]) 07/12/2020   Mood disorder ([place]) 72/53/6644   Systolic murmur 03/47/4259   Type 2 diabetes mellitus with other specified complication ([place]) 56/38/7564   Prostate cancer ([place])    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   Lumbar radiculopathy 06/17/2020   Pain of left hip joint 08/25/2019   Malignant neoplasm of prostate ([place]) 06/24/2019  Aortic stenosis, moderate 03/17/2019   Cardiac murmur 12/05/2018   Essential hypertension 12/05/2018   Diabetes mellitus due to underlying condition with unspecified complications ([place]) 62/37/6283    PCP: Jillyn Ledger, FNP  REFERRING PROVIDER: Sherley Bounds, MD  REFERRING DIAG: M54.16 (ICD-10-CM) - Radiculopathy, lumbar region  Rationale for Evaluation and Treatment  Rehabilitation  THERAPY DIAG:  Other low back pain  Difficulty in walking, not elsewhere classified  Muscle weakness (generalized)  Pain in left hip  ONSET DATE: Lumbar Laminectomy & Microdiskectomy on 08/09/21  SUBJECTIVE:                                                                                                                                                                                           SUBJECTIVE STATEMENT: I feel more discomfort today.   PERTINENT HISTORY:  Chronic hip and LBP, previous PT for Lt hip  PAIN:  Are you having pain? Yes: NPRS scale: 9/10 with walking, 2/10 current when sitting  Pain location: across low back into Lt thigh Pain description: it hurts Aggravating factors: AM pain Relieving factors: used to be sitting but not so much any more   PRECAUTIONS: Fall  WEIGHT BEARING RESTRICTIONS No  FALLS:  Has patient fallen in last 6 months? No  LIVING ENVIRONMENT: Stairs at home- steps to get in from garage with hand rails, stairs in home with bedroom downstairs  OCCUPATION: retired  PLOF: Independent  PATIENT GOALS turning, get up and walk   OBJECTIVE:  Findings taken at evaluation unless otherwise noted.   MUSCLE LENGTH: General tightness  POSTURE: posterior pelvic tilt, pelvis drifted anteriorly to shoulders with weight in anterior portion of feet.  Ambulates with flexed posture, short step length and flat foot strike; rolling walker outside of home   LUMBAR ROM:   Active  A/PROM  eval 02/15/22  Flexion To knees Below knees  Extension Slightly past neutral 10d  Right lateral flexion    Left lateral flexion    Right rotation    Left rotation     (Blank rows = not tested)   LOWER EXTREMITY MMT:    Grossly able to lift against gravity but requires use of hands to get out of a chair 02/15/22:TUG: 14.11 without UE support    TODAY'S TREATMENT  Pt seen for aquatic therapy today.  Treatment took place in water 3.25-4.5  ft in depth at the [facility]. Temp of water was 91.  Pt entered/exited the pool via stairs independently with bilat rail.   -forward / backward walking, side stepping  -yellow hand buoys submerged at sides for core engagement forward and backward amb  49f - holding yellow hand buoys: hip abdct/ add; hip ext; hip openers: heel raises: squats x12 4.362f-SL squat [id number]/L x10 - hamstring stretch with foot on 3rd step multiple reps -gastroc stretch on bottom step -stair training. Cues for step to technique to decrease hip pain            -walking for recovery backwards throughout session for increased post core engagement.  Pt requires the buoyancy and hydrostatic pressure of water for support, and to offload joints by unweighting joint load by at least 50 % in navel deep water and by at least 75-80% in chest to neck deep water.  Viscosity of the water is needed for resistance of strengthening. Water current perturbations provides challenge to standing balance requiring increased core activation.  Objective testing     PATIENT EDUCATION:  Education details: exercise form/rationale Person educated: Patient and Spouse Education method: Explanation, Demonstration, Tactile cues, Verbal cues, and Handouts Education comprehension: verbalized understanding, returned demonstration, verbal cues required, tactile cues required, and needs further education   HOME EXERCISE PROGRAM: FPFNLKEK      avoid sitting longer than 45 min at a time  ASSESSMENT:  CLINICAL IMPRESSION: PN: pt climbing stairs but uses  step to pattern pulling himself up when ascending bilat ue on single handrail. 5 xSTS test completed in just over 14s improved but not yet meeting goal. Due to pain he continues to need cues for upright positioning with standing and while executing exercises. He continues to use rollator with amb distances for safety and to improve toleration to amb.  He has had improvement in trunk  flex/ext as noted in chart above.  His pain continue at higher level in left hip, buttock an thigh area which has increased slightly.  P scheduled to see ortho to evaluation if any tear in glut medius as symptoms mimic a possibility.  He will continue to benefit from aquatic skilled PT to progress towards goals improving safety and inde with functional mobility and ADL's    OBJECTIVE IMPAIRMENTS Abnormal gait, decreased activity tolerance, decreased balance, decreased mobility, difficulty walking, decreased strength, increased muscle spasms, impaired flexibility, improper body mechanics, postural dysfunction, and pain.   ACTIVITY LIMITATIONS carrying, lifting, bending, sitting, standing, stairs, transfers, and locomotion level  PARTICIPATION LIMITATIONS: meal prep, cleaning, laundry, shopping, community activity, and yard work  PERSONAL FACTORS Past/current experiences, Time since onset of injury/illness/exacerbation, and 1-2 comorbidities: trochanteric bursitis, progression of ambulation limitations  are also affecting patient's functional outcome.   REHAB POTENTIAL: Good  CLINICAL DECISION MAKING: Evolving/moderate complexity  EVALUATION COMPLEXITY: Moderate   GOALS: Goals reviewed with patient? Yes  SHORT TERM GOALS: Target date: 02/01/2022  Able to demo upright posture at rest without cuing Baseline: Goal status: ongoing   LONG TERM GOALS: Target date: 03/16/22  Able to complete 5TST in 12s or less without UE use Baseline: 14.11: 02/15/22 Goal status: ongoing  2.  Able to ambulate safely without use of RW Baseline:  Goal status: ongoing  3.  Able to pivot/turn in standing without increased hip pain Baseline:  Goal status: ongoing  4.  Able to safely navigate stairs, step-over-step and single hand rail use Baseline:  Goal status: ongoing    PLAN: PT FREQUENCY: 1-2x/week  PT DURATION: 10 weeks  PLANNED INTERVENTIONS: Therapeutic exercises, Therapeutic activity,  Neuromuscular re-education, Balance training, Gait training, Patient/Family education, Self Care, Joint mobilization, Stair training, Aquatic Therapy, Dry Needling, Electrical stimulation, Spinal mobilization, Cryotherapy, Moist heat, Taping, Manual therapy, and Re-evaluation.  PLAN FOR NEXT SESSION: general mobility and postural strengthening  Stanton Kidney ([place]) Storie Heffern MPT 02/15/22 1:21 PM

## 2022-02-19 ENCOUNTER — Ambulatory Visit (HOSPITAL_BASED_OUTPATIENT_CLINIC_OR_DEPARTMENT_OTHER): Payer: Medicare Other | Attending: Neurological Surgery | Admitting: Physical Therapy

## 2022-02-19 ENCOUNTER — Encounter (HOSPITAL_BASED_OUTPATIENT_CLINIC_OR_DEPARTMENT_OTHER): Payer: Self-pay | Admitting: Physical Therapy

## 2022-02-19 DIAGNOSIS — M25552 Pain in left hip: Secondary | ICD-10-CM | POA: Diagnosis present

## 2022-02-19 DIAGNOSIS — M6281 Muscle weakness (generalized): Secondary | ICD-10-CM | POA: Insufficient documentation

## 2022-02-19 DIAGNOSIS — M5459 Other low back pain: Secondary | ICD-10-CM | POA: Diagnosis present

## 2022-02-19 DIAGNOSIS — R262 Difficulty in walking, not elsewhere classified: Secondary | ICD-10-CM | POA: Insufficient documentation

## 2022-02-19 NOTE — Therapy (Signed)
OUTPATIENT PHYSICAL THERAPY THORACOLUMBAR TREATMENT NOTE      Patient Name: Benjamin Davila MRN: [id number] DOB:11-12-1940, 81 y.o., male Today's Date: 02/19/2022   PT End of Session - 02/19/22 1020     Visit Number 11    Number of Visits 21    Date for PT Re-Evaluation 03/16/22    Authorization Type BCBS MCR    Progress Note Due on Visit 74    PT Start Time 1013    PT Stop Time 1051    PT Time Calculation (min) 38 min    Activity Tolerance Patient tolerated treatment well;Patient limited by pain    Behavior During Therapy [facility] for tasks assessed/performed                  Past Medical History:  Diagnosis Date   Aortic regurgitation 07/14/2020   Aortic stenosis, moderate 03/17/2019   Autoimmune hepatitis ([place]) 07/12/2020   Benign prostatic hyperplasia without lower urinary tract symptoms 07/12/2020   Cardiac murmur 12/05/2018   Diabetes mellitus due to underlying condition with unspecified complications ([place]) 41/32/4401   Essential hypertension 12/05/2018   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   History of pancreatitis 07/12/2020   History of pancytopenia 07/12/2020   Hyperlipidemia, unspecified 07/12/2020   Irregular heart beat 07/12/2020   Long term (current) use of insulin ([place]) 07/12/2020   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   Lumbar radiculopathy 06/17/2020   Lumbar spondylosis 07/28/2020   Malignant neoplasm of prostate ([place]) 06/24/2019   Mood disorder ([place]) 07/12/2020   Pain of left hip joint 08/25/2019   PONV (postoperative nausea and vomiting)    Prostate cancer ([place])    Spinal stenosis of lumbar region 02/72/5366   Systolic murmur 44/07/4740   Trochanteric bursitis of left hip 03/12/2021   Trochanteric bursitis of right hip 03/12/2021   Type 2 diabetes mellitus with other specified complication ([place]) 59/56/3875   Past Surgical History:  Procedure Laterality Date   BACK SURGERY     lumbar surgery 20 years ago per patient   CHOLECYSTECTOMY      20 years ago per patient   LUMBAR LAMINECTOMY/DECOMPRESSION MICRODISCECTOMY Bilateral 08/09/2021   Procedure: Lumbar decompressive laminectomy  - Lumbar two-Lumabr three - Lumbar three-Lumbar four - Lumbar four-Lumbar five - Lumabr five-Sacal one - Bilateral, Posterior Lateral Fusion Lumbar three-four-Right;  Surgeon: Eustace Moore, MD;  Location: [place];  Service: Neurosurgery;  Laterality: Bilateral;   TONSILLECTOMY     as a kid   Patient Active Problem List   Diagnosis Date Noted   S/P lumbar laminectomy 08/09/2021   Trochanteric bursitis of left hip 03/12/2021   Trochanteric bursitis of right hip 03/12/2021   Lumbar spondylosis 07/28/2020   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   Aortic regurgitation 07/14/2020   Autoimmune hepatitis ([place]) 07/12/2020   Benign prostatic hyperplasia without lower urinary tract symptoms 07/12/2020   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   History of pancreatitis 07/12/2020   History of pancytopenia 07/12/2020   Hyperlipidemia, unspecified 07/12/2020   Irregular heart beat 07/12/2020   Long term (current) use of insulin ([place]) 07/12/2020   Mood disorder ([place]) 64/33/2951   Systolic murmur 88/41/6606   Type 2 diabetes mellitus with other specified complication ([place]) 30/16/0109   Prostate cancer ([place])    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   Lumbar radiculopathy 06/17/2020   Pain of left hip joint 08/25/2019   Malignant neoplasm of prostate ([place]) 06/24/2019   Aortic stenosis, moderate 03/17/2019   Cardiac murmur 12/05/2018   Essential hypertension 12/05/2018  Diabetes mellitus due to underlying condition with unspecified complications ([place]) 69/62/9528    PCP: Jillyn Ledger, FNP  REFERRING PROVIDER: Sherley Bounds, MD  REFERRING DIAG: M54.16 (ICD-10-CM) - Radiculopathy, lumbar region  Rationale for Evaluation and Treatment Rehabilitation  THERAPY DIAG:  Other low back pain  Difficulty in walking, not elsewhere classified  Muscle weakness  (generalized)  Pain in left hip  ONSET DATE: Lumbar Laminectomy & Microdiskectomy on 08/09/21  SUBJECTIVE:                                                                                                                                                                                           SUBJECTIVE STATEMENT: It didn't feel worse after the pool exercises.   PERTINENT HISTORY:  Chronic hip and LBP, previous PT for Lt hip  PAIN:  Are you having pain? Yes: NPRS scale: 9/10 with walking, 2/10 current when sitting  Pain location: across low back into Lt thigh Pain description: it hurts Aggravating factors: AM pain Relieving factors: used to be sitting but not so much any more   PRECAUTIONS: Fall  WEIGHT BEARING RESTRICTIONS No  FALLS:  Has patient fallen in last 6 months? No  LIVING ENVIRONMENT: Stairs at home- steps to get in from garage with hand rails, stairs in home with bedroom downstairs  OCCUPATION: retired  PLOF: Independent  PATIENT GOALS turning, get up and walk   OBJECTIVE:  Findings taken at evaluation unless otherwise noted.   MUSCLE LENGTH: General tightness  POSTURE: posterior pelvic tilt, pelvis drifted anteriorly to shoulders with weight in anterior portion of feet.  Ambulates with flexed posture, short step length and flat foot strike; rolling walker outside of home   LUMBAR ROM:   Active  A/PROM  eval 02/15/22  Flexion To knees Below knees  Extension Slightly past neutral 10d  Right lateral flexion    Left lateral flexion    Right rotation    Left rotation     (Blank rows = not tested)   LOWER EXTREMITY MMT:    Grossly able to lift against gravity but requires use of hands to get out of a chair 02/15/22:TUG: 14.11 without UE support    TODAY'S TREATMENT  Treatment                            02/19/22:  Nustep 5 min 5 LE only LAQ- 3s holds, feet off of floor, no trunk support Heel raises with ball bw ankles Standing gastroc  stretch Rocking planks- hands on elevated table Seated figure 4 stretch    PATIENT EDUCATION:  Education  details: exercise form/rationale Person educated: Patient and Spouse Education method: Explanation, Demonstration, Tactile cues, Verbal cues, and Handouts Education comprehension: verbalized understanding, returned demonstration, verbal cues required, tactile cues required, and needs further education   HOME EXERCISE PROGRAM: FPFNLKEK      avoid sitting longer than 45 min at a time  ASSESSMENT:  CLINICAL IMPRESSION: Continued to provide education on rationale for treatment as well as suspected injury to glut med. Seeing Dr Alvan Dame and will hold on PT until that time as he does not feel that the exercises are helping. PT will reach out to Dr Alvan Dame to discuss this. Minimal complaince with HEP reported but does feel less discomfort when he is in the pool.     OBJECTIVE IMPAIRMENTS Abnormal gait, decreased activity tolerance, decreased balance, decreased mobility, difficulty walking, decreased strength, increased muscle spasms, impaired flexibility, improper body mechanics, postural dysfunction, and pain.   ACTIVITY LIMITATIONS carrying, lifting, bending, sitting, standing, stairs, transfers, and locomotion level  PARTICIPATION LIMITATIONS: meal prep, cleaning, laundry, shopping, community activity, and yard work  PERSONAL FACTORS Past/current experiences, Time since onset of injury/illness/exacerbation, and 1-2 comorbidities: trochanteric bursitis, progression of ambulation limitations  are also affecting patient's functional outcome.   REHAB POTENTIAL: Good  CLINICAL DECISION MAKING: Evolving/moderate complexity  EVALUATION COMPLEXITY: Moderate   GOALS: Goals reviewed with patient? Yes  SHORT TERM GOALS: Target date: 02/01/2022  Able to demo upright posture at rest without cuing Baseline: Goal status: ongoing   LONG TERM GOALS: Target date: 03/16/22  Able to complete  5TST in 12s or less without UE use Baseline: 14.11: 02/15/22 Goal status: ongoing  2.  Able to ambulate safely without use of RW Baseline:  Goal status: ongoing  3.  Able to pivot/turn in standing without increased hip pain Baseline:  Goal status: ongoing  4.  Able to safely navigate stairs, step-over-step and single hand rail use Baseline:  Goal status: ongoing    PLAN: PT FREQUENCY: 1-2x/week  PT DURATION: 10 weeks  PLANNED INTERVENTIONS: Therapeutic exercises, Therapeutic activity, Neuromuscular re-education, Balance training, Gait training, Patient/Family education, Self Care, Joint mobilization, Stair training, Aquatic Therapy, Dry Needling, Electrical stimulation, Spinal mobilization, Cryotherapy, Moist heat, Taping, Manual therapy, and Re-evaluation.  PLAN FOR NEXT SESSION: outcome of visit with Dr Sharol Roussel C. Leydi Winstead PT, DPT 02/19/22 12:38 PM

## 2022-02-22 ENCOUNTER — Ambulatory Visit (HOSPITAL_BASED_OUTPATIENT_CLINIC_OR_DEPARTMENT_OTHER): Payer: Medicare Other | Admitting: Physical Therapy

## 2022-02-22 ENCOUNTER — Encounter (HOSPITAL_BASED_OUTPATIENT_CLINIC_OR_DEPARTMENT_OTHER): Payer: Self-pay | Admitting: Physical Therapy

## 2022-02-22 DIAGNOSIS — M5459 Other low back pain: Secondary | ICD-10-CM

## 2022-02-22 DIAGNOSIS — M6281 Muscle weakness (generalized): Secondary | ICD-10-CM

## 2022-02-22 DIAGNOSIS — R262 Difficulty in walking, not elsewhere classified: Secondary | ICD-10-CM

## 2022-02-22 DIAGNOSIS — M25552 Pain in left hip: Secondary | ICD-10-CM

## 2022-02-22 NOTE — Therapy (Signed)
OUTPATIENT PHYSICAL THERAPY THORACOLUMBAR TREATMENT NOTE      Patient Name: Benjamin Davila MRN: [id number] DOB:25-Jun-1940, 81 y.o., male Today's Date: 02/22/2022   PT End of Session - 02/22/22 1116     Visit Number 12    Number of Visits 21    Date for PT Re-Evaluation 03/16/22    Authorization Type BCBS MCR    Progress Note Due on Visit 15    PT Start Time 1031    PT Stop Time 1110    PT Time Calculation (min) 39 min    Activity Tolerance Patient tolerated treatment well;Patient limited by pain    Behavior During Therapy [facility] for tasks assessed/performed                   Past Medical History:  Diagnosis Date   Aortic regurgitation 07/14/2020   Aortic stenosis, moderate 03/17/2019   Autoimmune hepatitis ([place]) 07/12/2020   Benign prostatic hyperplasia without lower urinary tract symptoms 07/12/2020   Cardiac murmur 12/05/2018   Diabetes mellitus due to underlying condition with unspecified complications ([place]) 37/02/6268   Essential hypertension 12/05/2018   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   History of pancreatitis 07/12/2020   History of pancytopenia 07/12/2020   Hyperlipidemia, unspecified 07/12/2020   Irregular heart beat 07/12/2020   Long term (current) use of insulin ([place]) 07/12/2020   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   Lumbar radiculopathy 06/17/2020   Lumbar spondylosis 07/28/2020   Malignant neoplasm of prostate ([place]) 06/24/2019   Mood disorder ([place]) 07/12/2020   Pain of left hip joint 08/25/2019   PONV (postoperative nausea and vomiting)    Prostate cancer ([place])    Spinal stenosis of lumbar region 48/54/6270   Systolic murmur 35/00/9381   Trochanteric bursitis of left hip 03/12/2021   Trochanteric bursitis of right hip 03/12/2021   Type 2 diabetes mellitus with other specified complication ([place]) 82/99/3716   Past Surgical History:  Procedure Laterality Date   BACK SURGERY     lumbar surgery 20 years ago per patient   CHOLECYSTECTOMY      20 years ago per patient   LUMBAR LAMINECTOMY/DECOMPRESSION MICRODISCECTOMY Bilateral 08/09/2021   Procedure: Lumbar decompressive laminectomy  - Lumbar two-Lumabr three - Lumbar three-Lumbar four - Lumbar four-Lumbar five - Lumabr five-Sacal one - Bilateral, Posterior Lateral Fusion Lumbar three-four-Right;  Surgeon: Eustace Moore, MD;  Location: [place];  Service: Neurosurgery;  Laterality: Bilateral;   TONSILLECTOMY     as a kid   Patient Active Problem List   Diagnosis Date Noted   S/P lumbar laminectomy 08/09/2021   Trochanteric bursitis of left hip 03/12/2021   Trochanteric bursitis of right hip 03/12/2021   Lumbar spondylosis 07/28/2020   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   Aortic regurgitation 07/14/2020   Autoimmune hepatitis ([place]) 07/12/2020   Benign prostatic hyperplasia without lower urinary tract symptoms 07/12/2020   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   History of pancreatitis 07/12/2020   History of pancytopenia 07/12/2020   Hyperlipidemia, unspecified 07/12/2020   Irregular heart beat 07/12/2020   Long term (current) use of insulin ([place]) 07/12/2020   Mood disorder ([place]) 96/78/9381   Systolic murmur 01/75/1025   Type 2 diabetes mellitus with other specified complication ([place]) 85/27/7824   Prostate cancer ([place])    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   Lumbar radiculopathy 06/17/2020   Pain of left hip joint 08/25/2019   Malignant neoplasm of prostate ([place]) 06/24/2019   Aortic stenosis, moderate 03/17/2019   Cardiac murmur 12/05/2018   Essential hypertension 12/05/2018  Diabetes mellitus due to underlying condition with unspecified complications ([place]) 30/86/5784    PCP: Jillyn Ledger, FNP  REFERRING PROVIDER: Sherley Bounds, MD  REFERRING DIAG: M54.16 (ICD-10-CM) - Radiculopathy, lumbar region  Rationale for Evaluation and Treatment Rehabilitation  THERAPY DIAG:  Other low back pain  Difficulty in walking, not elsewhere classified  Muscle weakness  (generalized)  Pain in left hip  ONSET DATE: Lumbar Laminectomy & Microdiskectomy on 08/09/21  SUBJECTIVE:                                                                                                                                                                                           SUBJECTIVE STATEMENT: Pt reports feeling worse after land based exercises.  Wants to cancel all land based appointments. States he doesn't feel the ex are making him any better but "at least I don't hurt after aquatic therapy"  PERTINENT HISTORY:  Chronic hip and LBP, previous PT for Lt hip  PAIN:  Are you having pain? Yes: NPRS scale: 9/10 with walking, 2/10 current when sitting  Pain location: across low back into Lt thigh Pain description: it hurts Aggravating factors: AM pain Relieving factors: used to be sitting but not so much any more   PRECAUTIONS: Fall  WEIGHT BEARING RESTRICTIONS No  FALLS:  Has patient fallen in last 6 months? No  LIVING ENVIRONMENT: Stairs at home- steps to get in from garage with hand rails, stairs in home with bedroom downstairs  OCCUPATION: retired  PLOF: Independent  PATIENT GOALS turning, get up and walk   OBJECTIVE:  Findings taken at evaluation unless otherwise noted.   MUSCLE LENGTH: General tightness  POSTURE: posterior pelvic tilt, pelvis drifted anteriorly to shoulders with weight in anterior portion of feet.  Ambulates with flexed posture, short step length and flat foot strike; rolling walker outside of home   LUMBAR ROM:   Active  A/PROM  eval 02/15/22  Flexion To knees Below knees  Extension Slightly past neutral 10d  Right lateral flexion    Left lateral flexion    Right rotation    Left rotation     (Blank rows = not tested)   LOWER EXTREMITY MMT:    Grossly able to lift against gravity but requires use of hands to get out of a chair 02/15/22:TUG: 14.11 without UE support    TODAY'S TREATMENT     02/22/22 Pt seen for  aquatic therapy today.  Treatment took place in water 3.25-4.5 ft in depth at the [place] [facility]. Temp of water was 91.  Pt entered/exited the pool via stairs independently with bilat rail.   -forward /  backward walking, side stepping  -yellow hand buoys submerged at sides for core engagement forward and backward amb 65f - holding yellow hand buoys: hip abdct/ add; hip ext;  heel raises: squats x12 4.38f-side lunges ue add/abd with yellow hand buoys x 4 widths -SL squat 41f27f/L x10 holding to wall - hamstring stretch with foot on 3rd step multiple reps. (Pt declines today) -gastroc stretch on bottom step (Pt declines today)            -walking for recovery backwards throughout session for increased post core engagement.   Pt requires the buoyancy and hydrostatic pressure of water for support, and to offload joints by unweighting joint load by at least 50 % in navel deep water and by at least 75-80% in chest to neck deep water.  Viscosity of the water is needed for resistance of strengthening. Water current perturbations provides challenge to standing balance requiring increased core activation.    Treatment                            02/19/22:  Nustep 5 min 5 LE only LAQ- 3s holds, feet off of floor, no trunk support Heel raises with ball bw ankles Standing gastroc stretch Rocking planks- hands on elevated table Seated figure 4 stretch    PATIENT EDUCATION:  Education details: exercise form/rationale Person educated: Patient and Spouse Education method: Explanation, Demonstration, Tactile cues, Verbal cues, and Handouts Education comprehension: verbalized understanding, returned demonstration, verbal cues required, tactile cues required, and needs further education   HOME EXERCISE PROGRAM: FPFNLKEK      avoid sitting longer than 45 min at a time  ASSESSMENT:  CLINICAL IMPRESSION: Pt encouraged to see Dr OliAlvan Damefore cancelling any therapy appointments He is in  agreement. Continued rationale for treatment.  He has good toleration to session without increase in left hip or leg pain.  Does report radicular pain in lle this am 9/10. Pt reporting land based therapy makes him worse. Progression towards goals slow.      OBJECTIVE IMPAIRMENTS Abnormal gait, decreased activity tolerance, decreased balance, decreased mobility, difficulty walking, decreased strength, increased muscle spasms, impaired flexibility, improper body mechanics, postural dysfunction, and pain.   ACTIVITY LIMITATIONS carrying, lifting, bending, sitting, standing, stairs, transfers, and locomotion level  PARTICIPATION LIMITATIONS: meal prep, cleaning, laundry, shopping, community activity, and yard work  PERSONAL FACTORS Past/current experiences, Time since onset of injury/illness/exacerbation, and 1-2 comorbidities: trochanteric bursitis, progression of ambulation limitations  are also affecting patient's functional outcome.   REHAB POTENTIAL: Good  CLINICAL DECISION MAKING: Evolving/moderate complexity  EVALUATION COMPLEXITY: Moderate   GOALS: Goals reviewed with patient? Yes  SHORT TERM GOALS: Target date: 02/01/2022  Able to demo upright posture at rest without cuing Baseline: Goal status: ongoing   LONG TERM GOALS: Target date: 03/16/22  Able to complete 5TST in 12s or less without UE use Baseline: 14.11: 02/15/22 Goal status: ongoing  2.  Able to ambulate safely without use of RW Baseline:  Goal status: ongoing  3.  Able to pivot/turn in standing without increased hip pain Baseline:  Goal status: ongoing  4.  Able to safely navigate stairs, step-over-step and single hand rail use Baseline:  Goal status: ongoing    PLAN: PT FREQUENCY: 1-2x/week  PT DURATION: 10 weeks  PLANNED INTERVENTIONS: Therapeutic exercises, Therapeutic activity, Neuromuscular re-education, Balance training, Gait training, Patient/Family education, Self Care, Joint mobilization,  Stair training, Aquatic Therapy, Dry Needling, Electrical stimulation, Spinal  mobilization, Cryotherapy, Moist heat, Taping, Manual therapy, and Re-evaluation.  PLAN FOR NEXT SESSION: outcome of visit with Dr Gray Bernhardt [facility]) Nikitia Asbill MPT 02/22/22 11:18 AM

## 2022-02-26 ENCOUNTER — Encounter (HOSPITAL_BASED_OUTPATIENT_CLINIC_OR_DEPARTMENT_OTHER): Payer: Medicare Other | Admitting: Physical Therapy

## 2022-02-28 ENCOUNTER — Inpatient Hospital Stay: Payer: Medicare Other | Attending: Oncology

## 2022-02-28 DIAGNOSIS — C61 Malignant neoplasm of prostate: Secondary | ICD-10-CM | POA: Diagnosis present

## 2022-02-28 DIAGNOSIS — D509 Iron deficiency anemia, unspecified: Secondary | ICD-10-CM | POA: Diagnosis present

## 2022-02-28 DIAGNOSIS — D649 Anemia, unspecified: Secondary | ICD-10-CM

## 2022-02-28 DIAGNOSIS — Z79899 Other long term (current) drug therapy: Secondary | ICD-10-CM | POA: Diagnosis not present

## 2022-02-28 LAB — CBC WITH DIFFERENTIAL (CANCER CENTER ONLY)
Abs Immature Granulocytes: 0.01 10*3/uL (ref 0.00–0.07)
Basophils Absolute: 0 10*3/uL (ref 0.0–0.1)
Basophils Relative: 1 %
Eosinophils Absolute: 0.2 10*3/uL (ref 0.0–0.5)
Eosinophils Relative: 4 %
HCT: 33.8 % — ABNORMAL LOW (ref 39.0–52.0)
Hemoglobin: 11.2 g/dL — ABNORMAL LOW (ref 13.0–17.0)
Immature Granulocytes: 0 %
Lymphocytes Relative: 27 %
Lymphs Abs: 1.1 10*3/uL (ref 0.7–4.0)
MCH: 32.3 pg (ref 26.0–34.0)
MCHC: 33.1 g/dL (ref 30.0–36.0)
MCV: 97.4 fL (ref 80.0–100.0)
Monocytes Absolute: 0.4 10*3/uL (ref 0.1–1.0)
Monocytes Relative: 10 %
Neutro Abs: 2.4 10*3/uL (ref 1.7–7.7)
Neutrophils Relative %: 58 %
Platelet Count: 186 10*3/uL (ref 150–400)
RBC: 3.47 MIL/uL — ABNORMAL LOW (ref 4.22–5.81)
RDW: 12 % (ref 11.5–15.5)
WBC Count: 4.2 10*3/uL (ref 4.0–10.5)
nRBC: 0 % (ref 0.0–0.2)

## 2022-02-28 LAB — FERRITIN: Ferritin: 24 ng/mL (ref 24–336)

## 2022-03-01 ENCOUNTER — Encounter (HOSPITAL_BASED_OUTPATIENT_CLINIC_OR_DEPARTMENT_OTHER): Payer: Self-pay | Admitting: Physical Therapy

## 2022-03-01 ENCOUNTER — Ambulatory Visit (HOSPITAL_BASED_OUTPATIENT_CLINIC_OR_DEPARTMENT_OTHER): Payer: Medicare Other | Admitting: Physical Therapy

## 2022-03-01 DIAGNOSIS — M5459 Other low back pain: Secondary | ICD-10-CM

## 2022-03-01 DIAGNOSIS — M6281 Muscle weakness (generalized): Secondary | ICD-10-CM

## 2022-03-01 DIAGNOSIS — R262 Difficulty in walking, not elsewhere classified: Secondary | ICD-10-CM

## 2022-03-01 DIAGNOSIS — M25552 Pain in left hip: Secondary | ICD-10-CM

## 2022-03-01 NOTE — Therapy (Signed)
OUTPATIENT PHYSICAL THERAPY THORACOLUMBAR TREATMENT NOTE      Patient Name: Benjamin Davila MRN: [id number] DOB:July 02, 1940, 81 y.o., male Today's Date: 03/01/2022   PT End of Session - 03/01/22 1100     Visit Number 13    Number of Visits 21    Date for PT Re-Evaluation 03/16/22    Authorization Type BCBS MCR    Progress Note Due on Visit 34    PT Start Time 1031    PT Stop Time 1115    PT Time Calculation (min) 44 min    Activity Tolerance Patient tolerated treatment well;Patient limited by pain    Behavior During Therapy [facility] for tasks assessed/performed                    Past Medical History:  Diagnosis Date   Aortic regurgitation 07/14/2020   Aortic stenosis, moderate 03/17/2019   Autoimmune hepatitis ([place]) 07/12/2020   Benign prostatic hyperplasia without lower urinary tract symptoms 07/12/2020   Cardiac murmur 12/05/2018   Diabetes mellitus due to underlying condition with unspecified complications ([place]) 01/74/9449   Essential hypertension 12/05/2018   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   History of pancreatitis 07/12/2020   History of pancytopenia 07/12/2020   Hyperlipidemia, unspecified 07/12/2020   Irregular heart beat 07/12/2020   Long term (current) use of insulin ([place]) 07/12/2020   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   Lumbar radiculopathy 06/17/2020   Lumbar spondylosis 07/28/2020   Malignant neoplasm of prostate ([place]) 06/24/2019   Mood disorder ([place]) 07/12/2020   Pain of left hip joint 08/25/2019   PONV (postoperative nausea and vomiting)    Prostate cancer ([place])    Spinal stenosis of lumbar region 67/59/1638   Systolic murmur 46/65/9935   Trochanteric bursitis of left hip 03/12/2021   Trochanteric bursitis of right hip 03/12/2021   Type 2 diabetes mellitus with other specified complication ([place]) 70/17/7939   Past Surgical History:  Procedure Laterality Date   BACK SURGERY     lumbar surgery 20 years ago per patient   CHOLECYSTECTOMY      20 years ago per patient   LUMBAR LAMINECTOMY/DECOMPRESSION MICRODISCECTOMY Bilateral 08/09/2021   Procedure: Lumbar decompressive laminectomy  - Lumbar two-Lumabr three - Lumbar three-Lumbar four - Lumbar four-Lumbar five - Lumabr five-Sacal one - Bilateral, Posterior Lateral Fusion Lumbar three-four-Right;  Surgeon: Eustace Moore, MD;  Location: [place];  Service: Neurosurgery;  Laterality: Bilateral;   TONSILLECTOMY     as a kid   Patient Active Problem List   Diagnosis Date Noted   S/P lumbar laminectomy 08/09/2021   Trochanteric bursitis of left hip 03/12/2021   Trochanteric bursitis of right hip 03/12/2021   Lumbar spondylosis 07/28/2020   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   Aortic regurgitation 07/14/2020   Autoimmune hepatitis ([place]) 07/12/2020   Benign prostatic hyperplasia without lower urinary tract symptoms 07/12/2020   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   History of pancreatitis 07/12/2020   History of pancytopenia 07/12/2020   Hyperlipidemia, unspecified 07/12/2020   Irregular heart beat 07/12/2020   Long term (current) use of insulin ([place]) 07/12/2020   Mood disorder ([place]) 03/00/9233   Systolic murmur 00/76/2263   Type 2 diabetes mellitus with other specified complication ([place]) 33/54/5625   Prostate cancer ([place])    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   Lumbar radiculopathy 06/17/2020   Pain of left hip joint 08/25/2019   Malignant neoplasm of prostate ([place]) 06/24/2019   Aortic stenosis, moderate 03/17/2019   Cardiac murmur 12/05/2018   Essential hypertension  12/05/2018   Diabetes mellitus due to underlying condition with unspecified complications ([place]) 95/01/3266    PCP: Jillyn Ledger, FNP  REFERRING PROVIDER: Sherley Bounds, MD  REFERRING DIAG: M54.16 (ICD-10-CM) - Radiculopathy, lumbar region  Rationale for Evaluation and Treatment Rehabilitation  THERAPY DIAG:  Other low back pain  Difficulty in walking, not elsewhere classified  Muscle  weakness (generalized)  Pain in left hip  ONSET DATE: Lumbar Laminectomy & Microdiskectomy on 08/09/21  SUBJECTIVE:                                                                                                                                                                                           SUBJECTIVE STATEMENT: Pt reports Md appt Monday with Dr Alvan Dame. He states MD not concerned with glut medius, felt he needed to R/o left hip joint involvement. Suggested bursitis.  Gave cortizone shot pt reports with minor improvement in pain. Scheduled for groin injection next week.  PERTINENT HISTORY:  Chronic hip and LBP, previous PT for Lt hip  PAIN:  Are you having pain? Yes: NPRS scale: 6/10 with walking, 2/10 current when sitting  Pain location: across low back into Lt thigh Pain description: it hurts Aggravating factors: AM pain Relieving factors: used to be sitting but not so much any more   PRECAUTIONS: Fall  WEIGHT BEARING RESTRICTIONS No  FALLS:  Has patient fallen in last 6 months? No  LIVING ENVIRONMENT: Stairs at home- steps to get in from garage with hand rails, stairs in home with bedroom downstairs  OCCUPATION: retired  PLOF: Independent  PATIENT GOALS turning, get up and walk   OBJECTIVE:  Findings taken at evaluation unless otherwise noted.   MUSCLE LENGTH: General tightness  POSTURE: posterior pelvic tilt, pelvis drifted anteriorly to shoulders with weight in anterior portion of feet.  Ambulates with flexed posture, short step length and flat foot strike; rolling walker outside of home   LUMBAR ROM:   Active  A/PROM  eval 02/15/22  Flexion To knees Below knees  Extension Slightly past neutral 10d  Right lateral flexion    Left lateral flexion    Right rotation    Left rotation     (Blank rows = not tested)   LOWER EXTREMITY MMT:    Grossly able to lift against gravity but requires use of hands to get out of a chair 02/15/22:TUG: 14.11 without  UE support    TODAY'S TREATMENT     02/22/22 Pt seen for aquatic therapy today.  Treatment took place in water 3.25-4.5 ft in depth at the [facility]. Temp of water was 91.  Pt entered/exited the  pool via stairs independently with bilat rail.   -forward / backward walking, side stepping  -yellow hand buoys submerged at sides for core engagement forward and backward amb 53f - holding yellow hand buoys: hip circles: squats x12 4.330f_holding to wall SL squats 4.39f239f/L x15 -side lunges ue add/abd with yellow hand buoys x 4 widths -abd set 5 x 10s hold pushing kick board down - hamstring stretch with foot on 3rd step multiple reps.  -gastroc stretch on bottom step             -walking for recovery backwards throughout session for increased post core engagement.   Pt requires the buoyancy and hydrostatic pressure of water for support, and to offload joints by unweighting joint load by at least 50 % in navel deep water and by at least 75-80% in chest to neck deep water.  Viscosity of the water is needed for resistance of strengthening. Water current perturbations provides challenge to standing balance requiring increased core activation.    Treatment                            02/19/22:  Nustep 5 min 5 LE only LAQ- 3s holds, feet off of floor, no trunk support Heel raises with ball bw ankles Standing gastroc stretch Rocking planks- hands on elevated table Seated figure 4 stretch    PATIENT EDUCATION:  Education details: exercise form/rationale Person educated: Patient and Spouse Education method: Explanation, Demonstration, Tactile cues, Verbal cues, and Handouts Education comprehension: verbalized understanding, returned demonstration, verbal cues required, tactile cues required, and needs further education   HOME EXERCISE PROGRAM: FPFNLKEK      avoid sitting longer than 45 min at a time  ASSESSMENT:  CLINICAL IMPRESSION: Dr OlsIrish Elderssits inconclusive. Pt  skeptical on future outcome. He was given option by Dr OliAlvan Dame continue with or stop therapy. Dr OliAlvan Dameying to r/o hip dysfunction. Was not concerned with possible glut med tear. Pt has appointment with spine MD next week.He has decided to continue for now with majority of session in pool as on land increases pain with probable DC and end of cert if pt doesn't feel he is benefiting. He completes session with vc and without increase of pain sx.  Gait continues to be antalgic on deck of pool, improve to normal pattern with submersion.   OBJECTIVE IMPAIRMENTS Abnormal gait, decreased activity tolerance, decreased balance, decreased mobility, difficulty walking, decreased strength, increased muscle spasms, impaired flexibility, improper body mechanics, postural dysfunction, and pain.   ACTIVITY LIMITATIONS carrying, lifting, bending, sitting, standing, stairs, transfers, and locomotion level  PARTICIPATION LIMITATIONS: meal prep, cleaning, laundry, shopping, community activity, and yard work  PERSONAL FACTORS Past/current experiences, Time since onset of injury/illness/exacerbation, and 1-2 comorbidities: trochanteric bursitis, progression of ambulation limitations  are also affecting patient's functional outcome.   REHAB POTENTIAL: Good  CLINICAL DECISION MAKING: Evolving/moderate complexity  EVALUATION COMPLEXITY: Moderate   GOALS: Goals reviewed with patient? Yes  SHORT TERM GOALS: Target date: 02/01/2022  Able to demo upright posture at rest without cuing Baseline: Goal status: ongoing   LONG TERM GOALS: Target date: 03/16/22  Able to complete 5TST in 12s or less without UE use Baseline: 14.11: 02/15/22 Goal status: ongoing  2.  Able to ambulate safely without use of RW Baseline:  Goal status: ongoing  3.  Able to pivot/turn in standing without increased hip pain Baseline:  Goal status: ongoing  4.  Able to  safely navigate stairs, step-over-step and single hand rail  use Baseline:  Goal status: ongoing    PLAN: PT FREQUENCY: 1-2x/week  PT DURATION: 10 weeks  PLANNED INTERVENTIONS: Therapeutic exercises, Therapeutic activity, Neuromuscular re-education, Balance training, Gait training, Patient/Family education, Self Care, Joint mobilization, Stair training, Aquatic Therapy, Dry Needling, Electrical stimulation, Spinal mobilization, Cryotherapy, Moist heat, Taping, Manual therapy, and Re-evaluation.  PLAN FOR NEXT SESSION: core and le strengthening/ Balance  Stanton Kidney ([place]) Thessaly Mccullers MPT 03/01/22 1:26 PM

## 2022-03-05 ENCOUNTER — Encounter (HOSPITAL_BASED_OUTPATIENT_CLINIC_OR_DEPARTMENT_OTHER): Payer: Medicare Other | Admitting: Physical Therapy

## 2022-03-07 NOTE — Therapy (Signed)
OUTPATIENT PHYSICAL THERAPY THORACOLUMBAR TREATMENT NOTE      Patient Name: Benjamin Davila MRN: [id number] DOB:Nov 16, 1940, 81 y.o., male Today's Date: 03/08/2022   PT End of Session - 03/08/22 1040     Visit Number 14    Number of Visits 21    Date for PT Re-Evaluation 03/16/22    Authorization Type BCBS MCR    Progress Note Due on Visit 44    PT Start Time 1031    PT Stop Time 1115    PT Time Calculation (min) 44 min    Activity Tolerance Patient tolerated treatment well;Patient limited by pain    Behavior During Therapy [facility] for tasks assessed/performed                     Past Medical History:  Diagnosis Date   Aortic regurgitation 07/14/2020   Aortic stenosis, moderate 03/17/2019   Autoimmune hepatitis ([place]) 07/12/2020   Benign prostatic hyperplasia without lower urinary tract symptoms 07/12/2020   Cardiac murmur 12/05/2018   Diabetes mellitus due to underlying condition with unspecified complications ([place]) 40/98/1191   Essential hypertension 12/05/2018   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   History of pancreatitis 07/12/2020   History of pancytopenia 07/12/2020   Hyperlipidemia, unspecified 07/12/2020   Irregular heart beat 07/12/2020   Long term (current) use of insulin ([place]) 07/12/2020   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   Lumbar radiculopathy 06/17/2020   Lumbar spondylosis 07/28/2020   Malignant neoplasm of prostate ([place]) 06/24/2019   Mood disorder ([place]) 07/12/2020   Pain of left hip joint 08/25/2019   PONV (postoperative nausea and vomiting)    Prostate cancer ([place])    Spinal stenosis of lumbar region 47/82/9562   Systolic murmur 13/12/6576   Trochanteric bursitis of left hip 03/12/2021   Trochanteric bursitis of right hip 03/12/2021   Type 2 diabetes mellitus with other specified complication ([place]) 46/96/2952   Past Surgical History:  Procedure Laterality Date   BACK SURGERY     lumbar surgery 20 years ago per patient    CHOLECYSTECTOMY     20 years ago per patient   LUMBAR LAMINECTOMY/DECOMPRESSION MICRODISCECTOMY Bilateral 08/09/2021   Procedure: Lumbar decompressive laminectomy  - Lumbar two-Lumabr three - Lumbar three-Lumbar four - Lumbar four-Lumbar five - Lumabr five-Sacal one - Bilateral, Posterior Lateral Fusion Lumbar three-four-Right;  Surgeon: Eustace Moore, MD;  Location: [facility];  Service: Neurosurgery;  Laterality: Bilateral;   TONSILLECTOMY     as a kid   Patient Active Problem List   Diagnosis Date Noted   S/P lumbar laminectomy 08/09/2021   Trochanteric bursitis of left hip 03/12/2021   Trochanteric bursitis of right hip 03/12/2021   Lumbar spondylosis 07/28/2020   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   Aortic regurgitation 07/14/2020   Autoimmune hepatitis ([place]) 07/12/2020   Benign prostatic hyperplasia without lower urinary tract symptoms 07/12/2020   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   History of pancreatitis 07/12/2020   History of pancytopenia 07/12/2020   Hyperlipidemia, unspecified 07/12/2020   Irregular heart beat 07/12/2020   Long term (current) use of insulin ([place]) 07/12/2020   Mood disorder ([place]) 84/13/2440   Systolic murmur 03/17/2535   Type 2 diabetes mellitus with other specified complication ([place]) 64/40/3474   Prostate cancer ([place])    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   Lumbar radiculopathy 06/17/2020   Pain of left hip joint 08/25/2019   Malignant neoplasm of prostate ([place]) 06/24/2019   Aortic stenosis, moderate 03/17/2019   Cardiac murmur 12/05/2018   Essential  hypertension 12/05/2018   Diabetes mellitus due to underlying condition with unspecified complications ([place]) 08/65/7846    PCP: Jillyn Ledger, FNP  REFERRING PROVIDER: Sherley Bounds, MD  REFERRING DIAG: M54.16 (ICD-10-CM) - Radiculopathy, lumbar region  Rationale for Evaluation and Treatment Rehabilitation  THERAPY DIAG:  Other low back pain  Difficulty in walking, not elsewhere  classified  Muscle weakness (generalized)  Pain in left hip  ONSET DATE: Lumbar Laminectomy & Microdiskectomy on 08/09/21  SUBJECTIVE:                                                                                                                                                                                           SUBJECTIVE STATEMENT: Pt continues without relief from injection nor overall perceived left LB and hip pain. He requests to cancel his next 2 land based appt as he reports increased pain associated with it.    PERTINENT HISTORY:  Chronic hip and LBP, previous PT for Lt hip  PAIN:  Are you having pain? Yes: NPRS scale: 6/10 with walking, 2/10 current when sitting  Pain location: across low back into Lt thigh Pain description: it hurts Aggravating factors: AM pain Relieving factors: used to be sitting but not so much any more   PRECAUTIONS: Fall  WEIGHT BEARING RESTRICTIONS No  FALLS:  Has patient fallen in last 6 months? No  LIVING ENVIRONMENT: Stairs at home- steps to get in from garage with hand rails, stairs in home with bedroom downstairs  OCCUPATION: retired  PLOF: Independent  PATIENT GOALS turning, get up and walk   OBJECTIVE:  Findings taken at evaluation unless otherwise noted.   MUSCLE LENGTH: General tightness  POSTURE: posterior pelvic tilt, pelvis drifted anteriorly to shoulders with weight in anterior portion of feet.  Ambulates with flexed posture, short step length and flat foot strike; rolling walker outside of home   LUMBAR ROM:   Active  A/PROM  eval 02/15/22  Flexion To knees Below knees  Extension Slightly past neutral 10d  Right lateral flexion    Left lateral flexion    Right rotation    Left rotation     (Blank rows = not tested)   LOWER EXTREMITY MMT:    Grossly able to lift against gravity but requires use of hands to get out of a chair 02/15/22:TUG: 14.11 without UE support    TODAY'S TREATMENT    03/08/22 Pt seen for aquatic therapy today.  Treatment took place in water 3.25-4.5 ft in depth at the [facility]. Temp of water was 91.  Pt entered/exited the pool via stairs independently with bilat rail.   -forward /  backward walking, side stepping  -yellow hand buoys submerged at sides for core engagement forward and backward amb 346f -side lunges ue add/abd with yellow hand buoys x 4 widths - holding wall: hip circles; hip openers; squats x12 4.367f-holding to wall SL squats 4.46f42f/L x15 -abd set 10 x 10s hold pushing kick board down -lumbar rotation -kick board row LE staggered R/L x15 ea. - hamstring stretch with foot on 3rd step multiple reps.  -gastroc stretch on bottom step             -walking for recovery backwards throughout session for increased post core engagement.   Pt requires the buoyancy and hydrostatic pressure of water for support, and to offload joints by unweighting joint load by at least 50 % in navel deep water and by at least 75-80% in chest to neck deep water.  Viscosity of the water is needed for resistance of strengthening. Water current perturbations provides challenge to standing balance requiring increased core activation.    Treatment                            02/19/22:  Nustep 5 min 5 LE only LAQ- 3s holds, feet off of floor, no trunk support Heel raises with ball bw ankles Standing gastroc stretch Rocking planks- hands on elevated table Seated figure 4 stretch    PATIENT EDUCATION:  Education details: exercise form/rationale Person educated: Patient and Spouse Education method: Explanation, Demonstration, Tactile cues, Verbal cues, and Handouts Education comprehension: verbalized understanding, returned demonstration, verbal cues required, tactile cues required, and needs further education   HOME EXERCISE PROGRAM: FPFNLKEK      avoid sitting longer than 45 min at a time  ASSESSMENT:  CLINICAL IMPRESSION:  Pt reluctant  to see any benefit of therapy although has voiced desire to finish out aquatic based visits. He does demonstrate less guarded amb on deck and completes aquatic based exercises with minimal increase in left hip pain other than with end range abd. Goals ongoing     OBJECTIVE IMPAIRMENTS Abnormal gait, decreased activity tolerance, decreased balance, decreased mobility, difficulty walking, decreased strength, increased muscle spasms, impaired flexibility, improper body mechanics, postural dysfunction, and pain.   ACTIVITY LIMITATIONS carrying, lifting, bending, sitting, standing, stairs, transfers, and locomotion level  PARTICIPATION LIMITATIONS: meal prep, cleaning, laundry, shopping, community activity, and yard work  PERSONAL FACTORS Past/current experiences, Time since onset of injury/illness/exacerbation, and 1-2 comorbidities: trochanteric bursitis, progression of ambulation limitations  are also affecting patient's functional outcome.   REHAB POTENTIAL: Good  CLINICAL DECISION MAKING: Evolving/moderate complexity  EVALUATION COMPLEXITY: Moderate   GOALS: Goals reviewed with patient? Yes  SHORT TERM GOALS: Target date: 02/01/2022  Able to demo upright posture at rest without cuing Baseline: Goal status: Achieved   LONG TERM GOALS: Target date: 03/16/22  Able to complete 5TST in 12s or less without UE use Baseline: 14.11: 02/15/22 Goal status: ongoing  2.  Able to ambulate safely without use of RW Baseline:  Goal status: ongoing  3.  Able to pivot/turn in standing without increased hip pain Baseline:  Goal status: ongoing  4.  Able to safely navigate stairs, step-over-step and single hand rail use Baseline:  Goal status: ongoing    PLAN: PT FREQUENCY: 1-2x/week  PT DURATION: 10 weeks  PLANNED INTERVENTIONS: Therapeutic exercises, Therapeutic activity, Neuromuscular re-education, Balance training, Gait training, Patient/Family education, Self Care, Joint  mobilization, Stair training, Aquatic Therapy, Dry Needling, Electrical stimulation, Spinal  mobilization, Cryotherapy, Moist heat, Taping, Manual therapy, and Re-evaluation.  PLAN FOR NEXT SESSION: core and le strengthening/ Balance  Arliene Rosenow ([place]) Liberti Appleton MPT 03/08/22 1:10 PM

## 2022-03-08 ENCOUNTER — Encounter (HOSPITAL_BASED_OUTPATIENT_CLINIC_OR_DEPARTMENT_OTHER): Payer: Self-pay | Admitting: Physical Therapy

## 2022-03-08 ENCOUNTER — Ambulatory Visit (HOSPITAL_BASED_OUTPATIENT_CLINIC_OR_DEPARTMENT_OTHER): Payer: Medicare Other | Admitting: Physical Therapy

## 2022-03-08 DIAGNOSIS — M6281 Muscle weakness (generalized): Secondary | ICD-10-CM

## 2022-03-08 DIAGNOSIS — M5459 Other low back pain: Secondary | ICD-10-CM | POA: Diagnosis not present

## 2022-03-08 DIAGNOSIS — R262 Difficulty in walking, not elsewhere classified: Secondary | ICD-10-CM

## 2022-03-08 DIAGNOSIS — M25552 Pain in left hip: Secondary | ICD-10-CM

## 2022-03-12 ENCOUNTER — Ambulatory Visit (HOSPITAL_BASED_OUTPATIENT_CLINIC_OR_DEPARTMENT_OTHER): Payer: Medicare Other | Admitting: Physical Therapy

## 2022-03-14 NOTE — Therapy (Signed)
OUTPATIENT PHYSICAL THERAPY THORACOLUMBAR TREATMENT NOTE  PHYSICAL THERAPY DISCHARGE SUMMARY  Visits from Start of Care: 15  Current functional level related to goals / functional outcomes: Pt continues with difficulty with amb including stair climbing, continue to amb with AD all limited by pain   Remaining deficits: Amb, stair climbing, sleeping, decreased strength   Education / Equipment: Management of condition/ HEP   Patient agrees to discharge. Patient goals were partially met. Patient is being discharged due to the patient's request.     Patient Name: Benjamin Davila MRN: [id number] DOB:09-12-1940, 81 y.o., male Today's Date: 03/15/2022   PT End of Session - 03/15/22 1109     Visit Number 15    Number of Visits 21    Date for PT Re-Evaluation 03/16/22    Authorization Type [employer] MCR    Progress Note Due on Visit 20    PT Start Time 1035    PT Stop Time 1110    PT Time Calculation (min) 35 min    Activity Tolerance Patient tolerated treatment well;Patient limited by pain    Behavior During Therapy [facility] for tasks assessed/performed                      Past Medical History:  Diagnosis Date   Aortic regurgitation 07/14/2020   Aortic stenosis, moderate 03/17/2019   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   Benign prostatic hyperplasia without lower urinary tract symptoms 07/12/2020   Cardiac murmur 12/05/2018   Diabetes mellitus due to underlying condition with unspecified complications ([place]) 74/04/8785   Essential hypertension 12/05/2018   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   History of pancreatitis 07/12/2020   History of pancytopenia 07/12/2020   Hyperlipidemia, unspecified 07/12/2020   Irregular heart beat 07/12/2020   Long term (current) use of insulin ([place]) 07/12/2020   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   Lumbar radiculopathy 06/17/2020   Lumbar spondylosis 07/28/2020   Malignant neoplasm of prostate ([place]) 06/24/2019   Mood disorder ([place])  07/12/2020   Pain of left hip joint 08/25/2019   PONV (postoperative nausea and vomiting)    Prostate cancer ([place])    Spinal stenosis of lumbar region 76/72/0947   Systolic murmur 09/62/8366   Trochanteric bursitis of left hip 03/12/2021   Trochanteric bursitis of right hip 03/12/2021   Type 2 diabetes mellitus with other specified complication ([place]) 29/47/6546   Past Surgical History:  Procedure Laterality Date   BACK SURGERY     lumbar surgery 20 years ago per patient   CHOLECYSTECTOMY     20 years ago per patient   LUMBAR LAMINECTOMY/DECOMPRESSION MICRODISCECTOMY Bilateral 08/09/2021   Procedure: Lumbar decompressive laminectomy  - Lumbar two-Lumabr three - Lumbar three-Lumbar four - Lumbar four-Lumbar five - Lumabr five-Sacal one - Bilateral, Posterior Lateral Fusion Lumbar three-four-Right;  Surgeon: Eustace Moore, MD;  Location: [place];  Service: Neurosurgery;  Laterality: Bilateral;   TONSILLECTOMY     as a kid   Patient Active Problem List   Diagnosis Date Noted   S/P lumbar laminectomy 08/09/2021   Trochanteric bursitis of left hip 03/12/2021   Trochanteric bursitis of right hip 03/12/2021   Lumbar spondylosis 07/28/2020   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   Aortic regurgitation 07/14/2020   Autoimmune hepatitis ([place]) 07/12/2020   Benign prostatic hyperplasia without lower urinary tract symptoms 07/12/2020   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   History of pancreatitis 07/12/2020   History of pancytopenia 07/12/2020   Hyperlipidemia, unspecified 07/12/2020   Irregular heart beat 07/12/2020  Long term (current) use of insulin ([place]) 07/12/2020   Mood disorder ([place]) 88/41/6606   Systolic murmur 30/16/0109   Type 2 diabetes mellitus with other specified complication ([place]) 32/35/5732   Prostate cancer ([place])    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   Lumbar radiculopathy 06/17/2020   Pain of left hip joint 08/25/2019   Malignant neoplasm of prostate ([place])  06/24/2019   Aortic stenosis, moderate 03/17/2019   Cardiac murmur 12/05/2018   Essential hypertension 12/05/2018   Diabetes mellitus due to underlying condition with unspecified complications ([place]) 20/25/4270    PCP: Jillyn Ledger, FNP  REFERRING PROVIDER: Sherley Bounds, MD  REFERRING DIAG: M54.16 (ICD-10-CM) - Radiculopathy, lumbar region  Rationale for Evaluation and Treatment Rehabilitation  THERAPY DIAG:  Other low back pain  Difficulty in walking, not elsewhere classified  Muscle weakness (generalized)  Pain in left hip  ONSET DATE: Lumbar Laminectomy & Microdiskectomy on 08/09/21  SUBJECTIVE:                                                                                                                                                                                           SUBJECTIVE STATEMENT: Pt reports increasing pain since last injection.  He does not believe therapy water or land based, is helping.  Requests DC.   PERTINENT HISTORY:  Chronic hip and LBP, previous PT for Lt hip  PAIN:  Are you having pain? Yes: NPRS scale: 8/10 with walking, 2/10 current when sitting  Pain location: across low back into Lt thigh Pain description: it hurts Aggravating factors: AM pain Relieving factors: used to be sitting but not so much any more   PRECAUTIONS: Fall  WEIGHT BEARING RESTRICTIONS No  FALLS:  Has patient fallen in last 6 months? No  LIVING ENVIRONMENT: Stairs at home- steps to get in from garage with hand rails, stairs in home with bedroom downstairs  OCCUPATION: retired  PLOF: Independent  PATIENT GOALS turning, get up and walk   OBJECTIVE:  Findings taken at evaluation unless otherwise noted.   MUSCLE LENGTH: General tightness  POSTURE: posterior pelvic tilt, pelvis drifted anteriorly to shoulders with weight in anterior portion of feet.  Ambulates with flexed posture, short step length and flat foot strike; rolling walker outside of  home   LUMBAR ROM:   Active  A/PROM  eval 02/15/22  Flexion To knees Below knees  Extension Slightly past neutral 10d  Right lateral flexion    Left lateral flexion    Right rotation    Left rotation     (Blank rows = not tested)   LOWER EXTREMITY MMT:    Grossly able to lift  against gravity but requires use of hands to get out of a chair 02/15/22:TUG: 14.11 without UE support    TODAY'S TREATMENT   03/08/22 Self care: Pt with question re: glut medius tear, torn labrum and bursitis. He is instructed to continue with HEP as able/tolerated.  Instructed on the importance of movement vs non-movement.    Treatment                            02/19/22:  Nustep 5 min 5 LE only LAQ- 3s holds, feet off of floor, no trunk support Heel raises with ball bw ankles Standing gastroc stretch Rocking planks- hands on elevated table Seated figure 4 stretch    PATIENT EDUCATION:  Education details: exercise form/rationale Person educated: Patient and Spouse Education method: Explanation, Demonstration, Tactile cues, Verbal cues, and Handouts Education comprehension: verbalized understanding, returned demonstration, verbal cues required, tactile cues required, and needs further education   HOME EXERCISE PROGRAM: [id number]      avoid sitting longer than 45 min at a time  ASSESSMENT:  CLINICAL IMPRESSION:  Pt has print outs in which he has downloaded with symptoms and treatments of above noted dysfunctions.  He is to return to see Dr Alvan Dame soon and is encouraged to discuss concerns with him. He has pleasantly declined any further PT at this time.  He declines functional testing due to increased hip pain. Pt DC'ed     OBJECTIVE IMPAIRMENTS Abnormal gait, decreased activity tolerance, decreased balance, decreased mobility, difficulty walking, decreased strength, increased muscle spasms, impaired flexibility, improper body mechanics, postural dysfunction, and pain.   ACTIVITY  LIMITATIONS carrying, lifting, bending, sitting, standing, stairs, transfers, and locomotion level  PARTICIPATION LIMITATIONS: meal prep, cleaning, laundry, shopping, community activity, and yard work  PERSONAL FACTORS Past/current experiences, Time since onset of injury/illness/exacerbation, and 1-2 comorbidities: trochanteric bursitis, progression of ambulation limitations  are also affecting patient's functional outcome.   REHAB POTENTIAL: Good  CLINICAL DECISION MAKING: Evolving/moderate complexity  EVALUATION COMPLEXITY: Moderate   GOALS: Goals reviewed with patient? Yes  SHORT TERM GOALS: Target date: 02/01/2022  Able to demo upright posture at rest without cuing Baseline: Goal status: Achieved   LONG TERM GOALS: Target date: 03/16/22  Able to complete 5TST in 12s or less without UE use Baseline: 14.11: 02/15/22 Goal status: Not met  2.  Able to ambulate safely without use of RW Baseline:  Goal status:  Not met   3.  Able to pivot/turn in standing without increased hip pain Baseline:  Goal status:  Not met  4.  Able to safely navigate stairs, step-over-step and single hand rail use Baseline:  Goal status:  Not met    PLAN: PT FREQUENCY: 1-2x/week  PT DURATION: 10 weeks  PLANNED INTERVENTIONS: Therapeutic exercises, Therapeutic activity, Neuromuscular re-education, Balance training, Gait training, Patient/Family education, Self Care, Joint mobilization, Stair training, Aquatic Therapy, Dry Needling, Electrical stimulation, Spinal mobilization, Cryotherapy, Moist heat, Taping, Manual therapy, and Re-evaluation.  PLAN FOR NEXT SESSION: core and le strengthening/ Balance  Stanton Kidney ([place]) Elna Radovich MPT 03/15/22 1:43 PM

## 2022-03-15 ENCOUNTER — Ambulatory Visit (HOSPITAL_BASED_OUTPATIENT_CLINIC_OR_DEPARTMENT_OTHER): Payer: Medicare Other | Admitting: Physical Therapy

## 2022-03-15 ENCOUNTER — Encounter (HOSPITAL_BASED_OUTPATIENT_CLINIC_OR_DEPARTMENT_OTHER): Payer: Self-pay | Admitting: Physical Therapy

## 2022-03-15 DIAGNOSIS — M6281 Muscle weakness (generalized): Secondary | ICD-10-CM

## 2022-03-15 DIAGNOSIS — M5459 Other low back pain: Secondary | ICD-10-CM

## 2022-03-15 DIAGNOSIS — R262 Difficulty in walking, not elsewhere classified: Secondary | ICD-10-CM

## 2022-03-15 DIAGNOSIS — M25552 Pain in left hip: Secondary | ICD-10-CM

## 2022-03-19 ENCOUNTER — Encounter (HOSPITAL_BASED_OUTPATIENT_CLINIC_OR_DEPARTMENT_OTHER): Payer: Medicare Other | Admitting: Physical Therapy

## 2022-04-26 ENCOUNTER — Ambulatory Visit (INDEPENDENT_AMBULATORY_CARE_PROVIDER_SITE_OTHER): Payer: Medicare Other | Admitting: Podiatry

## 2022-04-26 DIAGNOSIS — B353 Tinea pedis: Secondary | ICD-10-CM | POA: Diagnosis not present

## 2022-04-26 DIAGNOSIS — E119 Type 2 diabetes mellitus without complications: Secondary | ICD-10-CM | POA: Diagnosis not present

## 2022-04-26 DIAGNOSIS — E1142 Type 2 diabetes mellitus with diabetic polyneuropathy: Secondary | ICD-10-CM

## 2022-04-26 MED ORDER — GABAPENTIN 300 MG PO CAPS: 300.0000 mg | ORAL_CAPSULE | Freq: Three times a day (TID) | ORAL | 0 refills | Status: DC

## 2022-04-26 MED ORDER — CLOTRIMAZOLE-BETAMETHASONE 1-0.05 % EX CREA: 1.0000 | TOPICAL_CREAM | Freq: Two times a day (BID) | CUTANEOUS | 2 refills | Status: DC

## 2022-04-26 NOTE — Progress Notes (Signed)
  Subjective:  Patient ID: Benjamin Davila, male    DOB: 1941/05/03,  MRN: [id number]  Chief Complaint  Patient presents with   Diabetes    New Patient, diabetic foot exam   Nail Problem    Thick painful toenails    81 y.o. male presents with the above complaint. History confirmed with patient.  He has multiple issues he recently moved here few years ago from [place], he has type 2 diabetes that is usually well-controlled his A1c is 6.9%.  He does note occasional numbness and sharp random shooting pains and occasional tingling.  He also notes very dry skin and rash on the toes and occasional redness here as well  Objective:  Physical Exam: warm, good capillary refill, no trophic changes or ulcerative lesions, normal DP and PT pulses, and he has an abnormal monofilament exam with little to no protective sensation appreciation in the forefoot, he has erythematous scaling patchy skin across the bottom of the foot and in between some of the toes, onychomycosis of all toenails.  Assessment:   1. Tinea pedis of both feet   2. Type 2 diabetes mellitus with polyneuropathy (HCC)   3. Encounter for diabetic foot exam [facility])      Plan:  Patient was evaluated and treated and all questions answered.  Patient educated on diabetes. Discussed proper diabetic foot care and discussed risks and complications of disease. Educated patient in depth on reasons to return to the office immediately should he/she discover anything concerning or new on the feet. All questions answered. Discussed proper shoes as well.   We discussed treatment of diabetic polyneuropathy including symptomatic treatment with oral medication such as gabapentin or Lyrica.  He is interested in trying gabapentin, I discussed the risk benefits and possible side effects of this including drowsiness or dizziness we discussed the need to taper dosage and increase possibly pending progression of his symptoms or possible side effects, also  discussed him the importance of daily foot inspection and watching for ulceration risk  Discussed the etiology and treatment options for tinea pedis.  Discussed topical and oral treatment.  Recommended topical treatment with Lotrisone cream.  This was sent to the patient's pharmacy.  Also discussed appropriate foot hygiene, use of antifungal spray such as Tinactin in shoes, as well as cleaning her foot surfaces such as showers and bathroom floors with bleach.   Return if symptoms worsen or fail to improve.

## 2022-05-03 ENCOUNTER — Telehealth: Payer: Self-pay | Admitting: Oncology

## 2022-05-03 ENCOUNTER — Inpatient Hospital Stay: Payer: Medicare Other | Attending: Oncology

## 2022-05-03 ENCOUNTER — Inpatient Hospital Stay (HOSPITAL_BASED_OUTPATIENT_CLINIC_OR_DEPARTMENT_OTHER): Payer: Medicare Other | Admitting: Oncology

## 2022-05-03 VITALS — BP 137/81 | HR 95 | Temp 98.1°F | Resp 20 | Ht 70.0 in | Wt 183.6 lb

## 2022-05-03 DIAGNOSIS — K754 Autoimmune hepatitis: Secondary | ICD-10-CM | POA: Insufficient documentation

## 2022-05-03 DIAGNOSIS — D509 Iron deficiency anemia, unspecified: Secondary | ICD-10-CM | POA: Insufficient documentation

## 2022-05-03 DIAGNOSIS — C61 Malignant neoplasm of prostate: Secondary | ICD-10-CM | POA: Diagnosis not present

## 2022-05-03 DIAGNOSIS — I10 Essential (primary) hypertension: Secondary | ICD-10-CM | POA: Insufficient documentation

## 2022-05-03 DIAGNOSIS — I350 Nonrheumatic aortic (valve) stenosis: Secondary | ICD-10-CM | POA: Insufficient documentation

## 2022-05-03 DIAGNOSIS — Z8719 Personal history of other diseases of the digestive system: Secondary | ICD-10-CM | POA: Insufficient documentation

## 2022-05-03 DIAGNOSIS — E119 Type 2 diabetes mellitus without complications: Secondary | ICD-10-CM | POA: Insufficient documentation

## 2022-05-03 DIAGNOSIS — Z79899 Other long term (current) drug therapy: Secondary | ICD-10-CM | POA: Insufficient documentation

## 2022-05-03 DIAGNOSIS — D123 Benign neoplasm of transverse colon: Secondary | ICD-10-CM | POA: Diagnosis not present

## 2022-05-03 DIAGNOSIS — D649 Anemia, unspecified: Secondary | ICD-10-CM

## 2022-05-03 DIAGNOSIS — Z923 Personal history of irradiation: Secondary | ICD-10-CM | POA: Diagnosis not present

## 2022-05-03 DIAGNOSIS — R5383 Other fatigue: Secondary | ICD-10-CM | POA: Diagnosis not present

## 2022-05-03 DIAGNOSIS — D122 Benign neoplasm of ascending colon: Secondary | ICD-10-CM | POA: Diagnosis not present

## 2022-05-03 DIAGNOSIS — N400 Enlarged prostate without lower urinary tract symptoms: Secondary | ICD-10-CM | POA: Insufficient documentation

## 2022-05-03 DIAGNOSIS — R0609 Other forms of dyspnea: Secondary | ICD-10-CM | POA: Diagnosis not present

## 2022-05-03 LAB — CBC WITH DIFFERENTIAL (CANCER CENTER ONLY)
Abs Immature Granulocytes: 0.03 10*3/uL (ref 0.00–0.07)
Basophils Absolute: 0 10*3/uL (ref 0.0–0.1)
Basophils Relative: 1 %
Eosinophils Absolute: 0.1 10*3/uL (ref 0.0–0.5)
Eosinophils Relative: 2 %
HCT: 35.8 % — ABNORMAL LOW (ref 39.0–52.0)
Hemoglobin: 11.8 g/dL — ABNORMAL LOW (ref 13.0–17.0)
Immature Granulocytes: 1 %
Lymphocytes Relative: 31 %
Lymphs Abs: 1.2 10*3/uL (ref 0.7–4.0)
MCH: 31.9 pg (ref 26.0–34.0)
MCHC: 33 g/dL (ref 30.0–36.0)
MCV: 96.8 fL (ref 80.0–100.0)
Monocytes Absolute: 0.4 10*3/uL (ref 0.1–1.0)
Monocytes Relative: 9 %
Neutro Abs: 2.2 10*3/uL (ref 1.7–7.7)
Neutrophils Relative %: 56 %
Platelet Count: 194 10*3/uL (ref 150–400)
RBC: 3.7 MIL/uL — ABNORMAL LOW (ref 4.22–5.81)
RDW: 12.6 % (ref 11.5–15.5)
WBC Count: 3.9 10*3/uL — ABNORMAL LOW (ref 4.0–10.5)
nRBC: 0 % (ref 0.0–0.2)

## 2022-05-03 NOTE — Telephone Encounter (Signed)
Per [place] : Dr. Benay Spice will like patient to return in 4 months for lab/lisa, patient advised that he will call a month before to schedule follow up appointment.

## 2022-05-03 NOTE — Progress Notes (Signed)
  [facility] OFFICE PROGRESS NOTE   Diagnosis: Iron deficiency anemia  INTERVAL HISTORY:   [name] returns as scheduled.  He continues to have pain in the left hip area.  He reports undergoing extensive diagnostic evaluation by orthopedics and neurosurgery.  He has received injection therapy.  Pain persists and is of unclear etiology. No bleeding.  Good appetite.  No other complaint.  Not taking iron for several months. A CBC at [facility] medicine on 04/02/2022 found to hemoglobin of 12.7 (13-17), WBC 5.0, MCV 95.7 (80-94), and platelets 217,000.  The [place] returned at 2.7 with an absolute lymphocyte count of 1.7.  A liver panel 04/02/2022 found the albumin at 4.9, bilirubin 0.7, alkaline phosphatase 89, AST 29, and ALT 31.   Objective:  Vital signs in last 24 hours:  Blood pressure 137/81, pulse 95, temperature 98.1 F (36.7 C), temperature source Oral, resp. rate 20, height '5\' 10"'$  (1.778 m), weight 183 lb 9.6 oz (83.3 kg), SpO2 100 %.    Lymphatics: No cervical, supraclavicular, axillary, or inguinal nodes Resp: Lungs clear bilaterally Cardio: Regular rate and rhythm 2/6 systolic murmur GI: No hepatosplenomegaly Vascular: No leg edema Musculoskeletal: No low back tenderness  Lab Results:  Lab Results  Component Value Date   WBC 3.9 (L) 05/03/2022   HGB 11.8 (L) 05/03/2022   HCT 35.8 (L) 05/03/2022   MCV 96.8 05/03/2022   PLT 194 05/03/2022   NEUTROABS 2.2 05/03/2022    CMP  Lab Results  Component Value Date   NA 136 08/02/2021   K 3.7 08/02/2021   CL 100 08/02/2021   CO2 28 08/02/2021   GLUCOSE 276 (H) 08/02/2021   BUN 13 08/02/2021   CREATININE 0.82 08/02/2021   CALCIUM 9.4 08/02/2021   PROT 7.5 08/02/2021   ALBUMIN 4.1 08/02/2021   AST 46 (H) 08/02/2021   ALT 56 (H) 08/02/2021   ALKPHOS 89 08/02/2021   BILITOT 0.8 08/02/2021   GFRNONAA >60 08/02/2021    Medications: I have reviewed the patient's current  medications.   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 pylori organisms; ascending colon, cecum, polyp-tubular adenomas; hepatic flexure polyp, tubular adenoma. 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  The hemoglobin remains mildly decreased.  The MCV is in the high normal range.  He does not have evidence of ongoing iron deficiency.  I recommended he take ferrous sulfate once daily.  He will return for an office visit and ferritin level in 4 months.  He will continue follow-up with orthopedics and neurosurgery for evaluation of back pain.   Betsy Coder, MD  05/03/2022  11:06 AM

## 2022-05-10 NOTE — Progress Notes (Signed)
I, Peterson Lombard, LAT, ATC acting as a scribe for [name], MD.  Subjective:    CC: L leg pain  HPI: Pt is an 81 y/o male c/o L leg pain. Pt underwent a lumbar laminectomy on 08/09/21 by Dr. Sherley Bounds and was d/c from PT on 03/15/22. Today, pt c/o L leg pain x 2 years, that's progressively worsening. Pt locates pain to the lateral aspect of the L hip and radiating distally along the lateral aspect of the thigh and sometimes to the anterior thigh.    He is already had an excellent trial of treatment with both orthopedics and with his spine surgeon and pain management provider.  He had a trial of a greater trochanter injection and a what sounds like diagnostic and therapeutic intra-articular injection which did not help much. Additionally he recently completed a trial of aquatic physical therapy in October that did not help as much as we would like.  Dr. Davy Pique does not think that his symptoms are coming from his spine.  Radiating pain: yes LE numbness/tingling: no LE weakness: no Aggravates: walking, L-side laying Treatments tried: PT, hip injections, "spinal injections," "nerve blocks," naproxen, ice, rest, stretching  Dx imaging: 12/18/21 L-spine myelogram  08/09/21 L-spine XR  03/16/21 L-spine XR   Pertinent review of Systems: No fevers or chills  Relevant historical information: Spinal decompression L2-L5   Objective:    Vitals:   05/15/22 1114  BP: (!) 194/108  Pulse: (!) 106  SpO2: 97%   General: Well Developed, well nourished, and in no acute distress.   MSK: Left hip: Normal-appearing Tender palpation greater trochanter.  Significant reduced hip abduction and external rotation strength.  This activity does reproduce pain.  Lab and Radiology Results XAM: LUMBAR MYELOGRAM   CT LUMBAR MYELOGRAM   FLUOROSCOPY: Radiation Exposure Index (as provided by the fluoroscopic device): 12.4 mGy Kerma   PROCEDURE: After thorough discussion of risks and  benefits of the procedure including bleeding, infection, injury to nerves, blood vessels, adjacent structures as well as headache and CSF leak, written and oral informed consent was obtained. Consent was obtained by Dr. Fabiola Backer. Time out form was completed.   Patient was positioned prone on the fluoroscopy table. Local anesthesia was provided with 1% lidocaine without epinephrine after prepped and draped in the usual sterile fashion. Puncture was performed at L2-L3 using a 3 1/2 inch 22-gauge spinal needle via right paramedian approach. Using a single pass through the dura, the needle was placed within the thecal sac, with return of clear CSF. 15 mL of Isovue M-200 was injected into the thecal sac, with normal opacification of the nerve roots and cauda equina consistent with free flow within the subarachnoid space.   I personally performed the lumbar puncture and administered the intrathecal contrast. I also personally supervised acquisition of the myelogram images.   TECHNIQUE: Contiguous axial images were obtained through the lumbar spine after the intrathecal infusion of contrast. Coronal and sagittal reconstructions were obtained of the axial image sets.   COMPARISON:  MRI lumbar spine dated Oct 11, 2021.   FINDINGS: LUMBAR MYELOGRAM FINDINGS:   Unchanged chronic L5 compression deformity. Unchanged trace retrolisthesis at T12-L1 and 3 mm retrolisthesis at L2-L3. Unchanged trace anterolisthesis at L3-L4 and L4-L5. No dynamic instability. Small ventral extradural defects from L1-L2 through L4-L5. Prior L2-L5 posterior decompression. No high-grade spinal canal stenosis or nerve root effacement.   CT LUMBAR MYELOGRAM FINDINGS:   Segmentation: Standard.   Alignment: Unchanged trace retrolisthesis at  T12-L1 and L2-L3. Unchanged trace anterolisthesis at L3-L4 and L4-L5.   Vertebrae: No acute fracture or other focal pathologic process. Subacute to chronic nondisplaced  fracture of the L2 spinous process. Unchanged chronic severe L5 compression deformity.   Conus medullaris and cauda equina: Conus extends to the L1 level. There is mild distortion of the cauda equina nerve roots proximally, without clumping.   Paraspinal and other soft tissues: Aortoiliac atherosclerotic vascular disease.   Disc levels:   T12-L1: Unchanged mild disc bulging and bilateral facet arthropathy. Unchanged mild right neuroforaminal stenosis. No spinal canal or left neuroforaminal stenosis.   L1-L2: Unchanged mild disc bulging and bilateral facet arthropathy. Unchanged mild spinal canal stenosis. No neuroforaminal stenosis.   L2-L3: Prior posterior decompression. Unchanged moderate disc bulging and mild bilateral facet arthropathy. Unchanged moderate bilateral neuroforaminal stenosis. No spinal canal stenosis.   L3-L4: Prior posterior decompression. Unchanged mild disc bulging and bilateral facet arthropathy. Unchanged moderate bilateral neuroforaminal stenosis. No spinal canal stenosis.   L4-L5: Prior posterior decompression. Unchanged mild to moderate disc bulging and moderate bilateral facet arthropathy. Unchanged moderate bilateral neuroforaminal stenosis. No spinal canal stenosis.   L5-S1: Unchanged mild disc bulging and mild-to-moderate bilateral facet arthropathy. Unchanged moderate bilateral neuroforaminal stenosis. No spinal canal stenosis.   IMPRESSION: 1. Unchanged moderate multilevel lumbar spondylosis as described above, status post L2-L5 posterior decompression. Similar moderate bilateral neuroforaminal stenosis from L2-L3 to L5-S1. 2. Unchanged chronic severe L5 compression deformity. Subacute to chronic nondisplaced fracture of the L2 spinous process. 3. Aortic Atherosclerosis (ICD10-I70.0).     Electronically Signed   By: Titus Dubin M.D.   On: 12/18/2021 12:15  EXAM: MR OF THE LEFT HIP WITHOUT CONTRAST  TECHNIQUE: Multiplanar,  multisequence MR imaging was performed. No intravenous contrast was administered.  COMPARISON: None Available.  FINDINGS: Bones:  No hip fracture, dislocation or avascular necrosis.  No periosteal reaction or bone destruction. No aggressive osseous lesion.  Normal sacrum and sacroiliac joints. No SI joint widening or erosive changes.  Chronic L5 vertebral body compression fracture with mild marrow edema within the vertebral body. Edema within the paraspinal musculature more severe on the right with associated atrophy of the right multifidus muscle. This may reflect an element of myositis or muscle strain versus neurogenic edema.  Articular cartilage and labrum  Articular cartilage: Partial-thickness cartilage loss of the femoral head and acetabulum bilaterally.  Labrum: Left labral degeneration with a tear of the superior anterior labrum.  Joint or bursal effusion  Joint effusion: No hip joint effusion. No SI joint effusion.  Bursae: No bursa formation.  Muscles and tendons  Flexors: Normal.  Extensors: Normal.  Abductors: Normal.  Adductors: Normal.  Gluteals: Normal.  Hamstrings: Normal.  Other findings  No pelvic free fluid. No fluid collection or hematoma. No inguinal lymphadenopathy. No inguinal hernia.  IMPRESSION: 1. Edema within the paraspinal musculature more severe on the right with associated atrophy of the right multifidus muscle. This may reflect an element of myositis or muscle strain versus neurogenic edema. 2. Mild osteoarthritis of bilateral hips. 3. Left labral degeneration with a tear of the superior anterior labrum.   Electronically Signed By: Kathreen Devoid M.D. On: 10/12/2021 06:23  I, [name], personally (independently) visualized and performed the interpretation of the MRI images attached in this note.   Impression and Recommendations:    Assessment and Plan: 81 y.o. male with left lateral hip and thigh pain.  Majority  of pain thought to be related to hip abductor weakness.  Some of  the weakness could be because of deconditioning causing tendinopathy and irritation.  Some of the weakness could be because of some unrecovered nerve function due to history of nerve compression in his spine.  He is already had a good trial of physical therapy but he has not had a trial of iontophoresis nor has he had a trial of shockwave therapy.  Will reorder physical therapy focused on strengthening of his hip abductors and adding iontophoresis.  Additionally I will schedule him for extracorporeal shockwave therapy which could help.  He may benefit from a EMG/nerve conduction study.  PDMP not reviewed this encounter. Orders Placed This Encounter  Procedures   Ambulatory referral to Physical Therapy    Referral Priority:   Routine    Referral Type:   Physical Medicine    Referral Reason:   Specialty Services Required    Requested Specialty:   Physical Therapy    Number of Visits Requested:   1   No orders of the defined types were placed in this encounter.   Discussed warning signs or symptoms. Please see discharge instructions. Patient expresses understanding.   The above documentation has been reviewed and is accurate and complete [name], M.D.

## 2022-05-15 ENCOUNTER — Ambulatory Visit (INDEPENDENT_AMBULATORY_CARE_PROVIDER_SITE_OTHER): Payer: Medicare Other | Admitting: Family Medicine

## 2022-05-15 ENCOUNTER — Ambulatory Visit: Payer: Self-pay

## 2022-05-15 VITALS — BP 194/108 | HR 106 | Ht 70.0 in | Wt 186.0 lb

## 2022-05-15 DIAGNOSIS — M5416 Radiculopathy, lumbar region: Secondary | ICD-10-CM | POA: Diagnosis not present

## 2022-05-15 DIAGNOSIS — M7062 Trochanteric bursitis, left hip: Secondary | ICD-10-CM

## 2022-05-15 NOTE — Patient Instructions (Signed)
Thank you for coming in today.   Schedule with me for Shockwave for the hip.   I've referred you to Physical Therapy.  Let us know if you don't hear from them in one week.   We will try to get this incrementally better.

## 2022-05-17 ENCOUNTER — Ambulatory Visit (INDEPENDENT_AMBULATORY_CARE_PROVIDER_SITE_OTHER): Payer: Self-pay | Admitting: Family Medicine

## 2022-05-17 DIAGNOSIS — M7062 Trochanteric bursitis, left hip: Secondary | ICD-10-CM

## 2022-05-17 NOTE — Progress Notes (Signed)
   [facility] [place] Phone: [phone]   Extracorporeal Shockwave Therapy Note    Patient is being treated today with ECSWT. Informed consent was obtained and patient tolerated procedure well.   Therapy performed by [name]  Condition treated: Left hip trochanteric bursitis/tendinitis Treatment preset used: Trochanteric bursitis Energy used: 90 mJ Frequency used: 10 Hz Number of pulses: 2000 Treatment #1 of #4  Electronically signed by:  [facility] 10:27 AM 05/17/22

## 2022-05-18 ENCOUNTER — Ambulatory Visit (INDEPENDENT_AMBULATORY_CARE_PROVIDER_SITE_OTHER): Payer: Medicare Other | Admitting: Physical Therapy

## 2022-05-18 ENCOUNTER — Encounter: Payer: Self-pay | Admitting: Physical Therapy

## 2022-05-18 DIAGNOSIS — M25552 Pain in left hip: Secondary | ICD-10-CM | POA: Diagnosis not present

## 2022-05-18 DIAGNOSIS — R262 Difficulty in walking, not elsewhere classified: Secondary | ICD-10-CM

## 2022-05-18 DIAGNOSIS — M5459 Other low back pain: Secondary | ICD-10-CM

## 2022-05-18 DIAGNOSIS — M6281 Muscle weakness (generalized): Secondary | ICD-10-CM | POA: Diagnosis not present

## 2022-05-18 NOTE — Therapy (Signed)
OUTPATIENT PHYSICAL THERAPY EVALUATION   Patient Name: Benjamin Davila MRN: [id number] DOB:12-29-1940, 81 y.o., male Today's Date: 05/18/2022   PT End of Session - 05/18/22 1013     Visit Number 1    Number of Visits 16    Date for PT Re-Evaluation 07/13/22    Authorization Type BCBS Medicare    PT Start Time 1017    PT Stop Time 1100    PT Time Calculation (min) 43 min    Activity Tolerance Patient tolerated treatment well    Behavior During Therapy [facility] for tasks assessed/performed             Past Medical History:  Diagnosis Date   Aortic regurgitation 07/14/2020   Aortic stenosis, moderate 03/17/2019   Autoimmune hepatitis ([place]) 07/12/2020   Benign prostatic hyperplasia without lower urinary tract symptoms 07/12/2020   Cardiac murmur 12/05/2018   Diabetes mellitus due to underlying condition with unspecified complications ([place]) 65/07/5463   Essential hypertension 12/05/2018   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   History of pancreatitis 07/12/2020   History of pancytopenia 07/12/2020   Hyperlipidemia, unspecified 07/12/2020   Irregular heart beat 07/12/2020   Long term (current) use of insulin ([place]) 07/12/2020   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   Lumbar radiculopathy 06/17/2020   Lumbar spondylosis 07/28/2020   Malignant neoplasm of prostate ([place]) 06/24/2019   Mood disorder ([place]) 07/12/2020   Pain of left hip joint 08/25/2019   PONV (postoperative nausea and vomiting)    Prostate cancer ([place])    Spinal stenosis of lumbar region 68/04/7516   Systolic murmur 00/17/4944   Trochanteric bursitis of left hip 03/12/2021   Trochanteric bursitis of right hip 03/12/2021   Type 2 diabetes mellitus with other specified complication ([place]) 96/75/9163   Past Surgical History:  Procedure Laterality Date   BACK SURGERY     lumbar surgery 20 years ago per patient   CHOLECYSTECTOMY     20 years ago per patient   LUMBAR LAMINECTOMY/DECOMPRESSION MICRODISCECTOMY Bilateral  08/09/2021   Procedure: Lumbar decompressive laminectomy  - Lumbar two-Lumabr three - Lumbar three-Lumbar four - Lumbar four-Lumbar five - Lumabr five-Sacal one - Bilateral, Posterior Lateral Fusion Lumbar three-four-Right;  Surgeon: Eustace Moore, MD;  Location: [facility];  Service: Neurosurgery;  Laterality: Bilateral;   TONSILLECTOMY     as a kid   Patient Active Problem List   Diagnosis Date Noted   S/P lumbar laminectomy 08/09/2021   Trochanteric bursitis of left hip 03/12/2021   Trochanteric bursitis of right hip 03/12/2021   Lumbar spondylosis 07/28/2020   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   Aortic regurgitation 07/14/2020   Autoimmune hepatitis ([place]) 07/12/2020   Benign prostatic hyperplasia without lower urinary tract symptoms 07/12/2020   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   History of pancreatitis 07/12/2020   History of pancytopenia 07/12/2020   Hyperlipidemia, unspecified 07/12/2020   Irregular heart beat 07/12/2020   Long term (current) use of insulin ([place]) 07/12/2020   Mood disorder ([place]) 84/66/5993   Systolic murmur 57/05/7791   Type 2 diabetes mellitus with other specified complication ([place]) 90/30/0923   Prostate cancer ([place])    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   Lumbar radiculopathy 06/17/2020   Pain of left hip joint 08/25/2019   Malignant neoplasm of prostate ([place]) 06/24/2019   Aortic stenosis, moderate 03/17/2019   Cardiac murmur 12/05/2018   Essential hypertension 12/05/2018   Diabetes mellitus due to underlying condition with unspecified complications ([place]) 30/11/6224    PCP: Jillyn Ledger, FNP  REFERRING PROVIDER:  [name]   REFERRING DIAG: L hip pain/trochanteric bursitis  Rationale for Evaluation and Treatment Rehabilitation  THERAPY DIAG:  Pain in left hip  Other low back pain  Muscle weakness (generalized)  ONSET DATE: Lumbar Laminectomy & Microdiskectomy on 08/09/21  SUBJECTIVE:                                                                                                                                                                                            SUBJECTIVE STATEMENT: Pt has had ongoing pain in hip for a few years now. He has had back surgery in March 2023. He continues to have ongoing pain and dysfunction/weakness in L hip. Has had MRI of hip, and has had other/aquatic therapy as well with little relief. He has a 2 wheeled walker at home, today is not using any AD.    PERTINENT HISTORY:  Chronic hip and LBP, previous PT for low back  PAIN:  Are you having pain? Yes: NPRS scale: severe and limiting/10 Pain location: across low back and into L hip  Pain description: it hurts Aggravating factors: use of leg, walking,  Relieving factors: none stated    PRECAUTIONS: Fall  WEIGHT BEARING RESTRICTIONS No  FALLS:  Has patient fallen in last 6 months? No  LIVING ENVIRONMENT: Stairs at home- steps to get in from garage with hand rails, stairs in home with bedroom downstairs  OCCUPATION: retired  PLOF: [place]  decreased pain in hip   OBJECTIVE:    SCREENING FOR RED FLAGS: No findings  COGNITION:  Overall cognitive status: Within functional limits for tasks assessed     SENSATION: WFL   Palpation:  most pain at gr troch, glute med, piriformis,  states pain can come down into anterior thigh with standing and walking.     LUMBAR ROM:   Active  AROM  eval  Flexion Mild limitation  Extension Slightly past neutral  Right lateral flexion   Left lateral flexion   Right rotation   Left rotation    (Blank rows = not tested)  Hip ROM:   Hip flex: mild limitation, IR/ ER: mild limitation  LOWER EXTREMITY MMT:    Hip strength L R  Flexion 3- 4-  Extension    Abduction 3- 3+                 TODAY'S TREATMENT  Ther ex: see below for HEP   PATIENT EDUCATION:  Education details: PT POC, Exam findings, HEP, Extensive discussion on weakness of L hip,  and benefits of strengthening for improving pain and function.  Person educated: Patient and  Spouse Education method: Explanation, Demonstration, Tactile cues, Verbal cues, and Handouts Education comprehension: verbalized understanding, returned demonstration, verbal cues required, tactile cues required, and needs further education   HOME EXERCISE PROGRAM: [email or web address]     old   Access Code: [email or web address]   new  URL: [email or web address] Date: 05/18/2022 Prepared by: Lyndee Hensen  Exercises - Straight Leg Raise  - 1 x daily - 2 sets - 5 reps - Sidelying Hip Abduction  - 1 x daily - 2 sets - 5 reps - 1 sec  hold - Clamshell  - 1 x daily - 1 sets - 10 reps   ASSESSMENT:  CLINICAL IMPRESSION: Patient  presents with primary complaint of ongoing, significant pain in L hip. He has had hip deficits for 2 years now. Today, he has severe weakness of L hip, that seems to be main limiting factor. He has poor gait mechanics, poor ability for stairs and functional activity due to weakness and instability and pain. He is not currently doing focused strengthening for his current deficits. He will benefit from skilled PT for focused attention to hip strength, function, gait, and hip pain relief.    OBJECTIVE IMPAIRMENTS Abnormal gait, decreased activity tolerance, decreased balance, decreased mobility, difficulty walking, decreased ROM, decreased strength, increased muscle spasms, impaired flexibility, improper body mechanics, postural dysfunction, and pain.   ACTIVITY LIMITATIONS carrying, lifting, bending, sitting, standing, stairs, transfers, and locomotion level  PARTICIPATION LIMITATIONS: meal prep, cleaning, laundry, shopping, community activity, and yard work  PERSONAL FACTORS Past/current experiences, Time since onset of injury/illness/exacerbation, and 1-2 comorbidities: trochanteric bursitis, progression of ambulation limitations  are also affecting patient's functional outcome.    REHAB POTENTIAL: Good  CLINICAL DECISION MAKING: Evolving/moderate complexity  EVALUATION COMPLEXITY: Moderate   GOALS: Goals reviewed with patient? Yes  SHORT TERM GOALS: Target date: 06/01/2022    Pt to be independent with initial HEP Goal status: INITIAL   LONG TERM GOALS: Target date: 07/13/2022   Pt to be independent with final HEP Goal status: INITIAL   2.  Pt to demo improved strength of L hip to at least 4/5 to improve stability, gait pattern, and pain.   Goal status: INITIAL  3.  Pt to demo optimal gait mechanics with use of LRAD, to improve function and community navigation.   Goal status: INITIAL  4.  Able to safely navigate stairs, step-over-step and single hand rail use   Goal status: INITIAL    PLAN: PT FREQUENCY: 1-2x/week  PT DURATION: 8 weeks  PLANNED INTERVENTIONS: Therapeutic exercises, Therapeutic activity, Neuromuscular re-education, Balance training, Gait training, Patient/Family education, Self Care, Joint mobilization, Joint manipulation, Stair training, Aquatic Therapy, Dry Needling, Electrical stimulation, Spinal manipulation, Spinal mobilization, Cryotherapy, Moist heat, Taping, Traction, Ultrasound, Ionotophoresis '4mg'$ /ml Dexamethasone, Manual therapy, and Re-evaluation.  PLAN FOR NEXT SESSION:    Lyndee Hensen, PT, DPT 10:14 AM  05/18/22

## 2022-05-22 ENCOUNTER — Ambulatory Visit (INDEPENDENT_AMBULATORY_CARE_PROVIDER_SITE_OTHER): Payer: Medicare Other | Admitting: Physical Therapy

## 2022-05-22 DIAGNOSIS — R262 Difficulty in walking, not elsewhere classified: Secondary | ICD-10-CM

## 2022-05-22 DIAGNOSIS — M6281 Muscle weakness (generalized): Secondary | ICD-10-CM

## 2022-05-22 DIAGNOSIS — M25552 Pain in left hip: Secondary | ICD-10-CM | POA: Diagnosis not present

## 2022-05-22 DIAGNOSIS — M5459 Other low back pain: Secondary | ICD-10-CM | POA: Diagnosis not present

## 2022-05-22 NOTE — Therapy (Signed)
OUTPATIENT PHYSICAL THERAPY Treatment    Patient Name: Benjamin Davila MRN: [id number] DOB:1940/10/26, 82 y.o., male Today's Date: 05/22/2022     Past Medical History:  Diagnosis Date   Aortic regurgitation 07/14/2020   Aortic stenosis, moderate 03/17/2019   Autoimmune hepatitis ([place]) 07/12/2020   Benign prostatic hyperplasia without lower urinary tract symptoms 07/12/2020   Cardiac murmur 12/05/2018   Diabetes mellitus due to underlying condition with unspecified complications ([place]) 59/56/3875   Essential hypertension 12/05/2018   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   History of pancreatitis 07/12/2020   History of pancytopenia 07/12/2020   Hyperlipidemia, unspecified 07/12/2020   Irregular heart beat 07/12/2020   Long term (current) use of insulin ([place]) 07/12/2020   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   Lumbar radiculopathy 06/17/2020   Lumbar spondylosis 07/28/2020   Malignant neoplasm of prostate ([place]) 06/24/2019   Mood disorder ([place]) 07/12/2020   Pain of left hip joint 08/25/2019   PONV (postoperative nausea and vomiting)    Prostate cancer ([employer])    Spinal stenosis of lumbar region 64/33/2951   Systolic murmur 88/41/6606   Trochanteric bursitis of left hip 03/12/2021   Trochanteric bursitis of right hip 03/12/2021   Type 2 diabetes mellitus with other specified complication ([place]) 30/16/0109   Past Surgical History:  Procedure Laterality Date   BACK SURGERY     lumbar surgery 20 years ago per patient   CHOLECYSTECTOMY     20 years ago per patient   LUMBAR LAMINECTOMY/DECOMPRESSION MICRODISCECTOMY Bilateral 08/09/2021   Procedure: Lumbar decompressive laminectomy  - Lumbar two-Lumabr three - Lumbar three-Lumbar four - Lumbar four-Lumbar five - Lumabr five-Sacal one - Bilateral, Posterior Lateral Fusion Lumbar three-four-Right;  Surgeon: Eustace Moore, MD;  Location: [facility];  Service: Neurosurgery;  Laterality: Bilateral;   TONSILLECTOMY     as a kid   Patient Active  Problem List   Diagnosis Date Noted   S/P lumbar laminectomy 08/09/2021   Trochanteric bursitis of left hip 03/12/2021   Trochanteric bursitis of right hip 03/12/2021   Lumbar spondylosis 07/28/2020   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   Aortic regurgitation 07/14/2020   Autoimmune hepatitis ([place]) 07/12/2020   Benign prostatic hyperplasia without lower urinary tract symptoms 07/12/2020   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   History of pancreatitis 07/12/2020   History of pancytopenia 07/12/2020   Hyperlipidemia, unspecified 07/12/2020   Irregular heart beat 07/12/2020   Long term (current) use of insulin ([place]) 07/12/2020   Mood disorder ([place]) 32/35/5732   Systolic murmur 20/25/4270   Type 2 diabetes mellitus with other specified complication ([place]) 62/37/6283   Prostate cancer ([place])    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   Lumbar radiculopathy 06/17/2020   Pain of left hip joint 08/25/2019   Malignant neoplasm of prostate ([place]) 06/24/2019   Aortic stenosis, moderate 03/17/2019   Cardiac murmur 12/05/2018   Essential hypertension 12/05/2018   Diabetes mellitus due to underlying condition with unspecified complications ([place]) 15/17/6160    PCP: Jillyn Ledger, FNP  REFERRING PROVIDER: [name]   REFERRING DIAG: L hip pain/trochanteric bursitis  Rationale for Evaluation and Treatment Rehabilitation  THERAPY DIAG:  No diagnosis found.  ONSET DATE: Lumbar Laminectomy & Microdiskectomy on 08/09/21  SUBJECTIVE:  SUBJECTIVE STATEMENT: Pt has had ongoing pain in hip for a few years now. He has had back surgery in March 2023. He continues to have ongoing pain and dysfunction/weakness in L hip. Has had MRI of hip, and has had other/aquatic therapy as well with little relief. He has a 2 wheeled  walker at home, today is not using any AD.    PERTINENT HISTORY:  Chronic hip and LBP, previous PT for low back  PAIN:  Are you having pain? Yes: NPRS scale: severe and limiting/10 Pain location: across low back and into L hip  Pain description: it hurts Aggravating factors: use of leg, walking,  Relieving factors: none stated    PRECAUTIONS: Fall  WEIGHT BEARING RESTRICTIONS No  FALLS:  Has patient fallen in last 6 months? No  LIVING ENVIRONMENT: Stairs at home- steps to get in from garage with hand rails, stairs in home with bedroom downstairs  OCCUPATION: retired  PLOF: [place]  decreased pain in hip   OBJECTIVE:    SCREENING FOR RED FLAGS: No findings  COGNITION:  Overall cognitive status: Within functional limits for tasks assessed     SENSATION: WFL   Palpation:  most pain at gr troch, glute med, piriformis,  states pain can come down into anterior thigh with standing and walking.     LUMBAR ROM:   Active  AROM  eval  Flexion Mild limitation  Extension Slightly past neutral  Right lateral flexion   Left lateral flexion   Right rotation   Left rotation    (Blank rows = not tested)  Hip ROM:   Hip flex: mild limitation, IR/ ER: mild limitation  LOWER EXTREMITY MMT:    Hip strength L R  Flexion 3- 4-  Extension    Abduction 3- 3+                 TODAY'S TREATMENT  Ther ex: see below for HEP   PATIENT EDUCATION:  Education details: PT POC, Exam findings, HEP, Extensive discussion on weakness of L hip, and benefits of strengthening for improving pain and function.  Person educated: Patient and Spouse Education method: Explanation, Demonstration, Tactile cues, Verbal cues, and Handouts Education comprehension: verbalized understanding, returned demonstration, verbal cues required, tactile cues required, and needs further education   HOME EXERCISE PROGRAM: [email or web address]     old   Access Code: [email or web address]   new  URL:  [email or web address] Date: 05/18/2022 Prepared by: Lyndee Hensen  Exercises - Straight Leg Raise  - 1 x daily - 2 sets - 5 reps - Sidelying Hip Abduction  - 1 x daily - 2 sets - 5 reps - 1 sec  hold - Clamshell  - 1 x daily - 1 sets - 10 reps   ASSESSMENT:  CLINICAL IMPRESSION: Patient  presents with primary complaint of ongoing, significant pain in L hip. He has had hip deficits for 2 years now. Today, he has severe weakness of L hip, that seems to be main limiting factor. He has poor gait mechanics, poor ability for stairs and functional activity due to weakness and instability and pain. He is not currently doing focused strengthening for his current deficits. He will benefit from skilled PT for focused attention to hip strength, function, gait, and hip pain relief.    OBJECTIVE IMPAIRMENTS Abnormal gait, decreased activity tolerance, decreased balance, decreased mobility, difficulty walking, decreased ROM, decreased strength, increased muscle spasms, impaired flexibility, improper body mechanics, postural dysfunction, and pain.   ACTIVITY  LIMITATIONS carrying, lifting, bending, sitting, standing, stairs, transfers, and locomotion level  PARTICIPATION LIMITATIONS: meal prep, cleaning, laundry, shopping, community activity, and yard work  PERSONAL FACTORS Past/current experiences, Time since onset of injury/illness/exacerbation, and 1-2 comorbidities: trochanteric bursitis, progression of ambulation limitations  are also affecting patient's functional outcome.   REHAB POTENTIAL: Good  CLINICAL DECISION MAKING: Evolving/moderate complexity  EVALUATION COMPLEXITY: Moderate   GOALS: Goals reviewed with patient? Yes  SHORT TERM GOALS: Target date: 06/01/2022    Pt to be independent with initial HEP Goal status: INITIAL   LONG TERM GOALS: Target date: 07/13/2022   Pt to be independent with final HEP Goal status: INITIAL   2.  Pt to demo improved strength of  L hip to at least 4/5 to improve stability, gait pattern, and pain.   Goal status: INITIAL  3.  Pt to demo optimal gait mechanics with use of LRAD, to improve function and community navigation.   Goal status: INITIAL  4.  Able to safely navigate stairs, step-over-step and single hand rail use   Goal status: INITIAL    PLAN: PT FREQUENCY: 1-2x/week  PT DURATION: 8 weeks  PLANNED INTERVENTIONS: Therapeutic exercises, Therapeutic activity, Neuromuscular re-education, Balance training, Gait training, Patient/Family education, Self Care, Joint mobilization, Joint manipulation, Stair training, Aquatic Therapy, Dry Needling, Electrical stimulation, Spinal manipulation, Spinal mobilization, Cryotherapy, Moist heat, Taping, Traction, Ultrasound, Ionotophoresis '4mg'$ /ml Dexamethasone, Manual therapy, and Re-evaluation.  PLAN FOR NEXT SESSION:    Lyndee Hensen, PT, DPT 4:27 PM  05/22/22

## 2022-05-24 ENCOUNTER — Encounter: Payer: Self-pay | Admitting: Physical Therapy

## 2022-05-24 ENCOUNTER — Ambulatory Visit (INDEPENDENT_AMBULATORY_CARE_PROVIDER_SITE_OTHER): Payer: Self-pay | Admitting: Family Medicine

## 2022-05-24 ENCOUNTER — Ambulatory Visit (INDEPENDENT_AMBULATORY_CARE_PROVIDER_SITE_OTHER): Payer: Medicare Other | Admitting: Physical Therapy

## 2022-05-24 DIAGNOSIS — M6281 Muscle weakness (generalized): Secondary | ICD-10-CM | POA: Diagnosis not present

## 2022-05-24 DIAGNOSIS — M25552 Pain in left hip: Secondary | ICD-10-CM

## 2022-05-24 DIAGNOSIS — M5459 Other low back pain: Secondary | ICD-10-CM | POA: Diagnosis not present

## 2022-05-24 DIAGNOSIS — M7062 Trochanteric bursitis, left hip: Secondary | ICD-10-CM

## 2022-05-24 DIAGNOSIS — R262 Difficulty in walking, not elsewhere classified: Secondary | ICD-10-CM

## 2022-05-24 NOTE — Progress Notes (Signed)
   Ben[facility] [place] Phone: [phone]   Extracorporeal Shockwave Therapy Note    Patient is being treated today with ECSWT. Informed consent was obtained and patient tolerated procedure well.   Therapy performed by [name]  Condition treated: Left trochanteric bursitis Treatment preset used: Trochanteric bursitis Energy used: 90 mJ Frequency used: 10 Hz Number of pulses: 2000 Treatment #2 of #4  Electronically signed by:  [facility] 12:45 PM 05/24/22

## 2022-05-24 NOTE — Patient Instructions (Signed)
Thank you for coming in today.   Recheck in 1 week.   Keep going with PT.

## 2022-05-24 NOTE — Therapy (Signed)
OUTPATIENT PHYSICAL THERAPY Treatment    Patient Name: Benjamin Davila MRN: [id number] DOB:1940-11-26, 82 y.o., male Today's Date: 05/24/2022   PT End of Session - 05/24/22 1242     Visit Number 3    Number of Visits 16    Date for PT Re-Evaluation 07/13/22    Authorization Type BCBS Medicare    PT Start Time 5176    PT Stop Time 1607    PT Time Calculation (min) 48 min    Activity Tolerance Patient tolerated treatment well    Behavior During Therapy [facility] for tasks assessed/performed              Past Medical History:  Diagnosis Date   Aortic regurgitation 07/14/2020   Aortic stenosis, moderate 03/17/2019   Autoimmune hepatitis ([place]) 07/12/2020   Benign prostatic hyperplasia without lower urinary tract symptoms 07/12/2020   Cardiac murmur 12/05/2018   Diabetes mellitus due to underlying condition with unspecified complications ([place]) 37/02/6268   Essential hypertension 12/05/2018   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   History of pancreatitis 07/12/2020   History of pancytopenia 07/12/2020   Hyperlipidemia, unspecified 07/12/2020   Irregular heart beat 07/12/2020   Long term (current) use of insulin ([place]) 07/12/2020   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   Lumbar radiculopathy 06/17/2020   Lumbar spondylosis 07/28/2020   Malignant neoplasm of prostate ([place]) 06/24/2019   Mood disorder ([place]) 07/12/2020   Pain of left hip joint 08/25/2019   PONV (postoperative nausea and vomiting)    Prostate cancer ([place])    Spinal stenosis of lumbar region 48/54/6270   Systolic murmur 35/00/9381   Trochanteric bursitis of left hip 03/12/2021   Trochanteric bursitis of right hip 03/12/2021   Type 2 diabetes mellitus with other specified complication ([place]) 82/99/3716   Past Surgical History:  Procedure Laterality Date   BACK SURGERY     lumbar surgery 20 years ago per patient   CHOLECYSTECTOMY     20 years ago per patient   LUMBAR LAMINECTOMY/DECOMPRESSION MICRODISCECTOMY Bilateral  08/09/2021   Procedure: Lumbar decompressive laminectomy  - Lumbar two-Lumabr three - Lumbar three-Lumbar four - Lumbar four-Lumbar five - Lumabr five-Sacal one - Bilateral, Posterior Lateral Fusion Lumbar three-four-Right;  Surgeon: Eustace Moore, MD;  Location: [place];  Service: Neurosurgery;  Laterality: Bilateral;   TONSILLECTOMY     as a kid   Patient Active Problem List   Diagnosis Date Noted   S/P lumbar laminectomy 08/09/2021   Trochanteric bursitis of left hip 03/12/2021   Trochanteric bursitis of right hip 03/12/2021   Lumbar spondylosis 07/28/2020   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   Aortic regurgitation 07/14/2020   Autoimmune hepatitis ([place]) 07/12/2020   Benign prostatic hyperplasia without lower urinary tract symptoms 07/12/2020   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   History of pancreatitis 07/12/2020   History of pancytopenia 07/12/2020   Hyperlipidemia, unspecified 07/12/2020   Irregular heart beat 07/12/2020   Long term (current) use of insulin ([place]) 07/12/2020   Mood disorder ([place]) 96/78/9381   Systolic murmur 01/75/1025   Type 2 diabetes mellitus with other specified complication ([place]) 85/27/7824   Prostate cancer ([place])    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   Lumbar radiculopathy 06/17/2020   Pain of left hip joint 08/25/2019   Malignant neoplasm of prostate ([place]) 06/24/2019   Aortic stenosis, moderate 03/17/2019   Cardiac murmur 12/05/2018   Essential hypertension 12/05/2018   Diabetes mellitus due to underlying condition with unspecified complications ([place]) 23/53/6144    PCP: Jillyn Ledger, FNP  REFERRING PROVIDER: [name]   REFERRING DIAG: L hip pain/trochanteric bursitis  Rationale for Evaluation and Treatment Rehabilitation  THERAPY DIAG:  Pain in left hip  Muscle weakness (generalized)  Other low back pain  Difficulty in walking, not elsewhere classified  ONSET DATE: Lumbar Laminectomy & Microdiskectomy on  08/09/21  SUBJECTIVE:                                                                                                                                                                                           SUBJECTIVE STATEMENT: 05/24/22: Pt states continued pain. Has been doing HEP.   Eval: Pt has had ongoing pain in hip for a few years now. He has had back surgery in March 2023. He continues to have ongoing pain and dysfunction/weakness in L hip. Has had MRI of hip, and has had other/aquatic therapy as well with little relief. He has a 2 wheeled walker at home, today is not using any AD.    PERTINENT HISTORY:  Chronic hip and LBP, previous PT for low back  PAIN:  Are you having pain? Yes: NPRS scale: severe and limiting/10 Pain location: across low back and into L hip  Pain description: it hurts Aggravating factors: use of leg, walking,  Relieving factors: none stated    PRECAUTIONS: Fall  WEIGHT BEARING RESTRICTIONS No  FALLS:  Has patient fallen in last 6 months? No  LIVING ENVIRONMENT: Stairs at home- steps to get in from garage with hand rails, stairs in home with bedroom downstairs  OCCUPATION: retired  PLOF: [place]  decreased pain in hip   OBJECTIVE:    SCREENING FOR RED FLAGS: No findings  COGNITION:  Overall cognitive status: Within functional limits for tasks assessed     SENSATION: WFL   Palpation:  most pain at gr troch, glute med, piriformis,  states pain can come down into anterior thigh with standing and walking.     LUMBAR ROM:   Active  AROM  eval  Flexion Mild limitation  Extension Slightly past neutral  Right lateral flexion   Left lateral flexion   Right rotation   Left rotation    (Blank rows = not tested)  Hip ROM:   Hip flex: mild limitation, IR/ ER: mild limitation  LOWER EXTREMITY MMT:    Hip strength L R  Flexion 3- 4-  Extension    Abduction 3- 3+                 TODAY'S TREATMENT   05/24/21:  Therapeutic Exercise: Aerobic: Supine:  SLR x 10 bil; Clams Blue TB x 20; Pelvic tilts  x 20; Supine march with TA x 20;  S/L hip abd 2x 5 on L,  x 10 on R;  Clams x 15 bil;   Seated: Sit to stand x 10  Standing:  Toe taps on 4 in step x 10;  Step ups 4 in, on L,  2 UE support x 10;  Stretches:  Trial for hip flexor stretch, supine off side of table ( increased pain in Lateral hip )  Neuromuscular Re-education: Manual Therapy: STM/roller to quad/thigh, ITB and glute med, Long leg distraction for L hip,  post mobs for L hip.  Self Care:    PATIENT EDUCATION:  Education details: updated and reviewed HEP, ionto use, walker height, and use of tennis balls for walker.  Person educated: Patient and Spouse Education method: Explanation, Demonstration, Tactile cues, Verbal cues, and Handouts Education comprehension: verbalized understanding, returned demonstration, verbal cues required, tactile cues required, and needs further education   HOME EXERCISE PROGRAM: [email or web address]     old  Access Code: [email or web address]   new    ASSESSMENT:  CLINICAL IMPRESSION:  05/24/21  Pt with improved tolerance for activities today. Discussed sleeping posture for more comfortable position with pillow between knees on side, or knees bent up in hooklying/supine position. Pt to benefit from continued strengthening as tolerated.   Eval: Patient  presents with primary complaint of ongoing, significant pain in L hip. He has had hip deficits for 2 years now. Today, he has severe weakness of L hip, that seems to be main limiting factor. He has poor gait mechanics, poor ability for stairs and functional activity due to weakness and instability and pain. He is not currently doing focused strengthening for his current deficits. He will benefit from skilled PT for focused attention to hip strength, function, gait, and hip pain relief.    OBJECTIVE IMPAIRMENTS Abnormal gait, decreased activity tolerance, decreased balance,  decreased mobility, difficulty walking, decreased ROM, decreased strength, increased muscle spasms, impaired flexibility, improper body mechanics, postural dysfunction, and pain.   ACTIVITY LIMITATIONS carrying, lifting, bending, sitting, standing, stairs, transfers, and locomotion level  PARTICIPATION LIMITATIONS: meal prep, cleaning, laundry, shopping, community activity, and yard work  PERSONAL FACTORS Past/current experiences, Time since onset of injury/illness/exacerbation, and 1-2 comorbidities: trochanteric bursitis, progression of ambulation limitations  are also affecting patient's functional outcome.   REHAB POTENTIAL: Good  CLINICAL DECISION MAKING: Evolving/moderate complexity  EVALUATION COMPLEXITY: Moderate   GOALS: Goals reviewed with patient? Yes  SHORT TERM GOALS: Target date: 06/01/2022    Pt to be independent with initial HEP Goal status: INITIAL   LONG TERM GOALS: Target date: 07/13/2022   Pt to be independent with final HEP Goal status: INITIAL   2.  Pt to demo improved strength of L hip to at least 4/5 to improve stability, gait pattern, and pain.   Goal status: INITIAL  3.  Pt to demo optimal gait mechanics with use of LRAD, to improve function and community navigation.   Goal status: INITIAL  4.  Able to safely navigate stairs, step-over-step and single hand rail use   Goal status: INITIAL    PLAN: PT FREQUENCY: 1-2x/week  PT DURATION: 8 weeks  PLANNED INTERVENTIONS: Therapeutic exercises, Therapeutic activity, Neuromuscular re-education, Balance training, Gait training, Patient/Family education, Self Care, Joint mobilization, Joint manipulation, Stair training, Aquatic Therapy, Dry Needling, Electrical stimulation, Spinal manipulation, Spinal mobilization, Cryotherapy, Moist heat, Taping, Traction, Ultrasound, Ionotophoresis '4mg'$ /ml Dexamethasone, Manual therapy, and Re-evaluation.  PLAN FOR NEXT SESSION:    Lyndee Hensen, PT,  DPT 1:24  PM  05/24/22

## 2022-05-28 ENCOUNTER — Ambulatory Visit (INDEPENDENT_AMBULATORY_CARE_PROVIDER_SITE_OTHER): Payer: Medicare Other | Admitting: Physical Therapy

## 2022-05-28 ENCOUNTER — Encounter: Payer: Self-pay | Admitting: Physical Therapy

## 2022-05-28 DIAGNOSIS — M5459 Other low back pain: Secondary | ICD-10-CM | POA: Diagnosis not present

## 2022-05-28 DIAGNOSIS — M6281 Muscle weakness (generalized): Secondary | ICD-10-CM | POA: Diagnosis not present

## 2022-05-28 DIAGNOSIS — M25552 Pain in left hip: Secondary | ICD-10-CM

## 2022-05-28 NOTE — Therapy (Signed)
OUTPATIENT PHYSICAL THERAPY Treatment    Patient Name: Benjamin Davila MRN: [id number] DOB:[birth year], 82 y.o., male Today's Date: 05/28/2022   PT End of Session - 05/28/22 1209     Visit Number 4    Number of Visits 16    Date for PT Re-Evaluation 07/13/22    Authorization Type BCBS Medicare    PT Start Time 1018    PT Stop Time 1100    PT Time Calculation (min) 42 min    Activity Tolerance Patient tolerated treatment well    Behavior During Therapy [facility] for tasks assessed/performed               Past Medical History:  Diagnosis Date   Aortic regurgitation 07/14/2020   Aortic stenosis, moderate 03/17/2019   Autoimmune hepatitis ([place]) 07/12/2020   Benign prostatic hyperplasia without lower urinary tract symptoms 07/12/2020   Cardiac murmur 12/05/2018   Diabetes mellitus due to underlying condition with unspecified complications ([place]) 94/17/4081   Essential hypertension 12/05/2018   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   History of pancreatitis 07/12/2020   History of pancytopenia 07/12/2020   Hyperlipidemia, unspecified 07/12/2020   Irregular heart beat 07/12/2020   Long term (current) use of insulin ([place]) 07/12/2020   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   Lumbar radiculopathy 06/17/2020   Lumbar spondylosis 07/28/2020   Malignant neoplasm of prostate ([place]) 06/24/2019   Mood disorder ([place]) 07/12/2020   Pain of left hip joint 08/25/2019   PONV (postoperative nausea and vomiting)    Prostate cancer ([place])    Spinal stenosis of lumbar region 44/81/8563   Systolic murmur 14/97/0263   Trochanteric bursitis of left hip 03/12/2021   Trochanteric bursitis of right hip 03/12/2021   Type 2 diabetes mellitus with other specified complication ([place]) 78/58/8502   Past Surgical History:  Procedure Laterality Date   BACK SURGERY     lumbar surgery 20 years ago per patient   CHOLECYSTECTOMY     20 years ago per patient   LUMBAR LAMINECTOMY/DECOMPRESSION MICRODISCECTOMY  Bilateral 08/09/2021   Procedure: Lumbar decompressive laminectomy  - Lumbar two-Lumabr three - Lumbar three-Lumbar four - Lumbar four-Lumbar five - Lumabr five-Sacal one - Bilateral, Posterior Lateral Fusion Lumbar three-four-Right;  Surgeon: Eustace Moore, MD;  Location: [place];  Service: Neurosurgery;  Laterality: Bilateral;   TONSILLECTOMY     as a kid   Patient Active Problem List   Diagnosis Date Noted   S/P lumbar laminectomy 08/09/2021   Trochanteric bursitis of left hip 03/12/2021   Trochanteric bursitis of right hip 03/12/2021   Lumbar spondylosis 07/28/2020   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   Aortic regurgitation 07/14/2020   Autoimmune hepatitis ([place]) 07/12/2020   Benign prostatic hyperplasia without lower urinary tract symptoms 07/12/2020   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   History of pancreatitis 07/12/2020   History of pancytopenia 07/12/2020   Hyperlipidemia, unspecified 07/12/2020   Irregular heart beat 07/12/2020   Long term (current) use of insulin ([place]) 07/12/2020   Mood disorder ([place]) 77/41/2878   Systolic murmur 67/67/2094   Type 2 diabetes mellitus with other specified complication ([place]) 70/96/2836   Prostate cancer ([place])    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   Lumbar radiculopathy 06/17/2020   Pain of left hip joint 08/25/2019   Malignant neoplasm of prostate ([place]) 06/24/2019   Aortic stenosis, moderate 03/17/2019   Cardiac murmur 12/05/2018   Essential hypertension 12/05/2018   Diabetes mellitus due to underlying condition with unspecified complications ([place]) 62/94/7654    PCP: Jillyn Ledger, FNP  REFERRING PROVIDER: [name]   REFERRING DIAG: L hip pain/trochanteric bursitis  Rationale for Evaluation and Treatment Rehabilitation  THERAPY DIAG:  Pain in left hip  Muscle weakness (generalized)  Other low back pain  ONSET DATE: Lumbar Laminectomy & Microdiskectomy on 08/09/21  SUBJECTIVE:                                                                                                                                                                                            SUBJECTIVE STATEMENT: 05/28/22: pt states continued pain.   Eval: Pt has had ongoing pain in hip for a few years now. He has had back surgery in March 2023. He continues to have ongoing pain and dysfunction/weakness in L hip. Has had MRI of hip, and has had other/aquatic therapy as well with little relief. He has a 2 wheeled walker at home, today is not using any AD.    PERTINENT HISTORY:  Chronic hip and LBP, previous PT for low back  PAIN:  Are you having pain? Yes: NPRS scale: severe and limiting/10 Pain location: across low back and into L hip  Pain description: it hurts Aggravating factors: use of leg, walking,  Relieving factors: none stated    PRECAUTIONS: Fall  WEIGHT BEARING RESTRICTIONS No  FALLS:  Has patient fallen in last 6 months? No  LIVING ENVIRONMENT: Stairs at home- steps to get in from garage with hand rails, stairs in home with bedroom downstairs  OCCUPATION: retired  PLOF: [place]  decreased pain in hip   OBJECTIVE:    SCREENING FOR RED FLAGS: No findings  COGNITION:  Overall cognitive status: Within functional limits for tasks assessed     SENSATION: WFL   Palpation:  most pain at gr troch, glute med, piriformis,  states pain can come down into anterior thigh with standing and walking.     LUMBAR ROM:   Active  AROM  eval  Flexion Mild limitation  Extension Slightly past neutral  Right lateral flexion   Left lateral flexion   Right rotation   Left rotation    (Blank rows = not tested)  Hip ROM:   Hip flex: mild limitation, IR/ ER: mild limitation  LOWER EXTREMITY MMT:    Hip strength L R  Flexion 3- 4-  Extension    Abduction 3- 3+                 TODAY'S TREATMENT   05/28/22: Therapeutic Exercise: Aerobic:  Supine:  Bridging x 10;   SLR x 10 bil;  Clams  Blue TB x 20;    Seated:  Standing:  ;  Step ups 4 in, on L,  1 UE support x 15;  Stretches:  Supine piriformis stretch (modified) x 2 bil; ER clams ROM x 15; SKTC 30 sec x 1 bil;  Neuromuscular Re-education: Manual Therapy:  Self Care:    05/24/21: Therapeutic Exercise: Aerobic: Supine:  SLR x 10 bil; Clams Blue TB x 20; Pelvic tilts x 20; Supine march with TA x 20;  S/L hip abd 2x 5 on L,  x 10 on R;  Clams x 15 bil;   Seated: Sit to stand x 10  Standing:  Toe taps on 4 in step x 10;  Step ups 4 in, on L,  2 UE support x 10;  Stretches:  Trial for hip flexor stretch, supine off side of table ( increased pain in Lateral hip )  Neuromuscular Re-education: Manual Therapy: STM/roller to quad/thigh, ITB and glute med, Long leg distraction for L hip,  post mobs for L hip.  Self Care:    PATIENT EDUCATION:  Education details: updated and reviewed HEP, ionto use, walker height, and use of tennis balls for walker.  Person educated: Patient and Spouse Education method: Explanation, Demonstration, Tactile cues, Verbal cues, and Handouts Education comprehension: verbalized understanding, returned demonstration, verbal cues required, tactile cues required, and needs further education   HOME EXERCISE PROGRAM: [email or web address]     old  Access Code: [email or web address]   new    ASSESSMENT:  CLINICAL IMPRESSION:  05/28/21  Pt with much improved ability for stair climbing with L LE today, as well as improved ability and reps (10) for SLR. Discussed continuing with current treatment/PT plan for optimal outcome with hip and ongoing pain, discussed expected time frame for improvements. Pt needs encouragement to stay with current plan.   Eval: Patient  presents with primary complaint of ongoing, significant pain in L hip. He has had hip deficits for 2 years now. Today, he has severe weakness of L hip, that seems to be main limiting factor. He has poor gait mechanics, poor ability for stairs and functional activity due  to weakness and instability and pain. He is not currently doing focused strengthening for his current deficits. He will benefit from skilled PT for focused attention to hip strength, function, gait, and hip pain relief.    OBJECTIVE IMPAIRMENTS Abnormal gait, decreased activity tolerance, decreased balance, decreased mobility, difficulty walking, decreased ROM, decreased strength, increased muscle spasms, impaired flexibility, improper body mechanics, postural dysfunction, and pain.   ACTIVITY LIMITATIONS carrying, lifting, bending, sitting, standing, stairs, transfers, and locomotion level  PARTICIPATION LIMITATIONS: meal prep, cleaning, laundry, shopping, community activity, and yard work  PERSONAL FACTORS Past/current experiences, Time since onset of injury/illness/exacerbation, and 1-2 comorbidities: trochanteric bursitis, progression of ambulation limitations  are also affecting patient's functional outcome.   REHAB POTENTIAL: Good  CLINICAL DECISION MAKING: Evolving/moderate complexity  EVALUATION COMPLEXITY: Moderate   GOALS: Goals reviewed with patient? Yes  SHORT TERM GOALS: Target date: 06/01/2022   Pt to be independent with initial HEP Goal status: INITIAL   LONG TERM GOALS: Target date: 07/13/2022   Pt to be independent with final HEP Goal status: INITIAL   2.  Pt to demo improved strength of L hip to at least 4/5 to improve stability, gait pattern, and pain.   Goal status: INITIAL  3.  Pt to demo optimal gait mechanics with use of LRAD, to improve function and community navigation.   Goal status: INITIAL  4.  Able to safely navigate stairs, step-over-step and single hand rail  use   Goal status: INITIAL    PLAN: PT FREQUENCY: 1-2x/week  PT DURATION: 8 weeks  PLANNED INTERVENTIONS: Therapeutic exercises, Therapeutic activity, Neuromuscular re-education, Balance training, Gait training, Patient/Family education, Self Care, Joint mobilization, Joint  manipulation, Stair training, Aquatic Therapy, Dry Needling, Electrical stimulation, Spinal manipulation, Spinal mobilization, Cryotherapy, Moist heat, Taping, Traction, Ultrasound, Ionotophoresis '4mg'$ /ml Dexamethasone, Manual therapy, and Re-evaluation.  PLAN FOR NEXT SESSION:    Lyndee Hensen, PT, DPT 2:20 PM  05/28/22

## 2022-05-31 ENCOUNTER — Ambulatory Visit (INDEPENDENT_AMBULATORY_CARE_PROVIDER_SITE_OTHER): Payer: Medicare Other | Admitting: Physical Therapy

## 2022-05-31 ENCOUNTER — Encounter: Payer: Self-pay | Admitting: Physical Therapy

## 2022-05-31 DIAGNOSIS — M6281 Muscle weakness (generalized): Secondary | ICD-10-CM | POA: Diagnosis not present

## 2022-05-31 DIAGNOSIS — M25552 Pain in left hip: Secondary | ICD-10-CM | POA: Diagnosis not present

## 2022-05-31 DIAGNOSIS — M5459 Other low back pain: Secondary | ICD-10-CM | POA: Diagnosis not present

## 2022-05-31 NOTE — Therapy (Signed)
OUTPATIENT PHYSICAL THERAPY Treatment    Patient Name: Benjamin Davila MRN: [id number] DOB:May 31, 1940, 82 y.o., male Today's Date: 05/31/2022   PT End of Session - 05/31/22 1032     Visit Number 5    Number of Visits 16    Date for PT Re-Evaluation 07/13/22    Authorization Type BCBS Medicare    PT Start Time 1020    PT Stop Time 1100    PT Time Calculation (min) 40 min    Activity Tolerance Patient tolerated treatment well    Behavior During Therapy [facility] for tasks assessed/performed                Past Medical History:  Diagnosis Date   Aortic regurgitation 07/14/2020   Aortic stenosis, moderate 03/17/2019   Autoimmune hepatitis ([place]) 07/12/2020   Benign prostatic hyperplasia without lower urinary tract symptoms 07/12/2020   Cardiac murmur 12/05/2018   Diabetes mellitus due to underlying condition with unspecified complications ([place]) 36/14/4315   Essential hypertension 12/05/2018   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   History of pancreatitis 07/12/2020   History of pancytopenia 07/12/2020   Hyperlipidemia, unspecified 07/12/2020   Irregular heart beat 07/12/2020   Long term (current) use of insulin ([place]) 07/12/2020   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   Lumbar radiculopathy 06/17/2020   Lumbar spondylosis 07/28/2020   Malignant neoplasm of prostate ([name]) 06/24/2019   Mood disorder ([place]) 07/12/2020   Pain of left hip joint 08/25/2019   PONV (postoperative nausea and vomiting)    Prostate cancer ([name])    Spinal stenosis of lumbar region 40/12/6759   Systolic murmur 95/01/3266   Trochanteric bursitis of left hip 03/12/2021   Trochanteric bursitis of right hip 03/12/2021   Type 2 diabetes mellitus with other specified complication ([place]) 12/45/8099   Past Surgical History:  Procedure Laterality Date   BACK SURGERY     lumbar surgery 20 years ago per patient   CHOLECYSTECTOMY     20 years ago per patient   LUMBAR LAMINECTOMY/DECOMPRESSION MICRODISCECTOMY  Bilateral 08/09/2021   Procedure: Lumbar decompressive laminectomy  - Lumbar two-Lumabr three - Lumbar three-Lumbar four - Lumbar four-Lumbar five - Lumabr five-Sacal one - Bilateral, Posterior Lateral Fusion Lumbar three-four-Right;  Surgeon: Eustace Moore, MD;  Location: [facility];  Service: Neurosurgery;  Laterality: Bilateral;   TONSILLECTOMY     as a kid   Patient Active Problem List   Diagnosis Date Noted   S/P lumbar laminectomy 08/09/2021   Trochanteric bursitis of left hip 03/12/2021   Trochanteric bursitis of right hip 03/12/2021   Lumbar spondylosis 07/28/2020   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   Aortic regurgitation 07/14/2020   Autoimmune hepatitis ([place]) 07/12/2020   Benign prostatic hyperplasia without lower urinary tract symptoms 07/12/2020   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   History of pancreatitis 07/12/2020   History of pancytopenia 07/12/2020   Hyperlipidemia, unspecified 07/12/2020   Irregular heart beat 07/12/2020   Long term (current) use of insulin ([place]) 07/12/2020   Mood disorder ([place]) 83/38/2505   Systolic murmur 39/76/7341   Type 2 diabetes mellitus with other specified complication ([place]) 93/79/0240   Prostate cancer ([place])    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   Lumbar radiculopathy 06/17/2020   Pain of left hip joint 08/25/2019   Malignant neoplasm of prostate ([place]) 06/24/2019   Aortic stenosis, moderate 03/17/2019   Cardiac murmur 12/05/2018   Essential hypertension 12/05/2018   Diabetes mellitus due to underlying condition with unspecified complications ([place]) 97/35/3299    PCP: Jillyn Ledger,  FNP  REFERRING PROVIDER: [name]   REFERRING DIAG: L hip pain/trochanteric bursitis  Rationale for Evaluation and Treatment Rehabilitation  THERAPY DIAG:  Pain in left hip  Muscle weakness (generalized)  Other low back pain  ONSET DATE: Lumbar Laminectomy & Microdiskectomy on 08/09/21  SUBJECTIVE:                                                                                                                                                                                            SUBJECTIVE STATEMENT: 05/31/22: pt states continued pain.   Eval: Pt has had ongoing pain in hip for a few years now. He has had back surgery in March 2023. He continues to have ongoing pain and dysfunction/weakness in L hip. Has had MRI of hip, and has had other/aquatic therapy as well with little relief. He has a 2 wheeled walker at home, today is not using any AD.    PERTINENT HISTORY:  Chronic hip and LBP, previous PT for low back  PAIN:  Are you having pain? Yes: NPRS scale: severe and limiting/10 Pain location: across low back and into L hip  Pain description: it hurts Aggravating factors: use of leg, walking,  Relieving factors: none stated    PRECAUTIONS: Fall  WEIGHT BEARING RESTRICTIONS No  FALLS:  Has patient fallen in last 6 months? No  LIVING ENVIRONMENT: Stairs at home- steps to get in from garage with hand rails, stairs in home with bedroom downstairs  OCCUPATION: retired  PLOF: [place]  decreased pain in hip   OBJECTIVE:    SCREENING FOR RED FLAGS: No findings  COGNITION:  Overall cognitive status: Within functional limits for tasks assessed     SENSATION: WFL   Palpation:  most pain at gr troch, glute med, piriformis,  states pain can come down into anterior thigh with standing and walking.     LUMBAR ROM:   Active  AROM  eval  Flexion Mild limitation  Extension Slightly past neutral  Right lateral flexion   Left lateral flexion   Right rotation   Left rotation    (Blank rows = not tested)  Hip ROM:   Hip flex: mild limitation, IR/ ER: mild limitation  LOWER EXTREMITY MMT:    Hip strength L R  Flexion 3- 4-  Extension    Abduction 3- 3+                 TODAY'S TREATMENT   05/31/22: Therapeutic Exercise: Aerobic:  Supine:  Bridging x 10;     Clams Blue TB x  20;    Seated:  Sit to  stand x 10, no UE support, higher mat table.  S/L : hip abd 2 x 10 (assist 5-10),  S/L clams x 15;  Standing:  Marching x 20; gait with RW, 45 ft x 4- cueing for upright posture, and sequencing/safety with changing directions.  Stretches:  Supine piriformis stretch (modified) x 2 bil; ER clams ROM x 15; SKTC 30 sec x 1 bil; LTR x 10, 5 sec holds.  Neuromuscular Re-education: Manual Therapy: long leg distraction bil x 2 min; S/L hip flexor/quad stretch;  Self Care: tennis balls given for walker legs.   05/28/22: Therapeutic Exercise: Aerobic:  Supine:  Bridging x 10;   SLR x 10 bil;  Clams Blue TB x 20;    Seated:  Standing:  ;  Step ups 4 in, on L,  1 UE support x 15;  Stretches:  Supine piriformis stretch (modified) x 2 bil; ER clams ROM x 15; SKTC 30 sec x 1 bil;  Neuromuscular Re-education: Manual Therapy:  Self Care:    05/24/21: Therapeutic Exercise: Aerobic: Supine:  SLR x 10 bil; Clams Blue TB x 20; Pelvic tilts x 20; Supine march with TA x 20;  S/L hip abd 2x 5 on L,  x 10 on R;  Clams x 15 bil;   Seated: Sit to stand x 10  Standing:  Toe taps on 4 in step x 10;  Step ups 4 in, on L,  2 UE support x 10;  Stretches:  Trial for hip flexor stretch, supine off side of table ( increased pain in Lateral hip )  Neuromuscular Re-education: Manual Therapy: STM/roller to quad/thigh, ITB and glute med, Long leg distraction for L hip,  post mobs for L hip.  Self Care:    PATIENT EDUCATION:  Education details: updated and reviewed HEP, ionto use, walker height, and use of tennis balls for walker.  Person educated: Patient and Spouse Education method: Explanation, Demonstration, Tactile cues, Verbal cues, and Handouts Education comprehension: verbalized understanding, returned demonstration, verbal cues required, tactile cues required, and needs further education   HOME EXERCISE PROGRAM: [email or web address]     old  Access Code: [email or web address]   new     ASSESSMENT:  CLINICAL IMPRESSION:  05/31/21  Pt with improved ability for standing marching today, as well as improved gait mechanics, using RW, with less limping today. He requires max cuing for performing ther ex with optimal form. Plan to progress strength as tolerated.   Eval: Patient  presents with primary complaint of ongoing, significant pain in L hip. He has had hip deficits for 2 years now. Today, he has severe weakness of L hip, that seems to be main limiting factor. He has poor gait mechanics, poor ability for stairs and functional activity due to weakness and instability and pain. He is not currently doing focused strengthening for his current deficits. He will benefit from skilled PT for focused attention to hip strength, function, gait, and hip pain relief.    OBJECTIVE IMPAIRMENTS Abnormal gait, decreased activity tolerance, decreased balance, decreased mobility, difficulty walking, decreased ROM, decreased strength, increased muscle spasms, impaired flexibility, improper body mechanics, postural dysfunction, and pain.   ACTIVITY LIMITATIONS carrying, lifting, bending, sitting, standing, stairs, transfers, and locomotion level  PARTICIPATION LIMITATIONS: meal prep, cleaning, laundry, shopping, community activity, and yard work  PERSONAL FACTORS Past/current experiences, Time since onset of injury/illness/exacerbation, and 1-2 comorbidities: trochanteric bursitis, progression of ambulation limitations  are also affecting patient's functional outcome.   REHAB POTENTIAL: Good  CLINICAL DECISION MAKING:  Evolving/moderate complexity  EVALUATION COMPLEXITY: Moderate   GOALS: Goals reviewed with patient? Yes  SHORT TERM GOALS: Target date: 06/01/2022   Pt to be independent with initial HEP Goal status: INITIAL   LONG TERM GOALS: Target date: 07/13/2022   Pt to be independent with final HEP Goal status: INITIAL   2.  Pt to demo improved strength of L hip to at least  4/5 to improve stability, gait pattern, and pain.   Goal status: INITIAL  3.  Pt to demo optimal gait mechanics with use of LRAD, to improve function and community navigation.   Goal status: INITIAL  4.  Able to safely navigate stairs, step-over-step and single hand rail use   Goal status: INITIAL    PLAN: PT FREQUENCY: 1-2x/week  PT DURATION: 8 weeks  PLANNED INTERVENTIONS: Therapeutic exercises, Therapeutic activity, Neuromuscular re-education, Balance training, Gait training, Patient/Family education, Self Care, Joint mobilization, Joint manipulation, Stair training, Aquatic Therapy, Dry Needling, Electrical stimulation, Spinal manipulation, Spinal mobilization, Cryotherapy, Moist heat, Taping, Traction, Ultrasound, Ionotophoresis '4mg'$ /ml Dexamethasone, Manual therapy, and Re-evaluation.  PLAN FOR NEXT SESSION:    Lyndee Hensen, PT, DPT 12:10 PM  05/31/22

## 2022-06-01 ENCOUNTER — Ambulatory Visit (INDEPENDENT_AMBULATORY_CARE_PROVIDER_SITE_OTHER): Payer: Medicare Other | Admitting: Family Medicine

## 2022-06-01 VITALS — Wt 186.0 lb

## 2022-06-01 DIAGNOSIS — M5416 Radiculopathy, lumbar region: Secondary | ICD-10-CM

## 2022-06-01 DIAGNOSIS — M7062 Trochanteric bursitis, left hip: Secondary | ICD-10-CM | POA: Diagnosis not present

## 2022-06-01 NOTE — Patient Instructions (Signed)
Thank you for coming in today.   You should hear from MRI scheduling within 1 week. If you do not hear please let me know.    Recheck following the MRI.

## 2022-06-01 NOTE — Progress Notes (Signed)
Benjamin Davila is a 82 y.o. male who presents to [place] at [facility] today for left lateral hip pain.  Benjamin Davila has been seen several times for left lateral hip and lateral leg pain.  The pain occurs in the lateral hip extends to the anterior thigh and down the leg to the left lateral calf.  He has been treated most recently for hip tendinitis and bursitis with physical therapy and shockwave treatments.  This has not helped very much at all and is still having severe pain now with pain more radiating down the leg.  Prior to make sure he was seen Dr. Gean Davila at [facility] neurosurgery rehad nerve root block and epidural steroid injection at L2 and L4 with little benefit.  Additionally he has had a greater trochanter bursa injection and intra-articular left hip injection with  MRI for the left hip in May 2023 did not show much significant tendonitis.    Pertinent review of systems: No fevers or chills  Relevant historical information: History of prostate cancer   Exam:  Wt 186 lb (84.4 kg)   BMI 26.69 kg/m  General: Well Developed, well nourished, and in no acute distress.   MSK: Left hip: Tender palpation.  Decreased strength abduction.  Antalgic gait.    Lab and Radiology Results No results found for this or any previous visit (from the past 72 hour(s)). No results found.     Assessment and Plan: 82 y.o. male with left lateral hip pain.  Etiology remains unclear. He has significant pain at the lateral hip with pain radiating into the anterior thigh and down the leg more distally into the lateral calf.  I do think he has some pain due to hip abductor weakness which is probably causing some tendinopathy but he is not improving with typical treatment for this and has not improved in the past with other treatment for this such as injections.  MRI in May did not show an obvious or clear cause of his pain related to tendinopathy or bursitis.  He does have a  CT myelogram from July that shows the potential for L5 nerve root impingement which could explain a fair amount of his pain.  He did not improve with nerve root block at L2 and 4 previously but I wonder if he would get benefit from nerve root block or epidural steroid injection at L5  Given he has a prostate cancer history I want to be very clear that he does not have any hip explanation for his pain.  Will repeat hip MRI as it has been greater than 6 months since his last imaging test there.  Assuming this is normal we will proceed with discussion with Dr. Davy Davila regarding potential for L5 treatment.  Additionally we may consider ABI or even three-phase bone scan as other future diagnostic next steps.   PDMP not reviewed this encounter. Orders Placed This Encounter  Procedures   MR HIP LEFT WO CONTRAST    Standing Status:   Future    Standing Expiration Date:   06/02/2023    Order Specific Question:   What is the patient's sedation requirement?    Answer:   No Sedation    Order Specific Question:   Does the patient have a pacemaker or implanted devices?    Answer:   No    Order Specific Question:   Preferred imaging location?    Answer:   [facility] (table limit-550lbs)   No orders of  the defined types were placed in this encounter.    Discussed warning signs or symptoms. Please see discharge instructions. Patient expresses understanding.   The above documentation has been reviewed and is accurate and complete Benjamin Davila, M.D.

## 2022-06-04 ENCOUNTER — Encounter: Payer: Self-pay | Admitting: Physical Therapy

## 2022-06-04 ENCOUNTER — Ambulatory Visit (INDEPENDENT_AMBULATORY_CARE_PROVIDER_SITE_OTHER): Payer: Medicare Other | Admitting: Physical Therapy

## 2022-06-04 DIAGNOSIS — M25552 Pain in left hip: Secondary | ICD-10-CM

## 2022-06-04 DIAGNOSIS — M6281 Muscle weakness (generalized): Secondary | ICD-10-CM | POA: Diagnosis not present

## 2022-06-04 DIAGNOSIS — M5459 Other low back pain: Secondary | ICD-10-CM

## 2022-06-04 NOTE — Therapy (Signed)
OUTPATIENT PHYSICAL THERAPY Treatment    Patient Name: Benjamin Davila MRN: [id number] DOB:[birth year], 82 y.o., male Today's Date: 06/04/2022   PT End of Session - 06/04/22 1158     Visit Number 6    Number of Visits 16    Date for PT Re-Evaluation 07/13/22    Authorization Type BCBS Medicare    PT Start Time 6295    PT Stop Time 2841    PT Time Calculation (min) 40 min    Activity Tolerance Patient tolerated treatment well    Behavior During Therapy [facility] for tasks assessed/performed                 Past Medical History:  Diagnosis Date   Aortic regurgitation 07/14/2020   Aortic stenosis, moderate 03/17/2019   Autoimmune hepatitis ([place]) 07/12/2020   Benign prostatic hyperplasia without lower urinary tract symptoms 07/12/2020   Cardiac murmur 12/05/2018   Diabetes mellitus due to underlying condition with unspecified complications ([place]) 32/44/0102   Essential hypertension 12/05/2018   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   History of pancreatitis 07/12/2020   History of pancytopenia 07/12/2020   Hyperlipidemia, unspecified 07/12/2020   Irregular heart beat 07/12/2020   Long term (current) use of insulin ([place]) 07/12/2020   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   Lumbar radiculopathy 06/17/2020   Lumbar spondylosis 07/28/2020   Malignant neoplasm of prostate ([place]) 06/24/2019   Mood disorder ([place]) 07/12/2020   Pain of left hip joint 08/25/2019   PONV (postoperative nausea and vomiting)    Prostate cancer ([place])    Spinal stenosis of lumbar region 72/53/6644   Systolic murmur 03/47/4259   Trochanteric bursitis of left hip 03/12/2021   Trochanteric bursitis of right hip 03/12/2021   Type 2 diabetes mellitus with other specified complication ([place]) 56/38/7564   Past Surgical History:  Procedure Laterality Date   BACK SURGERY     lumbar surgery 20 years ago per patient   CHOLECYSTECTOMY     20 years ago per patient   LUMBAR LAMINECTOMY/DECOMPRESSION MICRODISCECTOMY  Bilateral 08/09/2021   Procedure: Lumbar decompressive laminectomy  - Lumbar two-Lumabr three - Lumbar three-Lumbar four - Lumbar four-Lumbar five - Lumabr five-Sacal one - Bilateral, Posterior Lateral Fusion Lumbar three-four-Right;  Surgeon: Eustace Moore, MD;  Location: [facility];  Service: Neurosurgery;  Laterality: Bilateral;   TONSILLECTOMY     as a kid   Patient Active Problem List   Diagnosis Date Noted   S/P lumbar laminectomy 08/09/2021   Trochanteric bursitis of left hip 03/12/2021   Trochanteric bursitis of right hip 03/12/2021   Lumbar spondylosis 07/28/2020   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   Aortic regurgitation 07/14/2020   Autoimmune hepatitis ([place]) 07/12/2020   Benign prostatic hyperplasia without lower urinary tract symptoms 07/12/2020   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   History of pancreatitis 07/12/2020   History of pancytopenia 07/12/2020   Hyperlipidemia, unspecified 07/12/2020   Irregular heart beat 07/12/2020   Long term (current) use of insulin ([place]) 07/12/2020   Mood disorder ([place]) 33/29/5188   Systolic murmur 41/66/0630   Type 2 diabetes mellitus with other specified complication ([place]) 16/05/930   Prostate cancer ([place])    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   Lumbar radiculopathy 06/17/2020   Pain of left hip joint 08/25/2019   Malignant neoplasm of prostate ([place]) 06/24/2019   Aortic stenosis, moderate 03/17/2019   Cardiac murmur 12/05/2018   Essential hypertension 12/05/2018   Diabetes mellitus due to underlying condition with unspecified complications ([place]) 35/57/3220    PCP: Mitzi Hansen  Dayna Ramus, [place] PROVIDER: [name]   REFERRING DIAG: L hip pain/trochanteric bursitis  Rationale for Evaluation and Treatment Rehabilitation  THERAPY DIAG:  Pain in left hip  Muscle weakness (generalized)  Other low back pain  ONSET DATE: Lumbar Laminectomy & Microdiskectomy on 08/09/21  SUBJECTIVE:                                                                                                                                                                                            SUBJECTIVE STATEMENT: 06/04/22: pt states continued pain, constant, increased with initial standing from chair.   Eval: Pt has had ongoing pain in hip for a few years now. He has had back surgery in March 2023. He continues to have ongoing pain and dysfunction/weakness in L hip. Has had MRI of hip, and has had other/aquatic therapy as well with little relief. He has a 2 wheeled walker at home, today is not using any AD.    PERTINENT HISTORY:  Chronic hip and LBP, previous PT for low back  PAIN:  Are you having pain? Yes: NPRS scale: 6/10 Pain location: L hip, gr troch, into L thigh  Pain description: radiating, sore  Aggravating factors: use of leg, walking,  Relieving factors: none stated    PRECAUTIONS: Fall  WEIGHT BEARING RESTRICTIONS No  FALLS:  Has patient fallen in last 6 months? No  LIVING ENVIRONMENT: Stairs at home- steps to get in from garage with hand rails, stairs in home with bedroom downstairs  OCCUPATION: retired  PLOF: [place]  decreased pain in hip   OBJECTIVE:    SCREENING FOR RED FLAGS: No findings  COGNITION:  Overall cognitive status: Within functional limits for tasks assessed     SENSATION: WFL   Palpation:  most pain at gr troch, glute med, piriformis,  states pain can come down into anterior thigh with standing and walking.     LUMBAR ROM:   Active  AROM  eval  Flexion Mild limitation  Extension Slightly past neutral  Right lateral flexion   Left lateral flexion   Right rotation   Left rotation    (Blank rows = not tested)  Hip ROM:   Hip flex: mild limitation, IR/ ER: mild limitation  LOWER EXTREMITY MMT:    Hip strength L R  Flexion 3- 4-  Extension    Abduction 3- 3+                 TODAY'S TREATMENT   06/04/22: Therapeutic Exercise: Aerobic:   Supine:  Bridging x 10; SLR 2 x 5 on L;  Seated:   LAQ no weight x 10 ea,  3lb x 10 ea;  S/L : hip abd 2 x 10 ;  S/L clams x 15;  Standing:   Step ups 4 in x 10, 6 in x 10 with 1 UE support;  Stretches:    Seated HSS 30 sec x 3 bil;  Neuromuscular Re-education: Manual Therapy: long leg distraction bil x 2 min;     05/31/22: Therapeutic Exercise: Aerobic:  Supine:  Bridging x 10;     Clams Blue TB x 20;    Seated:  Sit to stand x 10, no UE support, higher mat table.  S/L : hip abd 2 x 10 (assist 5-10),  S/L clams x 15;  Standing:  Marching x 20; gait with RW, 45 ft x 4- cueing for upright posture, and sequencing/safety with changing directions.  Stretches:  Supine piriformis stretch (modified) x 2 bil; ER clams ROM x 15; SKTC 30 sec x 1 bil; LTR x 10, 5 sec holds.  Neuromuscular Re-education: Manual Therapy: long leg distraction bil x 2 min; S/L hip flexor/quad stretch;  Self Care: tennis balls given for walker legs.   05/28/22: Therapeutic Exercise: Aerobic:  Supine:  Bridging x 10;   SLR x 10 bil;  Clams Blue TB x 20;    Seated:  Standing:  ;  Step ups 4 in, on L,  1 UE support x 15;  Stretches:  Supine piriformis stretch (modified) x 2 bil; ER clams ROM x 15; SKTC 30 sec x 1 bil;  Neuromuscular Re-education: Manual Therapy:  Self Care:    05/24/21: Therapeutic Exercise: Aerobic: Supine:  SLR x 10 bil; Clams Blue TB x 20; Pelvic tilts x 20; Supine march with TA x 20;  S/L hip abd 2x 5 on L,  x 10 on R;  Clams x 15 bil;   Seated: Sit to stand x 10  Standing:  Toe taps on 4 in step x 10;  Step ups 4 in, on L,  2 UE support x 10;  Stretches:  Trial for hip flexor stretch, supine off side of table ( increased pain in Lateral hip )  Neuromuscular Re-education: Manual Therapy: STM/roller to quad/thigh, ITB and glute med, Long leg distraction for L hip,  post mobs for L hip.  Self Care:    PATIENT EDUCATION:  Education details: updated and reviewed HEP, ionto use, walker  height, and use of tennis balls for walker.  Person educated: Patient and Spouse Education method: Explanation, Demonstration, Tactile cues, Verbal cues, and Handouts Education comprehension: verbalized understanding, returned demonstration, verbal cues required, tactile cues required, and needs further education   HOME EXERCISE PROGRAM: [email or web address]     old  Access Code: [email or web address]   new    ASSESSMENT:  CLINICAL IMPRESSION:  06/04/21  Pt with improving gait mechanics, using RW. He is able to ambulate without, but continue to recommend use of walker, for much improved mechanics and posture when he is using.  He requires max cuing for performing ther ex with optimal form. Plan to continue strength for quad and hip, and try more manual next visit for lateral hip.   Eval: Patient  presents with primary complaint of ongoing, significant pain in L hip. He has had hip deficits for 2 years now. Today, he has severe weakness of L hip, that seems to be main limiting factor. He has poor gait mechanics, poor ability for stairs and functional activity due to weakness and instability and pain. He  is not currently doing focused strengthening for his current deficits. He will benefit from skilled PT for focused attention to hip strength, function, gait, and hip pain relief.    OBJECTIVE IMPAIRMENTS Abnormal gait, decreased activity tolerance, decreased balance, decreased mobility, difficulty walking, decreased ROM, decreased strength, increased muscle spasms, impaired flexibility, improper body mechanics, postural dysfunction, and pain.   ACTIVITY LIMITATIONS carrying, lifting, bending, sitting, standing, stairs, transfers, and locomotion level  PARTICIPATION LIMITATIONS: meal prep, cleaning, laundry, shopping, community activity, and yard work  PERSONAL FACTORS Past/current experiences, Time since onset of injury/illness/exacerbation, and 1-2 comorbidities: trochanteric bursitis, progression of ambulation  limitations  are also affecting patient's functional outcome.   REHAB POTENTIAL: Good  CLINICAL DECISION MAKING: Evolving/moderate complexity  EVALUATION COMPLEXITY: Moderate   GOALS: Goals reviewed with patient? Yes  SHORT TERM GOALS: Target date: 06/01/2022   Pt to be independent with initial HEP Goal status: INITIAL   LONG TERM GOALS: Target date: 07/13/2022   Pt to be independent with final HEP Goal status: INITIAL   2.  Pt to demo improved strength of L hip to at least 4/5 to improve stability, gait pattern, and pain.   Goal status: INITIAL  3.  Pt to demo optimal gait mechanics with use of LRAD, to improve function and community navigation.   Goal status: INITIAL  4.  Able to safely navigate stairs, step-over-step and single hand rail use   Goal status: INITIAL    PLAN: PT FREQUENCY: 1-2x/week  PT DURATION: 8 weeks  PLANNED INTERVENTIONS: Therapeutic exercises, Therapeutic activity, Neuromuscular re-education, Balance training, Gait training, Patient/Family education, Self Care, Joint mobilization, Joint manipulation, Stair training, Aquatic Therapy, Dry Needling, Electrical stimulation, Spinal manipulation, Spinal mobilization, Cryotherapy, Moist heat, Taping, Traction, Ultrasound, Ionotophoresis '4mg'$ /ml Dexamethasone, Manual therapy, and Re-evaluation.  PLAN FOR NEXT SESSION:    Lyndee Hensen, PT, DPT 12:02 PM  06/04/22

## 2022-06-05 ENCOUNTER — Telehealth: Payer: Self-pay | Admitting: Family Medicine

## 2022-06-05 NOTE — Telephone Encounter (Signed)
Your insurance wants you to have 6 weeks of PT prior to another MRI.  Lets complete the PT and if not better then re-order the MRI.

## 2022-06-08 ENCOUNTER — Encounter: Payer: Self-pay | Admitting: Physical Therapy

## 2022-06-08 ENCOUNTER — Ambulatory Visit (INDEPENDENT_AMBULATORY_CARE_PROVIDER_SITE_OTHER): Payer: Medicare Other | Admitting: Physical Therapy

## 2022-06-08 DIAGNOSIS — M6281 Muscle weakness (generalized): Secondary | ICD-10-CM

## 2022-06-08 DIAGNOSIS — M25552 Pain in left hip: Secondary | ICD-10-CM

## 2022-06-08 DIAGNOSIS — M5459 Other low back pain: Secondary | ICD-10-CM

## 2022-06-08 NOTE — Therapy (Signed)
OUTPATIENT PHYSICAL THERAPY Treatment    Patient Name: Benjamin Davila MRN: [id number] DOB:1941/04/22, 82 y.o., male Today's Date: 06/08/2022   PT End of Session - 06/08/22 1110     Visit Number 7    Number of Visits 16    Date for PT Re-Evaluation 07/13/22    Authorization Type BCBS Medicare    PT Start Time 1017    PT Stop Time 1112    PT Time Calculation (min) 55 min    Activity Tolerance Patient tolerated treatment well    Behavior During Therapy [facility] for tasks assessed/performed                  Past Medical History:  Diagnosis Date   Aortic regurgitation 07/14/2020   Aortic stenosis, moderate 03/17/2019   Autoimmune hepatitis ([place]) 07/12/2020   Benign prostatic hyperplasia without lower urinary tract symptoms 07/12/2020   Cardiac murmur 12/05/2018   Diabetes mellitus due to underlying condition with unspecified complications ([place]) 67/05/4101   Essential hypertension 12/05/2018   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   History of pancreatitis 07/12/2020   History of pancytopenia 07/12/2020   Hyperlipidemia, unspecified 07/12/2020   Irregular heart beat 07/12/2020   Long term (current) use of insulin ([place]) 07/12/2020   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   Lumbar radiculopathy 06/17/2020   Lumbar spondylosis 07/28/2020   Malignant neoplasm of prostate ([place]) 06/24/2019   Mood disorder ([place]) 07/12/2020   Pain of left hip joint 08/25/2019   PONV (postoperative nausea and vomiting)    Prostate cancer ([place])    Spinal stenosis of lumbar region 06/20/4386   Systolic murmur 87/57/9728   Trochanteric bursitis of left hip 03/12/2021   Trochanteric bursitis of right hip 03/12/2021   Type 2 diabetes mellitus with other specified complication ([place]) 20/60/1561   Past Surgical History:  Procedure Laterality Date   BACK SURGERY     lumbar surgery 20 years ago per patient   CHOLECYSTECTOMY     20 years ago per patient   LUMBAR LAMINECTOMY/DECOMPRESSION MICRODISCECTOMY  Bilateral 08/09/2021   Procedure: Lumbar decompressive laminectomy  - Lumbar two-Lumabr three - Lumbar three-Lumbar four - Lumbar four-Lumbar five - Lumabr five-Sacal one - Bilateral, Posterior Lateral Fusion Lumbar three-four-Right;  Surgeon: Eustace Moore, MD;  Location: [place];  Service: Neurosurgery;  Laterality: Bilateral;   TONSILLECTOMY     as a kid   Patient Active Problem List   Diagnosis Date Noted   S/P lumbar laminectomy 08/09/2021   Trochanteric bursitis of left hip 03/12/2021   Trochanteric bursitis of right hip 03/12/2021   Lumbar spondylosis 07/28/2020   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   Aortic regurgitation 07/14/2020   Autoimmune hepatitis ([place]) 07/12/2020   Benign prostatic hyperplasia without lower urinary tract symptoms 07/12/2020   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   History of pancreatitis 07/12/2020   History of pancytopenia 07/12/2020   Hyperlipidemia, unspecified 07/12/2020   Irregular heart beat 07/12/2020   Long term (current) use of insulin ([place]) 07/12/2020   Mood disorder ([place]) 53/79/4327   Systolic murmur 61/47/0929   Type 2 diabetes mellitus with other specified complication ([place]) 57/47/3403   Prostate cancer ([place])    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   Lumbar radiculopathy 06/17/2020   Pain of left hip joint 08/25/2019   Malignant neoplasm of prostate ([place]) 06/24/2019   Aortic stenosis, moderate 03/17/2019   Cardiac murmur 12/05/2018   Essential hypertension 12/05/2018   Diabetes mellitus due to underlying condition with unspecified complications ([place]) 70/96/4383    PCP:  Jillyn Ledger, FNP  REFERRING PROVIDER: [name]   REFERRING DIAG: L hip pain/trochanteric bursitis  Rationale for Evaluation and Treatment Rehabilitation  THERAPY DIAG:  Pain in left hip  Muscle weakness (generalized)  Other low back pain  ONSET DATE: Lumbar Laminectomy & Microdiskectomy on 08/09/21  SUBJECTIVE:                                                                                                                                                                                            SUBJECTIVE STATEMENT: 06/08/22: pt states significant pain in the last few days, very painful even with walking with walker.   Eval: Pt has had ongoing pain in hip for a few years now. He has had back surgery in March 2023. He continues to have ongoing pain and dysfunction/weakness in L hip. Has had MRI of hip, and has had other/aquatic therapy as well with little relief. He has a 2 wheeled walker at home, today is not using any AD.    PERTINENT HISTORY:  Chronic hip and LBP, previous PT for low back  PAIN:  Are you having pain? Yes: NPRS scale: 6/10 Pain location: L hip, gr troch, into L thigh  Pain description: radiating, sore  Aggravating factors: use of leg, walking,  Relieving factors: none stated    PRECAUTIONS: Fall  WEIGHT BEARING RESTRICTIONS No  FALLS:  Has patient fallen in last 6 months? No  LIVING ENVIRONMENT: Stairs at home- steps to get in from garage with hand rails, stairs in home with bedroom downstairs  OCCUPATION: retired  PLOF: [place]  decreased pain in hip   OBJECTIVE:    SCREENING FOR RED FLAGS: No findings  COGNITION:  Overall cognitive status: Within functional limits for tasks assessed     SENSATION: WFL   Palpation:  most pain at gr troch, glute med, piriformis,  states pain can come down into anterior thigh with standing and walking.     LUMBAR ROM:   Active  AROM  eval  Flexion Mild limitation  Extension Slightly past neutral  Right lateral flexion   Left lateral flexion   Right rotation   Left rotation    (Blank rows = not tested)  Hip ROM:   Hip flex: mild limitation, IR/ ER: mild limitation  LOWER EXTREMITY MMT:    Hip strength L R  Flexion 3- 4-  Extension    Abduction 3- 3+                 TODAY'S TREATMENT   06/08/22: Therapeutic  Exercise: Aerobic:  Supine:      Prone: hip  ext x 10 bil;  Seated:     S/L : hip abd- unable/ pain Standing:    Stretches:   pelvic tilts x 20; LTR x 15;  Neuromuscular Re-education: Manual Therapy: long leg distraction bil x 3 min on L; manual hip stretching for flex, IR, ER; Prone quad stretching and PROM for hip IR/ER; STM and IASTM to gr troch and L glute.  Modalities: Hot pack x 12 min after treatment (prior to ionto patch) Ionto, 4 hr patch with dexamethasone to L gr troch.    06/04/22: Therapeutic Exercise: Aerobic:  Supine:  Bridging x 10; SLR 2 x 5 on L;      Seated:   LAQ no weight x 10 ea,  3lb x 10 ea;  S/L : hip abd 2 x 10 ;  S/L clams x 15;  Standing:   Step ups 4 in x 10, 6 in x 10 with 1 UE support;  Stretches:    Seated HSS 30 sec x 3 bil;  Neuromuscular Re-education: Manual Therapy: long leg distraction bil x 2 min;     05/31/22: Therapeutic Exercise: Aerobic:  Supine:  Bridging x 10;     Clams Blue TB x 20;    Seated:  Sit to stand x 10, no UE support, higher mat table.  S/L : hip abd 2 x 10 (assist 5-10),  S/L clams x 15;  Standing:  Marching x 20; gait with RW, 45 ft x 4- cueing for upright posture, and sequencing/safety with changing directions.  Stretches:  Supine piriformis stretch (modified) x 2 bil; ER clams ROM x 15; SKTC 30 sec x 1 bil; LTR x 10, 5 sec holds.  Neuromuscular Re-education: Manual Therapy: long leg distraction bil x 2 min; S/L hip flexor/quad stretch;  Self Care: tennis balls given for walker legs.   05/28/22: Therapeutic Exercise: Aerobic:  Supine:  Bridging x 10;   SLR x 10 bil;  Clams Blue TB x 20;    Seated:  Standing:  ;  Step ups 4 in, on L,  1 UE support x 15;  Stretches:  Supine piriformis stretch (modified) x 2 bil; ER clams ROM x 15; SKTC 30 sec x 1 bil;  Neuromuscular Re-education: Manual Therapy:  Self Care:    05/24/21: Therapeutic Exercise: Aerobic: Supine:  SLR x 10 bil; Clams Blue TB x 20; Pelvic tilts x 20;  Supine march with TA x 20;  S/L hip abd 2x 5 on L,  x 10 on R;  Clams x 15 bil;   Seated: Sit to stand x 10  Standing:  Toe taps on 4 in step x 10;  Step ups 4 in, on L,  2 UE support x 10;  Stretches:  Trial for hip flexor stretch, supine off side of table ( increased pain in Lateral hip )  Neuromuscular Re-education: Manual Therapy: STM/roller to quad/thigh, ITB and glute med, Long leg distraction for L hip,  post mobs for L hip.  Self Care:    PATIENT EDUCATION:  Education details: updated and reviewed HEP, ionto use, walker height, and use of tennis balls for walker.  Person educated: Patient and Spouse Education method: Explanation, Demonstration, Tactile cues, Verbal cues, and Handouts Education comprehension: verbalized understanding, returned demonstration, verbal cues required, tactile cues required, and needs further education   HOME EXERCISE PROGRAM: [email or web address]     old  Access Code: [email or web address]   new    ASSESSMENT:  CLINICAL IMPRESSION:  06/08/21  Pt with more pain  today, that is limiting for activity. Unable to do s/l hip abd due to pain. He has most pain at gr troch into L thigh. Most pain with standing/loading. Discussed continuing back mobility as well as hip strength as able at home, for HEP. Plan to progress as tolerated. Good tolerance for manual today.   Eval: Patient  presents with primary complaint of ongoing, significant pain in L hip. He has had hip deficits for 2 years now. Today, he has severe weakness of L hip, that seems to be main limiting factor. He has poor gait mechanics, poor ability for stairs and functional activity due to weakness and instability and pain. He is not currently doing focused strengthening for his current deficits. He will benefit from skilled PT for focused attention to hip strength, function, gait, and hip pain relief.    OBJECTIVE IMPAIRMENTS Abnormal gait, decreased activity tolerance, decreased balance, decreased mobility, difficulty  walking, decreased ROM, decreased strength, increased muscle spasms, impaired flexibility, improper body mechanics, postural dysfunction, and pain.   ACTIVITY LIMITATIONS carrying, lifting, bending, sitting, standing, stairs, transfers, and locomotion level  PARTICIPATION LIMITATIONS: meal prep, cleaning, laundry, shopping, community activity, and yard work  PERSONAL FACTORS Past/current experiences, Time since onset of injury/illness/exacerbation, and 1-2 comorbidities: trochanteric bursitis, progression of ambulation limitations  are also affecting patient's functional outcome.   REHAB POTENTIAL: Good  CLINICAL DECISION MAKING: Evolving/moderate complexity  EVALUATION COMPLEXITY: Moderate   GOALS: Goals reviewed with patient? Yes  SHORT TERM GOALS: Target date: 06/01/2022   Pt to be independent with initial HEP Goal status: INITIAL   LONG TERM GOALS: Target date: 07/13/2022   Pt to be independent with final HEP Goal status: INITIAL   2.  Pt to demo improved strength of L hip to at least 4/5 to improve stability, gait pattern, and pain.   Goal status: INITIAL  3.  Pt to demo optimal gait mechanics with use of LRAD, to improve function and community navigation.   Goal status: INITIAL  4.  Able to safely navigate stairs, step-over-step and single hand rail use   Goal status: INITIAL    PLAN: PT FREQUENCY: 1-2x/week  PT DURATION: 8 weeks  PLANNED INTERVENTIONS: Therapeutic exercises, Therapeutic activity, Neuromuscular re-education, Balance training, Gait training, Patient/Family education, Self Care, Joint mobilization, Joint manipulation, Stair training, Aquatic Therapy, Dry Needling, Electrical stimulation, Spinal manipulation, Spinal mobilization, Cryotherapy, Moist heat, Taping, Traction, Ultrasound, Ionotophoresis '4mg'$ /ml Dexamethasone, Manual therapy, and Re-evaluation.  PLAN FOR NEXT SESSION:    Lyndee Hensen, PT, DPT 11:21 AM  06/08/22

## 2022-06-08 NOTE — Telephone Encounter (Signed)
I called and discussed the MRI and PT plan

## 2022-06-11 ENCOUNTER — Encounter: Payer: Self-pay | Admitting: Physical Therapy

## 2022-06-11 ENCOUNTER — Ambulatory Visit (INDEPENDENT_AMBULATORY_CARE_PROVIDER_SITE_OTHER): Payer: Medicare Other | Admitting: Physical Therapy

## 2022-06-11 ENCOUNTER — Telehealth: Payer: Self-pay | Admitting: Family Medicine

## 2022-06-11 DIAGNOSIS — M5459 Other low back pain: Secondary | ICD-10-CM

## 2022-06-11 DIAGNOSIS — R262 Difficulty in walking, not elsewhere classified: Secondary | ICD-10-CM | POA: Diagnosis not present

## 2022-06-11 DIAGNOSIS — M6281 Muscle weakness (generalized): Secondary | ICD-10-CM

## 2022-06-11 DIAGNOSIS — M25552 Pain in left hip: Secondary | ICD-10-CM

## 2022-06-11 NOTE — Therapy (Signed)
OUTPATIENT PHYSICAL THERAPY Treatment    Patient Name: Benjamin Davila MRN: [id number] DOB:07-19-1940, 82 y.o., male Today's Date: 06/11/2022   PT End of Session - 06/11/22 1030     Visit Number 8    Number of Visits 16    Date for PT Re-Evaluation 07/13/22    Authorization Type BCBS Medicare    PT Start Time 1023    PT Stop Time 1110    PT Time Calculation (min) 47 min    Activity Tolerance Patient tolerated treatment well    Behavior During Therapy [facility] for tasks assessed/performed                   Past Medical History:  Diagnosis Date   Aortic regurgitation 07/14/2020   Aortic stenosis, moderate 03/17/2019   Autoimmune hepatitis ([place]) 07/12/2020   Benign prostatic hyperplasia without lower urinary tract symptoms 07/12/2020   Cardiac murmur 12/05/2018   Diabetes mellitus due to underlying condition with unspecified complications ([place]) 27/78/2423   Essential hypertension 12/05/2018   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   History of pancreatitis 07/12/2020   History of pancytopenia 07/12/2020   Hyperlipidemia, unspecified 07/12/2020   Irregular heart beat 07/12/2020   Long term (current) use of insulin ([place]) 07/12/2020   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   Lumbar radiculopathy 06/17/2020   Lumbar spondylosis 07/28/2020   Malignant neoplasm of prostate ([place]) 06/24/2019   Mood disorder ([place]) 07/12/2020   Pain of left hip joint 08/25/2019   PONV (postoperative nausea and vomiting)    Prostate cancer ([place])    Spinal stenosis of lumbar region 53/61/4431   Systolic murmur 54/00/8676   Trochanteric bursitis of left hip 03/12/2021   Trochanteric bursitis of right hip 03/12/2021   Type 2 diabetes mellitus with other specified complication ([place]) 19/50/9326   Past Surgical History:  Procedure Laterality Date   BACK SURGERY     lumbar surgery 20 years ago per patient   CHOLECYSTECTOMY     20 years ago per patient   LUMBAR LAMINECTOMY/DECOMPRESSION  MICRODISCECTOMY Bilateral 08/09/2021   Procedure: Lumbar decompressive laminectomy  - Lumbar two-Lumabr three - Lumbar three-Lumbar four - Lumbar four-Lumbar five - Lumabr five-Sacal one - Bilateral, Posterior Lateral Fusion Lumbar three-four-Right;  Surgeon: Eustace Moore, MD;  Location: [place];  Service: Neurosurgery;  Laterality: Bilateral;   TONSILLECTOMY     as a kid   Patient Active Problem List   Diagnosis Date Noted   S/P lumbar laminectomy 08/09/2021   Trochanteric bursitis of left hip 03/12/2021   Trochanteric bursitis of right hip 03/12/2021   Lumbar spondylosis 07/28/2020   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   Aortic regurgitation 07/14/2020   Autoimmune hepatitis ([place]) 07/12/2020   Benign prostatic hyperplasia without lower urinary tract symptoms 07/12/2020   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   History of pancreatitis 07/12/2020   History of pancytopenia 07/12/2020   Hyperlipidemia, unspecified 07/12/2020   Irregular heart beat 07/12/2020   Long term (current) use of insulin ([place]) 07/12/2020   Mood disorder ([place]) 71/24/5809   Systolic murmur 98/33/8250   Type 2 diabetes mellitus with other specified complication ([place]) 53/97/6734   Prostate cancer ([place])    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   Lumbar radiculopathy 06/17/2020   Pain of left hip joint 08/25/2019   Malignant neoplasm of prostate ([place]) 06/24/2019   Aortic stenosis, moderate 03/17/2019   Cardiac murmur 12/05/2018   Essential hypertension 12/05/2018   Diabetes mellitus due to underlying condition with unspecified complications ([place]) 19/37/9024  PCP: Jillyn Ledger, FNP  REFERRING PROVIDER: [name]   REFERRING DIAG: L hip pain/trochanteric bursitis  Rationale for Evaluation and Treatment Rehabilitation  THERAPY DIAG:  Pain in left hip  Muscle weakness (generalized)  Other low back pain  Difficulty in walking, not elsewhere classified  ONSET DATE: Lumbar Laminectomy &  Microdiskectomy on 08/09/21  SUBJECTIVE:                                                                                                                                                                                           SUBJECTIVE STATEMENT: 06/11/22: pt states continued, constant pain, and more difficulty sleeping at night in the last few nights.   Eval: Pt has had ongoing pain in hip for a few years now. He has had back surgery in March 2023. He continues to have ongoing pain and dysfunction/weakness in L hip. Has had MRI of hip, and has had other/aquatic therapy as well with little relief. He has a 2 wheeled walker at home, today is not using any AD.    PERTINENT HISTORY:  Chronic hip and LBP, previous PT for low back  PAIN:  Are you having pain? Yes: NPRS scale: 6/10 Pain location: L hip, gr troch, into L thigh  Pain description: radiating, sore  Aggravating factors: use of leg, walking,  Relieving factors: none stated    PRECAUTIONS: Fall  WEIGHT BEARING RESTRICTIONS No  FALLS:  Has patient fallen in last 6 months? No  LIVING ENVIRONMENT: Stairs at home- steps to get in from garage with hand rails, stairs in home with bedroom downstairs  OCCUPATION: retired  PLOF: [place]  decreased pain in hip   OBJECTIVE:    SCREENING FOR RED FLAGS: No findings  COGNITION:  Overall cognitive status: Within functional limits for tasks assessed     SENSATION: WFL   Palpation:  most pain at gr troch, glute med, piriformis,  states pain can come down into anterior thigh with standing and walking.     LUMBAR ROM:   Active  AROM  eval  Flexion Mild limitation  Extension Slightly past neutral  Right lateral flexion   Left lateral flexion   Right rotation   Left rotation    (Blank rows = not tested)  Hip ROM:   Hip flex: mild limitation, IR/ ER: mild limitation  LOWER EXTREMITY MMT:    Hip strength L R  Flexion 3- 4-  Extension     Abduction 3- 3+                 TODAY'S TREATMENT  06/11/22: Therapeutic Exercise: Aerobic:  Supine:  Supine march x 20 with TA; ; SLR  2 x 10 on L;  Bridging 2 x 10;  Prone: hip ext x 10 bil;  Seated:     S/L : clams 2 x 10;  hip abd- unable/ pain Standing:   Hip abd and extension 2 x 10 on L;  Stretches:  DKTC 30 sec x 3  LTR x 15;  Seated HSS 30 sec x 3;  Neuromuscular Re-education: Manual Therapy: long leg distraction bil x 3 min on L; manual hip stretching for flex, IR, ER;  Modalities:    06/08/22: Therapeutic Exercise: Aerobic:  Supine:      Prone: hip ext x 10 bil;  Seated:     S/L : hip abd- unable/ pain Standing:    Stretches:   pelvic tilts x 20; LTR x 15;  Neuromuscular Re-education: Manual Therapy: long leg distraction bil x 3 min on L; manual hip stretching for flex, IR, ER; Prone quad stretching and PROM for hip IR/ER; STM and IASTM to gr troch and L glute.  Modalities: Hot pack x 12 min after treatment (prior to ionto patch) Ionto, 4 hr patch with dexamethasone to L gr troch.    06/04/22: Therapeutic Exercise: Aerobic:  Supine:  Bridging x 10; SLR 2 x 5 on L;      Seated:   LAQ no weight x 10 ea,  3lb x 10 ea;  S/L : hip abd 2 x 10 ;  S/L clams x 15;  Standing:   Step ups 4 in x 10, 6 in x 10 with 1 UE support;  Stretches:    Seated HSS 30 sec x 3 bil;  Neuromuscular Re-education: Manual Therapy: long leg distraction bil x 2 min;     05/31/22: Therapeutic Exercise: Aerobic:  Supine:  Bridging x 10;     Clams Blue TB x 20;    Seated:  Sit to stand x 10, no UE support, higher mat table.  S/L : hip abd 2 x 10 (assist 5-10),  S/L clams x 15;  Standing:  Marching x 20; gait with RW, 45 ft x 4- cueing for upright posture, and sequencing/safety with changing directions.  Stretches:  Supine piriformis stretch (modified) x 2 bil; ER clams ROM x 15; SKTC 30 sec x 1 bil; LTR x 10, 5 sec holds.  Neuromuscular Re-education: Manual Therapy: long leg  distraction bil x 2 min; S/L hip flexor/quad stretch;  Self Care: tennis balls given for walker legs.   05/28/22: Therapeutic Exercise: Aerobic:  Supine:  Bridging x 10;   SLR x 10 bil;  Clams Blue TB x 20;    Seated:  Standing:  ;  Step ups 4 in, on L,  1 UE support x 15;  Stretches:  Supine piriformis stretch (modified) x 2 bil; ER clams ROM x 15; SKTC 30 sec x 1 bil;  Neuromuscular Re-education: Manual Therapy:  Self Care:       PATIENT EDUCATION:  Education details: updated and reviewed HEP, ionto use, walker height, and use of tennis balls for walker.  Person educated: Patient and Spouse Education method: Explanation, Demonstration, Tactile cues, Verbal cues, and Handouts Education comprehension: verbalized understanding, returned demonstration, verbal cues required, tactile cues required, and needs further education   HOME EXERCISE PROGRAM: [email or web address]     old  Access Code: [email or web address]   new    ASSESSMENT:  CLINICAL IMPRESSION:  06/11/22:  Pt with good tolerance for activities today. Discussed continued mobility for back, as  well as hip strengthening. Able to do hip abd and ext better in standing position today. Still very challenged in s/l position due to weakness and pain.   Eval: Patient  presents with primary complaint of ongoing, significant pain in L hip. He has had hip deficits for 2 years now. Today, he has severe weakness of L hip, that seems to be main limiting factor. He has poor gait mechanics, poor ability for stairs and functional activity due to weakness and instability and pain. He is not currently doing focused strengthening for his current deficits. He will benefit from skilled PT for focused attention to hip strength, function, gait, and hip pain relief.    OBJECTIVE IMPAIRMENTS Abnormal gait, decreased activity tolerance, decreased balance, decreased mobility, difficulty walking, decreased ROM, decreased strength, increased muscle spasms, impaired flexibility,  improper body mechanics, postural dysfunction, and pain.   ACTIVITY LIMITATIONS carrying, lifting, bending, sitting, standing, stairs, transfers, and locomotion level  PARTICIPATION LIMITATIONS: meal prep, cleaning, laundry, shopping, community activity, and yard work  PERSONAL FACTORS Past/current experiences, Time since onset of injury/illness/exacerbation, and 1-2 comorbidities: trochanteric bursitis, progression of ambulation limitations  are also affecting patient's functional outcome.   REHAB POTENTIAL: Good  CLINICAL DECISION MAKING: Evolving/moderate complexity  EVALUATION COMPLEXITY: Moderate   GOALS: Goals reviewed with patient? Yes  SHORT TERM GOALS: Target date: 06/01/2022   Pt to be independent with initial HEP Goal status: INITIAL   LONG TERM GOALS: Target date: 07/13/2022   Pt to be independent with final HEP Goal status: INITIAL   2.  Pt to demo improved strength of L hip to at least 4/5 to improve stability, gait pattern, and pain.   Goal status: INITIAL  3.  Pt to demo optimal gait mechanics with use of LRAD, to improve function and community navigation.   Goal status: INITIAL  4.  Able to safely navigate stairs, step-over-step and single hand rail use   Goal status: INITIAL    PLAN: PT FREQUENCY: 1-2x/week  PT DURATION: 8 weeks  PLANNED INTERVENTIONS: Therapeutic exercises, Therapeutic activity, Neuromuscular re-education, Balance training, Gait training, Patient/Family education, Self Care, Joint mobilization, Joint manipulation, Stair training, Aquatic Therapy, Dry Needling, Electrical stimulation, Spinal manipulation, Spinal mobilization, Cryotherapy, Moist heat, Taping, Traction, Ultrasound, Ionotophoresis '4mg'$ /ml Dexamethasone, Manual therapy, and Re-evaluation.  PLAN FOR NEXT SESSION:    Lyndee Hensen, PT, DPT 11:13 AM  06/11/22

## 2022-06-11 NOTE — Telephone Encounter (Signed)
[facility] called, MRI L hip denied as not medically necessary after peer review. Will fax details  If questions, call [phone], ref # [id number]

## 2022-06-11 NOTE — Telephone Encounter (Signed)
Noted.  That is what I told the patient to expect and we can try  after he completes a course of physical therapy to get it approved.

## 2022-06-15 ENCOUNTER — Ambulatory Visit (INDEPENDENT_AMBULATORY_CARE_PROVIDER_SITE_OTHER): Payer: Medicare Other | Admitting: Physical Therapy

## 2022-06-15 ENCOUNTER — Encounter: Payer: Self-pay | Admitting: Physical Therapy

## 2022-06-15 DIAGNOSIS — M6281 Muscle weakness (generalized): Secondary | ICD-10-CM | POA: Diagnosis not present

## 2022-06-15 DIAGNOSIS — M5459 Other low back pain: Secondary | ICD-10-CM

## 2022-06-15 DIAGNOSIS — M25552 Pain in left hip: Secondary | ICD-10-CM | POA: Diagnosis not present

## 2022-06-15 NOTE — Therapy (Signed)
OUTPATIENT PHYSICAL THERAPY Treatment    Patient Name: Benjamin Davila MRN: [id number] DOB:Jun 29, 1940, 82 y.o., male Today's Date: 06/15/2022   PT End of Session - 06/15/22 1035     Visit Number 9    Number of Visits 16    Date for PT Re-Evaluation 07/13/22    Authorization Type BCBS Medicare    PT Start Time 1023    PT Stop Time 1101    PT Time Calculation (min) 38 min    Activity Tolerance Patient tolerated treatment well    Behavior During Therapy [facility] for tasks assessed/performed                   Past Medical History:  Diagnosis Date   Aortic regurgitation 07/14/2020   Aortic stenosis, moderate 03/17/2019   Autoimmune hepatitis ([place]) 07/12/2020   Benign prostatic hyperplasia without lower urinary tract symptoms 07/12/2020   Cardiac murmur 12/05/2018   Diabetes mellitus due to underlying condition with unspecified complications ([place]) 78/29/5621   Essential hypertension 12/05/2018   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   History of pancreatitis 07/12/2020   History of pancytopenia 07/12/2020   Hyperlipidemia, unspecified 07/12/2020   Irregular heart beat 07/12/2020   Long term (current) use of insulin ([place]) 07/12/2020   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   Lumbar radiculopathy 06/17/2020   Lumbar spondylosis 07/28/2020   Malignant neoplasm of prostate ([place]) 06/24/2019   Mood disorder ([place]) 07/12/2020   Pain of left hip joint 08/25/2019   PONV (postoperative nausea and vomiting)    Prostate cancer ([place])    Spinal stenosis of lumbar region 30/86/5784   Systolic murmur 69/62/9528   Trochanteric bursitis of left hip 03/12/2021   Trochanteric bursitis of right hip 03/12/2021   Type 2 diabetes mellitus with other specified complication ([place]) 41/32/4401   Past Surgical History:  Procedure Laterality Date   BACK SURGERY     lumbar surgery 20 years ago per patient   CHOLECYSTECTOMY     20 years ago per patient   LUMBAR LAMINECTOMY/DECOMPRESSION  MICRODISCECTOMY Bilateral 08/09/2021   Procedure: Lumbar decompressive laminectomy  - Lumbar two-Lumabr three - Lumbar three-Lumbar four - Lumbar four-Lumbar five - Lumabr five-Sacal one - Bilateral, Posterior Lateral Fusion Lumbar three-four-Right;  Surgeon: Eustace Moore, MD;  Location: [place];  Service: Neurosurgery;  Laterality: Bilateral;   TONSILLECTOMY     as a kid   Patient Active Problem List   Diagnosis Date Noted   S/P lumbar laminectomy 08/09/2021   Trochanteric bursitis of left hip 03/12/2021   Trochanteric bursitis of right hip 03/12/2021   Lumbar spondylosis 07/28/2020   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   Aortic regurgitation 07/14/2020   Autoimmune hepatitis ([place]) 07/12/2020   Benign prostatic hyperplasia without lower urinary tract symptoms 07/12/2020   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   History of pancreatitis 07/12/2020   History of pancytopenia 07/12/2020   Hyperlipidemia, unspecified 07/12/2020   Irregular heart beat 07/12/2020   Long term (current) use of insulin ([place]) 07/12/2020   Mood disorder ([place]) 02/72/5366   Systolic murmur 44/07/4740   Type 2 diabetes mellitus with other specified complication ([place]) 59/56/3875   Prostate cancer ([place])    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   Lumbar radiculopathy 06/17/2020   Pain of left hip joint 08/25/2019   Malignant neoplasm of prostate ([place]) 06/24/2019   Aortic stenosis, moderate 03/17/2019   Cardiac murmur 12/05/2018   Essential hypertension 12/05/2018   Diabetes mellitus due to underlying condition with unspecified complications ([place]) 64/33/2951  PCP: Jillyn Ledger, FNP  REFERRING PROVIDER: [name]   REFERRING DIAG: L hip pain/trochanteric bursitis  Rationale for Evaluation and Treatment Rehabilitation  THERAPY DIAG:  Pain in left hip  Muscle weakness (generalized)  Other low back pain  ONSET DATE: Lumbar Laminectomy & Microdiskectomy on 08/09/21  SUBJECTIVE:                                                                                                                                                                                            SUBJECTIVE STATEMENT: 06/15/22: pt states continued, "nothing is helping" . He has a hard time doing exercises some days because its too painful.   Eval: Pt has had ongoing pain in hip for a few years now. He has had back surgery in March 2023. He continues to have ongoing pain and dysfunction/weakness in L hip. Has had MRI of hip, and has had other/aquatic therapy as well with little relief. He has a 2 wheeled walker at home, today is not using any AD.    PERTINENT HISTORY:  Chronic hip and LBP, previous PT for low back  PAIN:  Are you having pain? Yes: NPRS scale: 6/10 Pain location: L hip, gr troch, into L thigh  Pain description: radiating, sore  Aggravating factors: use of leg, walking,  Relieving factors: none stated    PRECAUTIONS: Fall  WEIGHT BEARING RESTRICTIONS No  FALLS:  Has patient fallen in last 6 months? No  LIVING ENVIRONMENT: Stairs at home- steps to get in from garage with hand rails, stairs in home with bedroom downstairs  OCCUPATION: retired  PLOF: [place]  decreased pain in hip   OBJECTIVE:    SCREENING FOR RED FLAGS: No findings  COGNITION:  Overall cognitive status: Within functional limits for tasks assessed     SENSATION: WFL   Palpation:  most pain at gr troch, glute med, piriformis,  states pain can come down into anterior thigh with standing and walking.     LUMBAR ROM:   Active  AROM  eval  Flexion Mild limitation  Extension Slightly past neutral  Right lateral flexion   Left lateral flexion   Right rotation   Left rotation    (Blank rows = not tested)  Hip ROM:   Hip flex: mild limitation, IR/ ER: mild limitation  LOWER EXTREMITY MMT:    Hip strength L R  Flexion 3- 4-  Extension    Abduction 3- 3+                 TODAY'S TREATMENT    06/15/22: Therapeutic Exercise: Aerobic:  Bike L1/2, x  7 min;  Supine:   SLR  2 x 10 on L;  Bridging 2 x 10;  Seated:     Standing:   Hip abd and extension 2 x 10 on L;  Step ups fwd/lat x 12 ea, 1 UE support;  Stretches:  Seated HSS 30 sec x 3;  Neuromuscular Re-education: Manual Therapy: long leg distraction bil x 2 min on L Modalities:     06/11/22: Therapeutic Exercise: Aerobic:  Supine:  Supine march x 20 with TA; ; SLR  2 x 10 on L;  Bridging 2 x 10;  Prone: hip ext x 10 bil;  Seated:     S/L : clams 2 x 10;  hip abd- unable/ pain Standing:   Hip abd and extension 2 x 10 on L;  Stretches:  DKTC 30 sec x 3  LTR x 15;  Seated HSS 30 sec x 3;  Neuromuscular Re-education: Manual Therapy: long leg distraction bil x 3 min on L; manual hip stretching for flex, IR, ER;  Modalities:    06/08/22: Therapeutic Exercise: Aerobic:  Supine:      Prone: hip ext x 10 bil;  Seated:     S/L : hip abd- unable/ pain Standing:    Stretches:   pelvic tilts x 20; LTR x 15;  Neuromuscular Re-education: Manual Therapy: long leg distraction bil x 3 min on L; manual hip stretching for flex, IR, ER; Prone quad stretching and PROM for hip IR/ER; STM and IASTM to gr troch and L glute.  Modalities: Hot pack x 12 min after treatment (prior to ionto patch) Ionto, 4 hr patch with dexamethasone to L gr troch.    06/04/22: Therapeutic Exercise: Aerobic:  Supine:  Bridging x 10; SLR 2 x 5 on L;      Seated:   LAQ no weight x 10 ea,  3lb x 10 ea;  S/L : hip abd 2 x 10 ;  S/L clams x 15;  Standing:   Step ups 4 in x 10, 6 in x 10 with 1 UE support;  Stretches:    Seated HSS 30 sec x 3 bil;  Neuromuscular Re-education: Manual Therapy: long leg distraction bil x 2 min;   PATIENT EDUCATION:  Education details: Reviewed HEP Person educated: Patient and Spouse Education method: Explanation, Demonstration, Tactile cues, Verbal cues, and Handouts Education comprehension: verbalized understanding,  returned demonstration, verbal cues required, tactile cues required, and needs further education   HOME EXERCISE PROGRAM: [email or web address]     old  Access Code: [email or web address]   new    ASSESSMENT:  CLINICAL IMPRESSION:  06/15/22:  Reviewed HEP, and which exercises to do when in pain and when not in pain. He has better tolerance for exercises today, and at some visits vs others, depending on pain level. He continues to have significant weakness in hip for flex and abd, and will benefit from continued work on this as able .  Eval: Patient  presents with primary complaint of ongoing, significant pain in L hip. He has had hip deficits for 2 years now. Today, he has severe weakness of L hip, that seems to be main limiting factor. He has poor gait mechanics, poor ability for stairs and functional activity due to weakness and instability and pain. He is not currently doing focused strengthening for his current deficits. He will benefit from skilled PT for focused attention to hip strength, function, gait, and hip pain relief.    OBJECTIVE IMPAIRMENTS Abnormal gait, decreased activity tolerance,  decreased balance, decreased mobility, difficulty walking, decreased ROM, decreased strength, increased muscle spasms, impaired flexibility, improper body mechanics, postural dysfunction, and pain.   ACTIVITY LIMITATIONS carrying, lifting, bending, sitting, standing, stairs, transfers, and locomotion level  PARTICIPATION LIMITATIONS: meal prep, cleaning, laundry, shopping, community activity, and yard work  PERSONAL FACTORS Past/current experiences, Time since onset of injury/illness/exacerbation, and 1-2 comorbidities: trochanteric bursitis, progression of ambulation limitations  are also affecting patient's functional outcome.   REHAB POTENTIAL: Good  CLINICAL DECISION MAKING: Evolving/moderate complexity  EVALUATION COMPLEXITY: Moderate   GOALS: Goals reviewed with patient? Yes  SHORT TERM GOALS: Target date:  06/01/2022   Pt to be independent with initial HEP Goal status: INITIAL   LONG TERM GOALS: Target date: 07/13/2022   Pt to be independent with final HEP Goal status: INITIAL   2.  Pt to demo improved strength of L hip to at least 4/5 to improve stability, gait pattern, and pain.   Goal status: INITIAL  3.  Pt to demo optimal gait mechanics with use of LRAD, to improve function and community navigation.   Goal status: INITIAL  4.  Able to safely navigate stairs, step-over-step and single hand rail use   Goal status: INITIAL    PLAN: PT FREQUENCY: 1-2x/week  PT DURATION: 8 weeks  PLANNED INTERVENTIONS: Therapeutic exercises, Therapeutic activity, Neuromuscular re-education, Balance training, Gait training, Patient/Family education, Self Care, Joint mobilization, Joint manipulation, Stair training, Aquatic Therapy, Dry Needling, Electrical stimulation, Spinal manipulation, Spinal mobilization, Cryotherapy, Moist heat, Taping, Traction, Ultrasound, Ionotophoresis '4mg'$ /ml Dexamethasone, Manual therapy, and Re-evaluation.  PLAN FOR NEXT SESSION:    Lyndee Hensen, PT, DPT 12:58 PM  06/15/22

## 2022-06-18 ENCOUNTER — Encounter: Payer: Self-pay | Admitting: Physical Therapy

## 2022-06-18 ENCOUNTER — Ambulatory Visit (INDEPENDENT_AMBULATORY_CARE_PROVIDER_SITE_OTHER): Payer: Medicare Other | Admitting: Physical Therapy

## 2022-06-18 DIAGNOSIS — M6281 Muscle weakness (generalized): Secondary | ICD-10-CM

## 2022-06-18 DIAGNOSIS — M25552 Pain in left hip: Secondary | ICD-10-CM

## 2022-06-18 DIAGNOSIS — M5459 Other low back pain: Secondary | ICD-10-CM | POA: Diagnosis not present

## 2022-06-18 NOTE — Therapy (Signed)
OUTPATIENT PHYSICAL THERAPY Treatment /Progress Note   Patient Name: Benjamin Davila MRN: [id number] DOB:[birth year], 82 y.o., male Today's Date: 06/18/2022   Physical Therapy Progress Note  Dates of Reporting Period: 05/18/22  to 1/29/4      PT End of Session - 06/18/22 1015     Visit Number 10    Number of Visits 16    Date for PT Re-Evaluation 07/13/22    Authorization Type BCBS Medicare    PT Start Time 1016    PT Stop Time 1102    PT Time Calculation (min) 46 min    Activity Tolerance Patient tolerated treatment well    Behavior During Therapy [facility] for tasks assessed/performed                   Past Medical History:  Diagnosis Date   Aortic regurgitation 07/14/2020   Aortic stenosis, moderate 03/17/2019   Autoimmune hepatitis ([place]) 07/12/2020   Benign prostatic hyperplasia without lower urinary tract symptoms 07/12/2020   Cardiac murmur 12/05/2018   Diabetes mellitus due to underlying condition with unspecified complications ([place]) 17/40/8144   Essential hypertension 12/05/2018   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   History of pancreatitis 07/12/2020   History of pancytopenia 07/12/2020   Hyperlipidemia, unspecified 07/12/2020   Irregular heart beat 07/12/2020   Long term (current) use of insulin ([place]) 07/12/2020   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   Lumbar radiculopathy 06/17/2020   Lumbar spondylosis 07/28/2020   Malignant neoplasm of prostate ([place]) 06/24/2019   Mood disorder ([place]) 07/12/2020   Pain of left hip joint 08/25/2019   PONV (postoperative nausea and vomiting)    Prostate cancer ([place])    Spinal stenosis of lumbar region 81/85/6314   Systolic murmur 97/06/6376   Trochanteric bursitis of left hip 03/12/2021   Trochanteric bursitis of right hip 03/12/2021   Type 2 diabetes mellitus with other specified complication ([place]) 58/85/0277   Past Surgical History:  Procedure Laterality Date   BACK SURGERY     lumbar surgery 20 years ago per  patient   CHOLECYSTECTOMY     20 years ago per patient   LUMBAR LAMINECTOMY/DECOMPRESSION MICRODISCECTOMY Bilateral 08/09/2021   Procedure: Lumbar decompressive laminectomy  - Lumbar two-Lumabr three - Lumbar three-Lumbar four - Lumbar four-Lumbar five - Lumabr five-Sacal one - Bilateral, Posterior Lateral Fusion Lumbar three-four-Right;  Surgeon: Eustace Moore, MD;  Location: [facility];  Service: Neurosurgery;  Laterality: Bilateral;   TONSILLECTOMY     as a kid   Patient Active Problem List   Diagnosis Date Noted   S/P lumbar laminectomy 08/09/2021   Trochanteric bursitis of left hip 03/12/2021   Trochanteric bursitis of right hip 03/12/2021   Lumbar spondylosis 07/28/2020   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   Aortic regurgitation 07/14/2020   Autoimmune hepatitis ([place]) 07/12/2020   Benign prostatic hyperplasia without lower urinary tract symptoms 07/12/2020   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   History of pancreatitis 07/12/2020   History of pancytopenia 07/12/2020   Hyperlipidemia, unspecified 07/12/2020   Irregular heart beat 07/12/2020   Long term (current) use of insulin ([place]) 07/12/2020   Mood disorder ([place]) 41/28/7867   Systolic murmur 67/20/9470   Type 2 diabetes mellitus with other specified complication ([place]) 96/28/3662   Prostate cancer ([place])    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   Lumbar radiculopathy 06/17/2020   Pain of left hip joint 08/25/2019   Malignant neoplasm of prostate ([place]) 06/24/2019   Aortic stenosis, moderate 03/17/2019   Cardiac murmur 12/05/2018  Essential hypertension 12/05/2018   Diabetes mellitus due to underlying condition with unspecified complications ([place]) 96/29/5284    PCP: Jillyn Ledger, FNP  REFERRING PROVIDER: [name]   REFERRING DIAG: L hip pain/trochanteric bursitis  Rationale for Evaluation and Treatment Rehabilitation  THERAPY DIAG:  Pain in left hip  Muscle weakness (generalized)  Other low back pain  ONSET  DATE: Lumbar Laminectomy & Microdiskectomy on 08/09/21  SUBJECTIVE:                                                                                                                                                                                           SUBJECTIVE STATEMENT: 06/18/22: pt states continued,   Eval: Pt has had ongoing pain in hip for a few years now. He has had back surgery in March 2023. He continues to have ongoing pain and dysfunction/weakness in L hip. Has had MRI of hip, and has had other/aquatic therapy as well with little relief. He has a 2 wheeled walker at home, today is not using any AD.    PERTINENT HISTORY:  Chronic hip and LBP, previous PT for low back  PAIN:  Are you having pain? Yes: NPRS scale: 6/10 Pain location: L hip, gr troch, into L thigh  Pain description: radiating, sore  Aggravating factors: use of leg, walking,  Relieving factors: none stated    PRECAUTIONS: Fall  WEIGHT BEARING RESTRICTIONS No  FALLS:  Has patient fallen in last 6 months? No  LIVING ENVIRONMENT: Stairs at home- steps to get in from garage with hand rails, stairs in home with bedroom downstairs  OCCUPATION: retired  PLOF: [place]  decreased pain in hip   OBJECTIVE: updated 06/18/22   SCREENING FOR RED FLAGS: No findings  COGNITION:  Overall cognitive status: Within functional limits for tasks assessed     SENSATION: WFL   Palpation:  most pain at gr troch, glute med, piriformis,  states pain can come down into anterior thigh with standing and walking.     LUMBAR ROM:   Active  AROM  eval  Flexion Mild limitation  Extension Slightly past neutral  Right lateral flexion   Left lateral flexion   Right rotation   Left rotation    (Blank rows = not tested)  Hip ROM:   Hip flex: mild limitation, IR/ ER: mild limitation  LOWER EXTREMITY MMT:    Hip strength L R L 05/2922  Flexion 3- 4- 3+  Extension     Abduction 3- 3+ 3+                     TODAY'S  TREATMENT   06/18/22: Therapeutic Exercise: Aerobic:  Bike L1/2, x 7 min;  Supine:   SLR  2 x 10 on L;   Seated:     Standing:   Hip abd and extension 2 x 10 on L;    Stretches:  Seated HSS 30 sec x 3;  Neuromuscular Re-education: Manual Therapy: long leg distraction bil x 3 min on L; hip inf mobs with strap x 2 min; DTM to L gr troch and glute;  Modalities:    06/15/22: Therapeutic Exercise: Aerobic:  Bike L1/2, x 8 min;  Supine:   SLR  2 x 10 on L;  Bridging 2 x 10;  Seated:     Standing:   Hip abd and extension 2 x 10 on L;  Step ups fwd/lat x 12 ea, 1 UE support;  Stretches:  Seated HSS 30 sec x 3;  Neuromuscular Re-education: Manual Therapy: long leg distraction bil x 2 min on L Modalities:    06/11/22: Therapeutic Exercise: Aerobic:  Supine:  Supine march x 20 with TA; ; SLR  2 x 10 on L;  Bridging 2 x 10;  Prone: hip ext x 10 bil;  Seated:     S/L : clams 2 x 10;  hip abd- unable/ pain Standing:   Hip abd and extension 2 x 10 on L;  Stretches:  DKTC 30 sec x 3  LTR x 15;  Seated HSS 30 sec x 3;  Neuromuscular Re-education: Manual Therapy: long leg distraction bil x 3 min on L; manual hip stretching for flex, IR, ER;  Modalities:      PATIENT EDUCATION:  Education details: Reviewed HEP Person educated: Patient and Spouse Education method: Explanation, Demonstration, Tactile cues, Verbal cues, and Handouts Education comprehension: verbalized understanding, returned demonstration, verbal cues required, tactile cues required, and needs further education   HOME EXERCISE PROGRAM: [email or web address]     old  Access Code: [email or web address]   new    ASSESSMENT:  CLINICAL IMPRESSION:  06/18/22: PN:  Pt has been seen for 10 visits. He has most pain to palpate at gr troch and glute muscles, but more bothersome pain is the shooting pains into his anterior thigh with standing and walking activity. He does continue to have much weakness in hips , but it is still  difficulty to identify source of pain. Radiating pain into thigh seems more like lumbar radiculopathy in the last few visits. Shooting pains into thigh are not reproduced with hip ROM/testing. Pt to benefit from continued strengthening. He is still having much difficulty with functional activity due to amount of pain.    Reviewed HEP, and which exercises to do when in pain and when not in pain. He has better tolerance for exercises today, and at some visits vs others, depending on pain level. He continues to have significant weakness in hip for flex and abd, and will benefit from continued work on this as able .  Eval: Patient  presents with primary complaint of ongoing, significant pain in L hip. He has had hip deficits for 2 years now. Today, he has severe weakness of L hip, that seems to be main limiting factor. He has poor gait mechanics, poor ability for stairs and functional activity due to weakness and instability and pain. He is not currently doing focused strengthening for his current deficits. He will benefit from skilled PT for focused attention to hip strength, function, gait, and hip pain relief.    OBJECTIVE IMPAIRMENTS Abnormal gait, decreased activity  tolerance, decreased balance, decreased mobility, difficulty walking, decreased ROM, decreased strength, increased muscle spasms, impaired flexibility, improper body mechanics, postural dysfunction, and pain.   ACTIVITY LIMITATIONS carrying, lifting, bending, sitting, standing, stairs, transfers, and locomotion level  PARTICIPATION LIMITATIONS: meal prep, cleaning, laundry, shopping, community activity, and yard work  PERSONAL FACTORS Past/current experiences, Time since onset of injury/illness/exacerbation, and 1-2 comorbidities: trochanteric bursitis, progression of ambulation limitations  are also affecting patient's functional outcome.   REHAB POTENTIAL: Good  CLINICAL DECISION MAKING: Evolving/moderate complexity  EVALUATION  COMPLEXITY: Moderate   GOALS: Goals reviewed with patient? Yes  SHORT TERM GOALS: Target date: 06/01/2022   Pt to be independent with initial HEP Goal status: MET   LONG TERM GOALS: Target date: 07/13/2022   Pt to be independent with final HEP Goal status: IN PROGRESS   2.  Pt to demo improved strength of L hip to at least 4/5 to improve stability, gait pattern, and pain.   Goal status: NOT MET  3.  Pt to demo optimal gait mechanics with use of LRAD, to improve function and community navigation.   Goal status: IN PROGRESS  4.  Able to safely navigate stairs, step-over-step and single hand rail use   Goal status: IN PROGRESS    PLAN: PT FREQUENCY: 1-2x/week  PT DURATION: 8 weeks  PLANNED INTERVENTIONS: Therapeutic exercises, Therapeutic activity, Neuromuscular re-education, Balance training, Gait training, Patient/Family education, Self Care, Joint mobilization, Joint manipulation, Stair training, Aquatic Therapy, Dry Needling, Electrical stimulation, Spinal manipulation, Spinal mobilization, Cryotherapy, Moist heat, Taping, Traction, Ultrasound, Ionotophoresis '4mg'$ /ml Dexamethasone, Manual therapy, and Re-evaluation.  PLAN FOR NEXT SESSION:    Lyndee Hensen, PT, DPT 11:22 AM  06/18/22

## 2022-06-22 ENCOUNTER — Ambulatory Visit (INDEPENDENT_AMBULATORY_CARE_PROVIDER_SITE_OTHER): Payer: Medicare Other | Admitting: Physical Therapy

## 2022-06-22 ENCOUNTER — Encounter: Payer: Self-pay | Admitting: Physical Therapy

## 2022-06-22 DIAGNOSIS — R262 Difficulty in walking, not elsewhere classified: Secondary | ICD-10-CM

## 2022-06-22 DIAGNOSIS — M6281 Muscle weakness (generalized): Secondary | ICD-10-CM

## 2022-06-22 DIAGNOSIS — M5459 Other low back pain: Secondary | ICD-10-CM

## 2022-06-22 DIAGNOSIS — M25552 Pain in left hip: Secondary | ICD-10-CM | POA: Diagnosis not present

## 2022-06-22 NOTE — Therapy (Signed)
OUTPATIENT PHYSICAL THERAPY Treatment    Patient Name: Benjamin Davila MRN: [id number] DOB:1940-08-18, 82 y.o., male Today's Date: 06/22/2022     PT End of Session - 06/22/22 1029     Visit Number 11    Number of Visits 16    Date for PT Re-Evaluation 07/13/22    Authorization Type BCBS Medicare    PT Start Time 1017    PT Stop Time 1100    PT Time Calculation (min) 43 min    Activity Tolerance Patient tolerated treatment well    Behavior During Therapy [facility] for tasks assessed/performed                    Past Medical History:  Diagnosis Date   Aortic regurgitation 07/14/2020   Aortic stenosis, moderate 03/17/2019   Autoimmune hepatitis ([place]) 07/12/2020   Benign prostatic hyperplasia without lower urinary tract symptoms 07/12/2020   Cardiac murmur 12/05/2018   Diabetes mellitus due to underlying condition with unspecified complications ([place]) 37/90/2409   Essential hypertension 12/05/2018   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   History of pancreatitis 07/12/2020   History of pancytopenia 07/12/2020   Hyperlipidemia, unspecified 07/12/2020   Irregular heart beat 07/12/2020   Long term (current) use of insulin ([place]) 07/12/2020   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   Lumbar radiculopathy 06/17/2020   Lumbar spondylosis 07/28/2020   Malignant neoplasm of prostate ([place]) 06/24/2019   Mood disorder ([place]) 07/12/2020   Pain of left hip joint 08/25/2019   PONV (postoperative nausea and vomiting)    Prostate cancer ([place])    Spinal stenosis of lumbar region 73/53/2992   Systolic murmur 42/68/3419   Trochanteric bursitis of left hip 03/12/2021   Trochanteric bursitis of right hip 03/12/2021   Type 2 diabetes mellitus with other specified complication ([place]) 62/22/9798   Past Surgical History:  Procedure Laterality Date   BACK SURGERY     lumbar surgery 20 years ago per patient   CHOLECYSTECTOMY     20 years ago per patient   LUMBAR LAMINECTOMY/DECOMPRESSION  MICRODISCECTOMY Bilateral 08/09/2021   Procedure: Lumbar decompressive laminectomy  - Lumbar two-Lumabr three - Lumbar three-Lumbar four - Lumbar four-Lumbar five - Lumabr five-Sacal one - Bilateral, Posterior Lateral Fusion Lumbar three-four-Right;  Surgeon: Eustace Moore, MD;  Location: [place];  Service: Neurosurgery;  Laterality: Bilateral;   TONSILLECTOMY     as a kid   Patient Active Problem List   Diagnosis Date Noted   S/P lumbar laminectomy 08/09/2021   Trochanteric bursitis of left hip 03/12/2021   Trochanteric bursitis of right hip 03/12/2021   Lumbar spondylosis 07/28/2020   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   Aortic regurgitation 07/14/2020   Autoimmune hepatitis ([place]) 07/12/2020   Benign prostatic hyperplasia without lower urinary tract symptoms 07/12/2020   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   History of pancreatitis 07/12/2020   History of pancytopenia 07/12/2020   Hyperlipidemia, unspecified 07/12/2020   Irregular heart beat 07/12/2020   Long term (current) use of insulin ([place]) 07/12/2020   Mood disorder ([place]) 92/03/9416   Systolic murmur 40/81/4481   Type 2 diabetes mellitus with other specified complication ([place]) 85/63/1497   Prostate cancer ([place])    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   Lumbar radiculopathy 06/17/2020   Pain of left hip joint 08/25/2019   Malignant neoplasm of prostate ([place]) 06/24/2019   Aortic stenosis, moderate 03/17/2019   Cardiac murmur 12/05/2018   Essential hypertension 12/05/2018   Diabetes mellitus due to underlying condition with unspecified complications ([place]) 02/63/7858  PCP: Jillyn Ledger, FNP  REFERRING PROVIDER: [name]   REFERRING DIAG: L hip pain/trochanteric bursitis  Rationale for Evaluation and Treatment Rehabilitation  THERAPY DIAG:  Pain in left hip  Muscle weakness (generalized)  Other low back pain  Difficulty in walking, not elsewhere classified  ONSET DATE: Lumbar Laminectomy &  Microdiskectomy on 08/09/21  SUBJECTIVE:                                                                                                                                                                                           SUBJECTIVE STATEMENT: 06/22/22 : Pt with no new complaints.   Eval: Pt has had ongoing pain in hip for a few years now. He has had back surgery in March 2023. He continues to have ongoing pain and dysfunction/weakness in L hip. Has had MRI of hip, and has had other/aquatic therapy as well with little relief. He has a 2 wheeled walker at home, today is not using any AD.    PERTINENT HISTORY:  Chronic hip and LBP, previous PT for low back  PAIN:  Are you having pain? Yes: NPRS scale: 6/10 Pain location: L hip, gr troch, into L thigh  Pain description: radiating, sore  Aggravating factors: use of leg, walking,  Relieving factors: none stated    PRECAUTIONS: Fall  WEIGHT BEARING RESTRICTIONS No  FALLS:  Has patient fallen in last 6 months? No  LIVING ENVIRONMENT: Stairs at home- steps to get in from garage with hand rails, stairs in home with bedroom downstairs  OCCUPATION: retired  PLOF: [place]  decreased pain in hip   OBJECTIVE: updated 06/18/22   SCREENING FOR RED FLAGS: No findings  COGNITION:  Overall cognitive status: Within functional limits for tasks assessed     SENSATION: WFL   Palpation:  most pain at gr troch, glute med, piriformis,  states pain can come down into anterior thigh with standing and walking.     LUMBAR ROM:   Active  AROM  eval  Flexion Mild limitation  Extension Slightly past neutral  Right lateral flexion   Left lateral flexion   Right rotation   Left rotation    (Blank rows = not tested)  Hip ROM:   Hip flex: mild limitation, IR/ ER: mild limitation  LOWER EXTREMITY MMT:    Hip strength L R L 05/2922  Flexion 3- 4- 3+  Extension     Abduction 3- 3+ 3+                     TODAY'S TREATMENT   06/22/22: Therapeutic Exercise: Aerobic:  Bike  2, x 8 min;  Supine:   SLR  2 x 10 on L;  alternating clams GTB with TA x 25;  S/L hip abd 2 x 5;  Seated:   sit to stand 3 x 5 slow to chair;  Standing:  Hip abd 3 x 5 on L;  Marching x 15;  Stretches:  Neuromuscular Re-education: Manual Therapy:   Modalities:    06/18/22: Therapeutic Exercise: Aerobic:  Bike L1/2, x 7 min;  Supine:   SLR  2 x 10 on L;   Seated:     Standing:   Hip abd and extension 2 x 10 on L;    Stretches:  Seated HSS 30 sec x 3;  Neuromuscular Re-education: Manual Therapy: long leg distraction bil x 3 min on L; hip inf mobs with strap x 2 min; DTM to L gr troch and glute;  Modalities:    06/15/22: Therapeutic Exercise: Aerobic:  Bike L1/2, x 8 min;  Supine:   SLR  2 x 10 on L;  Bridging 2 x 10;  Seated:     Standing:   Hip abd and extension 2 x 10 on L;  Step ups fwd/lat x 12 ea, 1 UE support;  Stretches:  Seated HSS 30 sec x 3;  Neuromuscular Re-education: Manual Therapy: long leg distraction bil x 2 min on L Modalities:    06/11/22: Therapeutic Exercise: Aerobic:  Supine:  Supine march x 20 with TA; ; SLR  2 x 10 on L;  Bridging 2 x 10;  Prone: hip ext x 10 bil;  Seated:     S/L : clams 2 x 10;  hip abd- unable/ pain Standing:   Hip abd and extension 2 x 10 on L;  Stretches:  DKTC 30 sec x 3  LTR x 15;  Seated HSS 30 sec x 3;  Neuromuscular Re-education: Manual Therapy: long leg distraction bil x 3 min on L; manual hip stretching for flex, IR, ER;  Modalities:      PATIENT EDUCATION:  Education details: Reviewed HEP Person educated: Patient and Spouse Education method: Explanation, Demonstration, Tactile cues, Verbal cues, and Handouts Education comprehension: verbalized understanding, returned demonstration, verbal cues required, tactile cues required, and needs further education   HOME EXERCISE PROGRAM: [email or web address]     old  Access Code: [email or web address]   new     ASSESSMENT:  CLINICAL IMPRESSION:  06/22/22: Pt still with significant limp without use of RW , with trial today, due to weakness in L hip.  Pt in quite a bit of pain today, but still able to perform and do well with some strengthening exercises. Will benefit from continued strengthening as able.   Eval: Patient  presents with primary complaint of ongoing, significant pain in L hip. He has had hip deficits for 2 years now. Today, he has severe weakness of L hip, that seems to be main limiting factor. He has poor gait mechanics, poor ability for stairs and functional activity due to weakness and instability and pain. He is not currently doing focused strengthening for his current deficits. He will benefit from skilled PT for focused attention to hip strength, function, gait, and hip pain relief.    OBJECTIVE IMPAIRMENTS Abnormal gait, decreased activity tolerance, decreased balance, decreased mobility, difficulty walking, decreased ROM, decreased strength, increased muscle spasms, impaired flexibility, improper body mechanics, postural dysfunction, and pain.   ACTIVITY LIMITATIONS carrying, lifting, bending, sitting, standing, stairs, transfers, and locomotion level  PARTICIPATION LIMITATIONS: meal prep, cleaning, laundry, shopping,  community activity, and yard work  PERSONAL FACTORS Past/current experiences, Time since onset of injury/illness/exacerbation, and 1-2 comorbidities: trochanteric bursitis, progression of ambulation limitations  are also affecting patient's functional outcome.   REHAB POTENTIAL: Good  CLINICAL DECISION MAKING: Evolving/moderate complexity  EVALUATION COMPLEXITY: Moderate   GOALS: Goals reviewed with patient? Yes  SHORT TERM GOALS: Target date: 06/01/2022   Pt to be independent with initial HEP Goal status: MET   LONG TERM GOALS: Target date: 07/13/2022   Pt to be independent with final HEP Goal status: IN PROGRESS   2.  Pt to demo improved  strength of L hip to at least 4/5 to improve stability, gait pattern, and pain.   Goal status: NOT MET  3.  Pt to demo optimal gait mechanics with use of LRAD, to improve function and community navigation.   Goal status: IN PROGRESS  4.  Able to safely navigate stairs, step-over-step and single hand rail use   Goal status: IN PROGRESS   PLAN: PT FREQUENCY: 1-2x/week  PT DURATION: 8 weeks  PLANNED INTERVENTIONS: Therapeutic exercises, Therapeutic activity, Neuromuscular re-education, Balance training, Gait training, Patient/Family education, Self Care, Joint mobilization, Joint manipulation, Stair training, Aquatic Therapy, Dry Needling, Electrical stimulation, Spinal manipulation, Spinal mobilization, Cryotherapy, Moist heat, Taping, Traction, Ultrasound, Ionotophoresis '4mg'$ /ml Dexamethasone, Manual therapy, and Re-evaluation.  PLAN FOR NEXT SESSION:    Lyndee Hensen, PT, DPT 1:25 PM  06/22/22

## 2022-06-26 ENCOUNTER — Encounter: Payer: Self-pay | Admitting: Physical Therapy

## 2022-06-26 ENCOUNTER — Ambulatory Visit (INDEPENDENT_AMBULATORY_CARE_PROVIDER_SITE_OTHER): Payer: Medicare Other | Admitting: Physical Therapy

## 2022-06-26 DIAGNOSIS — R262 Difficulty in walking, not elsewhere classified: Secondary | ICD-10-CM

## 2022-06-26 DIAGNOSIS — M25552 Pain in left hip: Secondary | ICD-10-CM

## 2022-06-26 DIAGNOSIS — M5459 Other low back pain: Secondary | ICD-10-CM | POA: Diagnosis not present

## 2022-06-26 DIAGNOSIS — M6281 Muscle weakness (generalized): Secondary | ICD-10-CM | POA: Diagnosis not present

## 2022-06-26 NOTE — Therapy (Signed)
OUTPATIENT PHYSICAL THERAPY Treatment    Patient Name: Benjamin Davila MRN: [id number] DOB:[birth year], 82 y.o., male Today's Date: 06/26/2022     PT End of Session - 06/26/22 0930     Visit Number 12    Number of Visits 16    Date for PT Re-Evaluation 07/13/22    Authorization Type BCBS Medicare    PT Start Time 0935    PT Stop Time 4481    PT Time Calculation (min) 40 min    Activity Tolerance Patient tolerated treatment well    Behavior During Therapy [facility] for tasks assessed/performed                    Past Medical History:  Diagnosis Date   Aortic regurgitation 07/14/2020   Aortic stenosis, moderate 03/17/2019   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/12/2020   Benign prostatic hyperplasia without lower urinary tract symptoms 07/12/2020   Cardiac murmur 12/05/2018   Diabetes mellitus due to underlying condition with unspecified complications ([place]) 85/63/1497   Essential hypertension 12/05/2018   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   History of pancreatitis 07/12/2020   History of pancytopenia 07/12/2020   Hyperlipidemia, unspecified 07/12/2020   Irregular heart beat 07/12/2020   Long term (current) use of insulin ([place]) 07/12/2020   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   Lumbar radiculopathy 06/17/2020   Lumbar spondylosis 07/28/2020   Malignant neoplasm of prostate ([place]) 06/24/2019   Mood disorder ([place]) 07/12/2020   Pain of left hip joint 08/25/2019   PONV (postoperative nausea and vomiting)    Prostate cancer ([place])    Spinal stenosis of lumbar region 02/63/7858   Systolic murmur 85/06/7739   Trochanteric bursitis of left hip 03/12/2021   Trochanteric bursitis of right hip 03/12/2021   Type 2 diabetes mellitus with other specified complication ([place]) 28/78/6767   Past Surgical History:  Procedure Laterality Date   BACK SURGERY     lumbar surgery 20 years ago per patient   CHOLECYSTECTOMY     20 years ago per patient   LUMBAR LAMINECTOMY/DECOMPRESSION  MICRODISCECTOMY Bilateral 08/09/2021   Procedure: Lumbar decompressive laminectomy  - Lumbar two-Lumabr three - Lumbar three-Lumbar four - Lumbar four-Lumbar five - Lumabr five-Sacal one - Bilateral, Posterior Lateral Fusion Lumbar three-four-Right;  Surgeon: Eustace Moore, MD;  Location: [place];  Service: Neurosurgery;  Laterality: Bilateral;   TONSILLECTOMY     as a kid   Patient Active Problem List   Diagnosis Date Noted   S/P lumbar laminectomy 08/09/2021   Trochanteric bursitis of left hip 03/12/2021   Trochanteric bursitis of right hip 03/12/2021   Lumbar spondylosis 07/28/2020   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   Aortic regurgitation 07/14/2020   Autoimmune hepatitis ([place]) 07/12/2020   Benign prostatic hyperplasia without lower urinary tract symptoms 07/12/2020   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   History of pancreatitis 07/12/2020   History of pancytopenia 07/12/2020   Hyperlipidemia, unspecified 07/12/2020   Irregular heart beat 07/12/2020   Long term (current) use of insulin ([place]) 07/12/2020   Mood disorder ([place]) 20/94/7096   Systolic murmur 28/36/6294   Type 2 diabetes mellitus with other specified complication ([place]) 76/54/6503   Prostate cancer ([place])    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   Lumbar radiculopathy 06/17/2020   Pain of left hip joint 08/25/2019   Malignant neoplasm of prostate ([place]) 06/24/2019   Aortic stenosis, moderate 03/17/2019   Cardiac murmur 12/05/2018   Essential hypertension 12/05/2018   Diabetes mellitus due to underlying condition with unspecified complications ([place]) 54/65/6812  PCP: Jillyn Ledger, FNP  REFERRING PROVIDER: [name]   REFERRING DIAG: L hip pain/trochanteric bursitis  Rationale for Evaluation and Treatment Rehabilitation  THERAPY DIAG:  Pain in left hip  Muscle weakness (generalized)  Other low back pain  Difficulty in walking, not elsewhere classified  ONSET DATE: Lumbar Laminectomy &  Microdiskectomy on 08/09/21  SUBJECTIVE:                                                                                                                                                                                           SUBJECTIVE STATEMENT: 06/26/22 : Pt with no new complaints. Had a hard time sleeping last night due to pain.   Eval: Pt has had ongoing pain in hip for a few years now. He has had back surgery in March 2023. He continues to have ongoing pain and dysfunction/weakness in L hip. Has had MRI of hip, and has had other/aquatic therapy as well with little relief. He has a 2 wheeled walker at home, today is not using any AD.    PERTINENT HISTORY:  Chronic hip and LBP, previous PT for low back  PAIN:  Are you having pain? Yes: NPRS scale: 6/10 Pain location: L hip, gr troch, into L thigh  Pain description: radiating, sore  Aggravating factors: use of leg, walking,  Relieving factors: none stated    PRECAUTIONS: Fall  WEIGHT BEARING RESTRICTIONS No  FALLS:  Has patient fallen in last 6 months? No  LIVING ENVIRONMENT: Stairs at home- steps to get in from garage with hand rails, stairs in home with bedroom downstairs  OCCUPATION: retired  PLOF: [place]  decreased pain in hip   OBJECTIVE: updated 06/18/22   SCREENING FOR RED FLAGS: No findings  COGNITION:  Overall cognitive status: Within functional limits for tasks assessed     SENSATION: WFL   Palpation:  most pain at gr troch, glute med, piriformis,  states pain can come down into anterior thigh with standing and walking.     LUMBAR ROM:   Active  AROM  eval  Flexion Mild limitation  Extension Slightly past neutral  Right lateral flexion   Left lateral flexion   Right rotation   Left rotation    (Blank rows = not tested)  Hip ROM:   Hip flex: mild limitation, IR/ ER: mild limitation  LOWER EXTREMITY MMT:    Hip strength L R L 05/2922  Flexion 3- 4- 3+  Extension      Abduction 3- 3+ 3+  TODAY'S TREATMENT    06/26/22: Therapeutic Exercise: Aerobic:  Bike 2, x 8 min;  Supine:   SLR  2 x 10 bil;  alternating clams GTB with TA x 25;  S/L hip abd 5 sec holds (therapist assist) x 10;   Prone: hip ext 2 x 10 bil;  Seated:   LAQ 4 lb x 25 on L;  Standing:   Stretches:  Nerve glides, supine with strap and ankle pump 3 x 10 on L;  Neuromuscular Re-education: Manual Therapy:   Modalities:   06/22/22: Therapeutic Exercise: Aerobic:  Bike 2, x 8 min;  Supine:   SLR  2 x 10 on L;  alternating clams GTB with TA x 25;  S/L hip abd 2 x 5;  Seated:   sit to stand 3 x 5 slow to chair;  Standing:  Hip abd 3 x 5 on L;  Marching x 15;  Stretches:  Neuromuscular Re-education: Manual Therapy:   Modalities:    06/18/22: Therapeutic Exercise: Aerobic:  Bike L1/2, x 7 min;  Supine:   SLR  2 x 10 on L;   Seated:     Standing:   Hip abd and extension 2 x 10 on L;    Stretches:  Seated HSS 30 sec x 3;  Neuromuscular Re-education: Manual Therapy: long leg distraction bil x 3 min on L; hip inf mobs with strap x 2 min; DTM to L gr troch and glute;  Modalities:     PATIENT EDUCATION:  Education details: Reviewed HEP Person educated: Patient and Spouse Education method: Explanation, Demonstration, Tactile cues, Verbal cues, and Handouts Education comprehension: verbalized understanding, returned demonstration, verbal cues required, tactile cues required, and needs further education   HOME EXERCISE PROGRAM: [email or web address]     old  Access Code: [email or web address]   new    ASSESSMENT:  CLINICAL IMPRESSION:  06/26/22:Pt with good tolerance for nerve glides today, added to HEP. Continues to have most pain and difficultly with hip abd. Improved for extension today. Better tolerated on mat table vs standing, due to increased pain on stance leg in standing. Pt still very limited with hip strength and having quite a bit of pain daily.    Pt still with  significant limp without use of RW , with trial today, due to weakness in L hip.  Pt in quite a bit of pain today, but still able to perform and do well with some strengthening exercises. Will benefit from continued strengthening as able.   Eval: Patient  presents with primary complaint of ongoing, significant pain in L hip. He has had hip deficits for 2 years now. Today, he has severe weakness of L hip, that seems to be main limiting factor. He has poor gait mechanics, poor ability for stairs and functional activity due to weakness and instability and pain. He is not currently doing focused strengthening for his current deficits. He will benefit from skilled PT for focused attention to hip strength, function, gait, and hip pain relief.    OBJECTIVE IMPAIRMENTS Abnormal gait, decreased activity tolerance, decreased balance, decreased mobility, difficulty walking, decreased ROM, decreased strength, increased muscle spasms, impaired flexibility, improper body mechanics, postural dysfunction, and pain.   ACTIVITY LIMITATIONS carrying, lifting, bending, sitting, standing, stairs, transfers, and locomotion level  PARTICIPATION LIMITATIONS: meal prep, cleaning, laundry, shopping, community activity, and yard work  PERSONAL FACTORS Past/current experiences, Time since onset of injury/illness/exacerbation, and 1-2 comorbidities: trochanteric bursitis, progression of ambulation limitations  are also affecting patient's functional outcome.  REHAB POTENTIAL: Good  CLINICAL DECISION MAKING: Evolving/moderate complexity  EVALUATION COMPLEXITY: Moderate   GOALS: Goals reviewed with patient? Yes  SHORT TERM GOALS: Target date: 06/01/2022   Pt to be independent with initial HEP Goal status: MET   LONG TERM GOALS: Target date: 07/13/2022   Pt to be independent with final HEP Goal status: IN PROGRESS   2.  Pt to demo improved strength of L hip to at least 4/5 to improve stability, gait pattern, and  pain.   Goal status: NOT MET  3.  Pt to demo optimal gait mechanics with use of LRAD, to improve function and community navigation.   Goal status: IN PROGRESS  4.  Able to safely navigate stairs, step-over-step and single hand rail use   Goal status: IN PROGRESS   PLAN: PT FREQUENCY: 1-2x/week  PT DURATION: 8 weeks  PLANNED INTERVENTIONS: Therapeutic exercises, Therapeutic activity, Neuromuscular re-education, Balance training, Gait training, Patient/Family education, Self Care, Joint mobilization, Joint manipulation, Stair training, Aquatic Therapy, Dry Needling, Electrical stimulation, Spinal manipulation, Spinal mobilization, Cryotherapy, Moist heat, Taping, Traction, Ultrasound, Ionotophoresis '4mg'$ /ml Dexamethasone, Manual therapy, and Re-evaluation.  PLAN FOR NEXT SESSION:    Lyndee Hensen, PT, DPT 10:23 AM  06/26/22

## 2022-06-29 ENCOUNTER — Ambulatory Visit (INDEPENDENT_AMBULATORY_CARE_PROVIDER_SITE_OTHER): Payer: Medicare Other | Admitting: Physical Therapy

## 2022-06-29 ENCOUNTER — Encounter: Payer: Self-pay | Admitting: Physical Therapy

## 2022-06-29 DIAGNOSIS — M6281 Muscle weakness (generalized): Secondary | ICD-10-CM

## 2022-06-29 DIAGNOSIS — M25552 Pain in left hip: Secondary | ICD-10-CM

## 2022-06-29 DIAGNOSIS — R262 Difficulty in walking, not elsewhere classified: Secondary | ICD-10-CM | POA: Diagnosis not present

## 2022-06-29 DIAGNOSIS — M5459 Other low back pain: Secondary | ICD-10-CM | POA: Diagnosis not present

## 2022-06-29 NOTE — Therapy (Addendum)
OUTPATIENT PHYSICAL THERAPY Treatment    Patient Name: Benjamin Davila MRN: [id number] DOB:Sep 18, 1940, 82 y.o., male Today's Date: 06/29/2022     PT End of Session - 06/29/22 1014     Visit Number 13    Number of Visits 16    Date for PT Re-Evaluation 07/13/22    Authorization Type [employer] Medicare    PT Start Time 1016    PT Stop Time 1100    PT Time Calculation (min) 44 min    Activity Tolerance Patient tolerated treatment well    Behavior During Therapy [facility] for tasks assessed/performed                    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   Benign prostatic hyperplasia without lower urinary tract symptoms 07/12/2020   Cardiac murmur 12/05/2018   Diabetes mellitus due to underlying condition with unspecified complications (HCC) 12/05/2018   Essential hypertension 12/05/2018   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   History of pancreatitis 07/12/2020   History of pancytopenia 07/12/2020   Hyperlipidemia, unspecified 07/12/2020   Irregular heart beat 07/12/2020   Long term (current) use of insulin (HCC) 07/12/2020   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   Lumbar radiculopathy 06/17/2020   Lumbar spondylosis 07/28/2020   Malignant neoplasm of prostate (HCC) 06/24/2019   Mood disorder (HCC) 07/12/2020   Pain of left hip joint 08/25/2019   PONV (postoperative nausea and vomiting)    Prostate cancer (HCC)    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   Systolic murmur 07/12/2020   Trochanteric bursitis of left hip 03/12/2021   Trochanteric bursitis of right hip 03/12/2021   Type 2 diabetes mellitus with other specified complication (HCC) 07/12/2020   Past Surgical History:  Procedure Laterality Date   BACK SURGERY     lumbar surgery 20 years ago per patient   CHOLECYSTECTOMY     20 years ago per patient   LUMBAR LAMINECTOMY/DECOMPRESSION  MICRODISCECTOMY Bilateral 08/09/2021   Procedure: Lumbar decompressive laminectomy  - Lumbar two-Lumabr three - Lumbar three-Lumbar four - Lumbar four-Lumbar five - Lumabr five-Sacal one - Bilateral, Posterior Lateral Fusion Lumbar three-four-Right;  Surgeon: Tia Alert, MD;  Location: [facility] OR;  Service: Neurosurgery;  Laterality: Bilateral;   TONSILLECTOMY     as a kid   Patient Active Problem List   Diagnosis Date Noted   S/P lumbar laminectomy 08/09/2021   Trochanteric bursitis of left hip 03/12/2021   Trochanteric bursitis of right hip 03/12/2021   Lumbar spondylosis 07/28/2020   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   Aortic regurgitation 07/14/2020   Autoimmune hepatitis (HCC) 07/12/2020   Benign prostatic hyperplasia without lower urinary tract symptoms 07/12/2020   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   History of pancreatitis 07/12/2020   History of pancytopenia 07/12/2020   Hyperlipidemia, unspecified 07/12/2020   Irregular heart beat 07/12/2020   Long term (current) use of insulin (HCC) 07/12/2020   Mood disorder (HCC) 07/12/2020   Systolic murmur 07/12/2020   Type 2 diabetes mellitus with other specified complication (HCC) 07/12/2020   Prostate cancer (HCC)    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   Lumbar radiculopathy 06/17/2020   Pain of left hip joint 08/25/2019   Malignant neoplasm of prostate (HCC) 06/24/2019   Aortic stenosis, moderate 03/17/2019   Cardiac murmur 12/05/2018   Essential hypertension 12/05/2018   Diabetes mellitus due to underlying condition with unspecified complications (HCC) 12/05/2018  PCP: Peri Maris, FNP  REFERRING PROVIDER: Clementeen Graham   REFERRING DIAG: L hip pain/trochanteric bursitis  Rationale for Evaluation and Treatment Rehabilitation  THERAPY DIAG:  Pain in left hip  Muscle weakness (generalized)  Other low back pain  Difficulty in walking, not elsewhere classified  ONSET DATE: Lumbar Laminectomy &  Microdiskectomy on 08/09/21  SUBJECTIVE:                                                                                                                                                                                           SUBJECTIVE STATEMENT: 06/29/22 : Pt with no new complaints. "No better"   Eval: Pt has had ongoing pain in hip for a few years now. He has had back surgery in March 2023. He continues to have ongoing pain and dysfunction/weakness in L hip. Has had MRI of hip, and has had other/aquatic therapy as well with little relief. He has a 2 wheeled walker at home, today is not using any AD.    PERTINENT HISTORY:  Chronic hip and LBP, previous PT for low back  PAIN:  Are you having pain? Yes: NPRS scale: 6/10 Pain location: L hip, gr troch, into L thigh  Pain description: radiating, sore  Aggravating factors: use of leg, walking,  Relieving factors: none stated    PRECAUTIONS: Fall  WEIGHT BEARING RESTRICTIONS No  FALLS:  Has patient fallen in last 6 months? No  LIVING ENVIRONMENT: Stairs at home- steps to get in from garage with hand rails, stairs in home with bedroom downstairs  OCCUPATION: retired  PLOF: Independent  PATIENT GOALS  decreased pain in hip   OBJECTIVE: updated 06/18/22   SCREENING FOR RED FLAGS: No findings  COGNITION:  Overall cognitive status: Within functional limits for tasks assessed     SENSATION: WFL   Palpation:  most pain at gr troch, glute med, piriformis,  states pain can come down into anterior thigh with standing and walking.     LUMBAR ROM:   Active  AROM  eval  Flexion Mild limitation  Extension Slightly past neutral  Right lateral flexion   Left lateral flexion   Right rotation   Left rotation    (Blank rows = not tested)  Hip ROM:   Hip flex: mild limitation, IR/ ER: mild limitation  LOWER EXTREMITY MMT:    Hip strength L R L 05/2922    Flexion 3- 4- 3+    Extension       Abduction 3- 3+ 3+  TODAY'S TREATMENT    06/29/22: Therapeutic Exercise: Aerobic:  Bike 2, x 8 min;  Supine:   bridging x 20; S/L hip abd 5 sec holds (therapist assist) x 10;   Prone: hip ext 2 x 10 bil;  Seated:   Standing:  up/down 5 steps reciprocal with 1 UE support. Marchign x 20;  hip abd 5 x 5 bil;  Stretches:  Nerve glides, supine with strap and ankle pump 3 x 10 on L;  Neuromuscular Re-education: Manual Therapy:   Modalities:    06/22/22: Therapeutic Exercise: Aerobic:  Bike 2, x 8 min;  Supine:   SLR  2 x 10 on L;  alternating clams GTB with TA x 25;  S/L hip abd 2 x 5;  Seated:   sit to stand 3 x 5 slow to chair;  Standing:  Hip abd 3 x 5 on L;  Marching x 15;  Stretches:  Neuromuscular Re-education: Manual Therapy:   Modalities:    06/18/22: Therapeutic Exercise: Aerobic:  Bike L1/2, x 7 min;  Supine:   SLR  2 x 10 on L;   Seated:     Standing:   Hip abd and extension 2 x 10 on L;    Stretches:  Seated HSS 30 sec x 3;  Neuromuscular Re-education: Manual Therapy: long leg distraction bil x 3 min on L; hip inf mobs with strap x 2 min; DTM to L gr troch and glute;  Modalities:     PATIENT EDUCATION:  Education details: Reviewed HEP Person educated: Patient and Spouse Education method: Explanation, Demonstration, Tactile cues, Verbal cues, and Handouts Education comprehension: verbalized understanding, returned demonstration, verbal cues required, tactile cues required, and needs further education   HOME EXERCISE PROGRAM: [email or web address]     old  Access Code: [email or web address]   new    ASSESSMENT:  CLINICAL IMPRESSION:  06/29/22: Pt with good tolerance for exercises today, and most days that he is here. He states more significant pain later In the day. Continued encouragement for him to do strengthening exercises at home early in the day when he has less pain. Much improved ability for stair climbing today from start of PT. He will benefit form continued care for  strengthening. He will call and make f/u appt with sports med.   Eval: Patient  presents with primary complaint of ongoing, significant pain in L hip. He has had hip deficits for 2 years now. Today, he has severe weakness of L hip, that seems to be main limiting factor. He has poor gait mechanics, poor ability for stairs and functional activity due to weakness and instability and pain. He is not currently doing focused strengthening for his current deficits. He will benefit from skilled PT for focused attention to hip strength, function, gait, and hip pain relief.    OBJECTIVE IMPAIRMENTS Abnormal gait, decreased activity tolerance, decreased balance, decreased mobility, difficulty walking, decreased ROM, decreased strength, increased muscle spasms, impaired flexibility, improper body mechanics, postural dysfunction, and pain.   ACTIVITY LIMITATIONS carrying, lifting, bending, sitting, standing, stairs, transfers, and locomotion level  PARTICIPATION LIMITATIONS: meal prep, cleaning, laundry, shopping, community activity, and yard work  PERSONAL FACTORS Past/current experiences, Time since onset of injury/illness/exacerbation, and 1-2 comorbidities: trochanteric bursitis, progression of ambulation limitations  are also affecting patient's functional outcome.   REHAB POTENTIAL: Good  CLINICAL DECISION MAKING: Evolving/moderate complexity  EVALUATION COMPLEXITY: Moderate   GOALS: Goals reviewed with patient? Yes  SHORT TERM GOALS: Target date: 06/01/2022   Pt to be independent  with initial HEP Goal status: MET   LONG TERM GOALS: Target date: 07/13/2022   Pt to be independent with final HEP Goal status: IN PROGRESS   2.  Pt to demo improved strength of L hip to at least 4/5 to improve stability, gait pattern, and pain.   Goal status: NOT MET  3.  Pt to demo optimal gait mechanics with use of LRAD, to improve function and community navigation.   Goal status: IN PROGRESS  4.   Able to safely navigate stairs, step-over-step and single hand rail use   Goal status: IN PROGRESS   PLAN: PT FREQUENCY: 1-2x/week  PT DURATION: 8 weeks  PLANNED INTERVENTIONS: Therapeutic exercises, Therapeutic activity, Neuromuscular re-education, Balance training, Gait training, Patient/Family education, Self Care, Joint mobilization, Joint manipulation, Stair training, Aquatic Therapy, Dry Needling, Electrical stimulation, Spinal manipulation, Spinal mobilization, Cryotherapy, Moist heat, Taping, Traction, Ultrasound, Ionotophoresis 4mg /ml Dexamethasone, Manual therapy, and Re-evaluation.  PLAN FOR NEXT SESSION:    Sedalia Muta, PT, DPT 10:14 AM  06/29/22  PHYSICAL THERAPY DISCHARGE SUMMARY  Visits from Start of Care: 13   Plan: Patient agrees to discharge.  Patient goals were not met. Patient is being discharged due to - will return to MD, no change in pain.  Sedalia Muta, PT, DPT 12:11 PM  12/17/22

## 2022-07-02 ENCOUNTER — Encounter: Payer: Self-pay | Admitting: Family Medicine

## 2022-07-02 ENCOUNTER — Ambulatory Visit (INDEPENDENT_AMBULATORY_CARE_PROVIDER_SITE_OTHER): Payer: Medicare Other | Admitting: Family Medicine

## 2022-07-02 VITALS — BP 150/84 | HR 82 | Ht 70.0 in | Wt 189.4 lb

## 2022-07-02 DIAGNOSIS — M5416 Radiculopathy, lumbar region: Secondary | ICD-10-CM | POA: Diagnosis not present

## 2022-07-02 NOTE — Progress Notes (Signed)
I, Benjamin Davila, CMA acting as a scribe for Benjamin Leader, MD.  Benjamin Davila is a 82 y.o. male who presents to [place] at [facility] today for an office visit. Of note, pt has a hx of prostate cancer. Pt completed 2 ECSW treatment, most recent on 05/24/22. Pt was last seen by Dr. [name] on 06/01/22 for L lateral hip pain and lumbar radiculopathy and a repeat MRI was order. The MRI was denied and pt cont PT, completing 13 visits.   Today, pt reports minimal change in low back and hip sx, even with PT. Would like to discuss options for pain management.   Pain is persistent radiating down his left leg to the anterior thigh into the lower leg especially at the lateral calf and into the foot.  Dx imaging: 12/18/21 L-spine myelogram             08/09/21 L-spine XR             03/16/21 L-spine XR  Pertinent review of systems: No fevers or chills  Relevant historical information: History of prostate cancer.  Diabetes.   Exam:  BP (!) 150/84   Pulse 82   Ht 5' 10"$  (1.778 m)   Wt 189 lb 6.4 oz (85.9 kg)   SpO2 96%   BMI 27.18 kg/m  General: Well Developed, well nourished, and in no acute distress.   MSK: L-spine: Normal appearing Nontender to palpation spinal midline decreased lumbar motion.  Lower extremity strength decreased hip abduction otherwise improved.    Lab and Radiology Results  CT scan L-spine obtained July 2023 IMPRESSION: 1. Unchanged moderate multilevel lumbar spondylosis as described above, status post L2-L5 posterior decompression. Similar moderate bilateral neuroforaminal stenosis from L2-L3 to L5-S1. 2. Unchanged chronic severe L5 compression deformity. Subacute to chronic nondisplaced fracture of the L2 spinous process. 3. Aortic Atherosclerosis (ICD10-I70.0).     Electronically Signed   By: Titus Dubin M.D.   On: 12/18/2021 12:15    Assessment and Plan: 82 y.o. male with left leg pain.  The underlying etiology of the pain has  been challenging to evaluate.  Has had multiple different trials of treatment prior to Medicare including trochanteric bursa injections nerve root blocks at L2 and L3 and trials of physical therapy.  During my care he has had a good trial of physical therapy most recently from his first visit December 26 extending to February 9.  He had 13 visits of PT as noted above.  Physical therapy was able to successfully improve his hip abduction strength but not reduce his pain.  At this point I do not think that his pain is coming from trochanteric bursitis.  I believe a lot of his pain is lumbar radicular in nature.  It has been sometime since he had advanced imaging lumbar spine.  CT myelogram in July neuroforaminal stenosis following a posterior decompression at L2-L5. Like to look at this area again more thoroughly with an MRI as he has had compression fractures and a different imaging modality may show worsening or different results than the CT scan.  Additionally it has been over half a year since his CT myelogram and his pain has not improved despite adequate conservative management.  If the lumbar spine MRI is nondiagnostic we will proceed to MRI hip.  PDMP not reviewed this encounter. Orders Placed This Encounter  Procedures   MR Lumbar Spine Wo Contrast    Standing Status:   Future    Standing  Expiration Date:   07/03/2023    Order Specific Question:   What is the patient's sedation requirement?    Answer:   No Sedation    Order Specific Question:   Does the patient have a pacemaker or implanted devices?    Answer:   No    Order Specific Question:   Preferred imaging location?    Answer:   [id number] (table limit-550lbs)   No orders of the defined types were placed in this encounter.    Discussed warning signs or symptoms. Please see discharge instructions. Patient expresses understanding.   The above documentation has been reviewed and is accurate and complete Benjamin Davila,  M.D.  Total encounter time 30 minutes including face-to-face time with the patient and, reviewing past medical record, and charting on the date of service.

## 2022-07-02 NOTE — Patient Instructions (Signed)
Thank you for coming in today.   You should hear from MRI scheduling Lumbar Spine within 1 week. If you do not hear please let me know.    Do not do the hip MRI as it is not approved.  The Lumbar spine MRI should be approved soon.

## 2022-07-21 ENCOUNTER — Ambulatory Visit
Admission: RE | Admit: 2022-07-21 | Discharge: 2022-07-21 | Disposition: A | Discharge status: Home / Self Care | Payer: Medicare Other | Source: Ambulatory Visit | Attending: Family Medicine | Admitting: Family Medicine

## 2022-07-21 DIAGNOSIS — M5416 Radiculopathy, lumbar region: Secondary | ICD-10-CM

## 2022-07-24 NOTE — Progress Notes (Signed)
MRI shows potential pinched nerves at left L2-3 left L3-4 right L4-5 and both sides L5-S1.  This could cause some of the pain you are experiencing.  Recommend return to clinic to go over the results in full detail.

## 2022-07-25 ENCOUNTER — Encounter: Payer: Self-pay | Admitting: Family Medicine

## 2022-07-25 ENCOUNTER — Ambulatory Visit (INDEPENDENT_AMBULATORY_CARE_PROVIDER_SITE_OTHER): Payer: Medicare Other | Admitting: Family Medicine

## 2022-07-25 VITALS — BP 150/76 | HR 87 | Ht 70.0 in | Wt 187.8 lb

## 2022-07-25 DIAGNOSIS — M5416 Radiculopathy, lumbar region: Secondary | ICD-10-CM

## 2022-07-25 MED ORDER — DIAZEPAM 5 MG PO TABS: 5.0000 mg | ORAL_TABLET | Freq: Every evening | ORAL | 1 refills | Status: DC | PRN

## 2022-07-25 NOTE — Progress Notes (Signed)
I, Benjamin Davila, CMA acting as a scribe for Benjamin Leader, MD.  Benjamin Davila is a 82 y.o. male who presents to [place] at [facility] today for f/u lumbar radiculopathy and L-spine MRI review. Of note, pt has a hx of prostate cancer. Pt completed 13 PT visits, 2 ECSW treatments, and had a lumbar laminectomy on 08/09/21. Patient was last seen by Dr. [name] on 07/02/2022 and a L-spine MRI was ordered. Today, pt reports minimal change in sx since last visit. Discuss MRI results and recommendations today.   His pain is primarily located at the left lateral hip, left anterior thigh, and radiating down the left leg to the anterior medial shin.  He has some pain in the right lateral hip as well.  His pain is bothersome enough that he is having trouble sleeping.  He has had multiple different trials of medications and notes that diazepam prescribed previously has been quite helpful at bedtime to reduce pain discomfort and help sleep.  Dx imaging: 73/2/24 L-spine MRI 12/18/21 L-spine myelogram             08/09/21 L-spine XR             03/16/21 L-spine XR  Pertinent review of systems: No fevers or chills  Relevant historical information: Diabetes.  History of prostate cancer. Laminectomy at L2-L5  Exam:  BP (!) 150/76   Pulse 87   Ht '5\' 10"'$  (1.778 m)   Wt 187 lb 12.8 oz (85.2 kg)   SpO2 96%   BMI 26.95 kg/m  General: Well Developed, well nourished, and in no acute distress.   MSK: Antalgic gait.    Lab and Radiology Results No results found for this or any previous visit (from the past 72 hour(s)). MR Lumbar Spine Wo Contrast  Result Date: 07/23/2022 CLINICAL DATA:  Low back pain that radiates to the left leg. EXAM: MRI LUMBAR SPINE WITHOUT CONTRAST TECHNIQUE: Multiplanar, multisequence MR imaging of the lumbar spine was performed. No intravenous contrast was administered. COMPARISON:  CT L spine 12/18/21 FINDINGS: Segmentation:  Standard. Alignment: Trace  retrolisthesis of T12 on L1 and L2 on L3. Grade 1 anterolisthesis of L4 on L5. Vertebrae: Redemonstrated chronic compression deformity at L5, unchanged in appearance compared to 12/18/2021. there are no worrisome osseous lesions. Status post laminectomy at L3-L4 Conus medullaris and cauda equina: Conus extends to the L1 level. Conus and cauda equina appear normal. Paraspinal and other soft tissues: There is abnormal T2/FLAIR hyperintense signal abnormality surrounding the bilateral facet joints at the L2-L4 level. Disc levels: T12-L1: Eccentric left disc bulge. Mild bilateral facet degenerative change. No spinal canal stenosis. Mild-to-moderate right and mild left neural foraminal narrowing. L1-L2: No significant disc bulge. Mild bilateral facet degenerative change. Mild spinal canal narrowing. Mild bilateral neural foraminal narrowing. L2-L3: Circumferential disc bulge. Posterior decompression. Mild overall spinal canal narrowing. Moderate right and moderate to severe left neural foraminal narrowing. L3-L4: Posterior decompression. Circumferential disc bulge. No spinal canal narrowing. Severe right and moderate to severe left neural foraminal narrowing. L4-L5: Posterior decompression. Circumferential disc bulge. Moderate bilateral facet degenerative change. No spinal canal narrowing. Severe right and moderate left neural foraminal narrowing. L5-S1: Minimal disc bulge. Moderate bilateral facet degenerative change. No spinal canal narrowing. Moderate to severe bilateral neural foraminal narrowing. IMPRESSION: 1. Abnormal T2/STIR hyperintense signal surrounding the bilateral facet joints at L2-L4. Given history of laminectomy at these levels, this is likely postoperative in nature. Correlate clinically for signs of infection. 2. No  evidence of high-grade spinal canal narrowing. Mild spinal canal narrowing at L1-L2 and L2-L3. 3. Multilevel neural foraminal narrowing, moderate to severe at L2-L3 (left), L3-L4 (left),  L4-L5 (right), and L5-S1 (bilateral). Electronically Signed   By: Marin Roberts M.D.   On: 07/23/2022 09:36    I, Benjamin Davila, personally (independently) visualized and performed the interpretation of the images attached in this note.    Assessment and Plan: 82 y.o. male with left hip thigh and lower leg pain.  Some pain on the right similar as well.  This is a chronic ongoing problem over the last year.  Etiology has been extremely difficult to discover.  He has had multiple different trials of injections including several greater trochanter injections, and intra-articular left hip injection, a left L2 and L3 nerve root block all of which did not provide great symptom control.  I believe the left lateral hip pain is primarily due to the L4 or L5 radiculopathy.  The pain coursing diagonally across the left anterior thigh is probably due to L2 or L3 lumbar radiculopathy.  The left anterior shin and medial shin pain is probably left L4 radiculopathy.  He has neuroforaminal stenosis rated moderate to severe at all of these levels which could correspond to his pain.  He has failed to improve with typical conservative management strategies including physical therapy, injections, and even shockwave treatment for the left lateral hip pain.  Additionally he has had an MRI of his hip last year which did not show any abnormality in the left lateral hip musculature.  I will contact Dr. Davy Davila to discuss next steps.  He may be reasonable to have a retrial of epidural steroid injections or nerve root blocks beyond L2 or L3.  Detailed discussion of his new MRI and where I think his pain is coming from. We reviewed the MRI report, and the pictures and related to his previous imaging reports and images. Total encounter time 40 minutes including face-to-face time with the patient and, reviewing past medical record, and charting on the date of service.      PDMP reviewed during this encounter. No orders of  the defined types were placed in this encounter.  Meds ordered this encounter  Medications   diazepam (VALIUM) 5 MG tablet    Sig: Take 1 tablet (5 mg total) by mouth at bedtime as needed for anxiety (moods).    Dispense:  30 tablet    Refill:  1     Discussed warning signs or symptoms. Please see discharge instructions. Patient expresses understanding.   The above documentation has been reviewed and is accurate and complete Benjamin Davila, M.D.

## 2022-07-25 NOTE — Patient Instructions (Signed)
Thank you for coming in today.   I will talk with Dr Davy Pique.   You should hear from me or his office or both in the next few days.   I anticipate either he or a radiologist will do a series of injections in your back to target the pain in your leg.   Keep me updated.   This is potentially improvable.

## 2022-09-13 ENCOUNTER — Encounter: Payer: Self-pay | Admitting: Nurse Practitioner

## 2022-09-13 ENCOUNTER — Inpatient Hospital Stay (HOSPITAL_BASED_OUTPATIENT_CLINIC_OR_DEPARTMENT_OTHER): Payer: Medicare Other | Admitting: Nurse Practitioner

## 2022-09-13 ENCOUNTER — Inpatient Hospital Stay: Payer: Medicare Other | Attending: Oncology

## 2022-09-13 VITALS — BP 143/86 | HR 88 | Temp 98.1°F | Resp 18 | Ht 70.0 in | Wt 186.5 lb

## 2022-09-13 DIAGNOSIS — Z79899 Other long term (current) drug therapy: Secondary | ICD-10-CM | POA: Diagnosis not present

## 2022-09-13 DIAGNOSIS — K754 Autoimmune hepatitis: Secondary | ICD-10-CM | POA: Insufficient documentation

## 2022-09-13 DIAGNOSIS — I350 Nonrheumatic aortic (valve) stenosis: Secondary | ICD-10-CM | POA: Insufficient documentation

## 2022-09-13 DIAGNOSIS — D509 Iron deficiency anemia, unspecified: Secondary | ICD-10-CM | POA: Diagnosis not present

## 2022-09-13 DIAGNOSIS — Z923 Personal history of irradiation: Secondary | ICD-10-CM | POA: Insufficient documentation

## 2022-09-13 DIAGNOSIS — I10 Essential (primary) hypertension: Secondary | ICD-10-CM | POA: Diagnosis not present

## 2022-09-13 DIAGNOSIS — Z8719 Personal history of other diseases of the digestive system: Secondary | ICD-10-CM | POA: Insufficient documentation

## 2022-09-13 DIAGNOSIS — D649 Anemia, unspecified: Secondary | ICD-10-CM | POA: Diagnosis not present

## 2022-09-13 DIAGNOSIS — N400 Enlarged prostate without lower urinary tract symptoms: Secondary | ICD-10-CM | POA: Insufficient documentation

## 2022-09-13 DIAGNOSIS — E119 Type 2 diabetes mellitus without complications: Secondary | ICD-10-CM | POA: Insufficient documentation

## 2022-09-13 DIAGNOSIS — G8929 Other chronic pain: Secondary | ICD-10-CM | POA: Diagnosis not present

## 2022-09-13 DIAGNOSIS — M549 Dorsalgia, unspecified: Secondary | ICD-10-CM | POA: Diagnosis not present

## 2022-09-13 LAB — CBC WITH DIFFERENTIAL (CANCER CENTER ONLY)
Abs Immature Granulocytes: 0.01 10*3/uL (ref 0.00–0.07)
Basophils Absolute: 0 10*3/uL (ref 0.0–0.1)
Basophils Relative: 1 %
Eosinophils Absolute: 0.3 10*3/uL (ref 0.0–0.5)
Eosinophils Relative: 6 %
HCT: 37.6 % — ABNORMAL LOW (ref 39.0–52.0)
Hemoglobin: 12.1 g/dL — ABNORMAL LOW (ref 13.0–17.0)
Immature Granulocytes: 0 %
Lymphocytes Relative: 28 %
Lymphs Abs: 1.2 10*3/uL (ref 0.7–4.0)
MCH: 30.5 pg (ref 26.0–34.0)
MCHC: 32.2 g/dL (ref 30.0–36.0)
MCV: 94.7 fL (ref 80.0–100.0)
Monocytes Absolute: 0.4 10*3/uL (ref 0.1–1.0)
Monocytes Relative: 10 %
Neutro Abs: 2.4 10*3/uL (ref 1.7–7.7)
Neutrophils Relative %: 55 %
Platelet Count: 205 10*3/uL (ref 150–400)
RBC: 3.97 MIL/uL — ABNORMAL LOW (ref 4.22–5.81)
RDW: 12.3 % (ref 11.5–15.5)
WBC Count: 4.3 10*3/uL (ref 4.0–10.5)
nRBC: 0 % (ref 0.0–0.2)

## 2022-09-13 LAB — FERRITIN: Ferritin: 19 ng/mL — ABNORMAL LOW (ref 24–336)

## 2022-09-13 NOTE — Progress Notes (Signed)
  [facility] OFFICE PROGRESS NOTE   Diagnosis: Iron deficiency anemia  INTERVAL HISTORY:   Benjamin Davila returns as scheduled.  He reports discontinuing oral iron about 6 months ago.  When he was taking iron he noted very mild constipation, no nausea.  He denies bleeding.  Main issue continues to be chronic back pain.  Activity is limited by the pain.  He does not crave ice.  Objective:  Vital signs in last 24 hours:  Blood pressure (!) 143/86, pulse 88, temperature 98.1 F (36.7 C), temperature source Oral, resp. rate 18, height  (1.778 m), weight 186 lb 8 oz (84.6 kg), SpO2 96 %.    HEENT: No thrush or ulcers. Resp: Lungs clear bilaterally. Cardio: Regular rate and rhythm. GI: Abdomen soft and nontender.  No hepatosplenomegaly. Vascular: No leg edema.   Lab Results:  Lab Results  Component Value Date   WBC 4.3 09/13/2022   HGB 12.1 (L) 09/13/2022   HCT 37.6 (L) 09/13/2022   MCV 94.7 09/13/2022   PLT 205 09/13/2022   NEUTROABS 2.4 09/13/2022    Imaging:  No results found.  Medications: I have reviewed the patient's current medications.  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 pylori organisms; ascending colon, cecum, polyp-tubular adenomas; hepatic flexure polyp, tubular adenoma. 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  Hemoglobin is stable at 12.1.  MCV is in the upper normal range.  He reports discontinuing oral iron about 6 months ago.  For now he will remain off of oral iron.  We will contact him with the ferritin level from today with additional recommendations as indicated.  He will return for lab and follow-up in 4 to 6 months.  We are available to see him sooner if needed.  Lonna Cobb ANP/GNP-BC   09/13/2022  11:40 AM

## 2022-09-14 ENCOUNTER — Telehealth: Payer: Self-pay

## 2022-09-14 NOTE — Telephone Encounter (Signed)
Patient gave verbal understanding and had no further questions or concerns  

## 2022-09-14 NOTE — Telephone Encounter (Signed)
-----   Message from Rana Snare, NP sent at 09/14/2022  8:01 AM EDT ----- Please let him know the ferritin level has declined, now 19.  He should resume ferrous sulfate 1 tablet daily.  Follow-up as scheduled.

## 2022-10-08 ENCOUNTER — Ambulatory Visit: Payer: Medicare Other | Admitting: Nurse Practitioner

## 2022-10-08 ENCOUNTER — Other Ambulatory Visit: Payer: Medicare Other

## 2022-10-08 ENCOUNTER — Ambulatory Visit (INDEPENDENT_AMBULATORY_CARE_PROVIDER_SITE_OTHER): Payer: Medicare Other | Admitting: Family Medicine

## 2022-10-08 VITALS — BP 146/84 | HR 83 | Wt 190.0 lb

## 2022-10-08 DIAGNOSIS — M5416 Radiculopathy, lumbar region: Secondary | ICD-10-CM

## 2022-10-08 DIAGNOSIS — M79605 Pain in left leg: Secondary | ICD-10-CM | POA: Diagnosis not present

## 2022-10-08 NOTE — Progress Notes (Signed)
Rubin Payor, PhD, LAT, ATC acting as a scribe for Clementeen Graham, MD.  Benjamin Davila is a 82 y.o. male who presents to [facility] today for continued LBP. Pt was last seen by Dr. Denyse Amass on 07/25/22 and was advised that Dr. Denyse Amass would consult w/ Dr. Lorrine Kin about possible ESI or nerve root block. Today, pt reports the injection that Dr. Lorrine Kin did was ineffective. Pt locates pain both side of his low back, L buttock, w/ radiating pain along the lateral of the L thigh, across the anterior L knee and into L calf. He is now starting to have pain also on his R side.  Dx imaging: 07/21/22 L-spine MRI 12/18/21 L-spine myelogram             08/09/21 L-spine XR             03/16/21 L-spine XR  Pertinent review of systems: No fevers or chills  Relevant historical information: Diabetes.  Prostate cancer.  History of lumbar surgery   Exam:  BP (!) 146/84   Pulse 83   Wt 190 lb (86.2 kg)   SpO2 96%   BMI 27.26 kg/m  General: Well Developed, well nourished, and in no acute distress.   MSK: L-spine and left hip normal.  Normal motion.  Diffusely tender to palpation.    Lab and Radiology Results  EXAM: MRI LUMBAR SPINE WITHOUT CONTRAST   TECHNIQUE: Multiplanar, multisequence MR imaging of the lumbar spine was performed. No intravenous contrast was administered.   COMPARISON:  CT L spine 12/18/21   FINDINGS: Segmentation:  Standard.   Alignment: Trace retrolisthesis of T12 on L1 and L2 on L3. Grade 1 anterolisthesis of L4 on L5.   Vertebrae: Redemonstrated chronic compression deformity at L5, unchanged in appearance compared to 12/18/2021. there are no worrisome osseous lesions. Status post laminectomy at L3-L4   Conus medullaris and cauda equina: Conus extends to the L1 level. Conus and cauda equina appear normal.   Paraspinal and other soft tissues: There is abnormal T2/FLAIR hyperintense signal abnormality surrounding the bilateral  facet joints at the L2-L4 level.   Disc levels:   T12-L1: Eccentric left disc bulge. Mild bilateral facet degenerative change. No spinal canal stenosis. Mild-to-moderate right and mild left neural foraminal narrowing.   L1-L2: No significant disc bulge. Mild bilateral facet degenerative change. Mild spinal canal narrowing. Mild bilateral neural foraminal narrowing.   L2-L3: Circumferential disc bulge. Posterior decompression. Mild overall spinal canal narrowing. Moderate right and moderate to severe left neural foraminal narrowing.   L3-L4: Posterior decompression. Circumferential disc bulge. No spinal canal narrowing. Severe right and moderate to severe left neural foraminal narrowing.   L4-L5: Posterior decompression. Circumferential disc bulge. Moderate bilateral facet degenerative change. No spinal canal narrowing. Severe right and moderate left neural foraminal narrowing.   L5-S1: Minimal disc bulge. Moderate bilateral facet degenerative change. No spinal canal narrowing. Moderate to severe bilateral neural foraminal narrowing.   IMPRESSION: 1. Abnormal T2/STIR hyperintense signal surrounding the bilateral facet joints at L2-L4. Given history of laminectomy at these levels, this is likely postoperative in nature. Correlate clinically for signs of infection. 2. No evidence of high-grade spinal canal narrowing. Mild spinal canal narrowing at L1-L2 and L2-L3. 3. Multilevel neural foraminal narrowing, moderate to severe at L2-L3 (left), L3-L4 (left), L4-L5 (right), and L5-S1 (bilateral).     Electronically Signed   By: Lorenza Cambridge M.D.   On: 07/23/2022 09:36 I, Clementeen Graham, personally (independently) visualized and  performed the interpretation of the images attached in this note.     Assessment and Plan: 82 y.o. male with left leg pain.  Etiology is unclear.  I think it is most likely that his pain is due to lumbar radiculopathy at L3 perhaps L4.  He had what sounds  like a nerve root block and epidural steroid injection with Dr. Lorrine Kin since his last visit with little benefit.  Some of his pain could be because of a hip or femur issue however this was not identified on previous MRIs of the hip.  After discussion plan for nerve conduction study of the left leg to further evaluate the source of his pain and to prove that it is lumbar radiculopathy.  If that is not diagnostic then we can proceed with MRI of the femur to look at his hip and his thigh in higher accuracy.   PDMP not reviewed this encounter. Orders Placed This Encounter  Procedures   Ambulatory referral to Neurology    Referral Priority:   Routine    Referral Type:   Consultation    Referral Reason:   Specialty Services Required    Requested Specialty:   Neurology    Number of Visits Requested:   1   NCV with EMG(electromyography)    L LE NCV Study    Standing Status:   Future    Standing Expiration Date:   10/08/2023    Order Specific Question:   Where should this test be performed?    Answer:   [facility]   No orders of the defined types were placed in this encounter.    Discussed warning signs or symptoms. Please see discharge instructions. Patient expresses understanding.   The above documentation has been reviewed and is accurate and complete Clementeen Graham, M.D. Total encounter time 30 minutes including face-to-face time with the patient and, reviewing past medical record, and charting on the date of service.

## 2022-10-08 NOTE — Patient Instructions (Addendum)
Thank you for coming in today.   Plan for Nerve Conduction Study  Check back after test

## 2022-10-16 ENCOUNTER — Other Ambulatory Visit: Payer: Self-pay

## 2022-10-16 DIAGNOSIS — R202 Paresthesia of skin: Secondary | ICD-10-CM

## 2022-10-19 ENCOUNTER — Ambulatory Visit (INDEPENDENT_AMBULATORY_CARE_PROVIDER_SITE_OTHER): Payer: Medicare Other | Admitting: Neurology

## 2022-10-19 DIAGNOSIS — M5416 Radiculopathy, lumbar region: Secondary | ICD-10-CM

## 2022-10-19 DIAGNOSIS — R202 Paresthesia of skin: Secondary | ICD-10-CM

## 2022-10-19 NOTE — Procedures (Signed)
  [facility]  [address] [place], [address]  [place], [place] [address] Tel: [phone] Fax: [phone] Test Date:  10/19/2022  Patient: [name] DOB: 03-03-1941 Physician: Nita Sickle, DO  Sex: Male Height: 5\' 10"  Ref Phys: Rodolph Bong, MD  ID#: [id number]   Technician:    History: This is a 82 year old man referred for evaluation of left leg paresthesias and pain.  NCV & EMG Findings: Extensive electrodiagnostic testing of the left lower extremity shows:  Left sural and superficial peroneal sensory responses are within normal limits. Left peroneal motor response is absent at the extensor digitorum brevis, and normal at the tibialis anterior.  Left tibial motor responses within normal limits. Left tibial H reflex study shows mildly prolonged latency. Chronic motor axon loss changes are seen affecting the L3, L4, and S1 myotomes bilaterally.  There is no evidence of accompanying active denervation.  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 There is no evidence of a large fiber sensorimotor polyneuropathy affecting the lower extremities.   ___________________________ Nita Sickle, DO    Nerve Conduction Studies   Stim Site NR Peak (ms) Norm Peak (ms) O-P Amp (V) Norm O-P Amp  Left Sup Peroneal Anti Sensory (Ant Lat Mall)  32 C  12 cm    3.0 <4.6 4.4 >3  Left Sural Anti Sensory (Lat Mall)  32 C  Calf    3.2 <4.6 5.7 >3     Stim Site NR Onset (ms) Norm Onset (ms) O-P Amp (mV) Norm O-P Amp Site1 Site2 Delta-0 (ms) Dist (cm) Vel (m/s) Norm Vel (m/s)  Left Peroneal Motor (Ext Dig Brev)  32 C  Ankle *NR  <6.0  >2.5 B Fib Ankle  0.0  >40  B Fib *NR     Poplt B Fib  0.0  >40  Poplt *NR            Left Peroneal TA Motor (Tib Ant)  32 C  Fib Head    3.1 <4.5 5.6 >3 Poplit Fib Head 1.7 9.0 53 >40  Poplit    4.8 <5.7 5.6         Left Tibial Motor (Abd Hall Brev)  32 C  Ankle    4.0  <6.0 4.5 >4 Knee Ankle 10.1 43.0 43 >40  Knee    14.1  3.2          Electromyography   Side Muscle Ins.Act Fibs Fasc Recrt Amp Dur Poly Activation Comment  Left AntTibialis Nml Nml Nml Nml Nml Nml Nml Nml N/A  Left Gastroc Nml Nml Nml *1- *1+ *1+ *1+ Nml N/A  Left Flex Dig Long Nml Nml Nml Nml Nml Nml Nml Nml N/A  Left BicepsFemS Nml Nml Nml *1- *1+ *1+ *1+ Nml N/A  Left RectFemoris Nml Nml Nml *1- *1+ *1+ *1+ Nml N/A  Left GluteusMed Nml Nml Nml Nml Nml Nml Nml Nml N/A  Left AdductorLong Nml Nml Nml *1- *1+ *1+ *1+ Nml N/A      Waveforms:

## 2022-10-22 NOTE — Progress Notes (Signed)
Nerve conduction study shows that you do have multiple nerves that are pinched in your back.  This is the left L3, left L4, and left S1 nerve roots.  This would explain all of the pain that you are experiencing and typically will be treated with back injections.  I think there may be some difficulty telling how well the injections worked because you have 3 different levels that are infected.  Recommend return to clinic to go over the results in full detail and talk about what we are going to do next.Benjamin Davila

## 2022-10-26 NOTE — Progress Notes (Unsigned)
   Rubin Payor, PhD, LAT, ATC acting as a scribe for Clementeen Graham, MD.  Benjamin Davila is a 82 y.o. male who presents to [facility] at [facility] today for f/u lumbar radiculopathy into his L   Pertinent review of systems: ***  Relevant historical information: ***   Exam:  There were no vitals taken for this visit. General: Well Developed, well nourished, and in no acute distress.   MSK: ***    Lab and Radiology Results No results found for this or any previous visit (from the past 72 hour(s)). No results found.     Assessment and Plan: 82 y.o. male with ***   PDMP not reviewed this encounter. No orders of the defined types were placed in this encounter.  No orders of the defined types were placed in this encounter.    Discussed warning signs or symptoms. Please see discharge instructions. Patient expresses understanding.   ***

## 2022-10-29 ENCOUNTER — Encounter: Payer: Self-pay | Admitting: Family Medicine

## 2022-10-29 ENCOUNTER — Ambulatory Visit (INDEPENDENT_AMBULATORY_CARE_PROVIDER_SITE_OTHER): Payer: Medicare Other | Admitting: Family Medicine

## 2022-10-29 VITALS — BP 142/84 | HR 86 | Ht 70.0 in | Wt 185.0 lb

## 2022-10-29 DIAGNOSIS — M5416 Radiculopathy, lumbar region: Secondary | ICD-10-CM | POA: Diagnosis not present

## 2022-10-29 NOTE — Patient Instructions (Addendum)
Thank you for coming in today.   The Pain Management Workbook: Powerful CBT and Mindfulness Skills to Take Control of Pain and Reclaim Your Life https://www.zoffness.com/  I've referred you to Neurosurgery for a consultation.  Let us know if you don't hear from them in one week.

## 2022-10-31 MED ORDER — DIAZEPAM 5 MG PO TABS
5.0000 mg | ORAL_TABLET | Freq: Every evening | ORAL | 1 refills | Status: DC | PRN
Start: 2022-10-31 — End: 2024-04-09

## 2022-10-31 NOTE — Addendum Note (Signed)
Addended by: Rodolph Bong on: 10/31/2022 11:00 AM   Modules accepted: Orders

## 2022-12-03 ENCOUNTER — Other Ambulatory Visit: Payer: Self-pay | Admitting: Neurological Surgery

## 2022-12-17 NOTE — Therapy (Signed)
Opened note/addendum in error- LC

## 2022-12-19 NOTE — Pre-Procedure Instructions (Signed)
Surgical Instructions   Your procedure is scheduled on Monday, August 12th. Report to [facility] Main Entrance "A" at 11:10 A.M., then check in with the Admitting office. Any questions or running late day of surgery: call [phone]  Questions prior to your surgery date: call [phone], Monday-Friday, 8am-4pm. If you experience any cold or flu symptoms such as cough, fever, chills, shortness of breath, etc. between now and your scheduled surgery, please notify us at the above number.     Remember:  Do not eat or drink after midnight the night before your surgery     Take these medicines the morning of surgery with A SIP OF WATER  atorvastatin (LIPITOR)    May take these medicines IF NEEDED: acetaminophen (TYLENOL)  pantoprazole (PROTONIX)  tamsulosin (FLOMAX)   One week prior to surgery, STOP taking any Aspirin (unless otherwise instructed by your surgeon) Aleve, Naproxen, Ibuprofen, Motrin, Advil, Goody's, BC's, all herbal medications, fish oil, and non-prescription vitamins.  WHAT DO I DO ABOUT MY DIABETES MEDICATION?   Do not take metFORMIN (GLUCOPHAGE) the morning of surgery.  THE NIGHT BEFORE SURGERY, take ___________ units of ___________insulin.       HOW TO MANAGE YOUR DIABETES BEFORE AND AFTER SURGERY  Why is it important to control my blood sugar before and after surgery? Improving blood sugar levels before and after surgery helps healing and can limit problems. A way of improving blood sugar control is eating a healthy diet by:  Eating less sugar and carbohydrates  Increasing activity/exercise  Talking with your doctor about reaching your blood sugar goals High blood sugars (greater than 180 mg/dL) can raise your risk of infections and slow your recovery, so you will need to focus on controlling your diabetes during the weeks before surgery. Make sure that the doctor who takes care of your diabetes knows about your planned surgery including the date and  location.  How do I manage my blood sugar before surgery? Check your blood sugar at least 4 times a day, starting 2 days before surgery, to make sure that the level is not too high or low.  Check your blood sugar the morning of your surgery when you wake up and every 2 hours until you get to the Short Stay unit.  If your blood sugar is less than 70 mg/dL, you will need to treat for low blood sugar: Do not take insulin. Treat a low blood sugar (less than 70 mg/dL) with  cup of clear juice (cranberry or apple), 4 glucose tablets, OR glucose gel. Recheck blood sugar in 15 minutes after treatment (to make sure it is greater than 70 mg/dL). If your blood sugar is not greater than 70 mg/dL on recheck, call [phone] for further instructions. Report your blood sugar to the short stay nurse when you get to Short Stay.  If you are admitted to the hospital after surgery: Your blood sugar will be checked by the staff and you will probably be given insulin after surgery (instead of oral diabetes medicines) to make sure you have good blood sugar levels. The goal for blood sugar control after surgery is 80-180 mg/dL.                     Do NOT Smoke (Tobacco/Vaping) for 24 hours prior to your procedure.  If you use a CPAP at night, you may bring your mask/headgear for your overnight stay.   You will be asked to remove any contacts, glasses, piercing's, hearing aid's,  dentures/partials prior to surgery. Please bring cases for these items if needed.    Patients discharged the day of surgery will not be allowed to drive home, and someone needs to stay with them for 24 hours.  SURGICAL WAITING ROOM VISITATION Patients may have no more than 2 support people in the waiting area - these visitors may rotate.   Pre-op nurse will coordinate an appropriate time for 1 ADULT support person, who may not rotate, to accompany patient in pre-op.  Children under the age of 6 must have an adult with them who is not  the patient and must remain in the main waiting area with an adult.  If the patient needs to stay at the hospital during part of their recovery, the visitor guidelines for inpatient rooms apply.  Please refer to the [facility] website for the visitor guidelines for any additional information.   If you received a COVID test during your pre-op visit  it is requested that you wear a mask when out in public, stay away from anyone that may not be feeling well and notify your surgeon if you develop symptoms. If you have been in contact with anyone that has tested positive in the last 10 days please notify you surgeon.      Pre-operative 5 CHG Bathing Instructions   You can play a key role in reducing the risk of infection after surgery. Your skin needs to be as free of germs as possible. You can reduce the number of germs on your skin by washing with CHG (chlorhexidine gluconate) soap before surgery. CHG is an antiseptic soap that kills germs and continues to kill germs even after washing.   DO NOT use if you have an allergy to chlorhexidine/CHG or antibacterial soaps. If your skin becomes reddened or irritated, stop using the CHG and notify one of our RNs at [phone].   Please shower with the CHG soap starting 4 days before surgery using the following schedule:     Please keep in mind the following:  DO NOT shave, including legs and underarms, starting the day of your first shower.   You may shave your face at any point before/day of surgery.  Place clean sheets on your bed the day you start using CHG soap. Use a clean washcloth (not used since being washed) for each shower. DO NOT sleep with pets once you start using the CHG.   CHG Shower Instructions:  If you choose to wash your hair and private area, wash first with your normal shampoo/soap.  After you use shampoo/soap, rinse your hair and body thoroughly to remove shampoo/soap residue.  Turn the water OFF and apply about 3  tablespoons (45 ml) of CHG soap to a CLEAN washcloth.  Apply CHG soap ONLY FROM YOUR NECK DOWN TO YOUR TOES (washing for 3-5 minutes)  DO NOT use CHG soap on face, private areas, open wounds, or sores.  Pay special attention to the area where your surgery is being performed.  If you are having back surgery, having someone wash your back for you may be helpful. Wait 2 minutes after CHG soap is applied, then you may rinse off the CHG soap.  Pat dry with a clean towel  Put on clean clothes/pajamas   If you choose to wear lotion, please use ONLY the CHG-compatible lotions on the back of this paper.   Additional instructions for the day of surgery: DO NOT APPLY any lotions, deodorants, cologne, or perfumes.   Do  not bring valuables to the hospital. [facility] is not responsible for any belongings/valuables. Do not wear nail polish, gel polish, artificial nails, or any other type of covering on natural nails (fingers and toes) Do not wear jewelry or makeup Put on clean/comfortable clothes.  Please brush your teeth.  Ask your nurse before applying any prescription medications to the skin.     CHG Compatible Lotions   Aveeno Moisturizing lotion  Cetaphil Moisturizing Cream  Cetaphil Moisturizing Lotion  Clairol Herbal Essence Moisturizing Lotion, Dry Skin  Clairol Herbal Essence Moisturizing Lotion, Extra Dry Skin  Clairol Herbal Essence Moisturizing Lotion, Normal Skin  Curel Age Defying Therapeutic Moisturizing Lotion with Alpha Hydroxy  Curel Extreme Care Body Lotion  Curel Soothing Hands Moisturizing Hand Lotion  Curel Therapeutic Moisturizing Cream, Fragrance-Free  Curel Therapeutic Moisturizing Lotion, Fragrance-Free  Curel Therapeutic Moisturizing Lotion, Original Formula  Eucerin Daily Replenishing Lotion  Eucerin Dry Skin Therapy Plus Alpha Hydroxy Crme  Eucerin Dry Skin Therapy Plus Alpha Hydroxy Lotion  Eucerin Original Crme  Eucerin Original Lotion  Eucerin Plus Crme  Eucerin Plus Lotion  Eucerin TriLipid Replenishing Lotion  Keri Anti-Bacterial Hand Lotion  Keri Deep Conditioning Original Lotion Dry Skin Formula Softly Scented  Keri Deep Conditioning Original Lotion, Fragrance Free Sensitive Skin Formula  Keri Lotion Fast Absorbing Fragrance Free Sensitive Skin Formula  Keri Lotion Fast Absorbing Softly Scented Dry Skin Formula  Keri Original Lotion  Keri Skin Renewal Lotion Keri Silky Smooth Lotion  Keri Silky Smooth Sensitive Skin Lotion  Nivea Body Creamy Conditioning Oil  Nivea Body Extra Enriched Lotion  Nivea Body Original Lotion  Nivea Body Sheer Moisturizing Lotion Nivea Crme  Nivea Skin Firming Lotion  NutraDerm 30 Skin Lotion  NutraDerm Skin Lotion  NutraDerm Therapeutic Skin Cream  NutraDerm Therapeutic Skin Lotion  ProShield Protective Hand Cream  Provon moisturizing lotion  Please read over the following fact sheets that you were given.

## 2022-12-20 ENCOUNTER — Encounter (HOSPITAL_COMMUNITY): Payer: Self-pay

## 2022-12-20 ENCOUNTER — Other Ambulatory Visit: Payer: Self-pay

## 2022-12-20 ENCOUNTER — Encounter (HOSPITAL_COMMUNITY)
Admission: RE | Admit: 2022-12-20 | Discharge: 2022-12-20 | Disposition: A | Discharge status: Home / Self Care | Payer: Medicare Other | Source: Ambulatory Visit | Attending: Neurological Surgery | Admitting: Neurological Surgery

## 2022-12-20 VITALS — BP 166/94 | HR 89 | Temp 98.0°F | Resp 18 | Ht 68.0 in | Wt 188.9 lb

## 2022-12-20 DIAGNOSIS — Z01818 Encounter for other preprocedural examination: Secondary | ICD-10-CM | POA: Insufficient documentation

## 2022-12-20 DIAGNOSIS — E119 Type 2 diabetes mellitus without complications: Secondary | ICD-10-CM | POA: Diagnosis not present

## 2022-12-20 DIAGNOSIS — Z794 Long term (current) use of insulin: Secondary | ICD-10-CM | POA: Diagnosis not present

## 2022-12-20 LAB — COMPREHENSIVE METABOLIC PANEL
ALT: 31 U/L (ref 0–44)
AST: 30 U/L (ref 15–41)
Albumin: 4.1 g/dL (ref 3.5–5.0)
Alkaline Phosphatase: 77 U/L (ref 38–126)
Anion gap: 9 (ref 5–15)
BUN: 12 mg/dL (ref 8–23)
CO2: 26 mmol/L (ref 22–32)
Calcium: 9.5 mg/dL (ref 8.9–10.3)
Chloride: 105 mmol/L (ref 98–111)
Creatinine, Ser: 0.84 mg/dL (ref 0.61–1.24)
GFR, Estimated: >60 mL/min (ref >60)
Glucose, Bld: 150 mg/dL — ABNORMAL HIGH (ref 70–99)
Potassium: 3.9 mmol/L (ref 3.5–5.1)
Sodium: 140 mmol/L (ref 135–145)
Total Bilirubin: 0.8 mg/dL (ref 0.3–1.2)
Total Protein: 7.3 g/dL (ref 6.5–8.1)

## 2022-12-20 LAB — CBC
HCT: 36.7 % — ABNORMAL LOW (ref 39.0–52.0)
Hemoglobin: 12 g/dL — ABNORMAL LOW (ref 13.0–17.0)
MCH: 30.8 pg (ref 26.0–34.0)
MCHC: 32.7 g/dL (ref 30.0–36.0)
MCV: 94.1 fL (ref 80.0–100.0)
Platelets: 178 10*3/uL (ref 150–400)
RBC: 3.9 MIL/uL — ABNORMAL LOW (ref 4.22–5.81)
RDW: 12.3 % (ref 11.5–15.5)
WBC: 4.8 10*3/uL (ref 4.0–10.5)
nRBC: 0 % (ref 0.0–0.2)

## 2022-12-20 LAB — TYPE AND SCREEN
ABO/RH(D): A POS
Antibody Screen: NEGATIVE

## 2022-12-20 LAB — GLUCOSE, CAPILLARY: Glucose-Capillary: 153 mg/dL — ABNORMAL HIGH (ref 70–99)

## 2022-12-20 LAB — HEMOGLOBIN A1C
Hgb A1c MFr Bld: 6.6 % — ABNORMAL HIGH (ref 4.8–5.6)
Mean Plasma Glucose: 142.72 mg/dL

## 2022-12-20 LAB — SURGICAL PCR SCREEN
MRSA, PCR: NEGATIVE
Staphylococcus aureus: POSITIVE — AB

## 2022-12-20 LAB — PROTIME-INR
INR: 1.1 (ref 0.8–1.2)
Prothrombin Time: 14.7 seconds (ref 11.4–15.2)

## 2022-12-20 NOTE — Progress Notes (Addendum)
PCP - Dr. Meridee Score at [facility] Cardiologist - Dr. Jodene Nam  PPM/ICD - n/a Device Orders - n/a Rep Notified - n/a  Chest x-ray - n/a EKG - 12/20/22 Stress Test - Many years ago per patient. Normal per patient ECHO - 07/23/20 Cardiac Cath - denies  Sleep Study - denies CPAP- n/a    CBG 153 at PAT appointment Checks Blood Sugar every 3 weeks Range- 120  Last dose of GLP1 agonist-  n/a GLP1 instructions: n/a  Blood Thinner Instructions: n/a Aspirin Instructions: n/a  ERAS Protcol - NPO   COVID TEST- n/a   Anesthesia review: Yes. History of heart murmur. Hetty Ely, PA made aware patient has a history of heart murmur. Last echo 2022. Per Antionette Poles, PA patient needs to reach out to Dr. Tomie China because he's significantly overdue for cardiology followup. Moderate aortic stenosis should be followed up with echo about every 2 years per Hetty Ely, PA. Per Fayrene Fearing, encouraged patient to reach out to Dr. Kem Parkinson office today to let them know he has surgery on 12th and he needs a repeat echo to proceed. Patient and his wife verbalized understanding.   Patient denies shortness of breath, fever, cough and chest pain at PAT appointment   All instructions explained to the patient, with a verbal understanding of the material. Patient agrees to go over the instructions while at home for a better understanding. Patient also instructed to self quarantine after being tested for COVID-19. The opportunity to ask questions was provided.

## 2022-12-20 NOTE — Progress Notes (Addendum)
Attempted x 2 to reach pharmacy tech for medication reconciliation. No one showed up or responded to secure chat. Medications confirmed with the patient by RN. Patient given phone number to contact med tech.

## 2022-12-20 NOTE — Pre-Procedure Instructions (Signed)
Surgical Instructions   Your procedure is scheduled on Monday, August 12th. Report to [facility] Main Entrance "A" at 11:10 A.M., then check in with the Admitting office. Any questions or running late day of surgery: call [phone]  Questions prior to your surgery date: call [phone], Monday-Friday, 8am-4pm. If you experience any cold or flu symptoms such as cough, fever, chills, shortness of breath, etc. between now and your scheduled surgery, please notify us at the above number.     Remember:  Do not eat or drink after midnight the night before your surgery     Take these medicines the morning of surgery with A SIP OF WATER  atorvastatin (LIPITOR)    May take these medicines IF NEEDED: acetaminophen (TYLENOL)  pantoprazole (PROTONIX)  tamsulosin (FLOMAX)   One week prior to surgery, STOP taking any Aspirin (unless otherwise instructed by your surgeon) Aleve, Naproxen, Ibuprofen, Motrin, Advil, Goody's, BC's, all herbal medications, fish oil, and non-prescription vitamins.  WHAT DO I DO ABOUT MY DIABETES MEDICATION?   Do not take metFORMIN (GLUCOPHAGE) the morning of surgery.  THE MORNING OF SURGERY, take 16 units of insulin glargine (LANTUS SOLOSTAR).  This is half of your normal dose.      HOW TO MANAGE YOUR DIABETES BEFORE AND AFTER SURGERY  Why is it important to control my blood sugar before and after surgery? Improving blood sugar levels before and after surgery helps healing and can limit problems. A way of improving blood sugar control is eating a healthy diet by:  Eating less sugar and carbohydrates  Increasing activity/exercise  Talking with your doctor about reaching your blood sugar goals High blood sugars (greater than 180 mg/dL) can raise your risk of infections and slow your recovery, so you will need to focus on controlling your diabetes during the weeks before surgery. Make sure that the doctor who takes care of your diabetes knows about your  planned surgery including the date and location.  How do I manage my blood sugar before surgery? Check your blood sugar at least 4 times a day, starting 2 days before surgery, to make sure that the level is not too high or low.  Check your blood sugar the morning of your surgery when you wake up and every 2 hours until you get to the Short Stay unit.  If your blood sugar is less than 70 mg/dL, you will need to treat for low blood sugar: Do not take insulin. Treat a low blood sugar (less than 70 mg/dL) with  cup of clear juice (cranberry or apple), 4 glucose tablets, OR glucose gel. Recheck blood sugar in 15 minutes after treatment (to make sure it is greater than 70 mg/dL). If your blood sugar is not greater than 70 mg/dL on recheck, call [phone] for further instructions. Report your blood sugar to the short stay nurse when you get to Short Stay.  If you are admitted to the hospital after surgery: Your blood sugar will be checked by the staff and you will probably be given insulin after surgery (instead of oral diabetes medicines) to make sure you have good blood sugar levels. The goal for blood sugar control after surgery is 80-180 mg/dL.                     Do NOT Smoke (Tobacco/Vaping) for 24 hours prior to your procedure.  If you use a CPAP at night, you may bring your mask/headgear for your overnight stay.   You will  be asked to remove any contacts, glasses, piercing's, hearing aid's, dentures/partials prior to surgery. Please bring cases for these items if needed.    Patients discharged the day of surgery will not be allowed to drive home, and someone needs to stay with them for 24 hours.  SURGICAL WAITING ROOM VISITATION Patients may have no more than 2 support people in the waiting area - these visitors may rotate.   Pre-op nurse will coordinate an appropriate time for 1 ADULT support person, who may not rotate, to accompany patient in pre-op.  Children under the age of 62  must have an adult with them who is not the patient and must remain in the main waiting area with an adult.  If the patient needs to stay at the hospital during part of their recovery, the visitor guidelines for inpatient rooms apply.  Please refer to the [facility] website for the visitor guidelines for any additional information.   If you received a COVID test during your pre-op visit  it is requested that you wear a mask when out in public, stay away from anyone that may not be feeling well and notify your surgeon if you develop symptoms. If you have been in contact with anyone that has tested positive in the last 10 days please notify you surgeon.      Pre-operative 5 CHG Bathing Instructions   You can play a key role in reducing the risk of infection after surgery. Your skin needs to be as free of germs as possible. You can reduce the number of germs on your skin by washing with CHG (chlorhexidine gluconate) soap before surgery. CHG is an antiseptic soap that kills germs and continues to kill germs even after washing.   DO NOT use if you have an allergy to chlorhexidine/CHG or antibacterial soaps. If your skin becomes reddened or irritated, stop using the CHG and notify one of our RNs at [phone].   Please shower with the CHG soap starting 4 days before surgery using the following schedule:     Please keep in mind the following:  DO NOT shave, including legs and underarms, starting the day of your first shower.   You may shave your face at any point before/day of surgery.  Place clean sheets on your bed the day you start using CHG soap. Use a clean washcloth (not used since being washed) for each shower. DO NOT sleep with pets once you start using the CHG.   CHG Shower Instructions:  If you choose to wash your hair and private area, wash first with your normal shampoo/soap.  After you use shampoo/soap, rinse your hair and body thoroughly to remove shampoo/soap residue.  Turn  the water OFF and apply about 3 tablespoons (45 ml) of CHG soap to a CLEAN washcloth.  Apply CHG soap ONLY FROM YOUR NECK DOWN TO YOUR TOES (washing for 3-5 minutes)  DO NOT use CHG soap on face, private areas, open wounds, or sores.  Pay special attention to the area where your surgery is being performed.  If you are having back surgery, having someone wash your back for you may be helpful. Wait 2 minutes after CHG soap is applied, then you may rinse off the CHG soap.  Pat dry with a clean towel  Put on clean clothes/pajamas   If you choose to wear lotion, please use ONLY the CHG-compatible lotions on the back of this paper.   Additional instructions for the day of surgery: DO NOT  APPLY any lotions, deodorants, cologne, or perfumes.   Do not bring valuables to the hospital. [facility] is not responsible for any belongings/valuables. Do not wear nail polish, gel polish, artificial nails, or any other type of covering on natural nails (fingers and toes) Do not wear jewelry or makeup Put on clean/comfortable clothes.  Please brush your teeth.  Ask your nurse before applying any prescription medications to the skin.     CHG Compatible Lotions   Aveeno Moisturizing lotion  Cetaphil Moisturizing Cream  Cetaphil Moisturizing Lotion  Clairol Herbal Essence Moisturizing Lotion, Dry Skin  Clairol Herbal Essence Moisturizing Lotion, Extra Dry Skin  Clairol Herbal Essence Moisturizing Lotion, Normal Skin  Curel Age Defying Therapeutic Moisturizing Lotion with Alpha Hydroxy  Curel Extreme Care Body Lotion  Curel Soothing Hands Moisturizing Hand Lotion  Curel Therapeutic Moisturizing Cream, Fragrance-Free  Curel Therapeutic Moisturizing Lotion, Fragrance-Free  Curel Therapeutic Moisturizing Lotion, Original Formula  Eucerin Daily Replenishing Lotion  Eucerin Dry Skin Therapy Plus Alpha Hydroxy Crme  Eucerin Dry Skin Therapy Plus Alpha Hydroxy Lotion  Eucerin Original Crme  Eucerin  Original Lotion  Eucerin Plus Crme Eucerin Plus Lotion  Eucerin TriLipid Replenishing Lotion  Keri Anti-Bacterial Hand Lotion  Keri Deep Conditioning Original Lotion Dry Skin Formula Softly Scented  Keri Deep Conditioning Original Lotion, Fragrance Free Sensitive Skin Formula  Keri Lotion Fast Absorbing Fragrance Free Sensitive Skin Formula  Keri Lotion Fast Absorbing Softly Scented Dry Skin Formula  Keri Original Lotion  Keri Skin Renewal Lotion Keri Silky Smooth Lotion  Keri Silky Smooth Sensitive Skin Lotion  Nivea Body Creamy Conditioning Oil  Nivea Body Extra Enriched Lotion  Nivea Body Original Lotion  Nivea Body Sheer Moisturizing Lotion Nivea Crme  Nivea Skin Firming Lotion  NutraDerm 30 Skin Lotion  NutraDerm Skin Lotion  NutraDerm Therapeutic Skin Cream  NutraDerm Therapeutic Skin Lotion  ProShield Protective Hand Cream  Provon moisturizing lotion  Please read over the following fact sheets that you were given.

## 2022-12-21 ENCOUNTER — Telehealth: Payer: Self-pay

## 2022-12-21 ENCOUNTER — Encounter (HOSPITAL_COMMUNITY): Payer: Self-pay | Admitting: Physician Assistant

## 2022-12-21 NOTE — Telephone Encounter (Signed)
Pts last OV 07/14/20. Pt will need in office visit fro preop clearnce.      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

## 2022-12-21 NOTE — Telephone Encounter (Signed)
Pts last OV was 07/14/20. Pt will need an in office visit for preop clearance. The only appt found before procedure date is 8/9 at 8:50am with Edd Fabian, NP. I scheduled appt and left a vm for pt to return casll if this was not acceptable. I will forward to Jess.

## 2022-12-26 ENCOUNTER — Other Ambulatory Visit: Payer: Self-pay

## 2022-12-26 DIAGNOSIS — T819XXA Unspecified complication of procedure, initial encounter: Secondary | ICD-10-CM | POA: Insufficient documentation

## 2022-12-26 DIAGNOSIS — R112 Nausea with vomiting, unspecified: Secondary | ICD-10-CM | POA: Insufficient documentation

## 2022-12-26 DIAGNOSIS — R1032 Left lower quadrant pain: Secondary | ICD-10-CM | POA: Insufficient documentation

## 2022-12-26 DIAGNOSIS — Z9889 Other specified postprocedural states: Secondary | ICD-10-CM | POA: Insufficient documentation

## 2022-12-26 HISTORY — DX: Left lower quadrant pain: R10.32

## 2022-12-26 HISTORY — DX: Unspecified complication of procedure, initial encounter: T81.9XXA

## 2022-12-27 ENCOUNTER — Ambulatory Visit: Payer: Medicare Other | Admitting: Cardiology

## 2022-12-28 ENCOUNTER — Ambulatory Visit: Payer: Medicare Other | Admitting: General Practice

## 2022-12-31 ENCOUNTER — Inpatient Hospital Stay (HOSPITAL_COMMUNITY): Admission: RE | Admit: 2022-12-31 | Payer: Medicare Other | Source: Ambulatory Visit | Admitting: Neurological Surgery

## 2022-12-31 SURGERY — LAMINECTOMY WITH POSTERIOR LATERAL ARTHRODESIS LEVEL 2
Anesthesia: General | Site: Back | Laterality: Left

## 2023-01-16 ENCOUNTER — Ambulatory Visit: Payer: Medicare Other | Attending: Cardiology | Admitting: Cardiology

## 2023-01-16 ENCOUNTER — Encounter: Payer: Self-pay | Admitting: Cardiology

## 2023-01-16 VITALS — BP 148/78 | HR 90 | Ht 62.0 in | Wt 182.1 lb

## 2023-01-16 DIAGNOSIS — I10 Essential (primary) hypertension: Secondary | ICD-10-CM

## 2023-01-16 DIAGNOSIS — I350 Nonrheumatic aortic (valve) stenosis: Secondary | ICD-10-CM

## 2023-01-16 DIAGNOSIS — Z01810 Encounter for preprocedural cardiovascular examination: Secondary | ICD-10-CM | POA: Diagnosis not present

## 2023-01-16 DIAGNOSIS — E088 Diabetes mellitus due to underlying condition with unspecified complications: Secondary | ICD-10-CM

## 2023-01-16 DIAGNOSIS — I351 Nonrheumatic aortic (valve) insufficiency: Secondary | ICD-10-CM

## 2023-01-16 MED ORDER — METOPROLOL TARTRATE 100 MG PO TABS: 100.0000 mg | ORAL_TABLET | Freq: Once | ORAL | 0 refills | Status: DC

## 2023-01-16 NOTE — Progress Notes (Signed)
Cardiology Office Note:    Date:  01/16/2023   ID:  Bolivar, Figura [birth year], MRN [id number]  PCP:  Soundra Pilon, FNP  Cardiologist:  Garwin Brothers, MD   Referring MD: Soundra Pilon, FNP    ASSESSMENT:    1. Preop cardiovascular exam   2. Aortic stenosis, moderate   3. Aortic valve insufficiency, etiology of cardiac valve disease unspecified   4. Essential hypertension   5. Diabetes mellitus due to underlying condition with unspecified complications (HCC)    PLAN:    In order of problems listed above:  Coronary artery disease and preoperative cardiovascular evaluation: Secondary prevention stressed to the patient.  Importance of compliance with diet medications stressed.  In view of his significant surgery will need evaluation for coronary artery disease.  He has multiple risk factors.  I prefer he gets a CT coronary angiography.  He is agreeable for this.  I prefer not to get him into hypotension with medication such as Lexiscan. Moderate aortic stenosis and aortic regurgitation: Echocardiogram will be done to assess this. If the above are stable then I will give her follow-up assessment note and addendum to this note. Essential hypertension: Blood pressure stable and diet was emphasized. Mixed dyslipidemia: Lipid-lowering medications followed by primary care. Patient will be seen in follow-up appointment in 6 months or earlier if the patient has any concerns.   Medication Adjustments/Labs and Tests Ordered: Current medicines are reviewed at length with the patient today.  Concerns regarding medicines are outlined above.  No orders of the defined types were placed in this encounter.  No orders of the defined types were placed in this encounter.    No chief complaint on file.    History of Present Illness:    Benjamin Davila is a 82 y.o. male.  Patient has past medical history of moderate aortic stenosis and regurgitation, essential hypertension and mixed  dyslipidemia.  He denies any problems at this time.  He is planning to undergo spinal fusion surgery and this was postponed because of significant cardiovascular issues.  At the time of my evaluation, the patient is alert awake oriented and in no distress.  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   History of pancreatitis 07/12/2020   History of pancytopenia 07/12/2020   Hyperlipidemia, unspecified 07/12/2020   Irregular heart beat 07/12/2020   Left inguinal pain 12/26/2022   Long term (current) use of insulin (HCC) 07/12/2020   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   Lumbar radiculopathy 06/17/2020   Lumbar spondylosis 07/28/2020   Malignant neoplasm of prostate (HCC) 06/24/2019   Mood disorder (HCC) 07/12/2020   Pain of left hip joint 08/25/2019   PONV (postoperative nausea and vomiting)    Prostate cancer (HCC)    S/P lumbar laminectomy 08/09/2021   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   Systolic murmur 07/12/2020   Type 2 diabetes mellitus with other specified complication (HCC) 07/12/2020    Past Surgical History:  Procedure Laterality Date   BACK SURGERY     lumbar surgery 20 years ago per patient   CATARACT EXTRACTION     CHOLECYSTECTOMY  20 years ago per patient   COLONOSCOPY     LUMBAR LAMINECTOMY/DECOMPRESSION MICRODISCECTOMY Bilateral 08/09/2021   Procedure: Lumbar decompressive laminectomy  - Lumbar two-Lumabr three - Lumbar three-Lumbar four - Lumbar four-Lumbar five - Lumabr five-Sacal one - Bilateral, Posterior Lateral Fusion  Lumbar three-four-Right;  Surgeon: Tia Alert, MD;  Location: [facility] OR;  Service: Neurosurgery;  Laterality: Bilateral;   TONSILLECTOMY     as a kid    Current Medications: Current Meds  Medication Sig   atorvastatin (LIPITOR) 20 MG tablet Take 20 mg by mouth every morning.   CALCIUM PO Take 1 tablet by mouth every morning.   diazepam (VALIUM) 5 MG tablet Take 1 tablet (5 mg total) by mouth at bedtime as needed for anxiety (moods,and muscle spasm).   ferrous sulfate 325 (65 FE) MG tablet Take 325 mg by mouth in the morning, at noon, and at bedtime.   insulin glargine (LANTUS SOLOSTAR) 100 UNIT/ML Solostar Pen Inject 32 Units into the skin daily before breakfast.   Krill Oil 500 MG CAPS Take 1,000 mg by mouth every morning.   losartan (COZAAR) 100 MG tablet Take 100 mg by mouth daily.   metFORMIN (GLUCOPHAGE) 500 MG tablet Take 1,000 mg by mouth 2 (two) times a day.   Multiple Vitamin (MULTIVITAMIN WITH MINERALS) TABS tablet Take 2 tablets by mouth every morning.   tamsulosin (FLOMAX) 0.4 MG CAPS capsule Take 0.4 mg by mouth daily as needed (infrequent urination).   Testosterone 1.62 % GEL Apply 40.5 mg topically every morning.     Allergies:   Lisinopril   Social History   Socioeconomic History   Marital status: Married    Spouse name: Not on file   Number of children: Not on file   Years of education: Not on file   Highest education level: Not on file  Occupational History   Not on file  Tobacco Use   Smoking status: Never   Smokeless tobacco: Never  Vaping Use   Vaping status: Never Used  Substance and Sexual Activity   Alcohol use: Not Currently   Drug use: Never   Sexual activity: Not on file  Other Topics Concern   Not on file  Social History Narrative   Not on file   Social Determinants of Health   Financial Resource Strain: Not on file  Food Insecurity: Not on file  Transportation Needs: Not on file  Physical Activity: Not on file  Stress: Not on file   Social Connections: Not on file     Family History: The patient's family history includes Lupus in his mother.  ROS:   Please see the history of present illness.    All other systems reviewed and are negative.  EKGs/Labs/Other Studies Reviewed:    The following studies were reviewed today: I discussed my findings with the patient at length   Recent Labs: 12/20/2022: ALT 31; BUN 12; Creatinine, Ser 0.84; Hemoglobin 12.0; Platelets 178; Potassium 3.9; Sodium 140  Recent Lipid Panel No results found for: "CHOL", "TRIG", "HDL", "CHOLHDL", "VLDL", "LDLCALC", "LDLDIRECT"  Physical Exam:    VS:  BP (!) 148/78   Pulse 90   Ht 5\' 2"  (1.575 m)   Wt 182 lb 1.3 oz (82.6 kg)   SpO2 93%   BMI 33.30 kg/m     Wt Readings from Last 3 Encounters:  01/16/23 182 lb 1.3 oz (82.6 kg)  12/20/22 188 lb 14.4 oz (85.7 kg)  10/29/22 185 lb (83.9 kg)  GEN: Patient is in no acute distress HEENT: Normal NECK: No JVD; No carotid bruits LYMPHATICS: No lymphadenopathy CARDIAC: Hear sounds regular, 2/6 systolic murmur at the apex and at the aortic area.. RESPIRATORY:  Clear to auscultation without rales, wheezing or rhonchi  ABDOMEN: Soft, non-tender, non-distended MUSCULOSKELETAL:  No edema; No deformity  SKIN: Warm and dry NEUROLOGIC:  Alert and oriented x 3 PSYCHIATRIC:  Normal affect   Signed, Garwin Brothers, MD  01/16/2023 2:22 PM    [facility]

## 2023-01-16 NOTE — Patient Instructions (Signed)
Medication Instructions:  Your physician recommends that you continue on your current medications as directed. Please refer to the Current Medication list given to you today.   *If you need a refill on your cardiac medications before your next appointment, please call your pharmacy*   Lab Work: None ordered  If you have labs (blood work) drawn today and your tests are completely normal, you will receive your results only by: MyChart Message (if you have MyChart) OR A paper copy in the mail If you have any lab test that is abnormal or we need to change your treatment, we will call you to review the results.   Testing/Procedures:   Your cardiac CT will be scheduled at one of the below locations:   [facility] [address] [place], [place] [address] [phone]  If scheduled at [facility], please arrive at the [facility] and Children's Entrance (Entrance C2) of [facility] 30 minutes prior to test start time. You can use the FREE valet parking offered at entrance C (encouraged to control the heart rate for the test)  Proceed to the [facility] Radiology Department (first floor) to check-in and test prep.  All radiology patients and guests should use entrance C2 at [facility], accessed from [facility], even though the hospital's physical address listed is [address]     Please follow these instructions carefully (unless otherwise directed):  Hold all erectile dysfunction medications at least 3 days (72 hrs) prior to test. (Ie viagra, cialis, sildenafil, tadalafil, etc) We will administer nitroglycerin during this exam.   On the Night Before the Test: Be sure to Drink plenty of water. Do not consume any caffeinated/decaffeinated beverages or chocolate 12 hours prior to your test. Do not take any antihistamines 12 hours prior to your test.   On the Day of the Test: Drink plenty of water until 1 hour prior to the  test. Do not eat any food 1 hour prior to test. You may take your regular medications prior to the test.  Take metoprolol (Lopressor) two hours prior to test. This is a one time dose.  After the Test: Drink plenty of water. After receiving IV contrast, you may experience a mild flushed feeling. This is normal. On occasion, you may experience a mild rash up to 24 hours after the test. This is not dangerous. If this occurs, you can take Benadryl 25 mg and increase your fluid intake. If you experience trouble breathing, this can be serious. If it is severe call 911 IMMEDIATELY. If it is mild, please call our office. If you take any of these medications: Glipizide/Metformin, Avandament, Glucavance, please do not take 48 hours after completing test unless otherwise instructed.  We will call to schedule your test 2-4 weeks out understanding that some insurance companies will need an authorization prior to the service being performed.   For non-scheduling related questions, please contact the cardiac imaging nurse navigator should you have any questions/concerns: Rockwell Alexandria, Cardiac Imaging Nurse Navigator Larey Brick, Cardiac Imaging Nurse Navigator [facility] Direct Office Dial: [phone]   For scheduling needs, including cancellations and rescheduling, please call [place], [phone].   Your physician has requested that you have an echocardiogram. Echocardiography is a painless test that uses sound waves to create images of your heart. It provides your doctor with information about the size and shape of your heart and how well your heart's chambers and valves are working.  This procedure takes approximately one hour. There are no restrictions for this procedure. Please do NOT wear cologne, perfume, aftershave, or lotions (deodorant is allowed). Please arrive 15 minutes prior to your appointment time.  Your next appointment:   9 month(s)  The format for  your next appointment:   In Person  Provider:   Belva Crome, MD   Other Instructions Cardiac CT Angiogram A cardiac CT angiogram is a procedure to look at the heart and the area around the heart. It may be done to help find the cause of chest pains or other symptoms of heart disease. During this procedure, a substance called contrast dye is injected into the blood vessels in the area to be checked. A large X-ray machine, called a CT scanner, then takes detailed pictures of the heart and the surrounding area. The procedure is also sometimes called a coronary CT angiogram, coronary artery scanning, or CTA. A cardiac CT angiogram allows the health care provider to see how well blood is flowing to and from the heart. The health care provider will be able to see if there are any problems, such as: Blockage or narrowing of the coronary arteries in the heart. Fluid around the heart. Signs of weakness or disease in the muscles, valves, and tissues of the heart. Tell a health care provider about: Any allergies you have. This is especially important if you have had a previous allergic reaction to contrast dye. All medicines you are taking, including vitamins, herbs, eye drops, creams, and over-the-counter medicines. Any blood disorders you have. Any surgeries you have had. Any medical conditions you have. Whether you are pregnant or may be pregnant. Any anxiety disorders, chronic pain, or other conditions you have that may increase your stress or prevent you from lying still. What are the risks? Generally, this is a safe procedure. However, problems may occur, including: Bleeding. Infection. Allergic reactions to medicines or dyes. Damage to other structures or organs. Kidney damage from the contrast dye that is used. Increased risk of cancer from radiation exposure. This risk is low. Talk with your health care provider about: The risks and benefits of testing. How you can receive the lowest  dose of radiation. What happens before the procedure? Wear comfortable clothing and remove any jewelry, glasses, dentures, and hearing aids. Follow instructions from your health care provider about eating and drinking. This may include: For 12 hours before the procedure -- avoid caffeine. This includes tea, coffee, soda, energy drinks, and diet pills. Drink plenty of water or other fluids that do not have caffeine in them. Being well hydrated can prevent complications. For 4-6 hours before the procedure -- stop eating and drinking. The contrast dye can cause nausea, but this is less likely if your stomach is empty. Ask your health care provider about changing or stopping your regular medicines. This is especially important if you are taking diabetes medicines, blood thinners, or medicines to treat problems with erections (erectile dysfunction). What happens during the procedure?  Hair on your chest may need to be removed so that small sticky patches called electrodes can be placed on your chest. These will transmit information that helps to monitor your heart during the procedure. An IV will be inserted into one of your veins. You might be given a medicine to control your heart rate during the procedure. This will help to ensure that good images are obtained. You will be asked to lie on an exam table. This table will slide in and out  of the CT machine during the procedure. Contrast dye will be injected into the IV. You might feel warm, or you may get a metallic taste in your mouth. You will be given a medicine called nitroglycerin. This will relax or dilate the arteries in your heart. The table that you are lying on will move into the CT machine tunnel for the scan. The person running the machine will give you instructions while the scans are being done. You may be asked to: Keep your arms above your head. Hold your breath. Stay very still, even if the table is moving. When the scanning is  complete, you will be moved out of the machine. The IV will be removed. The procedure may vary among health care providers and hospitals. What can I expect after the procedure? After your procedure, it is common to have: A metallic taste in your mouth from the contrast dye. A feeling of warmth. A headache from the nitroglycerin. Follow these instructions at home: Take over-the-counter and prescription medicines only as told by your health care provider. If you are told, drink enough fluid to keep your urine pale yellow. This will help to flush the contrast dye out of your body. Most people can return to their normal activities right after the procedure. Ask your health care provider what activities are safe for you. It is up to you to get the results of your procedure. Ask your health care provider, or the department that is doing the procedure, when your results will be ready. Keep all follow-up visits as told by your health care provider. This is important. Contact a health care provider if: You have any symptoms of allergy to the contrast dye. These include: Shortness of breath. Rash or hives. A racing heartbeat. Summary A cardiac CT angiogram is a procedure to look at the heart and the area around the heart. It may be done to help find the cause of chest pains or other symptoms of heart disease. During this procedure, a large X-ray machine, called a CT scanner, takes detailed pictures of the heart and the surrounding area after a contrast dye has been injected into blood vessels in the area. Ask your health care provider about changing or stopping your regular medicines before the procedure. This is especially important if you are taking diabetes medicines, blood thinners, or medicines to treat erectile dysfunction. If you are told, drink enough fluid to keep your urine pale yellow. This will help to flush the contrast dye out of your body. This information is not intended to replace  advice given to you by your health care provider. Make sure you discuss any questions you have with your health care provider. Document Revised: 12/31/2018 Document Reviewed: 12/31/2018 Elsevier Patient Education  [employer].   Echocardiogram An echocardiogram is a test that uses sound waves to make images of your heart. This way of making images is often called ultrasound. The images from this test can help find out many things about your heart, including: The size and shape of your heart. The strength of your heart muscle and how well it's working. The size, thickness, and movement of your heart's walls. How your heart valves are working. Problems such as: A tumor or a growth from an infection around the heart valves. Areas of heart muscle that aren't working well because of poor blood flow or injury from a heart attack. An aneurysm. This is a weak or damaged part of an artery wall. An artery is  a blood vessel. Tell a health care provider about: Any allergies you have. All medicines you're taking, including vitamins, herbs, eye drops, creams, and over-the-counter medicines. Any bleeding problems you have. Any surgeries you've had. Any medical problems you have. Whether you're pregnant or may be pregnant. What are the risks? Your health care provider will talk with you about risks. These may include an allergic reaction to IV dye that may be used during the test. What happens before the test? You don't need to do anything to get ready for this test. You may eat and drink normally. What happens during the test?  You'll take off your clothes from the waist up and put on a hospital gown. Sticky patches called electrodes may be placed on your chest. These will be connected to a machine that monitors your heart rate and rhythm. You'll lie down on a table for the exam. A wand covered in gel will be moved over your chest. Sound waves from the wand will go to your heart and bounce  back--or "echo" back. The sound waves will go to a computer that uses them to make images of your heart. The images can be viewed on a monitor. The images will also be recorded on the computer so your provider can look at them later. You may be asked to change positions or hold your breath for a short time. This makes it easier to get different views or better views of your heart. In some cases, you may be given a dye through an IV. The IV is put into one of your veins. This dye can make the areas of your heart easier to see. The procedure may vary among providers and hospitals. What can I expect after the test? You may return to your normal diet, activities, and medicines unless your provider tells you not to. If an IV was placed for the test, it will be removed. It's up to you to get the results of your test. Ask your provider, or the department that's doing the test, when your results will be ready. This information is not intended to replace advice given to you by your health care provider. Make sure you discuss any questions you have with your health care provider. Document Revised: 07/06/2022 Document Reviewed: 07/06/2022 Elsevier Patient Education  2024 [employer].

## 2023-01-17 LAB — BASIC METABOLIC PANEL
BUN/Creatinine Ratio: 17 (ref 10–24)
BUN: 17 mg/dL (ref 8–27)
CO2: 24 mmol/L (ref 20–29)
Calcium: 10.5 mg/dL — ABNORMAL HIGH (ref 8.6–10.2)
Chloride: 107 mmol/L — ABNORMAL HIGH (ref 96–106)
Creatinine, Ser: 0.99 mg/dL (ref 0.76–1.27)
Glucose: 77 mg/dL (ref 70–99)
Potassium: 5.1 mmol/L (ref 3.5–5.2)
Sodium: 150 mmol/L — ABNORMAL HIGH (ref 134–144)
eGFR: 77 mL/min/{1.73_m2} (ref >59)

## 2023-01-22 ENCOUNTER — Encounter (HOSPITAL_COMMUNITY): Payer: Self-pay

## 2023-01-24 ENCOUNTER — Ambulatory Visit (HOSPITAL_COMMUNITY)
Admission: RE | Admit: 2023-01-24 | Discharge: 2023-01-24 | Disposition: A | Discharge status: Home / Self Care | Payer: Medicare Other | Source: Ambulatory Visit | Attending: Cardiology | Admitting: Cardiology

## 2023-01-24 DIAGNOSIS — I350 Nonrheumatic aortic (valve) stenosis: Secondary | ICD-10-CM | POA: Diagnosis present

## 2023-01-24 DIAGNOSIS — Z01810 Encounter for preprocedural cardiovascular examination: Secondary | ICD-10-CM | POA: Diagnosis present

## 2023-01-24 MED ORDER — IOHEXOL 350 MG/ML SOLN
95.0000 mL | Freq: Once | INTRAVENOUS | Status: AC | PRN
  Administered 2023-01-24: 95 mL via INTRAVENOUS

## 2023-01-24 MED ORDER — NITROGLYCERIN 0.4 MG SL SUBL
SUBLINGUAL_TABLET | SUBLINGUAL | Status: AC
  Filled 2023-01-24: qty 2

## 2023-01-24 MED ORDER — NITROGLYCERIN 0.4 MG SL SUBL
0.8000 mg | SUBLINGUAL_TABLET | Freq: Once | SUBLINGUAL | Status: AC
  Administered 2023-01-24: 0.8 mg via SUBLINGUAL

## 2023-02-05 ENCOUNTER — Ambulatory Visit (HOSPITAL_BASED_OUTPATIENT_CLINIC_OR_DEPARTMENT_OTHER)
Admission: RE | Admit: 2023-02-05 | Discharge: 2023-02-05 | Disposition: A | Discharge status: Home / Self Care | Payer: Medicare Other | Source: Ambulatory Visit | Attending: Cardiology | Admitting: Cardiology

## 2023-02-05 DIAGNOSIS — Z01810 Encounter for preprocedural cardiovascular examination: Secondary | ICD-10-CM | POA: Insufficient documentation

## 2023-02-05 DIAGNOSIS — I350 Nonrheumatic aortic (valve) stenosis: Secondary | ICD-10-CM | POA: Insufficient documentation

## 2023-02-05 LAB — ECHOCARDIOGRAM COMPLETE
AR max vel: 1.32 cm2
AV Area VTI: 1.38 cm2
AV Area mean vel: 1.2 cm2
AV Mean grad: 39 mmHg
AV Peak grad: 64.4 mmHg
Ao pk vel: 4.01 m/s
Area-P 1/2: 3.74 cm2
Calc EF: 74.7 %
MV M vel: 5.32 m/s
MV Peak grad: 113.2 mmHg
P 1/2 time: 877 ms
S' Lateral: 1.5 cm
Single Plane A2C EF: 61.2 %
Single Plane A4C EF: 82.7 %

## 2023-02-06 ENCOUNTER — Telehealth: Payer: Self-pay

## 2023-02-06 ENCOUNTER — Other Ambulatory Visit: Payer: Self-pay

## 2023-02-06 DIAGNOSIS — R931 Abnormal findings on diagnostic imaging of heart and coronary circulation: Secondary | ICD-10-CM

## 2023-02-06 NOTE — Telephone Encounter (Signed)
-----   Message from Jansen R Revankar sent at 02/05/2023  8:30 AM EDT ----- Markedly elevated calcium score.  Please bring him in for a Chem-7 liver lipid check.  I think he also is scheduled for echo today.  He needs to take a coated baby aspirin daily.  Diet and exercise.  Copy primary care Garwin Brothers, MD 02/05/2023 8:29 AM

## 2023-02-06 NOTE — Telephone Encounter (Signed)
Pt has an appt to discuss 9/19.

## 2023-02-07 ENCOUNTER — Ambulatory Visit: Payer: Medicare Other | Attending: Cardiology | Admitting: Cardiology

## 2023-02-07 ENCOUNTER — Encounter: Payer: Self-pay | Admitting: Cardiology

## 2023-02-07 VITALS — BP 126/64 | HR 106 | Ht 62.0 in | Wt 178.0 lb

## 2023-02-07 DIAGNOSIS — I350 Nonrheumatic aortic (valve) stenosis: Secondary | ICD-10-CM | POA: Diagnosis not present

## 2023-02-07 DIAGNOSIS — I2510 Atherosclerotic heart disease of native coronary artery without angina pectoris: Secondary | ICD-10-CM | POA: Insufficient documentation

## 2023-02-07 DIAGNOSIS — E088 Diabetes mellitus due to underlying condition with unspecified complications: Secondary | ICD-10-CM | POA: Diagnosis not present

## 2023-02-07 DIAGNOSIS — I10 Essential (primary) hypertension: Secondary | ICD-10-CM

## 2023-02-07 DIAGNOSIS — I351 Nonrheumatic aortic (valve) insufficiency: Secondary | ICD-10-CM | POA: Diagnosis not present

## 2023-02-07 DIAGNOSIS — E782 Mixed hyperlipidemia: Secondary | ICD-10-CM

## 2023-02-07 NOTE — Progress Notes (Signed)
Cardiology Office Note:    Date:  02/07/2023   ID:  Benjamin Davila, DOB 10-27-1940, MRN [id number]  PCP:  Soundra Pilon, FNP  Cardiologist:  Garwin Brothers, MD   Referring MD: Soundra Pilon, FNP    ASSESSMENT:    1. Aortic stenosis, moderate   2. Aortic valve insufficiency, etiology of cardiac valve disease unspecified   3. Essential hypertension   4. Diabetes mellitus due to underlying condition with unspecified complications (HCC)   5. Mixed hyperlipidemia   6. Coronary artery disease involving native coronary artery of native heart without angina pectoris    PLAN:    In order of problems listed above:  Coronary artery disease: Nonobstructive in nature: CT coronary angiography report is detailed below.  Medical management recommended. Moderate aortic stenosis: Discussed symptoms of chest pain syncope and dyspnea on exertion with him as education.  He vocalized understanding.  He has no symptoms at this time. Essential hyper tension: Blood pressure stable and diet was emphasized. Mixed dyslipidemia and diabetes mellitus: Lifestyle modification urged his lipids are fine but we could not get his LDL under 50.  He had blood work today and we will monitor this. Preoperative cardiovascular evaluation: In view of the above he is at moderate risk for coronary events during the aforementioned surgery.  Meticulous hemodynamic monitoring will further reduce the risk of coronary events.  Please do not hesitate to call us if there are any questions in his cardiovascular management. Patient will be seen in follow-up appointment in 6 months or earlier if the patient has any concerns.  Medication Adjustments/Labs and Tests Ordered: Current medicines are reviewed at length with the patient today.  Concerns regarding medicines are outlined above.  No orders of the defined types were placed in this encounter.  No orders of the defined types were placed in this encounter.    No chief  complaint on file.    History of Present Illness:    Benjamin Davila is a 82 y.o. male.  Patient has past medical history of recently diagnosed coronary artery disease by CT coronary angiography, moderate aortic stenosis, essential hypertension, mixed dyslipidemia and diabetes mellitus.  He leads a sedentary lifestyle because of orthopedic issues involving his back.  He denies any chest pain orthopnea or PND.  At the time of my evaluation, the patient is alert awake oriented and in no distress.  His wife accompanies him for this visit.  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   History of pancreatitis 07/12/2020   History of pancytopenia 07/12/2020   Hyperlipidemia, unspecified 07/12/2020   Irregular heart beat 07/12/2020   Left inguinal pain 12/26/2022   Long term (current) use of insulin (HCC) 07/12/2020   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   Lumbar radiculopathy 06/17/2020   Lumbar spondylosis 07/28/2020   Malignant neoplasm of prostate (HCC) 06/24/2019   Mood disorder (HCC) 07/12/2020   Pain of left hip joint 08/25/2019   PONV (postoperative nausea and vomiting)    Prostate cancer (HCC)    S/P lumbar laminectomy 08/09/2021   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   Systolic murmur 07/12/2020  Type 2 diabetes mellitus with other specified complication (HCC) 07/12/2020    Past Surgical History:  Procedure Laterality Date   BACK SURGERY     lumbar surgery 20 years ago per patient   CATARACT EXTRACTION     CHOLECYSTECTOMY     20 years ago  per patient   COLONOSCOPY     LUMBAR LAMINECTOMY/DECOMPRESSION MICRODISCECTOMY Bilateral 08/09/2021   Procedure: Lumbar decompressive laminectomy  - Lumbar two-Lumabr three - Lumbar three-Lumbar four - Lumbar four-Lumbar five - Lumabr five-Sacal one - Bilateral, Posterior Lateral Fusion Lumbar three-four-Right;  Surgeon: Tia Alert, MD;  Location: [facility] OR;  Service: Neurosurgery;  Laterality: Bilateral;   TONSILLECTOMY     as a kid    Current Medications: Current Meds  Medication Sig   atorvastatin (LIPITOR) 20 MG tablet Take 20 mg by mouth every morning.   CALCIUM PO Take 2 tablets by mouth every morning.   diazepam (VALIUM) 5 MG tablet Take 1 tablet (5 mg total) by mouth at bedtime as needed for anxiety (moods,and muscle spasm).   ferrous sulfate 325 (65 FE) MG tablet Take 325 mg by mouth in the morning, at noon, and at bedtime.   insulin glargine (LANTUS SOLOSTAR) 100 UNIT/ML Solostar Pen Inject 32 Units into the skin daily before breakfast.   Krill Oil 500 MG CAPS Take 1,000 mg by mouth every morning.   losartan (COZAAR) 100 MG tablet Take 100 mg by mouth daily.   metFORMIN (GLUCOPHAGE) 500 MG tablet Take 1,000 mg by mouth 2 (two) times a day.   Multiple Vitamin (MULTIVITAMIN WITH MINERALS) TABS tablet Take 2 tablets by mouth every morning.   tamsulosin (FLOMAX) 0.4 MG CAPS capsule Take 0.4 mg by mouth daily as needed (infrequent urination).   Testosterone 1.62 % GEL Apply 40.5 mg topically every morning.     Allergies:   Lisinopril   Social History   Socioeconomic History   Marital status: Married    Spouse name: Not on file   Number of children: Not on file   Years of education: Not on file   Highest education level: Not on file  Occupational History   Not on file  Tobacco Use   Smoking status: Never   Smokeless tobacco: Never  Vaping Use   Vaping status: Never Used  Substance and Sexual Activity   Alcohol use: Not Currently   Drug use: Never   Sexual activity: Not  on file  Other Topics Concern   Not on file  Social History Narrative   Not on file   Social Determinants of Health   Financial Resource Strain: Not on file  Food Insecurity: Not on file  Transportation Needs: Not on file  Physical Activity: Not on file  Stress: Not on file  Social Connections: Not on file     Family History: The patient's family history includes Lupus in his mother.  ROS:   Please see the history of present illness.    All other systems reviewed and are negative.  EKGs/Labs/Other Studies Reviewed:    The following studies were reviewed today:   IMPRESSION: 1. Coronary calcium score of 761. This was 62nd percentile for age, sex, and race matched control. Total plaque volume 940, over 50th percentile for age and gender.   2. Normal coronary origin with right dominance.   3. CAD-RADS 2. Mild non-obstructive CAD (25-49%). Consider non-atherosclerotic causes of chest pain. Consider preventive therapy and risk factor modification.   4. Aortic valve calcium score of 4989.  Tri-leaflet valve that is somewhat restricted.        ECHOCARDIOGRAM REPORT       Patient Name:   Benjamin Davila Date of Exam: 02/05/2023 Medical Rec #:  [id number]      Height:       62.0 in Accession #:    [id number]     Weight:       182.1 lb Date of Birth:  1940-09-04     BSA:          1.837 m Patient Age:    81 years       BP:           148/78 mmHg Patient Gender: M              HR:           95 bpm. Exam Location:  [place]  Procedure: 2D Echo, 3D Echo, Cardiac Doppler and Color Doppler  Indications:    I35.0 Nonrheumatic aortic (valve) stenosis   History:        Patient has prior history of Echocardiogram examinations, most                 recent 08/12/2020. Pre-op clearance, Hx prostate cancer, Aortic                 Valve Disease, Signs/Symptoms:Murmur; Risk Factors:Hypertension,                 Diabetes, Dyslipidemia and Non-Smoker.   Sonographer:    Jake Seats RDMS, RVT, RDCS Referring Phys: Rito Ehrlich [facility]  IMPRESSIONS    1. Left ventricular ejection fraction, by estimation, is 60 to 65%. Left ventricular ejection fraction by 3D volume is 62 %. The left ventricle has normal function. The left ventricle has no regional wall motion abnormalities. There is mild left ventricular hypertrophy. Left ventricular diastolic parameters are consistent with Grade I diastolic dysfunction (impaired relaxation).  2. Right ventricular systolic function is normal. The right ventricular size is normal.  3. Left atrial size was mild to moderately dilated.  4. The mitral valve is normal in structure. Mild mitral valve regurgitation. No evidence of mitral stenosis.  5. The aortic valve is calcified. There is moderate calcification of the aortic valve. There is moderate thickening of the aortic valve. Aortic valve regurgitation is not visualized. Moderate aortic valve stenosis. Aortic valve area, by VTI measures 1.38 cm. Aortic valve mean gradient measures 39.0 mmHg.  6. The inferior vena cava is normal in size with greater than 50% respiratory variability, suggesting right atrial pressure of 3 mmHg.       Recent Labs: 12/20/2022: ALT 31; Hemoglobin 12.0; Platelets 178 01/16/2023: BUN 17; Creatinine, Ser 0.99; Potassium 5.1; Sodium 150  Recent Lipid Panel No results found for: "CHOL", "TRIG", "HDL", "CHOLHDL", "VLDL", "LDLCALC", "LDLDIRECT"  Physical Exam:    VS:  BP 126/64   Pulse (!) 106   Ht 5\' 2"  (1.575 m)   Wt 178 lb 0.6 oz (80.8 kg)   SpO2 94%   BMI 32.56 kg/m     Wt Readings from Last 3 Encounters:  02/07/23 178 lb 0.6 oz (80.8 kg)  01/16/23 182 lb 1.3 oz (82.6 kg)  12/20/22 188 lb 14.4 oz (85.7 kg)     GEN: Patient is in no acute distress HEENT: Normal NECK: No JVD; No carotid bruits LYMPHATICS: No lymphadenopathy CARDIAC: Hear sounds regular, 2/6 systolic murmur at the apex. RESPIRATORY:  Clear to auscultation without  rales, wheezing  or rhonchi  ABDOMEN: Soft, non-tender, non-distended MUSCULOSKELETAL:  No edema; No deformity  SKIN: Warm and dry NEUROLOGIC:  Alert and oriented x 3 PSYCHIATRIC:  Normal affect   Signed, Garwin Brothers, MD  02/07/2023 11:30 AM    [facility]

## 2023-02-07 NOTE — Patient Instructions (Signed)
Medication Instructions:  Your physician recommends that you continue on your current medications as directed. Please refer to the Current Medication list given to you today.  *If you need a refill on your cardiac medications before your next appointment, please call your pharmacy*   Lab Work: None ordered If you have labs (blood work) drawn today and your tests are completely normal, you will receive your results only by: MyChart Message (if you have MyChart) OR A paper copy in the mail If you have any lab test that is abnormal or we need to change your treatment, we will call you to review the results.   Testing/Procedures: None ordered   Follow-Up: At [facility], you and your health needs are our priority.  As part of our continuing mission to provide you with exceptional heart care, we have created designated Provider Care Teams.  These Care Teams include your primary Cardiologist (physician) and Advanced Practice Providers (APPs -  Physician Assistants and Nurse Practitioners) who all work together to provide you with the care you need, when you need it.  We recommend signing up for the patient portal called "MyChart".  Sign up information is provided on this After Visit Summary.  MyChart is used to connect with patients for Virtual Visits (Telemedicine).  Patients are able to view lab/test results, encounter notes, upcoming appointments, etc.  Non-urgent messages can be sent to your provider as well.   To learn more about what you can do with MyChart, go to [email or web address].    Your next appointment:   9 month(s)  The format for your next appointment:   In Person  Provider:   Belva Crome, MD    Other Instructions none  Important Information About Sugar

## 2023-02-08 LAB — COMPREHENSIVE METABOLIC PANEL
ALT: 25 IU/L (ref 0–44)
AST: 23 IU/L (ref 0–40)
Albumin: 4.8 g/dL — ABNORMAL HIGH (ref 3.7–4.7)
Alkaline Phosphatase: 94 IU/L (ref 44–121)
BUN/Creatinine Ratio: 15 (ref 10–24)
BUN: 16 mg/dL (ref 8–27)
Bilirubin Total: 0.8 mg/dL (ref 0.0–1.2)
CO2: 24 mmol/L (ref 20–29)
Calcium: 10.3 mg/dL — ABNORMAL HIGH (ref 8.6–10.2)
Chloride: 99 mmol/L (ref 96–106)
Creatinine, Ser: 1.05 mg/dL (ref 0.76–1.27)
Globulin, Total: 2.6 g/dL (ref 1.5–4.5)
Glucose: 143 mg/dL — ABNORMAL HIGH (ref 70–99)
Potassium: 4.4 mmol/L (ref 3.5–5.2)
Sodium: 140 mmol/L (ref 134–144)
Total Protein: 7.4 g/dL (ref 6.0–8.5)
eGFR: 71 mL/min/{1.73_m2} (ref >59)

## 2023-02-08 LAB — LIPID PANEL
Chol/HDL Ratio: 2.7 ratio (ref 0.0–5.0)
Cholesterol, Total: 116 mg/dL (ref 100–199)
HDL: 43 mg/dL (ref >39)
LDL Chol Calc (NIH): 51 mg/dL (ref 0–99)
Triglycerides: 123 mg/dL (ref 0–149)
VLDL Cholesterol Cal: 22 mg/dL (ref 5–40)

## 2023-02-12 ENCOUNTER — Ambulatory Visit: Payer: Medicare Other | Admitting: Oncology

## 2023-02-12 ENCOUNTER — Other Ambulatory Visit: Payer: Medicare Other

## 2023-03-05 ENCOUNTER — Inpatient Hospital Stay: Payer: Medicare Other | Admitting: Oncology

## 2023-03-05 ENCOUNTER — Inpatient Hospital Stay: Payer: Medicare Other

## 2023-03-07 ENCOUNTER — Other Ambulatory Visit: Payer: Self-pay | Admitting: Neurological Surgery

## 2023-03-15 NOTE — Pre-Procedure Instructions (Addendum)
Surgical Instructions    Your procedure is scheduled on March 27, 2023.  Report to [facility] Main Entrance "A" at 9:40 A.M., then check in with the Admitting office.  Call this number if you have problems the morning of surgery:  [phone]   If you have any questions prior to your surgery date call [phone]: Open Monday-Friday 8am-4pm If you experience any cold or flu symptoms such as cough, fever, chills, shortness of breath, etc. between now and your scheduled surgery, please notify us at the above number     Remember:  Do not eat or drink after midnight the night before your surgery     Take these medicines the morning of surgery with A SIP OF WATER:  atorvastatin (LIPITOR)   If needed: diazepam (VALIUM)  tamsulosin (FLOMAX)   As of today, STOP taking any Aspirin (unless otherwise instructed by your surgeon) Aleve, Naproxen, Ibuprofen, Motrin, Advil, Goody's, BC's, all herbal medications, fish oil, and all vitamins.       WHAT DO I DO ABOUT MY DIABETES MEDICATION?   Do not take oral diabetes medicines (pills) the morning of surgery. DO NOT take Metformin (Glucophage) the day of surgery.       THE MORNING OF SURGERY, take 17 units (Half dose) of Lantus (insulin glargine).  The day of surgery, do not take other diabetes injectables, including Byetta (exenatide), Bydureon (exenatide ER), Victoza (liraglutide), or Trulicity (dulaglutide).  If your CBG is greater than 220 mg/dL, you may take  of your sliding scale (correction) dose of insulin.   HOW TO MANAGE YOUR DIABETES BEFORE AND AFTER SURGERY  Why is it important to control my blood sugar before and after surgery? Improving blood sugar levels before and after surgery helps healing and can limit problems. A way of improving blood sugar control is eating a healthy diet by:  Eating less sugar and carbohydrates  Increasing activity/exercise  Talking with your doctor about reaching your blood sugar goals High  blood sugars (greater than 180 mg/dL) can raise your risk of infections and slow your recovery, so you will need to focus on controlling your diabetes during the weeks before surgery. Make sure that the doctor who takes care of your diabetes knows about your planned surgery including the date and location.  How do I manage my blood sugar before surgery? Check your blood sugar at least 4 times a day, starting 2 days before surgery, to make sure that the level is not too high or low.  Check your blood sugar the morning of your surgery when you wake up and every 2 hours until you get to the Short Stay unit.  If your blood sugar is less than 70 mg/dL, you will need to treat for low blood sugar: Do not take insulin. Treat a low blood sugar (less than 70 mg/dL) with  cup of clear juice (cranberry or apple), 4 glucose tablets, OR glucose gel. Recheck blood sugar in 15 minutes after treatment (to make sure it is greater than 70 mg/dL). If your blood sugar is not greater than 70 mg/dL on recheck, call [phone] for further instructions. Report your blood sugar to the short stay nurse when you get to Short Stay.  If you are admitted to the hospital after surgery: Your blood sugar will be checked by the staff and you will probably be given insulin after surgery (instead of oral diabetes medicines) to make sure you have good blood sugar levels. The goal for blood sugar control after  surgery is 80-180 mg/dL.       [facility] is not responsible for any belongings or valuables.    Do NOT Smoke (Tobacco/Vaping)  24 hours prior to your procedure  If you use a CPAP at night, you may bring your mask for your overnight stay.   Contacts, glasses, hearing aids, dentures or partials may not be worn into surgery, please bring cases for these belongings   For patients admitted to the hospital, discharge time will be determined by your treatment team.   Patients discharged the day of surgery will not be  allowed to drive home, and someone needs to stay with them for 24 hours.   SURGICAL WAITING ROOM VISITATION Patients having surgery or a procedure may have no more than 2 support people in the waiting area - these visitors may rotate.   Children under the age of 43 must have an adult with them who is not the patient. If the patient needs to stay at the hospital during part of their recovery, the visitor guidelines for inpatient rooms apply. Pre-op nurse will coordinate an appropriate time for 1 support person to accompany patient in pre-op.  This support person may not rotate.   Please refer to [email or web address] for the visitor guidelines for Inpatients (after your surgery is over and you are in a regular room).    Special instructions:    Oral Hygiene is also important to reduce your risk of infection.  Remember - BRUSH YOUR TEETH THE MORNING OF SURGERY WITH YOUR REGULAR TOOTHPASTE     Pre-operative 5 CHG Bath Instructions   You can play a key role in reducing the risk of infection after surgery. Your skin needs to be as free of germs as possible. You can reduce the number of germs on your skin by washing with CHG (chlorhexidine gluconate) soap before surgery. CHG is an antiseptic soap that kills germs and continues to kill germs even after washing.   DO NOT use if you have an allergy to chlorhexidine/CHG or antibacterial soaps. If your skin becomes reddened or irritated, stop using the CHG and notify one of our RNs at [phone].   Please shower with the CHG soap starting 4 days before surgery using the following schedule:     Please keep in mind the following:  DO NOT shave, including legs and underarms, starting the day of your first shower.   You may shave your face at any point before/day of surgery.  Place clean sheets on your bed the day you start using CHG soap. Use a clean washcloth (not used since being washed) for each  shower. DO NOT sleep with pets once you start using the CHG.   CHG Shower Instructions:  If you choose to wash your hair and private area, wash first with your normal shampoo/soap.  After you use shampoo/soap, rinse your hair and body thoroughly to remove shampoo/soap residue.  Turn the water OFF and apply about 3 tablespoons (45 ml) of CHG soap to a CLEAN washcloth.  Apply CHG soap ONLY FROM YOUR NECK DOWN TO YOUR TOES (washing for 3-5 minutes)  DO NOT use CHG soap on face, private areas, open wounds, or sores.  Pay special attention to the area where your surgery is being performed.  If you are having back surgery, having someone wash your back for you may be helpful. Wait 2 minutes after CHG soap is applied, then you may rinse off the CHG soap.  Pat dry with a clean  towel  Put on clean clothes/pajamas   If you choose to wear lotion, please use ONLY the CHG-compatible lotions on the back of this paper.     Additional instructions for the day of surgery: DO NOT APPLY any lotions, deodorants, cologne, or perfumes.   Put on clean/comfortable clothes.  Brush your teeth.  Ask your nurse before applying any prescription medications to the skin. Do not wear jewelry or makeup. Do not bring valuables to the hospital. Do not wear nail polish, gel polish, artificial nails, or any other type of covering on natural nails (fingers and toes) If you have artificial nails or gel coating that need to be removed by a nail salon, please have this removed prior to surgery. Artificial nails or gel coating may interfere with anesthesia's ability to adequately monitor your vital signs.     CHG Compatible Lotions   Aveeno Moisturizing lotion  Cetaphil Moisturizing Cream  Cetaphil Moisturizing Lotion  Clairol Herbal Essence Moisturizing Lotion, Dry Skin  Clairol Herbal Essence Moisturizing Lotion, Extra Dry Skin  Clairol Herbal Essence Moisturizing Lotion, Normal Skin  Curel Age Defying Therapeutic  Moisturizing Lotion with Alpha Hydroxy  Curel Extreme Care Body Lotion  Curel Soothing Hands Moisturizing Hand Lotion  Curel Therapeutic Moisturizing Cream, Fragrance-Free  Curel Therapeutic Moisturizing Lotion, Fragrance-Free  Curel Therapeutic Moisturizing Lotion, Original Formula  Eucerin Daily Replenishing Lotion  Eucerin Dry Skin Therapy Plus Alpha Hydroxy Crme  Eucerin Dry Skin Therapy Plus Alpha Hydroxy Lotion  Eucerin Original Crme  Eucerin Original Lotion  Eucerin Plus Crme Eucerin Plus Lotion  Eucerin TriLipid Replenishing Lotion  Keri Anti-Bacterial Hand Lotion  Keri Deep Conditioning Original Lotion Dry Skin Formula Softly Scented  Keri Deep Conditioning Original Lotion, Fragrance Free Sensitive Skin Formula  Keri Lotion Fast Absorbing Fragrance Free Sensitive Skin Formula  Keri Lotion Fast Absorbing Softly Scented Dry Skin Formula  Keri Original Lotion  Keri Skin Renewal Lotion Keri Silky Smooth Lotion  Keri Silky Smooth Sensitive Skin Lotion  Nivea Body Creamy Conditioning Oil  Nivea Body Extra Enriched Lotion  Nivea Body Original Lotion  Nivea Body Sheer Moisturizing Lotion Nivea Crme  Nivea Skin Firming Lotion  NutraDerm 30 Skin Lotion  NutraDerm Skin Lotion  NutraDerm Therapeutic Skin Cream  NutraDerm Therapeutic Skin Lotion  ProShield Protective Hand Cream  Provon moisturizing lotion     If you received a COVID test during your pre-op visit, it is requested that you wear a mask when out in public, stay away from anyone that may not be feeling well, and notify your surgeon if you develop symptoms. If you have been in contact with anyone that has tested positive in the last 10 days, please notify your surgeon.    Please read over the following fact sheets that you were given.

## 2023-03-18 ENCOUNTER — Telehealth: Payer: Self-pay | Admitting: Family Medicine

## 2023-03-18 ENCOUNTER — Encounter (HOSPITAL_COMMUNITY): Payer: Self-pay

## 2023-03-18 ENCOUNTER — Other Ambulatory Visit: Payer: Self-pay

## 2023-03-18 ENCOUNTER — Encounter (HOSPITAL_COMMUNITY)
Admission: RE | Admit: 2023-03-18 | Discharge: 2023-03-18 | Disposition: A | Discharge status: Home / Self Care | Payer: Medicare Other | Source: Ambulatory Visit | Attending: Neurological Surgery | Admitting: Neurological Surgery

## 2023-03-18 VITALS — BP 155/86 | HR 97 | Temp 98.0°F | Resp 17 | Ht 70.0 in | Wt 177.4 lb

## 2023-03-18 DIAGNOSIS — E785 Hyperlipidemia, unspecified: Secondary | ICD-10-CM | POA: Insufficient documentation

## 2023-03-18 DIAGNOSIS — I10 Essential (primary) hypertension: Secondary | ICD-10-CM | POA: Diagnosis not present

## 2023-03-18 DIAGNOSIS — K754 Autoimmune hepatitis: Secondary | ICD-10-CM | POA: Diagnosis not present

## 2023-03-18 DIAGNOSIS — Z8546 Personal history of malignant neoplasm of prostate: Secondary | ICD-10-CM | POA: Diagnosis not present

## 2023-03-18 DIAGNOSIS — Z794 Long term (current) use of insulin: Secondary | ICD-10-CM | POA: Diagnosis not present

## 2023-03-18 DIAGNOSIS — I2510 Atherosclerotic heart disease of native coronary artery without angina pectoris: Secondary | ICD-10-CM | POA: Diagnosis not present

## 2023-03-18 DIAGNOSIS — Z01812 Encounter for preprocedural laboratory examination: Secondary | ICD-10-CM | POA: Insufficient documentation

## 2023-03-18 DIAGNOSIS — M4316 Spondylolisthesis, lumbar region: Secondary | ICD-10-CM | POA: Diagnosis not present

## 2023-03-18 DIAGNOSIS — E119 Type 2 diabetes mellitus without complications: Secondary | ICD-10-CM | POA: Insufficient documentation

## 2023-03-18 DIAGNOSIS — K2980 Duodenitis without bleeding: Secondary | ICD-10-CM | POA: Diagnosis not present

## 2023-03-18 DIAGNOSIS — Z01818 Encounter for other preprocedural examination: Secondary | ICD-10-CM

## 2023-03-18 DIAGNOSIS — I080 Rheumatic disorders of both mitral and aortic valves: Secondary | ICD-10-CM | POA: Diagnosis not present

## 2023-03-18 DIAGNOSIS — I700 Atherosclerosis of aorta: Secondary | ICD-10-CM | POA: Insufficient documentation

## 2023-03-18 HISTORY — DX: Acute pancreatitis without necrosis or infection, unspecified: K85.90

## 2023-03-18 LAB — SURGICAL PCR SCREEN
MRSA, PCR: NEGATIVE
Staphylococcus aureus: POSITIVE — AB

## 2023-03-18 LAB — PROTIME-INR
INR: 1.1 (ref 0.8–1.2)
Prothrombin Time: 14.3 s (ref 11.4–15.2)

## 2023-03-18 LAB — TYPE AND SCREEN
ABO/RH(D): A POS
Antibody Screen: NEGATIVE

## 2023-03-18 LAB — GLUCOSE, CAPILLARY: Glucose-Capillary: 140 mg/dL — ABNORMAL HIGH (ref 70–99)

## 2023-03-18 NOTE — Telephone Encounter (Signed)
Form completed and provided to wife at time of nurse visit.

## 2023-03-18 NOTE — Progress Notes (Signed)
PCP - Soundra Pilon, FNP Cardiologist - Belva Crome, MD  PPM/ICD - denies   Chest x-ray - N/A EKG - 12/20/22 Stress Test - pt reports normal stress test years ago ECHO - 02/05/23 Cardiac Cath - denies  Sleep Study - denies   Fasting Blood Sugar - pt reports that he takes his CBG 3 times a month (typically 70-100 fasting). Hgb A1c 6.6 on lab work from PCP 03/13/23.    Last dose of GLP1 agonist-  N/A  Blood Thinner Instructions: N/A Aspirin Instructions:N/A  ERAS Protcol - NPO order   COVID TEST- N/A   Anesthesia review: yse- pt was previously scheduled but cancelled d/t needing further cardiac workup and clearance. Pt had ECHO on 02/05/23 and saw cardiologist. Pt denies any cardiac symptoms/issues. Lab work from PCP on 03/13/23 in paper chart (CMP, CBC).   Pt reports that he still has 2 bottles of CHG soap at home on his desk that he can use.   Patient denies shortness of breath, fever, cough and chest pain at PAT appointment   All instructions explained to the patient, with a verbal understanding of the material. Patient agrees to go over the instructions while at home for a better understanding. The opportunity to ask questions was provided.

## 2023-03-18 NOTE — Telephone Encounter (Signed)
Per pt, we completed handicapped placard form for him. It expires 03/2023, pt would like another completed for the future as he is getting surgery.  Please call when ready.

## 2023-03-19 NOTE — Progress Notes (Addendum)
Anesthesia Chart Review:  Case: [id number] Date/Time: 03/27/23 1128   Procedure: Left - L2-L3 - L3-L4 hemifacetectomy, posterolateral fusion L2-4, segmental fixation L2-4 (Left: Back)   Anesthesia type: General   Pre-op diagnosis: Spondylolisthesis   [facility] ROOM 20 / [facility]   Surgeons: Arman Bogus, MD       DISCUSSION: Patient is a 82 year old male scheduled for the above procedure.  History includes never smoker, post-operative N/V, HTN, DM2, HLD, murmur/aortic stenosis (moderate AS 02/05/23 echo), irregular HR (without afib), autoimmune hepatitis, prostate cancer (s/p radiation 2021), pancreatitis (2007), iron deficiency anemia, spinal surgery (L2-5 laminectomies/foraminotomies, right L3-4 intertransverse arthrodesis 08/09/21). Chronic duodenitis and chronic active gastritis on 03/13/21 EGD/colonoscopy.   Last visit with cardiologist Dr. Normajean Baxter was on 02/07/23. 01/24/23 CCTA showed CAC of 761 (62nd percentile) with mild non-obstructive CAD (25-49%. 02/05/23 echo showed LVEF 60-65%, no regional wall motion abnormalities, grade 1 diastolic dysfunction, normal RV systolic function, moderately dilated LA, mild MR, moderate AS with AV mean gradient 39.0 mmHg, AVA by VTI 1.38 cm. No symptoms of moderate AS. He wrote, "Preoperative cardiovascular evaluation: In view of the above he is at moderate risk for coronary events during the aforementioned surgery. Meticulous hemodynamic monitoring will further reduce the risk of coronary events."  He had labs on 03/13/23 at [facility] including CMP, CBC, A1c. Results includes WBC 4.2, H/H 11.7/34.3, PLT count 228, A1c 6.6%, AST 23, ALT 21, ALP 96, Albumin 4.2, Na 140, K 4.5, Ca 9.8, BUN 15, Cr 0.89, glucose 148.   Anesthesia team to evaluate on the day of surgery.   VS: BP (!) 155/86   Pulse 97   Temp 36.7 C   Resp 17   Ht 5\' 10"  (1.778 m)   Wt 80.5 kg   SpO2 97%   BMI 25.45 kg/m   PROVIDERS: Soundra Pilon, FNP is PCP  Belva Crome, MD is cardiologist Thornton Papas, MD is HEM, last visit 05/03/22 for follow-up IDA. Margaretmary Dys, MD is RAD-ONC Jerilee Field, MD is urologist Kathi Der, MD is GI   LABS: See DISCUSSION regarding 03/13/23 labs through [facility].  (all labs ordered are listed, but only abnormal results are displayed)  Labs Reviewed  SURGICAL PCR SCREEN - Abnormal; Notable for the following components:      Result Value   Staphylococcus aureus POSITIVE (*)    All other components within normal limits  GLUCOSE, CAPILLARY - Abnormal; Notable for the following components:   Glucose-Capillary 140 (*)    All other components within normal limits  PROTIME-INR  TYPE AND SCREEN     IMAGES: CT Chest (over read CCTA) 01/24/23: IMPRESSION: 1. Mild elevation of right hemidiaphragm with compressive atelectasis at the right lung base. 2. Aortic Atherosclerosis (ICD10-I70.0).   EKG: EKG 12/20/22: Normal sinus rhythm Right bundle branch block fractionated QRS ST & T wave abnormality, consider lateral ischemia Abnormal ECG When compared with ECG of 02-Aug-2021 11:05, PREVIOUS ECG IS PRESENT Confirmed by Sherryl Manges [phone]) on 12/22/2022 3:25:18 PM   CV: CT Coronary 01/24/23: IMPRESSION: 1. Coronary calcium score of 761. This was 62nd percentile for age, sex, and race matched control. Total plaque volume 940, over 50th percentile for age and gender.  2. Normal coronary origin with right dominance. 3. CAD-RADS 2. Mild non-obstructive CAD (25-49%). Consider non-atherosclerotic causes of chest pain. Consider preventive therapy and risk factor modification.  4. Aortic valve calcium score of 4989. Tri-leaflet valve that is somewhat restricted. Consider echocardiogram. RECOMMENDATION:.Marland KitchenMarland Kitchen  If CAC is >=100 or >=75th percentile, it is reasonable to initiate statin therapy at any age.   Cardiology referral should be considered for patients with CAC scores =400 or >=75th  percentile...   Echo 02/05/23: IMPRESSIONS   1. Left ventricular ejection fraction, by estimation, is 60 to 65%. Left  ventricular ejection fraction by 3D volume is 62 %. The left ventricle has  normal function. The left ventricle has no regional wall motion  abnormalities. There is mild left  ventricular hypertrophy. Left ventricular diastolic parameters are  consistent with Grade I diastolic dysfunction (impaired relaxation).   2. Right ventricular systolic function is normal. The right ventricular  size is normal.   3. Left atrial size was mild to moderately dilated.   4. The mitral valve is normal in structure. Mild mitral valve  regurgitation. No evidence of mitral stenosis.   5. The aortic valve is calcified. There is moderate calcification of the  aortic valve. There is moderate thickening of the aortic valve. Aortic  valve regurgitation is not visualized. Moderate aortic valve stenosis.  Aortic valve area, by VTI measures  1.38 cm. Aortic valve mean gradient measures 39.0 mmHg.   6. The inferior vena cava is normal in size with greater than 50%  respiratory variability, suggesting right atrial pressure of 3 mmHg.   Comparison(s): Echocardiogram done 08/12/20 showed an EF of 60-65% with  moderate AS and an AV mean Grad of 20 mmHg.    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   History of pancreatitis 07/12/2020   History of pancytopenia 07/12/2020   Hyperlipidemia, unspecified 07/12/2020   Irregular heart beat 07/12/2020   Left inguinal pain  12/26/2022   Long term (current) use of insulin (HCC) 07/12/2020   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   Lumbar radiculopathy 06/17/2020   Lumbar spondylosis 07/28/2020   Malignant neoplasm of prostate (HCC) 06/24/2019   Mood disorder (HCC) 07/12/2020   Pain of left hip joint 08/25/2019   Pancreatitis    2007   PONV (postoperative nausea and vomiting)    Prostate cancer (HCC)    S/P lumbar laminectomy 08/09/2021   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   Systolic murmur 07/12/2020   Type 2 diabetes mellitus with other specified complication (HCC) 07/12/2020    Past Surgical History:  Procedure Laterality Date   BACK SURGERY     lumbar surgery 20 years ago per patient   CATARACT EXTRACTION     CHOLECYSTECTOMY     20 years ago per patient   COLONOSCOPY     LUMBAR LAMINECTOMY/DECOMPRESSION MICRODISCECTOMY Bilateral 08/09/2021   Procedure: Lumbar decompressive laminectomy  - Lumbar two-Lumabr three - Lumbar three-Lumbar four - Lumbar four-Lumbar five - Lumabr five-Sacal one - Bilateral, Posterior Lateral Fusion Lumbar three-four-Right;  Surgeon: Tia Alert, MD;  Location: [facility] OR;  Service: Neurosurgery;  Laterality: Bilateral;   TONSILLECTOMY     as a kid    MEDICATIONS:  acetaminophen (TYLENOL) 500 MG tablet   atorvastatin (LIPITOR) 20 MG tablet   CALCIUM PO   diazepam (VALIUM) 5 MG tablet   ferrous sulfate 325 (65 FE) MG tablet  insulin glargine (LANTUS SOLOSTAR) 100 UNIT/ML Solostar Pen   Krill Oil 500 MG CAPS   losartan (COZAAR) 100 MG tablet   metFORMIN (GLUCOPHAGE) 500 MG tablet   Multiple Vitamin (MULTIVITAMIN WITH MINERALS) TABS tablet   tamsulosin (FLOMAX) 0.4 MG CAPS capsule   Testosterone 1.62 % GEL   No current facility-administered medications for this encounter.    Shonna Chock, PA-C Surgical Short Stay/Anesthesiology [facility] Phone [phone] [facility] Phone [phone] 03/19/2023 3:11 PM

## 2023-03-19 NOTE — Anesthesia Preprocedure Evaluation (Addendum)
Anesthesia Evaluation  Patient identified by MRN, date of birth, ID band Patient awake    Reviewed: Allergy & Precautions, NPO status , Patient's Chart, lab work & pertinent test results  History of Anesthesia Complications (+) PONV and history of anesthetic complications  Airway Mallampati: II  TM Distance: >3 FB Neck ROM: Full    Dental  (+) Dental Advisory Given   Pulmonary neg pulmonary ROS   breath sounds clear to auscultation       Cardiovascular hypertension, Pt. on medications + CAD  + Valvular Problems/Murmurs AS  Rhythm:Regular Rate:Normal     Neuro/Psych  Neuromuscular disease    GI/Hepatic negative GI ROS,,,(+) Hepatitis -, Autoimmune  Endo/Other  diabetes, Type 2, Insulin Dependent    Renal/GU negative Renal ROS     Musculoskeletal  (+) Arthritis ,    Abdominal   Peds  Hematology  (+) Blood dyscrasia, anemia   Anesthesia Other Findings   Reproductive/Obstetrics                             Anesthesia Physical Anesthesia Plan  ASA: 3  Anesthesia Plan: General   Post-op Pain Management: Tylenol PO (pre-op)*   Induction: Intravenous  PONV Risk Score and Plan: 3 and Dexamethasone, Ondansetron and Treatment may vary due to age or medical condition  Airway Management Planned: Oral ETT  Additional Equipment: Arterial line  Intra-op Plan:   Post-operative Plan: Extubation in OR  Informed Consent: I have reviewed the patients History and Physical, chart, labs and discussed the procedure including the risks, benefits and alternatives for the proposed anesthesia with the patient or authorized representative who has indicated his/her understanding and acceptance.     Dental advisory given  Plan Discussed with: CRNA  Anesthesia Plan Comments: (  )       Anesthesia Quick Evaluation

## 2023-03-27 ENCOUNTER — Other Ambulatory Visit: Payer: Self-pay

## 2023-03-27 ENCOUNTER — Inpatient Hospital Stay (HOSPITAL_COMMUNITY): Payer: Medicare Other | Admitting: Anesthesiology

## 2023-03-27 ENCOUNTER — Inpatient Hospital Stay (HOSPITAL_COMMUNITY)
Admission: RE | Admit: 2023-03-27 | Discharge: 2023-03-28 | DRG: 448 | Disposition: A | Discharge status: Home / Self Care | Payer: Medicare Other | Attending: Neurological Surgery | Admitting: Neurological Surgery

## 2023-03-27 ENCOUNTER — Encounter (HOSPITAL_COMMUNITY)
Admission: RE | Disposition: A | Discharge status: Home / Self Care | Payer: Self-pay | Source: Home / Self Care | Attending: Neurological Surgery

## 2023-03-27 ENCOUNTER — Encounter (HOSPITAL_COMMUNITY): Payer: Self-pay | Admitting: Neurological Surgery

## 2023-03-27 ENCOUNTER — Inpatient Hospital Stay (HOSPITAL_COMMUNITY): Payer: Medicare Other

## 2023-03-27 ENCOUNTER — Inpatient Hospital Stay (HOSPITAL_COMMUNITY): Payer: Medicare Other | Admitting: Vascular Surgery

## 2023-03-27 DIAGNOSIS — Z8546 Personal history of malignant neoplasm of prostate: Secondary | ICD-10-CM

## 2023-03-27 DIAGNOSIS — M4316 Spondylolisthesis, lumbar region: Secondary | ICD-10-CM | POA: Diagnosis present

## 2023-03-27 DIAGNOSIS — Z79899 Other long term (current) drug therapy: Secondary | ICD-10-CM | POA: Diagnosis not present

## 2023-03-27 DIAGNOSIS — Z794 Long term (current) use of insulin: Secondary | ICD-10-CM

## 2023-03-27 DIAGNOSIS — E119 Type 2 diabetes mellitus without complications: Secondary | ICD-10-CM | POA: Diagnosis present

## 2023-03-27 DIAGNOSIS — Z8269 Family history of other diseases of the musculoskeletal system and connective tissue: Secondary | ICD-10-CM

## 2023-03-27 DIAGNOSIS — I10 Essential (primary) hypertension: Secondary | ICD-10-CM | POA: Diagnosis present

## 2023-03-27 DIAGNOSIS — N400 Enlarged prostate without lower urinary tract symptoms: Secondary | ICD-10-CM | POA: Diagnosis present

## 2023-03-27 DIAGNOSIS — Z981 Arthrodesis status: Principal | ICD-10-CM

## 2023-03-27 DIAGNOSIS — M5416 Radiculopathy, lumbar region: Secondary | ICD-10-CM | POA: Diagnosis present

## 2023-03-27 DIAGNOSIS — Z888 Allergy status to other drugs, medicaments and biological substances status: Secondary | ICD-10-CM | POA: Diagnosis not present

## 2023-03-27 DIAGNOSIS — E785 Hyperlipidemia, unspecified: Secondary | ICD-10-CM | POA: Diagnosis present

## 2023-03-27 DIAGNOSIS — Z7984 Long term (current) use of oral hypoglycemic drugs: Secondary | ICD-10-CM

## 2023-03-27 DIAGNOSIS — M48061 Spinal stenosis, lumbar region without neurogenic claudication: Principal | ICD-10-CM | POA: Diagnosis present

## 2023-03-27 DIAGNOSIS — Z9049 Acquired absence of other specified parts of digestive tract: Secondary | ICD-10-CM

## 2023-03-27 HISTORY — PX: LAMINECTOMY WITH POSTERIOR LATERAL ARTHRODESIS LEVEL 2: SHX6336

## 2023-03-27 LAB — GLUCOSE, CAPILLARY
Glucose-Capillary: 135 mg/dL — ABNORMAL HIGH (ref 70–99)
Glucose-Capillary: 122 mg/dL — ABNORMAL HIGH (ref 70–99)
Glucose-Capillary: 273 mg/dL — ABNORMAL HIGH (ref 70–99)

## 2023-03-27 SURGERY — LAMINECTOMY WITH POSTERIOR LATERAL ARTHRODESIS LEVEL 2
Anesthesia: General | Site: Spine Lumbar | Laterality: Left

## 2023-03-27 MED ORDER — SODIUM CHLORIDE 0.9% FLUSH: 3.0000 mL | INTRAVENOUS | Status: DC | PRN

## 2023-03-27 MED ORDER — LOSARTAN POTASSIUM 50 MG PO TABS
100.0000 mg | ORAL_TABLET | Freq: Every day | ORAL | Status: DC
  Administered 2023-03-28: 100 mg via ORAL
  Filled 2023-03-27: qty 2

## 2023-03-27 MED ORDER — CHLORHEXIDINE GLUCONATE 0.12 % MT SOLN
15.0000 mL | Freq: Once | OROMUCOSAL | Status: AC
  Administered 2023-03-27: 15 mL via OROMUCOSAL
  Filled 2023-03-27: qty 15

## 2023-03-27 MED ORDER — ONDANSETRON HCL 4 MG PO TABS: 4.0000 mg | ORAL_TABLET | Freq: Four times a day (QID) | ORAL | Status: DC | PRN

## 2023-03-27 MED ORDER — CHLORHEXIDINE GLUCONATE CLOTH 2 % EX PADS: 6.0000 | MEDICATED_PAD | Freq: Once | CUTANEOUS | Status: DC

## 2023-03-27 MED ORDER — PHENYLEPHRINE HCL-NACL 20-0.9 MG/250ML-% IV SOLN
INTRAVENOUS | Status: DC | PRN
  Administered 2023-03-27: 40 ug/min via INTRAVENOUS
  Administered 2023-03-27: 80 ug via INTRAVENOUS
  Administered 2023-03-27 (×2): 160 ug via INTRAVENOUS
  Administered 2023-03-27: 80 ug via INTRAVENOUS
  Administered 2023-03-27: 160 ug via INTRAVENOUS

## 2023-03-27 MED ORDER — INSULIN ASPART 100 UNIT/ML IJ SOLN
0.0000 [IU] | Freq: Every day | INTRAMUSCULAR | Status: DC
  Administered 2023-03-27: 3 [IU] via SUBCUTANEOUS

## 2023-03-27 MED ORDER — FENTANYL CITRATE (PF) 100 MCG/2ML IJ SOLN
25.0000 ug | INTRAMUSCULAR | Status: DC | PRN
  Administered 2023-03-27 (×3): 25 ug via INTRAVENOUS

## 2023-03-27 MED ORDER — ACETAMINOPHEN 500 MG PO TABS
1000.0000 mg | ORAL_TABLET | Freq: Four times a day (QID) | ORAL | Status: AC
  Administered 2023-03-27 – 2023-03-28 (×4): 1000 mg via ORAL
  Filled 2023-03-27 (×4): qty 2

## 2023-03-27 MED ORDER — ORAL CARE MOUTH RINSE: 15.0000 mL | Freq: Once | OROMUCOSAL | Status: AC

## 2023-03-27 MED ORDER — HYDROMORPHONE HCL 1 MG/ML IJ SOLN: 0.5000 mg | INTRAMUSCULAR | Status: DC | PRN

## 2023-03-27 MED ORDER — SURGIPHOR WOUND IRRIGATION SYSTEM - OPTIME: TOPICAL | Status: DC | PRN

## 2023-03-27 MED ORDER — ACETAMINOPHEN 500 MG PO TABS: 1000.0000 mg | ORAL_TABLET | ORAL | Status: DC

## 2023-03-27 MED ORDER — FENTANYL CITRATE (PF) 250 MCG/5ML IJ SOLN
INTRAMUSCULAR | Status: DC | PRN
  Administered 2023-03-27: 50 ug via INTRAVENOUS
  Administered 2023-03-27: 100 ug via INTRAVENOUS
  Administered 2023-03-27 (×2): 50 ug via INTRAVENOUS

## 2023-03-27 MED ORDER — CEFAZOLIN SODIUM-DEXTROSE 2-4 GM/100ML-% IV SOLN
2.0000 g | INTRAVENOUS | Status: AC
Start: 2023-03-27 — End: 2023-03-27
  Administered 2023-03-27: 2 g via INTRAVENOUS
  Filled 2023-03-27: qty 100

## 2023-03-27 MED ORDER — CEFAZOLIN SODIUM-DEXTROSE 2-4 GM/100ML-% IV SOLN
2.0000 g | Freq: Three times a day (TID) | INTRAVENOUS | Status: AC
  Administered 2023-03-27 – 2023-03-28 (×2): 2 g via INTRAVENOUS
  Filled 2023-03-27 (×2): qty 100

## 2023-03-27 MED ORDER — 0.9 % SODIUM CHLORIDE (POUR BTL) OPTIME
TOPICAL | Status: DC | PRN
  Administered 2023-03-27: 1000 mL

## 2023-03-27 MED ORDER — CELECOXIB 200 MG PO CAPS
200.0000 mg | ORAL_CAPSULE | Freq: Two times a day (BID) | ORAL | Status: DC
  Administered 2023-03-27 – 2023-03-28 (×2): 200 mg via ORAL
  Filled 2023-03-27 (×2): qty 1

## 2023-03-27 MED ORDER — LIDOCAINE 2% (20 MG/ML) 5 ML SYRINGE
INTRAMUSCULAR | Status: DC | PRN
  Administered 2023-03-27: 60 mg via INTRAVENOUS

## 2023-03-27 MED ORDER — FENTANYL CITRATE (PF) 250 MCG/5ML IJ SOLN
INTRAMUSCULAR | Status: AC
  Filled 2023-03-27: qty 5

## 2023-03-27 MED ORDER — INSULIN ASPART 100 UNIT/ML IJ SOLN: 0.0000 [IU] | Freq: Three times a day (TID) | INTRAMUSCULAR | Status: DC

## 2023-03-27 MED ORDER — SUGAMMADEX SODIUM 200 MG/2ML IV SOLN
INTRAVENOUS | Status: DC | PRN
  Administered 2023-03-27: 156 mg via INTRAVENOUS

## 2023-03-27 MED ORDER — SODIUM CHLORIDE 0.9% FLUSH: 3.0000 mL | Freq: Two times a day (BID) | INTRAVENOUS | Status: DC

## 2023-03-27 MED ORDER — LACTATED RINGERS IV SOLN: INTRAVENOUS | Status: DC

## 2023-03-27 MED ORDER — PHENOL 1.4 % MT LIQD: 1.0000 | OROMUCOSAL | Status: DC | PRN

## 2023-03-27 MED ORDER — BUPIVACAINE HCL (PF) 0.25 % IJ SOLN
INTRAMUSCULAR | Status: AC
  Filled 2023-03-27: qty 30

## 2023-03-27 MED ORDER — ACETAMINOPHEN 500 MG PO TABS
1000.0000 mg | ORAL_TABLET | Freq: Once | ORAL | Status: AC
  Administered 2023-03-27: 1000 mg via ORAL
  Filled 2023-03-27: qty 2

## 2023-03-27 MED ORDER — INSULIN ASPART 100 UNIT/ML IJ SOLN: 0.0000 [IU] | INTRAMUSCULAR | Status: DC | PRN

## 2023-03-27 MED ORDER — THROMBIN 5000 UNITS EX SOLR: OROMUCOSAL | Status: DC | PRN

## 2023-03-27 MED ORDER — PHENYLEPHRINE HCL-NACL 20-0.9 MG/250ML-% IV SOLN: INTRAVENOUS | Status: DC | PRN

## 2023-03-27 MED ORDER — PHENYLEPHRINE 80 MCG/ML (10ML) SYRINGE FOR IV PUSH (FOR BLOOD PRESSURE SUPPORT)
PREFILLED_SYRINGE | INTRAVENOUS | Status: DC | PRN
  Administered 2023-03-27: 80 ug via INTRAVENOUS

## 2023-03-27 MED ORDER — METHOCARBAMOL 500 MG PO TABS
500.0000 mg | ORAL_TABLET | Freq: Four times a day (QID) | ORAL | Status: DC | PRN
  Administered 2023-03-27 – 2023-03-28 (×2): 500 mg via ORAL
  Filled 2023-03-27 (×2): qty 1

## 2023-03-27 MED ORDER — AMISULPRIDE (ANTIEMETIC) 5 MG/2ML IV SOLN
10.0000 mg | Freq: Once | INTRAVENOUS | Status: AC | PRN
  Administered 2023-03-27: 10 mg via INTRAVENOUS

## 2023-03-27 MED ORDER — KCL IN DEXTROSE-NACL 10-5-0.45 MEQ/L-%-% IV SOLN
INTRAVENOUS | Status: DC
  Filled 2023-03-27: qty 1000

## 2023-03-27 MED ORDER — TAMSULOSIN HCL 0.4 MG PO CAPS: 0.4000 mg | ORAL_CAPSULE | Freq: Every day | ORAL | Status: DC | PRN

## 2023-03-27 MED ORDER — BUPIVACAINE HCL (PF) 0.25 % IJ SOLN
INTRAMUSCULAR | Status: DC | PRN
  Administered 2023-03-27: 8 mL

## 2023-03-27 MED ORDER — METHOCARBAMOL 1000 MG/10ML IJ SOLN: 500.0000 mg | Freq: Four times a day (QID) | INTRAMUSCULAR | Status: DC | PRN

## 2023-03-27 MED ORDER — THROMBIN 20000 UNITS EX SOLR
CUTANEOUS | Status: AC
  Filled 2023-03-27: qty 20000

## 2023-03-27 MED ORDER — OXYCODONE HCL 5 MG PO TABS
10.0000 mg | ORAL_TABLET | ORAL | Status: DC | PRN
  Administered 2023-03-27 – 2023-03-28 (×2): 10 mg via ORAL
  Filled 2023-03-27 (×2): qty 2

## 2023-03-27 MED ORDER — AMISULPRIDE (ANTIEMETIC) 5 MG/2ML IV SOLN
INTRAVENOUS | Status: AC
  Filled 2023-03-27: qty 4

## 2023-03-27 MED ORDER — METFORMIN HCL 500 MG PO TABS
1000.0000 mg | ORAL_TABLET | Freq: Every day | ORAL | Status: DC
  Administered 2023-03-28: 1000 mg via ORAL
  Filled 2023-03-27: qty 2

## 2023-03-27 MED ORDER — PROPOFOL 10 MG/ML IV BOLUS
INTRAVENOUS | Status: DC | PRN
  Administered 2023-03-27: 130 mg via INTRAVENOUS

## 2023-03-27 MED ORDER — ONDANSETRON HCL 4 MG/2ML IJ SOLN
INTRAMUSCULAR | Status: DC | PRN
  Administered 2023-03-27: 4 mg via INTRAVENOUS

## 2023-03-27 MED ORDER — FENTANYL CITRATE (PF) 100 MCG/2ML IJ SOLN
INTRAMUSCULAR | Status: AC
  Filled 2023-03-27: qty 2

## 2023-03-27 MED ORDER — ADULT MULTIVITAMIN W/MINERALS CH
2.0000 | ORAL_TABLET | Freq: Every morning | ORAL | Status: DC
  Administered 2023-03-28: 2 via ORAL
  Filled 2023-03-27: qty 2

## 2023-03-27 MED ORDER — ONDANSETRON HCL 4 MG/2ML IJ SOLN: 4.0000 mg | Freq: Four times a day (QID) | INTRAMUSCULAR | Status: DC | PRN

## 2023-03-27 MED ORDER — DEXAMETHASONE SODIUM PHOSPHATE 10 MG/ML IJ SOLN
INTRAMUSCULAR | Status: DC | PRN
  Administered 2023-03-27: 4 mg via INTRAVENOUS

## 2023-03-27 MED ORDER — SENNA 8.6 MG PO TABS
1.0000 | ORAL_TABLET | Freq: Two times a day (BID) | ORAL | Status: DC
  Administered 2023-03-27 – 2023-03-28 (×2): 8.6 mg via ORAL
  Filled 2023-03-27 (×2): qty 1

## 2023-03-27 MED ORDER — INSULIN ASPART 100 UNIT/ML IJ SOLN
0.0000 [IU] | Freq: Three times a day (TID) | INTRAMUSCULAR | Status: DC
Start: 2023-03-28 — End: 2023-03-28
  Administered 2023-03-28: 2 [IU] via SUBCUTANEOUS

## 2023-03-27 MED ORDER — ALBUMIN HUMAN 5 % IV SOLN: INTRAVENOUS | Status: DC | PRN

## 2023-03-27 MED ORDER — GABAPENTIN 300 MG PO CAPS
300.0000 mg | ORAL_CAPSULE | ORAL | Status: AC
  Administered 2023-03-27: 300 mg via ORAL
  Filled 2023-03-27: qty 1

## 2023-03-27 MED ORDER — THROMBIN 20000 UNITS EX SOLR: CUTANEOUS | Status: DC | PRN

## 2023-03-27 MED ORDER — FERROUS SULFATE 325 (65 FE) MG PO TABS
325.0000 mg | ORAL_TABLET | Freq: Every day | ORAL | Status: DC
  Administered 2023-03-27 – 2023-03-28 (×2): 325 mg via ORAL
  Filled 2023-03-27 (×2): qty 1

## 2023-03-27 MED ORDER — SODIUM CHLORIDE 0.9 % IV SOLN
250.0000 mL | INTRAVENOUS | Status: DC
  Administered 2023-03-27: 250 mL via INTRAVENOUS

## 2023-03-27 MED ORDER — THROMBIN 5000 UNITS EX SOLR
CUTANEOUS | Status: AC
  Filled 2023-03-27: qty 5000

## 2023-03-27 MED ORDER — ROCURONIUM BROMIDE 10 MG/ML (PF) SYRINGE
PREFILLED_SYRINGE | INTRAVENOUS | Status: DC | PRN
  Administered 2023-03-27: 50 mg via INTRAVENOUS

## 2023-03-27 MED ORDER — MENTHOL 3 MG MT LOZG
1.0000 | LOZENGE | OROMUCOSAL | Status: DC | PRN
Start: 2023-03-27 — End: 2023-03-28

## 2023-03-27 SURGICAL SUPPLY — 63 items
ADH SKN CLS APL DERMABOND .7 (GAUZE/BANDAGES/DRESSINGS) ×1
APL SKNCLS STERI-STRIP NONHPOA (GAUZE/BANDAGES/DRESSINGS) ×1
BAG COUNTER SPONGE SURGICOUNT (BAG) ×1 IMPLANT
BAG SPNG CNTER NS LX DISP (BAG) ×1
BASKET BONE COLLECTION (BASKET) IMPLANT
BENZOIN TINCTURE PRP APPL 2/3 (GAUZE/BANDAGES/DRESSINGS) ×1 IMPLANT
BLADE BONE MILL MEDIUM (MISCELLANEOUS) IMPLANT
BLADE CLIPPER SURG (BLADE) IMPLANT
BUR CARBIDE MATCH 3.0 (BURR) ×1 IMPLANT
CANISTER SUCT 3000ML PPV (MISCELLANEOUS) ×1 IMPLANT
CNTNR URN SCR LID CUP LEK RST (MISCELLANEOUS) ×1 IMPLANT
CONT SPEC 4OZ STRL OR WHT (MISCELLANEOUS) ×1
COVER BACK TABLE 60X90IN (DRAPES) ×1 IMPLANT
DERMABOND ADVANCED .7 DNX12 (GAUZE/BANDAGES/DRESSINGS) IMPLANT
DRAPE C-ARM 42X72 X-RAY (DRAPES) IMPLANT
DRAPE LAPAROTOMY 100X72X124 (DRAPES) ×1 IMPLANT
DRAPE SURG 17X23 STRL (DRAPES) ×1 IMPLANT
DRSG OPSITE POSTOP 4X6 (GAUZE/BANDAGES/DRESSINGS) IMPLANT
DRSG OPSITE POSTOP 4X8 (GAUZE/BANDAGES/DRESSINGS) IMPLANT
DURAPREP 26ML APPLICATOR (WOUND CARE) ×1 IMPLANT
ELECT REM PT RETURN 9FT ADLT (ELECTROSURGICAL) ×1
ELECTRODE REM PT RTRN 9FT ADLT (ELECTROSURGICAL) ×1 IMPLANT
EVACUATOR 1/8 PVC DRAIN (DRAIN) IMPLANT
FIBER BONE ALLOSYNC EXPAND 10 (Bone Implant) IMPLANT
GAUZE 4X4 16PLY ~~LOC~~+RFID DBL (SPONGE) IMPLANT
GLOVE BIO SURGEON STRL SZ7 (GLOVE) IMPLANT
GLOVE BIO SURGEON STRL SZ8 (GLOVE) ×2 IMPLANT
GLOVE BIOGEL PI IND STRL 7.0 (GLOVE) IMPLANT
GOWN STRL REUS W/ TWL LRG LVL3 (GOWN DISPOSABLE) IMPLANT
GOWN STRL REUS W/ TWL XL LVL3 (GOWN DISPOSABLE) ×2 IMPLANT
GOWN STRL REUS W/TWL 2XL LVL3 (GOWN DISPOSABLE) IMPLANT
GOWN STRL REUS W/TWL LRG LVL3 (GOWN DISPOSABLE)
GOWN STRL REUS W/TWL XL LVL3 (GOWN DISPOSABLE) ×2
GRAFT BN 10X1XDBM MAGNIFUSE (Bone Implant) IMPLANT
GRAFT BONE MAGNIFUSE 1X10CM (Bone Implant) ×1 IMPLANT
HEMOSTAT POWDER KIT SURGIFOAM (HEMOSTASIS) IMPLANT
KIT BASIN OR (CUSTOM PROCEDURE TRAY) ×1 IMPLANT
KIT INFUSE SMALL (Orthopedic Implant) IMPLANT
KIT TURNOVER KIT B (KITS) ×1 IMPLANT
MILL BONE PREP (MISCELLANEOUS) IMPLANT
NDL HYPO 25X1 1.5 SAFETY (NEEDLE) ×1 IMPLANT
NEEDLE HYPO 25X1 1.5 SAFETY (NEEDLE) ×1 IMPLANT
NS IRRIG 1000ML POUR BTL (IV SOLUTION) ×1 IMPLANT
PACK LAMINECTOMY NEURO (CUSTOM PROCEDURE TRAY) ×1 IMPLANT
PAD ARMBOARD 7.5X6 YLW CONV (MISCELLANEOUS) ×3 IMPLANT
ROD LORD LIPPED TI 5.5X75 (Rod) IMPLANT
SCREW CANC SHANK MOD 6.5X45 (Screw) IMPLANT
SCREW CORT SHANK MOD 6.5X40 (Screw) IMPLANT
SCREW POLYAXIAL TULIP (Screw) IMPLANT
SET SCREW (Screw) ×6 IMPLANT
SET SCREW SPNE (Screw) IMPLANT
SOLUTION IRRIG SURGIPHOR (IV SOLUTION) ×1 IMPLANT
SPONGE SURGIFOAM ABS GEL 100 (HEMOSTASIS) ×1 IMPLANT
SPONGE T-LAP 4X18 ~~LOC~~+RFID (SPONGE) IMPLANT
STRIP CLOSURE SKIN 1/2X4 (GAUZE/BANDAGES/DRESSINGS) ×2 IMPLANT
SUT VIC AB 0 CT1 18XCR BRD8 (SUTURE) ×1 IMPLANT
SUT VIC AB 0 CT1 8-18 (SUTURE) ×1
SUT VIC AB 2-0 CP2 18 (SUTURE) ×1 IMPLANT
SUT VIC AB 3-0 SH 8-18 (SUTURE) ×2 IMPLANT
TOWEL GREEN STERILE (TOWEL DISPOSABLE) ×1 IMPLANT
TOWEL GREEN STERILE FF (TOWEL DISPOSABLE) ×1 IMPLANT
TRAY FOLEY MTR SLVR 16FR STAT (SET/KITS/TRAYS/PACK) IMPLANT
WATER STERILE IRR 1000ML POUR (IV SOLUTION) ×1 IMPLANT

## 2023-03-27 NOTE — H&P (Signed)
Subjective: Patient is a 82 y.o. male admitted for back and left leg pain. Onset of symptoms was several months ago, gradually worsening since that time.  The pain is rated severe, and is located at the across the lower back and radiates to left leg. The pain is described as aching and occurs all day. The symptoms have been progressive. Symptoms are exacerbated by exercise, standing, and walking for more than a few minutes. 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   History of pancreatitis 07/12/2020   History of pancytopenia 07/12/2020   Hyperlipidemia, unspecified 07/12/2020   Irregular heart beat 07/12/2020   Left inguinal pain 12/26/2022   Long term (current) use of insulin (HCC) 07/12/2020   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   Lumbar radiculopathy 06/17/2020   Lumbar spondylosis 07/28/2020   Malignant neoplasm of prostate (HCC) 06/24/2019   Mood disorder (HCC) 07/12/2020   Pain of left hip joint 08/25/2019   Pancreatitis    2007   PONV (postoperative nausea and vomiting)    Prostate cancer (HCC)    S/P lumbar laminectomy 08/09/2021   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   Systolic murmur 07/12/2020   Type 2 diabetes mellitus with other specified complication (HCC) 07/12/2020    Past Surgical History:  Procedure Laterality Date   BACK SURGERY     lumbar surgery 20 years ago per patient   CATARACT EXTRACTION     CHOLECYSTECTOMY      20 years ago per patient   COLONOSCOPY     LUMBAR LAMINECTOMY/DECOMPRESSION MICRODISCECTOMY Bilateral 08/09/2021   Procedure: Lumbar decompressive laminectomy  - Lumbar two-Lumabr three - Lumbar three-Lumbar four - Lumbar four-Lumbar five - Lumabr five-Sacal one - Bilateral, Posterior Lateral Fusion Lumbar three-four-Right;  Surgeon: Tia Alert, MD;  Location: [facility];  Service: Neurosurgery;  Laterality: Bilateral;   TONSILLECTOMY     as a kid    Prior to Admission medications   Medication Sig Start Date End Date Taking? Authorizing Provider  acetaminophen (TYLENOL) 500 MG tablet Take 500-1,000 mg by mouth every 6 (six) hours as needed for moderate pain (pain score 4-6).   Yes [provider]  atorvastatin (LIPITOR) 20 MG tablet Take 20 mg by mouth every morning. 11/18/18  Yes [provider]  CALCIUM PO Take 2 tablets by mouth every morning.   Yes [provider]  diazepam (VALIUM) 5 MG tablet Take 1 tablet (5 mg total) by mouth at bedtime as needed for anxiety (moods,and muscle spasm). 10/31/22  Yes Rodolph Bong, MD  ferrous sulfate 325 (65 FE) MG tablet Take 325 mg by mouth daily. 03/27/21  Yes Ladene Artist, MD  insulin glargine (LANTUS SOLOSTAR) 100 UNIT/ML Solostar Pen Inject 32 Units into the skin daily before breakfast.   Yes [provider]  Krill Oil 500 MG CAPS Take 1,000 mg by mouth every morning.   Yes [provider]  losartan (COZAAR) 100 MG tablet Take 100 mg by mouth daily. 01/29/21  Yes [provider]  metFORMIN (GLUCOPHAGE) 500 MG  tablet Take 1,000 mg by mouth 2 (two) times a day. 11/18/18  Yes [provider]  Multiple Vitamin (MULTIVITAMIN WITH MINERALS) TABS tablet Take 2 tablets by mouth every morning.   Yes [provider]  Testosterone 1.62 % GEL Apply 1 Pump topically every morning. 08/08/22  Yes [provider]  tamsulosin (FLOMAX) 0.4 MG CAPS capsule Take 0.4 mg by mouth daily as needed  (infrequent urination). 11/05/20   [provider]   Allergies  Allergen Reactions   Lisinopril Cough    Social History   Tobacco Use   Smoking status: Never   Smokeless tobacco: Never  Substance Use Topics   Alcohol use: Not Currently    Family History  Problem Relation Age of Onset   Lupus Mother      Review of Systems  Positive ROS: neg  All other systems have been reviewed and were otherwise negative with the exception of those mentioned in the HPI and as above.  Objective: Vital signs in last 24 hours: Temp:  [98.9 F (37.2 C)] 98.9 F (37.2 C) (11/06 1000) Resp:  [18] 18 (11/06 1000) BP: (155)/(82) 155/82 (11/06 1000) SpO2:  [100 %] 100 % (11/06 1000) Weight:  [78 kg] 78 kg (11/06 1000)  General Appearance: Alert, cooperative, no distress, appears stated age Head: Normocephalic, without obvious abnormality, atraumatic Eyes: PERRL, conjunctiva/corneas clear, EOM's intact    Neck: Supple, symmetrical, trachea midline Back: Symmetric, no curvature, ROM normal, no CVA tenderness Lungs:  respirations unlabored Heart: Regular rate and rhythm Abdomen: Soft, non-tender Extremities: Extremities normal, atraumatic, no cyanosis or edema Pulses: 2+ and symmetric all extremities Skin: Skin color, texture, turgor normal, no rashes or lesions  NEUROLOGIC:   Mental status: Alert and oriented x4,  no aphasia, good attention span, fund of knowledge, and memory Motor Exam - grossly normal Sensory Exam - grossly normal Reflexes: 1+ Coordination - grossly normal Gait - grossly normal Balance - grossly normal Cranial Nerves: I: smell Not tested  II: visual acuity  OS: nl    OD: nl  II: visual fields Full to confrontation  II: pupils Equal, round, reactive to light  III,VII: ptosis None  III,IV,VI: extraocular muscles  Full ROM  V: mastication Normal  V: facial light touch sensation  Normal  V,VII: corneal reflex  Present  VII: facial muscle function - upper   Normal  VII: facial muscle function - lower Normal  VIII: hearing Not tested  IX: soft palate elevation  Normal  IX,X: gag reflex Present  XI: trapezius strength  5/5  XI: sternocleidomastoid strength 5/5  XI: neck flexion strength  5/5  XII: tongue strength  Normal    Data Review Lab Results  Component Value Date   WBC 4.8 12/20/2022   HGB 12.0 (L) 12/20/2022   HCT 36.7 (L) 12/20/2022   MCV 94.1 12/20/2022   PLT 178 12/20/2022   Lab Results  Component Value Date   NA 140 02/07/2023   K 4.4 02/07/2023   CL 99 02/07/2023   CO2 24 02/07/2023   BUN 16 02/07/2023   CREATININE 1.05 02/07/2023   GLUCOSE 143 (H) 02/07/2023   Lab Results  Component Value Date   INR 1.1 03/18/2023    Assessment/Plan:  Estimated body mass index is 24.68 kg/m as calculated from the following:   Height as of this encounter: 5\' 10"  (1.778 m).   Weight as of this encounter: 78 kg. Patient admitted for L2-3 L3-4 decompression and instrumented fusion. Patient has failed  a reasonable attempt at conservative therapy.  I explained the condition and procedure to the patient and answered any questions.  Patient wishes to proceed with procedure as planned. Understands risks/ benefits and typical outcomes of procedure.   Tia Alert 03/27/2023 11:17 AM

## 2023-03-27 NOTE — Transfer of Care (Signed)
Immediate Anesthesia Transfer of Care Note  Patient: Benjamin Davila  Procedure(s) Performed: Left Lumbar Two-Three, Lumbar Three-Four Hemifacetectomy, posterolateral fusion Lumbar Two-Four, segmental fixation Lumbar Two-Four (Left: Spine Lumbar)  Patient Location: PACU  Anesthesia Type:General  Level of Consciousness: sedated  Airway & Oxygen Therapy: Patient connected to face mask oxygen  Post-op Assessment: Post -op Vital signs reviewed and stable  Post vital signs: Reviewed and stable  Last Vitals:  Vitals Value Taken Time  BP 138/74 03/27/23 1437  Temp    Pulse 76 03/27/23 1441  Resp 17 03/27/23 1441  SpO2 94 % 03/27/23 1441  Vitals shown include unfiled device data.  Last Pain:  Vitals:   03/27/23 1011  PainSc: 7       Patients Stated Pain Goal: 0 (03/27/23 1011)  Complications: No notable events documented.

## 2023-03-27 NOTE — Op Note (Signed)
03/27/2023  2:29 PM  PATIENT:  Benjamin Davila  82 y.o. male  PRE-OPERATIVE DIAGNOSIS: Foraminal stenosis L2-3 L3-4 left with a left lower extremity radiculopathy, spondylolisthesis L3-4, previous laminectomy L2-3 L3-4 L4-5  POST-OPERATIVE DIAGNOSIS:  same  PROCEDURE:   1. Decompressive lumbar laminectomy, hemi facetectomy and foraminotomies L2-3 L3-4 left  2. Posterior fixation L2-L4 inclusive using Alphatec cortical pedicle screws.   3. Intertransverse arthrodesis L2-L4 inclusive using morcellized autograft and allograft.  SURGEON:  Marikay Alar, MD  ASSISTANTSAdelene Idler FNP  ANESTHESIA:  General  EBL: 100 ml  Total I/O In: 250 [IV Piggyback:250] Out: 350 [Urine:250; Blood:100]  BLOOD ADMINISTERED:none  DRAINS: none   INDICATION FOR PROCEDURE: This patient presented with back and L leg pain. Imaging revealed spondylolisthesis with foraminal stenosis L2-3 l3-4. The patient tried a reasonable attempt at conservative medical measures without relief. I recommended decompression and instrumented fusion to address the stenosis as well as the segmental  instability.  Patient understood the risks, benefits, and alternatives and potential outcomes and wished to proceed.  PROCEDURE DETAILS:  The patient was brought to the operating room. After induction of generalized endotracheal anesthesia the patient was rolled into the prone position on chest rolls and all pressure points were padded. The patient's lumbar region was cleaned and then prepped with DuraPrep and draped in the usual sterile fashion. Anesthesia was injected and then a dorsal midline incision was made and carried down to the lumbosacral fascia. The fascia was opened and the paraspinous musculature was taken down in a subperiosteal fashion to expose L2-3 L3-4. A self-retaining retractor was placed. Intraoperative fluoroscopy confirmed my level, and I started with placement of the L2 L3 and L4 cortical pedicle screws. The pedicle  screw entry zones were identified utilizing surface landmarks and  AP and lateral fluoroscopy. I scored the cortex with the high-speed drill and then used the hand drill to drill an upward and outward direction into the pedicle. I then tapped line to line. I then placed a 6.5 x 45 mm pedicle screws cortical pedicle screw into the pedicles of L2, L3, and L4 bilaterally.    I then turned my attention to the decompression and complete lumbar laminectomies, hemi- facetectomies, and foraminotomies were performed at L2 3 and L3-4 on the left.  My nurse practitioner was directly involved in the decompression and exposure of the neural elements. Much more generous decompression and generous foraminotomy was undertaken in order to adequately decompress the neural elements and address the patient's leg pain.  I drilled across the pars at both levels and remove the inferior articular process.  What was left of the yellow ligament was removed to expose the underlying dura and nerve roots, and generous foraminotomies were performed to adequately decompress the neural elements. Both the exiting and traversing nerve roots were decompressed on both sides until a coronary dilator passed easily along the nerve roots.   We then decorticated the transverse processes of L2-L4 bilaterally and laid a mixture of morcellized autograft and allograft out over these to perform intertransverse arthrodesis at L2-L3 and L4 bilaterally. We then placed lordotic rods into the multiaxial screw heads of the pedicle screws and locked these in position with the locking caps and anti-torque device. We then checked our construct with AP and lateral fluoroscopy. Irrigated with copious amounts of 0.5% povidone iodine solution followed by saline solution. Inspected the nerve roots once again to assure adequate decompression, lined to the dura with Gelfoam,  and then we closed the muscle and  the fascia with 0 Vicryl. Closed the subcutaneous tissues with  2-0 Vicryl and subcuticular tissues with 3-0 Vicryl. The skin was closed with benzoin and Steri-Strips. Dressing was then applied, the patient was awakened from general anesthesia and transported to the recovery room in stable condition. At the end of the procedure all sponge, needle and instrument counts were correct.   PLAN OF CARE: admit to inpatient  PATIENT DISPOSITION:  PACU - hemodynamically stable.   Delay start of Pharmacological VTE agent (>24hrs) due to surgical blood loss or risk of bleeding:  yes

## 2023-03-27 NOTE — Anesthesia Procedure Notes (Signed)
Procedure Name: Intubation Date/Time: 03/27/2023 11:57 AM  Performed by: Hessie Diener, CRNAPre-anesthesia Checklist: Patient identified, Emergency Drugs available, Suction available and Patient being monitored Patient Re-evaluated:Patient Re-evaluated prior to induction Oxygen Delivery Method: Circle System Utilized Preoxygenation: Pre-oxygenation with 100% oxygen Induction Type: IV induction Ventilation: Mask ventilation without difficulty Tube type: Oral Tube size: 7.0 mm Number of attempts: 1 Airway Equipment and Method: Stylet and Oral airway Placement Confirmation: ETT inserted through vocal cords under direct vision, positive ETCO2 and breath sounds checked- equal and bilateral Secured at: 21 cm Tube secured with: Tape Dental Injury: Teeth and Oropharynx as per pre-operative assessment

## 2023-03-28 ENCOUNTER — Encounter (HOSPITAL_COMMUNITY): Payer: Self-pay | Admitting: Neurological Surgery

## 2023-03-28 LAB — GLUCOSE, CAPILLARY
Glucose-Capillary: 134 mg/dL — ABNORMAL HIGH (ref 70–99)
Glucose-Capillary: 112 mg/dL — ABNORMAL HIGH (ref 70–99)

## 2023-03-28 MED ORDER — METHOCARBAMOL 750 MG PO TABS: 750.0000 mg | ORAL_TABLET | Freq: Four times a day (QID) | ORAL | 0 refills | Status: DC

## 2023-03-28 MED ORDER — OXYCODONE-ACETAMINOPHEN 5-325 MG PO TABS: 1.0000 | ORAL_TABLET | ORAL | 0 refills | Status: DC | PRN

## 2023-03-28 MED ORDER — TAMSULOSIN HCL 0.4 MG PO CAPS
0.4000 mg | ORAL_CAPSULE | Freq: Once | ORAL | Status: AC
  Administered 2023-03-28: 0.4 mg via ORAL
  Filled 2023-03-28: qty 1

## 2023-03-28 NOTE — Anesthesia Postprocedure Evaluation (Signed)
Anesthesia Post Note  Patient: Benjamin Davila  Procedure(s) Performed: Left Lumbar Two-Three, Lumbar Three-Four Hemifacetectomy, posterolateral fusion Lumbar Two-Four, segmental fixation Lumbar Two-Four (Left: Spine Lumbar)     Patient location during evaluation: PACU Anesthesia Type: General Level of consciousness: awake and alert Pain management: pain level controlled Vital Signs Assessment: post-procedure vital signs reviewed and stable Respiratory status: spontaneous breathing, nonlabored ventilation, respiratory function stable and patient connected to nasal cannula oxygen Cardiovascular status: blood pressure returned to baseline and stable Postop Assessment: no apparent nausea or vomiting Anesthetic complications: no   There were no known notable events for this encounter.  Last Vitals:  Vitals:   03/28/23 0259 03/28/23 0758  BP: 113/65 104/60  Pulse: 73 70  Resp: 18 17  Temp: 36.5 C 36.5 C  SpO2: 96% 98%    Last Pain:  Vitals:   03/28/23 0758  TempSrc: Oral  PainSc:    Pain Goal: Patients Stated Pain Goal: 2 (03/28/23 0557)                 Kennieth Rad

## 2023-03-28 NOTE — Discharge Summary (Signed)
Physician Discharge Summary  Patient ID: Benjamin Davila MRN: [id number] DOB/AGE: 1940/11/16 82 y.o.  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 The patient tolerated the procedure well and was taken to the recovery room and then to the floor in stable condition. The hospital course was routine. There were no complications. The wound remained clean dry and intact. Pt had appropriate back soreness. No complaints of leg pain or new N/T/W. The patient remained afebrile with stable vital signs, and tolerated a regular diet. The patient continued to increase activities, and pain was well controlled with oral pain medications.   Consults: None  Significant Diagnostic Studies:  Results for orders placed or performed during the hospital encounter of 03/27/23  Glucose, capillary  Result Value Ref Range   Glucose-Capillary 135 (H) 70 - 99 mg/dL   Comment 1 Notify RN   Glucose, capillary  Result Value Ref Range   Glucose-Capillary 122 (H) 70 - 99 mg/dL  Glucose, capillary  Result Value Ref Range   Glucose-Capillary 273 (H) 70 - 99 mg/dL   Comment 1 Notify RN    Comment 2 Document in Chart   Glucose, capillary  Result Value Ref Range   Glucose-Capillary 134 (H) 70 - 99 mg/dL    DG Lumbar Spine 2-3 Views  Result Date: 03/27/2023 CLINICAL DATA:  Elective surgery. EXAM: LUMBAR SPINE - 2-3 VIEW COMPARISON:  Preoperative imaging FINDINGS: Three fluoroscopic spot views of the lumbar spine obtained in the operating room. Posterior rod with intrapedicular screw fusion and likely bone grafting material L2 through L4.  fluoroscopy time 1 minutes 5 seconds. Dose 36.82 mGy. IMPRESSION: Intraoperative fluoroscopy during lumbar fusion. Electronically Signed   By: Narda Rutherford M.D.   On: 03/27/2023 16:02   DG C-Arm 1-60 Min-No Report  Result Date: 03/27/2023 Fluoroscopy was utilized by the requesting physician.  No radiographic interpretation.   DG C-Arm 1-60 Min-No Report  Result Date: 03/27/2023 Fluoroscopy was utilized by the requesting physician.  No radiographic interpretation.   DG C-Arm 1-60 Min-No Report  Result Date: 03/27/2023 Fluoroscopy was utilized by the requesting physician.  No radiographic interpretation.    Antibiotics:  Anti-infectives (From admission, onward)    Start     Dose/Rate Route Frequency Ordered Stop   03/27/23 2000  ceFAZolin (ANCEF) IVPB 2g/100 mL premix        2 g 200 mL/hr over 30 Minutes Intravenous Every 8 hours 03/27/23 1818 03/28/23 0328   03/27/23 1000  ceFAZolin (ANCEF) IVPB 2g/100 mL premix        2 g 200 mL/hr over 30 Minutes Intravenous On call to O.R. 03/27/23 0957 03/27/23 1203       Discharge Exam: Blood pressure 113/65, pulse 73, temperature 97.7 F (36.5 C), temperature source Oral, resp. rate 18, height 5\' 10"  (1.778 m), weight 78 kg, SpO2 96%. Neurologic: Grossly normal Ambulating and voiding well incision cdi   Discharge Medications:   Allergies as of 03/28/2023       Reactions   Lisinopril Cough        Medication List     TAKE these medications    acetaminophen 500 MG tablet Commonly known as: TYLENOL Take  500-1,000 mg by mouth every 6 (six) hours as needed for moderate pain (pain score 4-6).   atorvastatin 20 MG tablet Commonly known as: LIPITOR Take 20 mg by mouth every morning.   CALCIUM PO Take 2 tablets by mouth every morning.   diazepam 5 MG tablet Commonly known as: VALIUM Take 1 tablet (5 mg total) by mouth at bedtime as needed for anxiety (moods,and muscle spasm).   ferrous sulfate 325 (65 FE) MG tablet Take  325 mg by mouth daily.   Krill Oil 500 MG Caps Take 1,000 mg by mouth every morning.   Lantus SoloStar 100 UNIT/ML Solostar Pen Generic drug: insulin glargine Inject 32 Units into the skin daily before breakfast.   losartan 100 MG tablet Commonly known as: COZAAR Take 100 mg by mouth daily.   metFORMIN 500 MG tablet Commonly known as: GLUCOPHAGE Take 1,000 mg by mouth 2 (two) times a day.   methocarbamol 750 MG tablet Commonly known as: Robaxin-750 Take 1 tablet (750 mg total) by mouth 4 (four) times daily.   multivitamin with minerals Tabs tablet Take 2 tablets by mouth every morning.   oxyCODONE-acetaminophen 5-325 MG tablet Commonly known as: PERCOCET/ROXICET Take 1 tablet by mouth every 4 (four) hours as needed for severe pain (pain score 7-10).   tamsulosin 0.4 MG Caps capsule Commonly known as: FLOMAX Take 0.4 mg by mouth daily as needed (infrequent urination).   Testosterone 1.62 % Gel Apply 1 Pump topically every morning.               Durable Medical Equipment  (From admission, onward)           Start     Ordered   03/27/23 1819  DME Walker rolling  Once       Question:  Patient needs a walker to treat with the following condition  Answer:  S/P lumbar fusion   03/27/23 1818   03/27/23 1819  DME 3 n 1  Once        03/27/23 1818            Disposition: home    Final Dx: left redo hemilaminectomy and facetectomy with posterior lateral fusion L2-3, L3-4  Discharge Instructions      Remove dressing in 72 hours   Complete by: As directed    Call MD for:   Complete by: As directed    Call MD for:  difficulty breathing, headache or visual disturbances   Complete by: As directed    Call MD for:  hives   Complete by: As directed    Call MD for:  persistant dizziness or light-headedness   Complete by: As directed    Call MD for:  persistant nausea and vomiting   Complete by: As directed    Call MD for:  redness, tenderness, or signs of  infection (pain, swelling, redness, odor or green/yellow discharge around incision site)   Complete by: As directed    Call MD for:  severe uncontrolled pain   Complete by: As directed    Call MD for:  temperature >100.4   Complete by: As directed    Diet - low sodium heart healthy   Complete by: As directed    Driving Restrictions   Complete by: As directed    No driving for 2 weeks, no riding in the car for 1 week   Increase activity slowly   Complete by: As directed    Lifting restrictions   Complete by: As  directed    No lifting more than 8 lbs          Signed: Tiana Loft [facility] 03/28/2023, 7:46 AM

## 2023-03-28 NOTE — Progress Notes (Signed)
Patient alert and oriented, mae's well, voiding adequate amount of urine, swallowing without difficulty, no c/o pain at time of discharge. Patient discharged home with family. Script and discharged instructions given to patient. Patient and family stated understanding of instructions given. Patient has an appointment with Dr. Lineback °

## 2023-03-28 NOTE — Evaluation (Signed)
Occupational Therapy Evaluation Patient Details Name: Benjamin Davila MRN: [id number] DOB: April 08, 1941 Today's Date: 03/28/2023   History of Present Illness Pt is an 82 y/o male who presents s/p L2-L4 redo hemilaminectomy on the L on  03/27/2023. PMH significant for DM2, HTN, and prostate cancer.   Clinical Impression   Pt admitted for above, educated pt on POB precautions and compensatory strategies to use to maintain back precautions during ADL completion. Pt needing min A to help with LBD due to decreased LLE hip mobility, reviewed bed propping strategy to allow for more independence. Pt CGA with log rolls and OOB mobiltity, needs cues to stay close to RW and maintain upright posture. Pt would benefit from continued acute skilled OT services to address deficits and help transition to next level of care. Anticipate no post acute OT needed.       If plan is discharge home, recommend the following: A little help with bathing/dressing/bathroom;Assistance with cooking/housework    Functional Status Assessment  Patient has had a recent decline in their functional status and demonstrates the ability to make significant improvements in function in a reasonable and predictable amount of time.  Equipment Recommendations  None recommended by OT (pt has rec DME)    Recommendations for Other Services       Precautions / Restrictions Precautions Precautions: Back;Fall Precaution Booklet Issued: Yes (comment) Precaution Comments: Reviewed handout Required Braces or Orthoses: Spinal Brace Spinal Brace: Lumbar corset;Applied in sitting position Restrictions Weight Bearing Restrictions: No      Mobility Bed Mobility Overal bed mobility: Needs Assistance Bed Mobility: Rolling, Sidelying to Sit Rolling: Contact guard assist Sidelying to sit: Contact guard assist       General bed mobility comments: step by step instructions to sequence log roll    Transfers Overall transfer level: Needs  assistance Equipment used: Rolling walker (2 wheels) Transfers: Sit to/from Stand Sit to Stand: Contact guard assist                  Balance Overall balance assessment: Needs assistance Sitting-balance support: Feet supported, No upper extremity supported Sitting balance-Leahy Scale: Fair     Standing balance support: No upper extremity supported, During functional activity, Reliant on assistive device for balance Standing balance-Leahy Scale: Poor                             ADL either performed or assessed with clinical judgement   ADL Overall ADL's : Needs assistance/impaired Eating/Feeding: Independent;Sitting   Grooming: Standing;Contact guard assist   Upper Body Bathing: Sitting;Supervision/ safety;Set up   Lower Body Bathing: Sitting/lateral leans;Contact guard assist   Upper Body Dressing : Sitting;Set up Upper Body Dressing Details (indicate cue type and reason): donned robe Lower Body Dressing: Sitting/lateral leans;Minimal assistance Lower Body Dressing Details (indicate cue type and reason): Pt needing assist to don garmnets over LLE due to decreased hip mobility, acheives figure four technique with RLE. Discussed use to body mechanics and bed with LLE propping to assist with donning lower body garments Toilet Transfer: Contact guard assist;Rolling walker (2 wheels)   Toileting- Clothing Manipulation and Hygiene: Contact guard assist;Sit to/from stand       Functional mobility during ADLs: Contact guard assist;Rolling walker (2 wheels) General ADL Comments: eduacted pt on sink strategies to use to maintain back precautions, educated pt on hip flexor strenghtening/AROM and stretch using modified figure four on bed to help with hip flexibility  Vision         Perception         Praxis         Pertinent Vitals/Pain Pain Assessment Pain Assessment: Faces Faces Pain Scale: Hurts little more Pain Location: back Pain Descriptors /  Indicators: Tightness Pain Intervention(s): Monitored during session, Limited activity within patient's tolerance, Repositioned     Extremity/Trunk Assessment Upper Extremity Assessment Upper Extremity Assessment: Overall WFL for tasks assessed   Lower Extremity Assessment Lower Extremity Assessment: LLE deficits/detail;Generalized weakness (Consistent with pre-op diagnosis)   Cervical / Trunk Assessment Cervical / Trunk Assessment: Back Surgery   Communication Communication Communication: No apparent difficulties Cueing Techniques: Verbal cues;Gestural cues   Cognition Arousal: Alert Behavior During Therapy: WFL for tasks assessed/performed Overall Cognitive Status: Within Functional Limits for tasks assessed                                       General Comments       Exercises     Shoulder Instructions      Home Living Family/patient expects to be discharged to:: Private residence Living Arrangements: Spouse/significant other Available Help at Discharge: Family;Available 24 hours/day Type of Home: House Home Access: Stairs to enter [employer] of Steps: 3 Entrance Stairs-Rails: Right Home Layout: Two level;Able to live on main level with bedroom/bathroom Alternate Level Stairs-Number of Steps: 10 + landing + 4 Alternate Level Stairs-Rails: Right Bathroom Shower/Tub: Producer, television/film/video: Standard     Home Equipment: Agricultural consultant (2 wheels);Adaptive equipment;BSC/3in1 Adaptive Equipment: Reacher Additional Comments: Pt reports he has a plastic chair that can act as his shower seat      Prior Functioning/Environment Prior Level of Function : Independent/Modified Independent             Mobility Comments: Mod I with RW ADLs Comments: Mod I        OT Problem List: Decreased knowledge of precautions;Decreased strength;Impaired balance (sitting and/or standing);Pain      OT Treatment/Interventions:  Self-care/ADL training;Balance training;Therapeutic exercise;Therapeutic activities;Patient/family education    OT Goals(Current goals can be found in the care plan section) Acute Rehab OT Goals Patient Stated Goal: To go home OT Goal Formulation: With patient Time For Goal Achievement: 04/11/23 Potential to Achieve Goals: Good ADL Goals Pt Will Perform Grooming: with modified independence;standing Pt Will Perform Lower Body Bathing: sitting/lateral leans;with modified independence Pt Will Perform Lower Body Dressing: sit to/from stand;with set-up Pt Will Transfer to Toilet: with modified independence;ambulating  OT Frequency: Min 1X/week    Co-evaluation              AM-PAC OT "6 Clicks" Daily Activity     Outcome Measure Help from another person eating meals?: None Help from another person taking care of personal grooming?: A Little Help from another person toileting, which includes using toliet, bedpan, or urinal?: A Little Help from another person bathing (including washing, rinsing, drying)?: A Little Help from another person to put on and taking off regular upper body clothing?: A Little Help from another person to put on and taking off regular lower body clothing?: A Little 6 Click Score: 19   End of Session Equipment Utilized During Treatment: Gait belt;Rolling walker (2 wheels);Back brace Nurse Communication: Mobility status  Activity Tolerance: Patient tolerated treatment well Patient left: Other (comment) (Pt in hall ambulating with PT to perfrom furhter assessment of functional capabilities)  OT Visit Diagnosis: Unsteadiness on feet (R26.81);Muscle weakness (generalized) (M62.81);Pain Pain - part of body:  (back)                Time: 9629-5284 OT Time Calculation (min): 38 min Charges:  OT General Charges $OT Visit: 1 Visit OT Evaluation $OT Eval Moderate Complexity: 1 Mod OT Treatments $Self Care/Home Management : 8-22 mins $Therapeutic Activity: 8-22  mins  03/28/2023  AB, OTR/L  Acute Rehabilitation Services  Office: [phone]   Tristan Schroeder 03/28/2023, 11:30 AM

## 2023-03-28 NOTE — Evaluation (Signed)
Physical Therapy Evaluation  Patient Details Name: Benjamin Davila MRN: [id number] DOB: 17-Feb-1941 Today's Date: 03/28/2023  History of Present Illness  Pt is an 82 y/o male who presents s/p L2-L4 redo hemilaminectomy on the L on  03/27/2023. PMH significant for DM2, HTN, and prostate cancer.   Clinical Impression  Pt admitted with above diagnosis. At the time of PT eval, pt was able to demonstrate transfers and ambulation with gross CGA and RW for support. Min assist provided on the stairs for support. Pt was educated on precautions, brace application/wearing schedule, appropriate activity progression, and car transfer. Pt currently with functional limitations due to the deficits listed below (see PT Problem List). Pt will benefit from skilled PT to increase their independence and safety with mobility to allow discharge to the venue listed below.          If plan is discharge home, recommend the following: A little help with walking and/or transfers;A little help with bathing/dressing/bathroom;Assistance with cooking/housework;Assist for transportation;Help with stairs or ramp for entrance   Can travel by private vehicle        Equipment Recommendations None recommended by PT  Recommendations for Other Services       Functional Status Assessment Patient has had a recent decline in their functional status and demonstrates the ability to make significant improvements in function in a reasonable and predictable amount of time.     Precautions / Restrictions Precautions Precautions: Back;Fall Precaution Booklet Issued: Yes (comment) Precaution Comments: Reviewed handout and pt was cued for precautions during functional mobility. Required Braces or Orthoses: Spinal Brace Spinal Brace: Lumbar corset;Applied in sitting position Restrictions Weight Bearing Restrictions: No      Mobility  Bed Mobility               General bed mobility comments: Verbally reviewed log roll  technique.    Transfers Overall transfer level: Needs assistance Equipment used: Rolling walker (2 wheels) Transfers: Sit to/from Stand Sit to Stand: Contact guard assist           General transfer comment: Hands on guarding provided for power up to full stand and for controlled lower to sit.    Ambulation/Gait Ambulation/Gait assistance: Contact guard assist Gait Distance (Feet): 200 Feet Assistive device: Rolling walker (2 wheels) Gait Pattern/deviations: Step-through pattern, Decreased stride length, Trunk flexed, Narrow base of support Gait velocity: Decreased Gait velocity interpretation: 1.31 - 2.62 ft/sec, indicative of limited community ambulator   General Gait Details: VC's for improved posture, closer walker proximity and forward gaze. No assist required.  Stairs Stairs: Yes Stairs assistance: Min assist Stair Management: One rail Right, Step to pattern, Forwards Number of Stairs: 10 General stair comments: VC's for sequencing and general safety. Assist for boost up to next step and for controlled lower.  Wheelchair Mobility     Tilt Bed    Modified Rankin (Stroke Patients Only)       Balance Overall balance assessment: Needs assistance Sitting-balance support: Feet supported, No upper extremity supported Sitting balance-Leahy Scale: Fair     Standing balance support: No upper extremity supported, During functional activity, Reliant on assistive device for balance Standing balance-Leahy Scale: Poor                               Pertinent Vitals/Pain Pain Assessment Pain Assessment: Faces Faces Pain Scale: Hurts little more Pain Location: back Pain Descriptors / Indicators: Tightness Pain Intervention(s): Limited activity within patient's  tolerance, Monitored during session, Repositioned    Home Living Family/patient expects to be discharged to:: Private residence Living Arrangements: Spouse/significant other Available Help at  Discharge: Family;Available 24 hours/day Type of Home: House Home Access: Stairs to enter Entrance Stairs-Rails: Right Entrance Stairs-Number of Steps: 3 (from garage) Alternate Level Stairs-Number of Steps: 10 + landing + 4 Home Layout: Two level;Able to live on main level with bedroom/bathroom Home Equipment: Rolling Walker (2 wheels);Adaptive equipment;BSC/3in1 Additional Comments: Pt reports he has a plastic chair that can act as his shower seat    Prior Function Prior Level of Function : Independent/Modified Independent             Mobility Comments: Mod I with RW ADLs Comments: Mod I     Extremity/Trunk Assessment   Upper Extremity Assessment Upper Extremity Assessment: Defer to OT evaluation    Lower Extremity Assessment Lower Extremity Assessment: Generalized weakness;LLE deficits/detail (Consistent with pre-op diagnosis)    Cervical / Trunk Assessment Cervical / Trunk Assessment: Back Surgery  Communication   Communication Communication: No apparent difficulties Cueing Techniques: Verbal cues;Gestural cues  Cognition Arousal: Alert Behavior During Therapy: WFL for tasks assessed/performed Overall Cognitive Status: Within Functional Limits for tasks assessed                                          General Comments      Exercises     Assessment/Plan    PT Assessment Patient needs continued PT services  PT Problem List Decreased strength;Decreased activity tolerance;Decreased mobility;Decreased balance;Decreased knowledge of use of DME;Decreased safety awareness;Decreased knowledge of precautions;Pain       PT Treatment Interventions DME instruction;Gait training;Stair training;Functional mobility training;Therapeutic activities;Therapeutic exercise;Balance training;Patient/family education    PT Goals (Current goals can be found in the Care Plan section)  Acute Rehab PT Goals Patient Stated Goal: Home today PT Goal Formulation:  With patient Time For Goal Achievement: 04/04/23 Potential to Achieve Goals: Good    Frequency Min 5X/week     Co-evaluation               AM-PAC PT "6 Clicks" Mobility  Outcome Measure Help needed turning from your back to your side while in a flat bed without using bedrails?: A Little Help needed moving from lying on your back to sitting on the side of a flat bed without using bedrails?: A Little Help needed moving to and from a bed to a chair (including a wheelchair)?: A Little Help needed standing up from a chair using your arms (e.g., wheelchair or bedside chair)?: A Little Help needed to walk in hospital room?: A Little Help needed climbing 3-5 steps with a railing? : A Little 6 Click Score: 18    End of Session Equipment Utilized During Treatment: Gait belt;Back brace Activity Tolerance: Patient tolerated treatment well Patient left: in bed;with call bell/phone within reach Nurse Communication: Mobility status PT Visit Diagnosis: Unsteadiness on feet (R26.81);Pain Pain - part of body:  (back)    Time: 5621-3086 PT Time Calculation (min) (ACUTE ONLY): 21 min   Charges:   PT Evaluation $PT Eval Low Complexity: 1 Low   PT General Charges $$ ACUTE PT VISIT: 1 Visit         Conni Slipper, PT, DPT Acute Rehabilitation Services Secure Chat Preferred Office: [phone]   Marylynn Pearson 03/28/2023, 10:49 AM

## 2023-04-05 ENCOUNTER — Telehealth: Payer: Self-pay

## 2023-04-05 NOTE — Telephone Encounter (Signed)
Patient called on 04/05/23 and wanted to cancel his upcoming appointment for labs, and an office visit with Dr. Truett Perna on 04/11/23. Stated he just recently had surgery and is not healing well. I offered to reschedule the patient and he stated he would call back to reschedule once he is better.

## 2023-04-10 NOTE — Telephone Encounter (Signed)
Telephone call  

## 2023-04-11 ENCOUNTER — Inpatient Hospital Stay: Payer: Medicare Other | Admitting: Oncology

## 2023-04-11 ENCOUNTER — Inpatient Hospital Stay: Payer: Medicare Other

## 2023-04-11 NOTE — Telephone Encounter (Signed)
Telephone call  

## 2023-07-26 IMAGING — DX DG HIP (WITH OR WITHOUT PELVIS) 2-3V*R*
3 series · 3 of 3 positions shown · non-contrast
Comparison: None

CLINICAL DATA: Right leg pain for 1 week, known known injury

EXAM:
DG HIP (WITH OR WITHOUT PELVIS) 2-3V RIGHT

[hip ap]
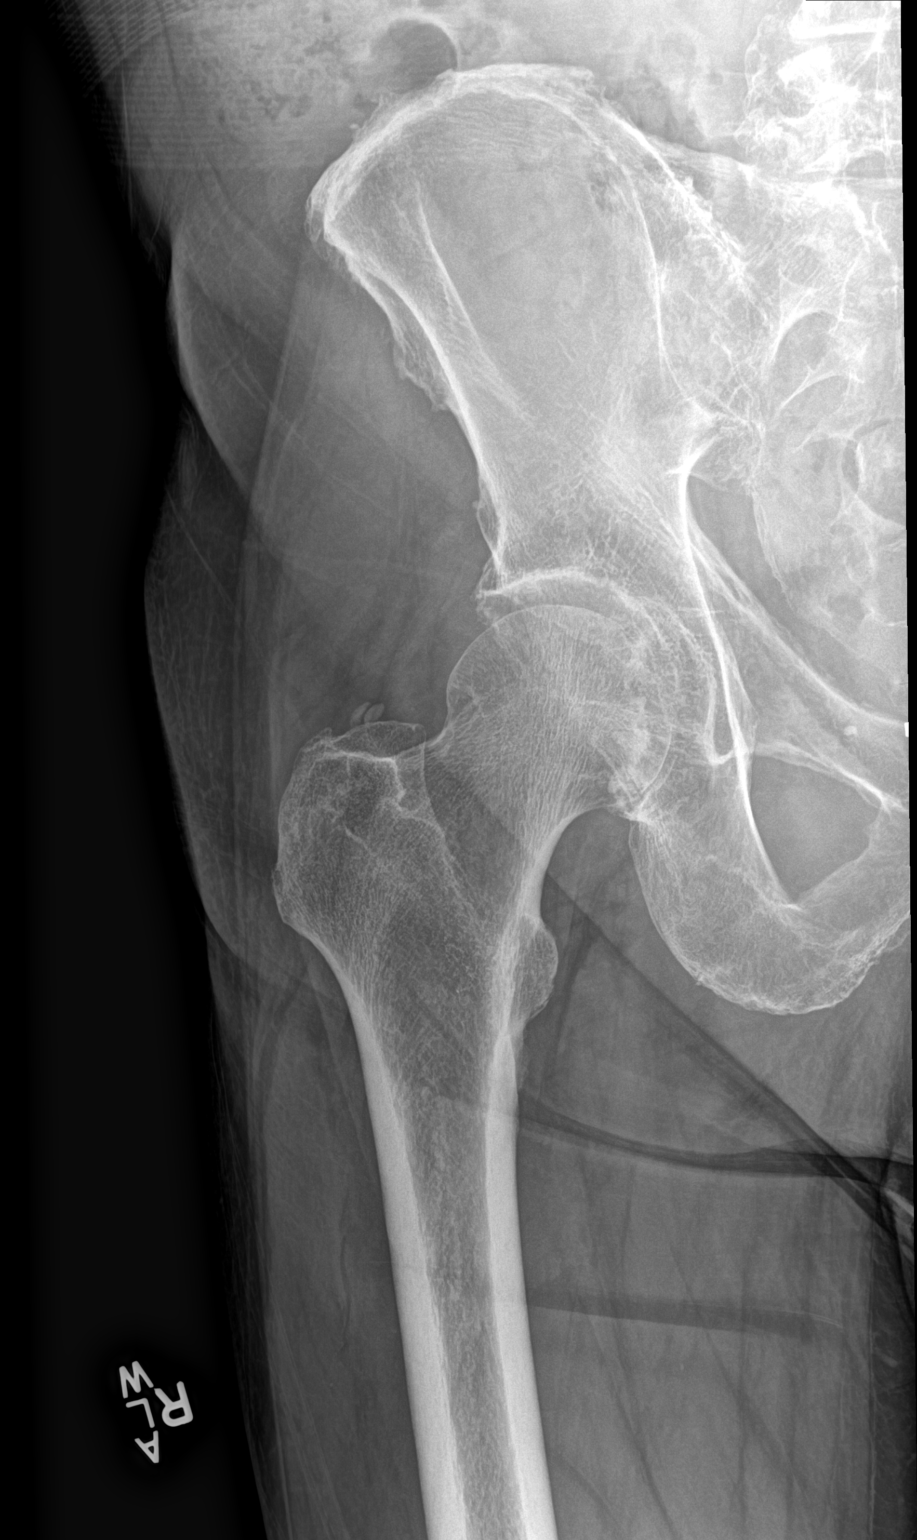

[hip lat]
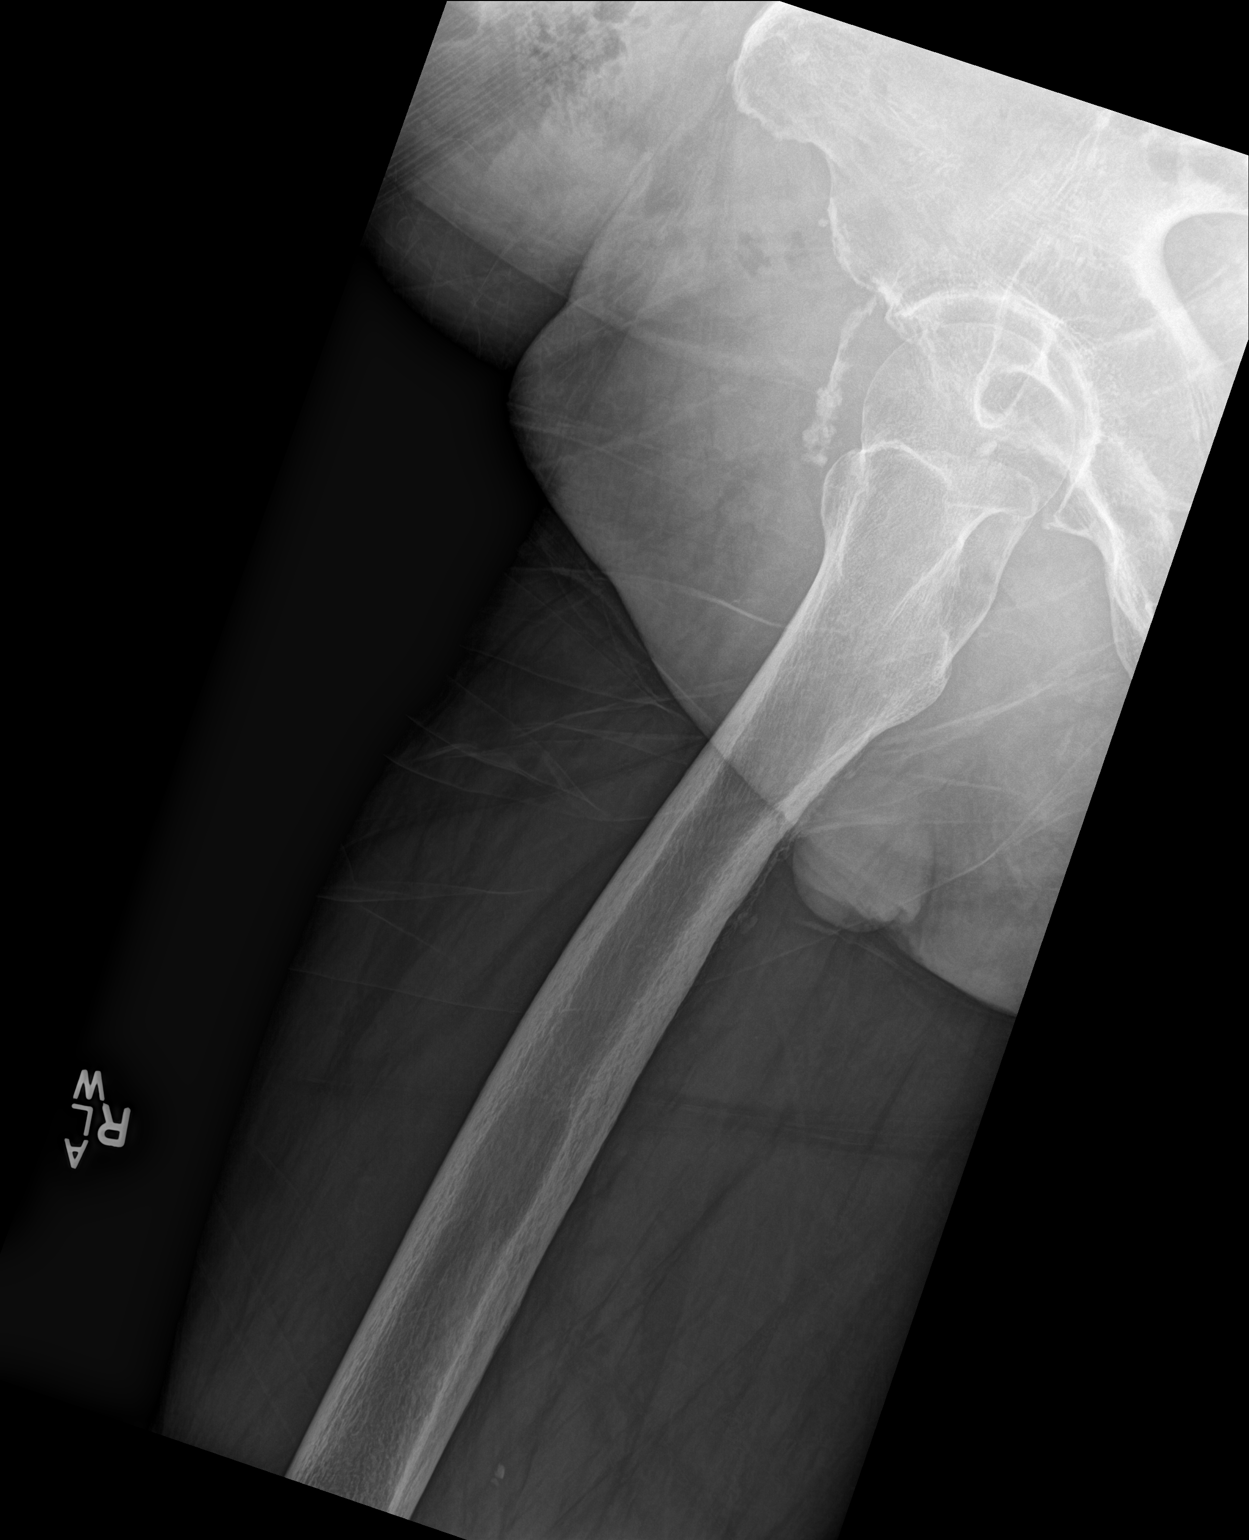

[pelvis ap]
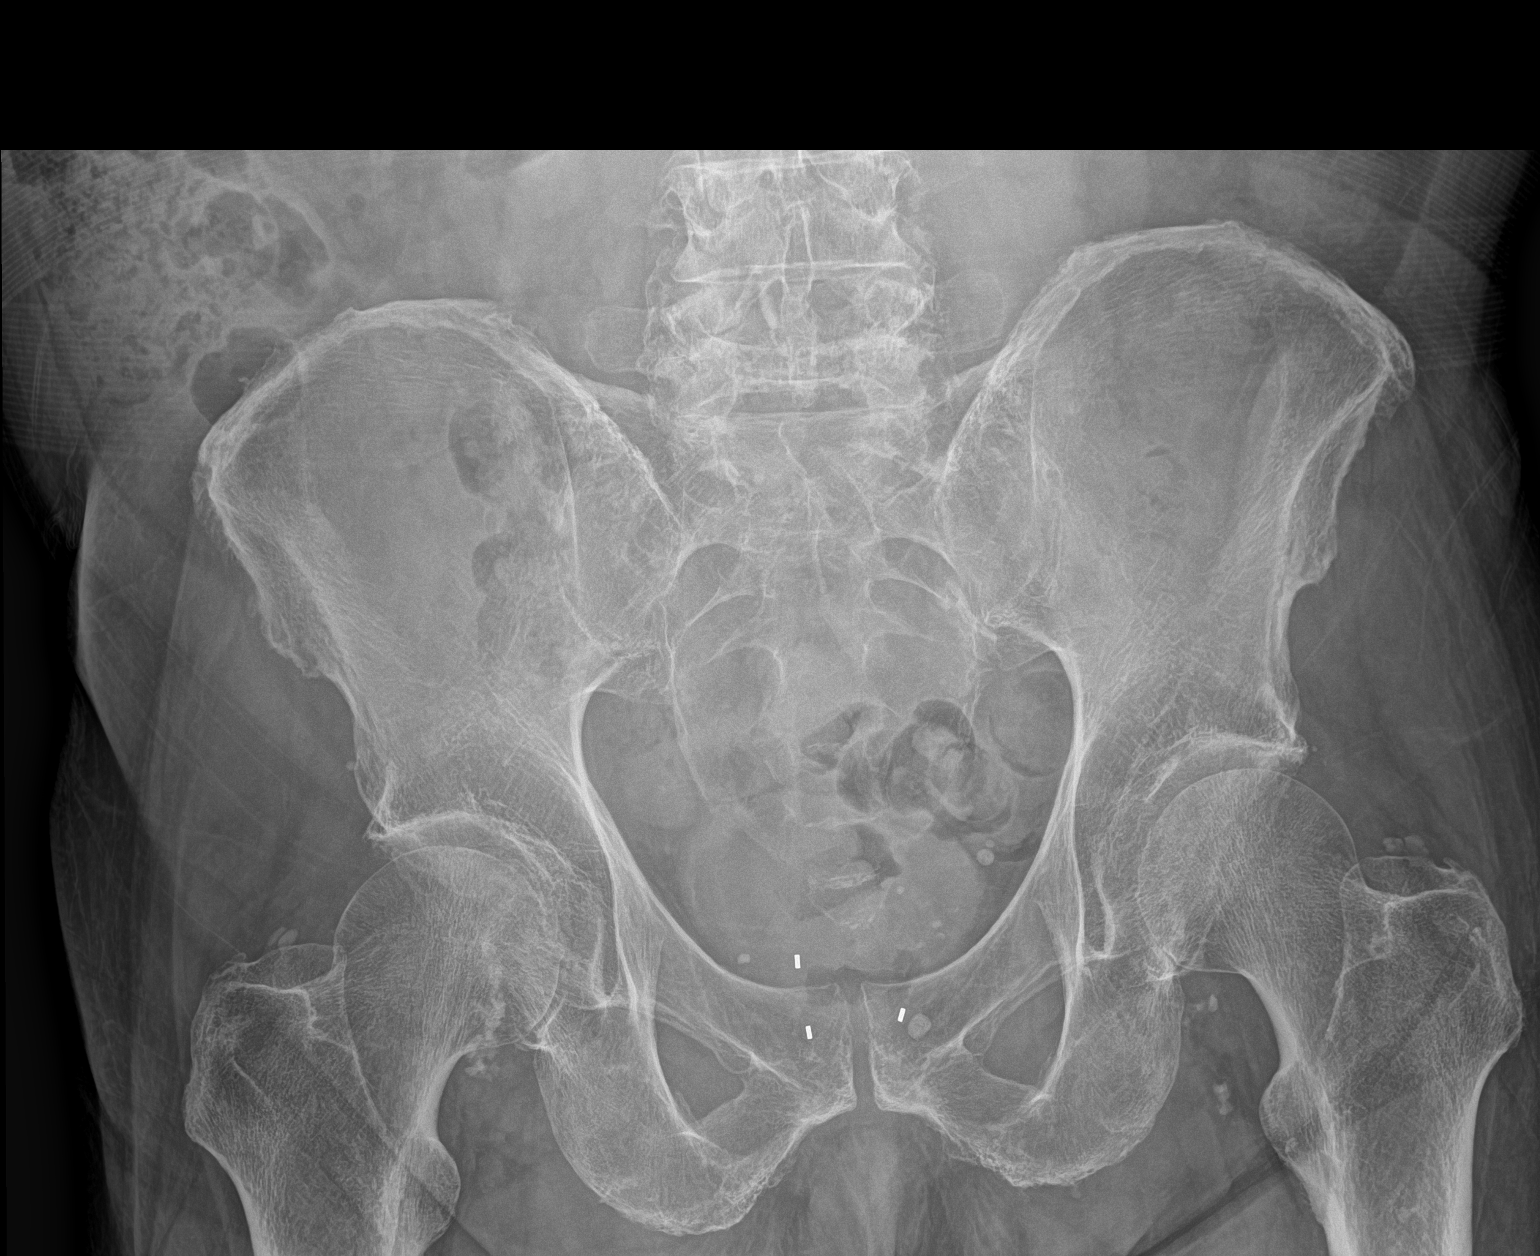

[3 of 3 positions shown; findings below may reference images not displayed]

FINDINGS: Osseous demineralization.

Hip and SI joint spaces preserved.

No acute fracture, dislocation, or bone destruction.

Scattered atherosclerotic calcifications and pelvic phleboliths.
IMPRESSION: No significant osseous abnormalities.

## 2023-07-26 IMAGING — DX DG TIBIA/FIBULA 2V*R*
4 series · 4 of 4 positions shown · non-contrast
Comparison: None

CLINICAL DATA: RIGHT leg pain for 1 week, no known injury

EXAM:
RIGHT TIBIA AND FIBULA - 2 VIEW

[tibia ap (1 of 2)]
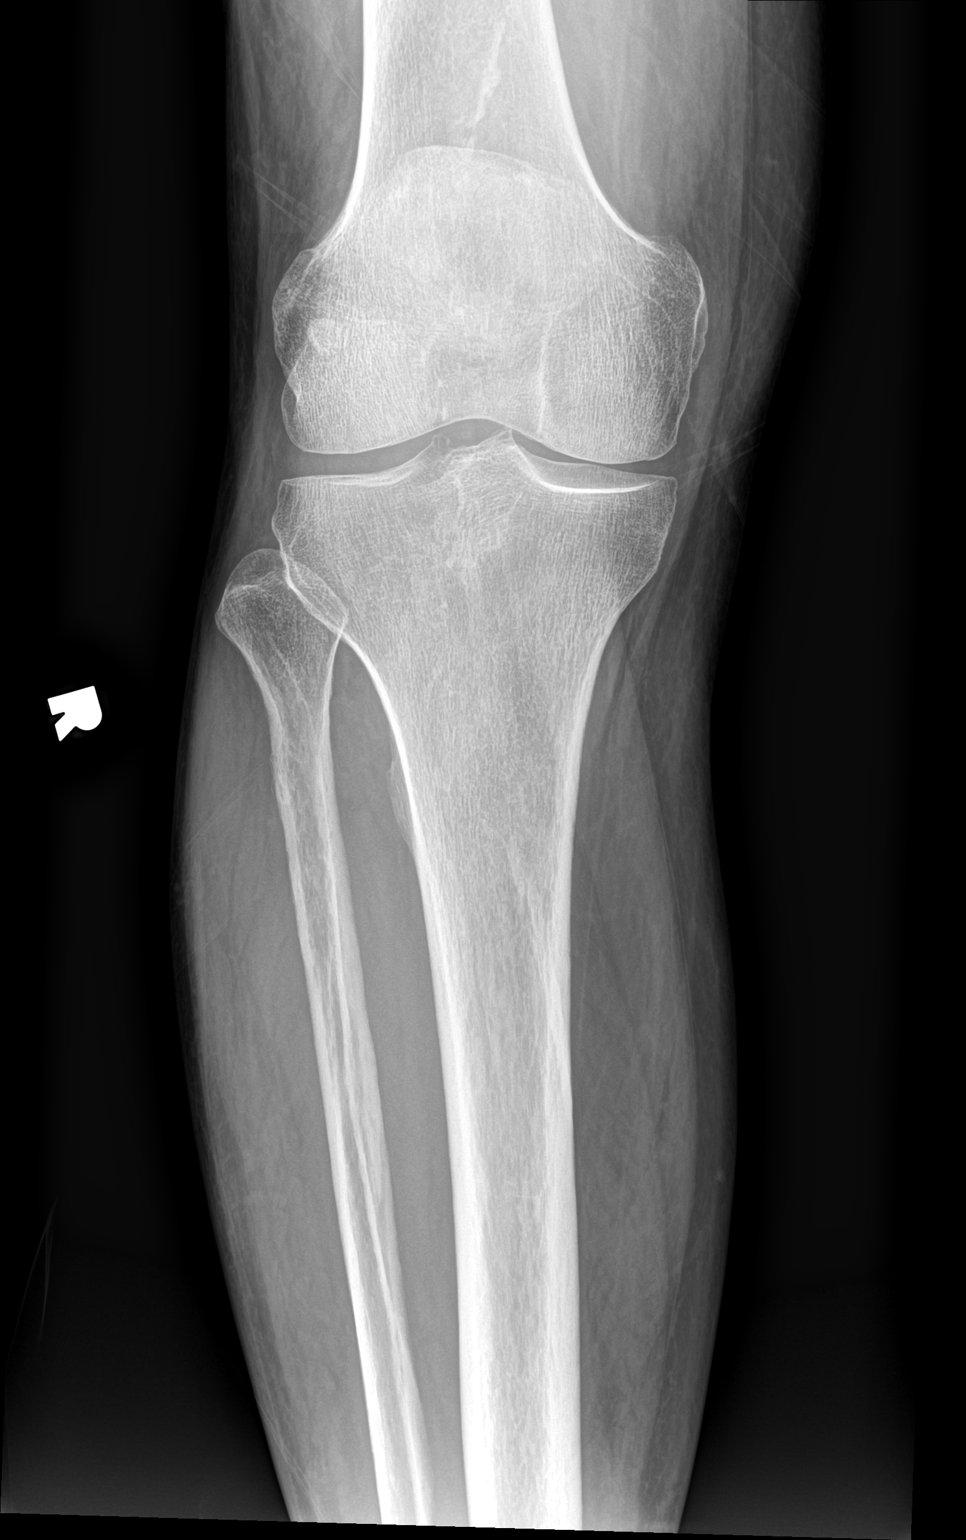

[tibia ap (2 of 2)]
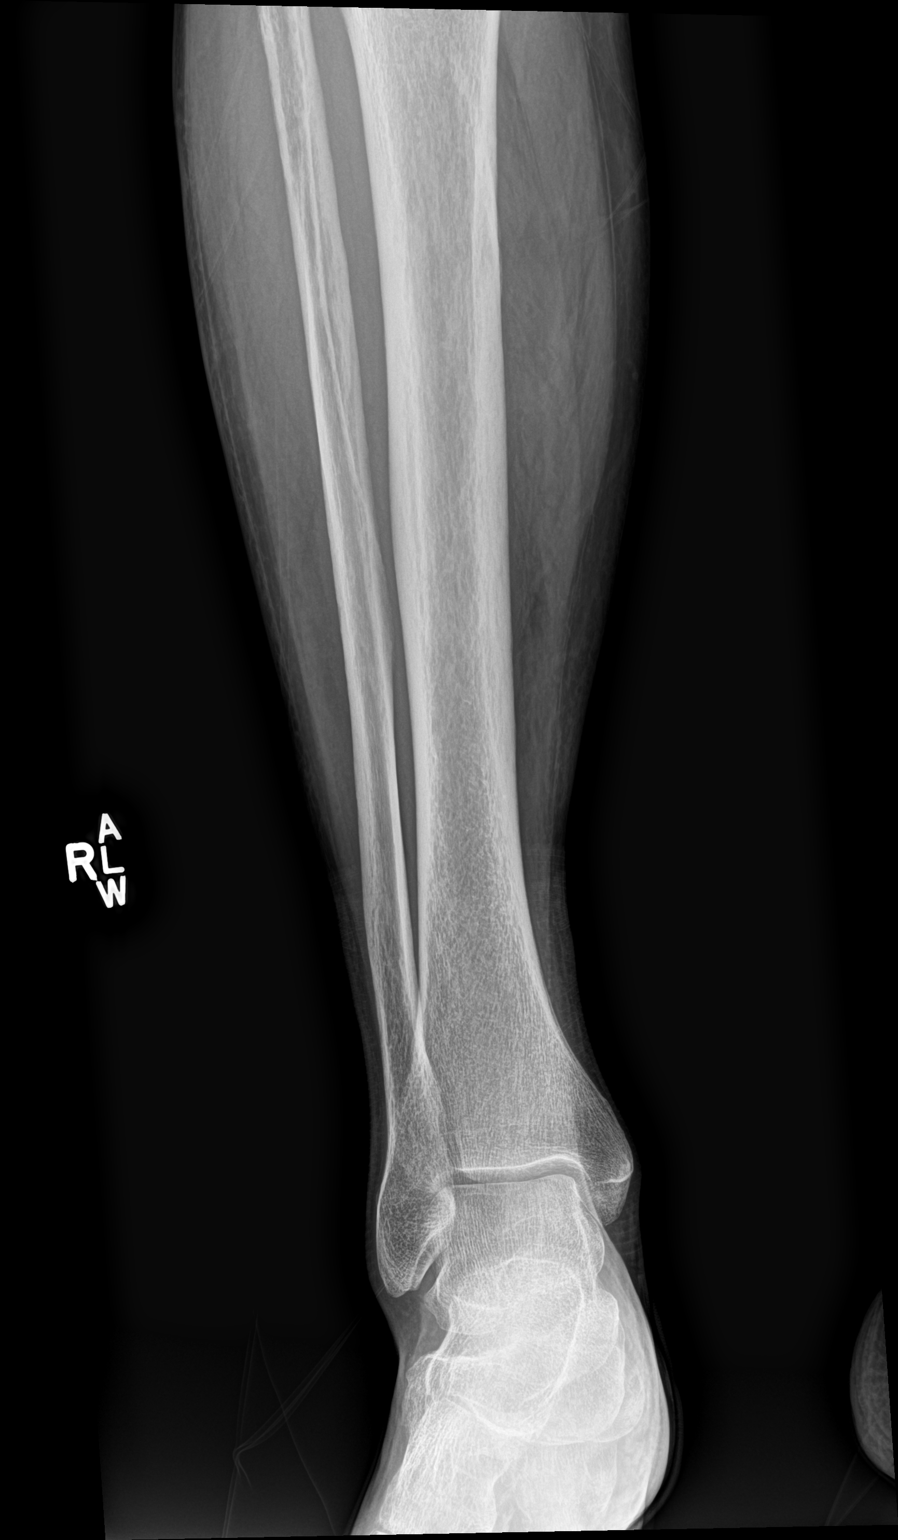

[tibia lat (1 of 2)]
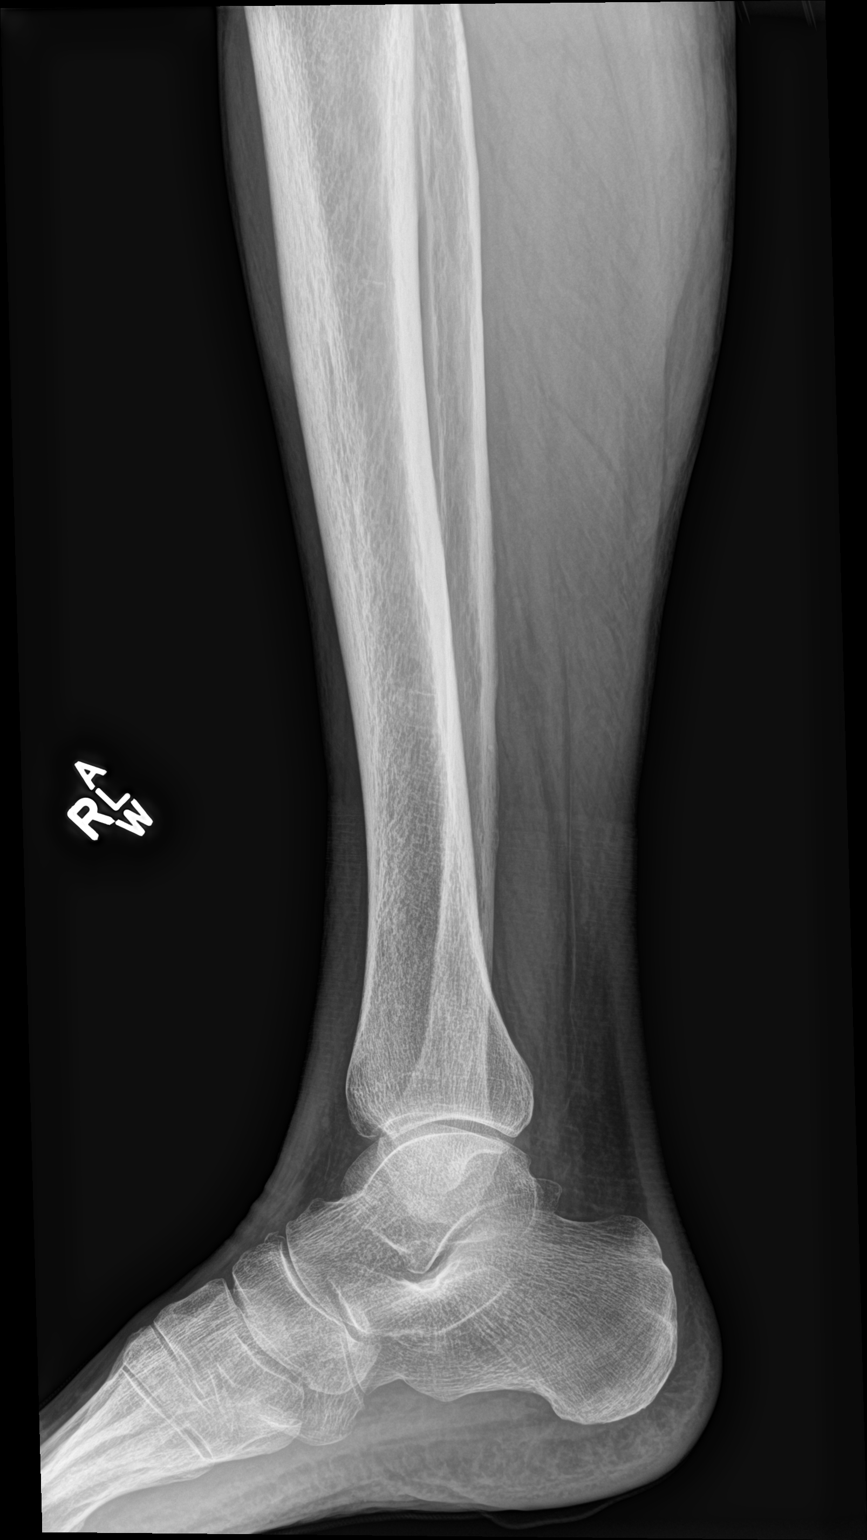

[tibia lat (2 of 2)]
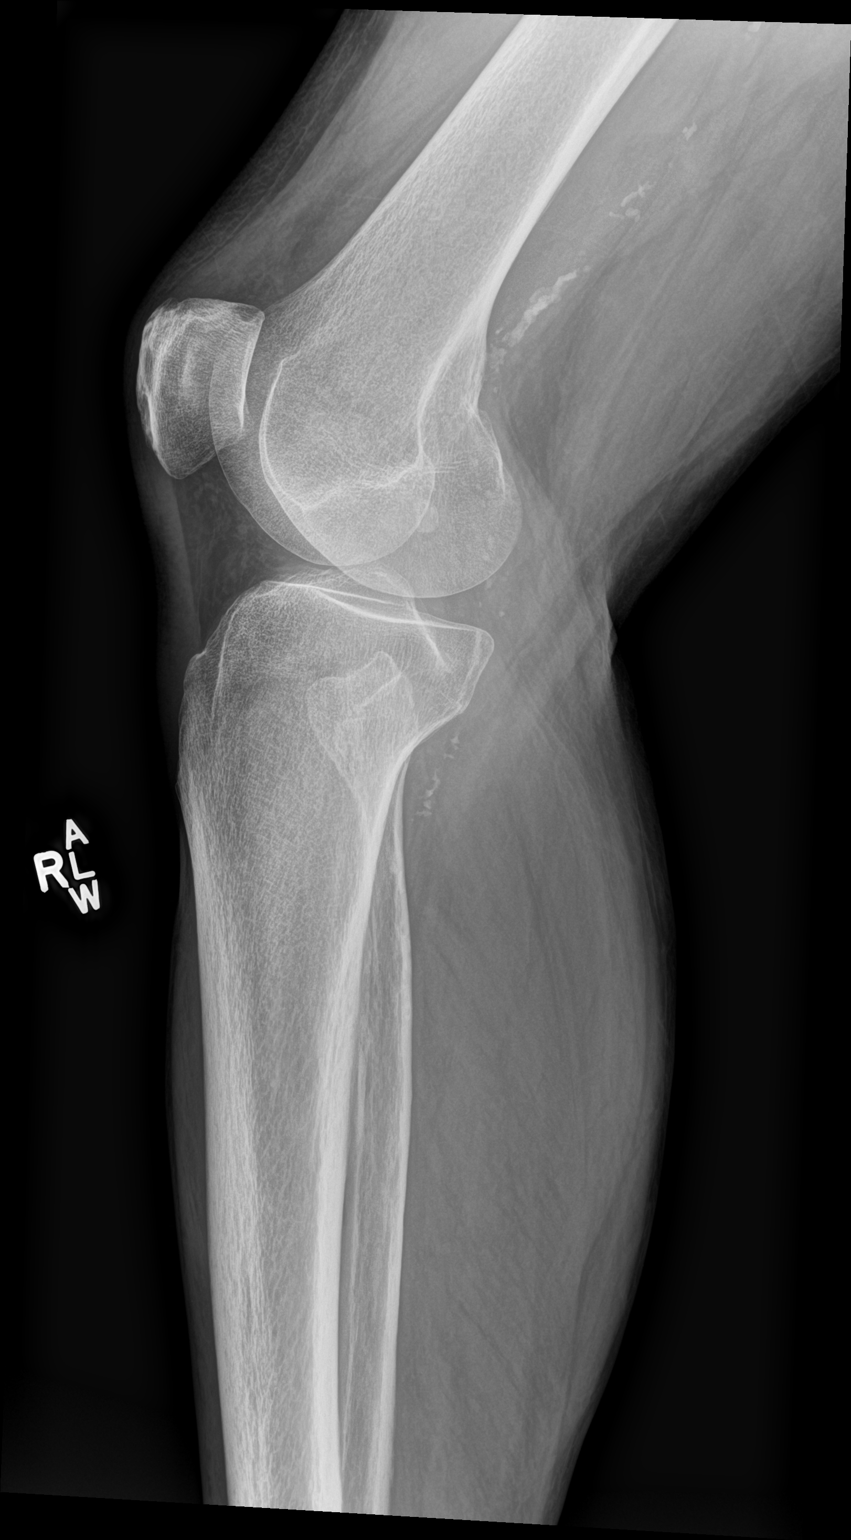

[4 of 4 positions shown; findings below may reference images not displayed]

FINDINGS: Osseous demineralization.

Knee and ankle joint alignment normal.

No acute fracture, dislocation, or bone destruction.

Scattered atherosclerotic calcifications.
IMPRESSION: No acute osseous findings.

## 2023-07-26 IMAGING — DX DG LUMBAR SPINE COMPLETE 4+V
5 series · 5 of 5 positions shown · non-contrast
Comparison: None; correlation CT abdomen and pelvis 06/15/2019

CLINICAL DATA: RIGHT leg pain for 1 week, no known injury

EXAM:
LUMBAR SPINE - COMPLETE 4+ VIEW

[l-spine ap]
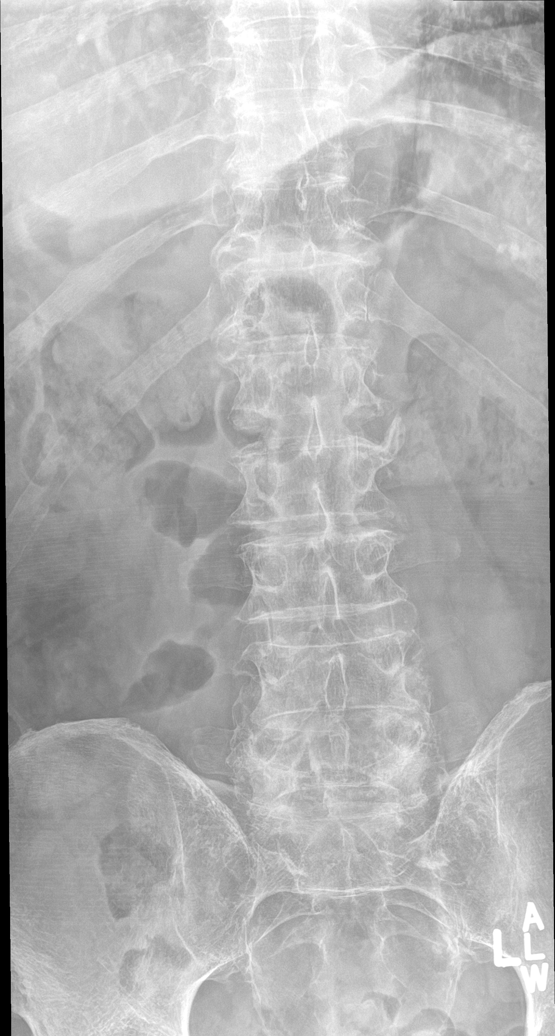

[l-spine obl (1 of 2)]
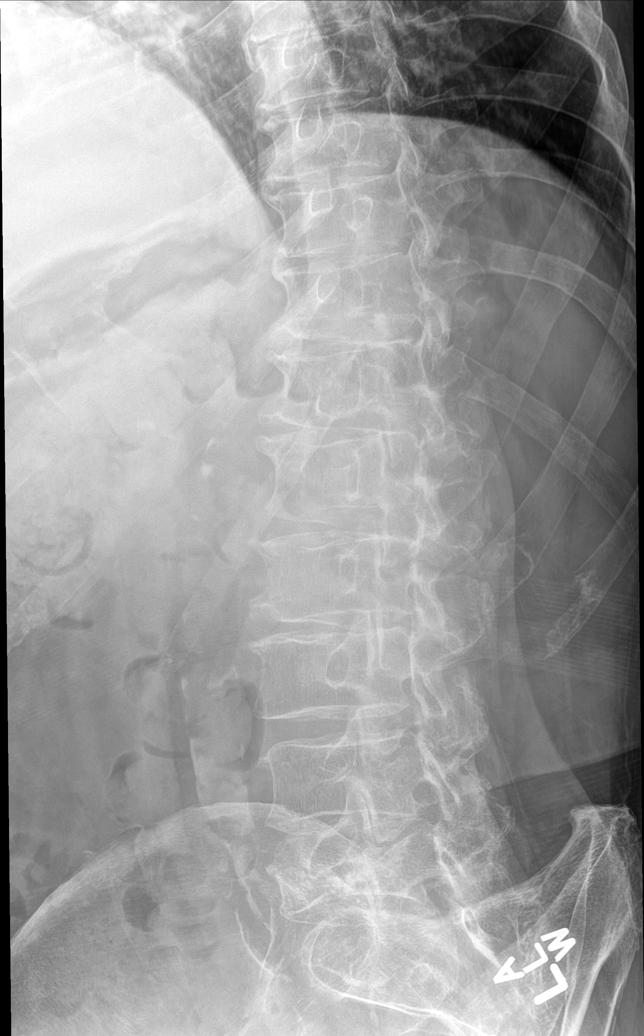

[l-spine spot]
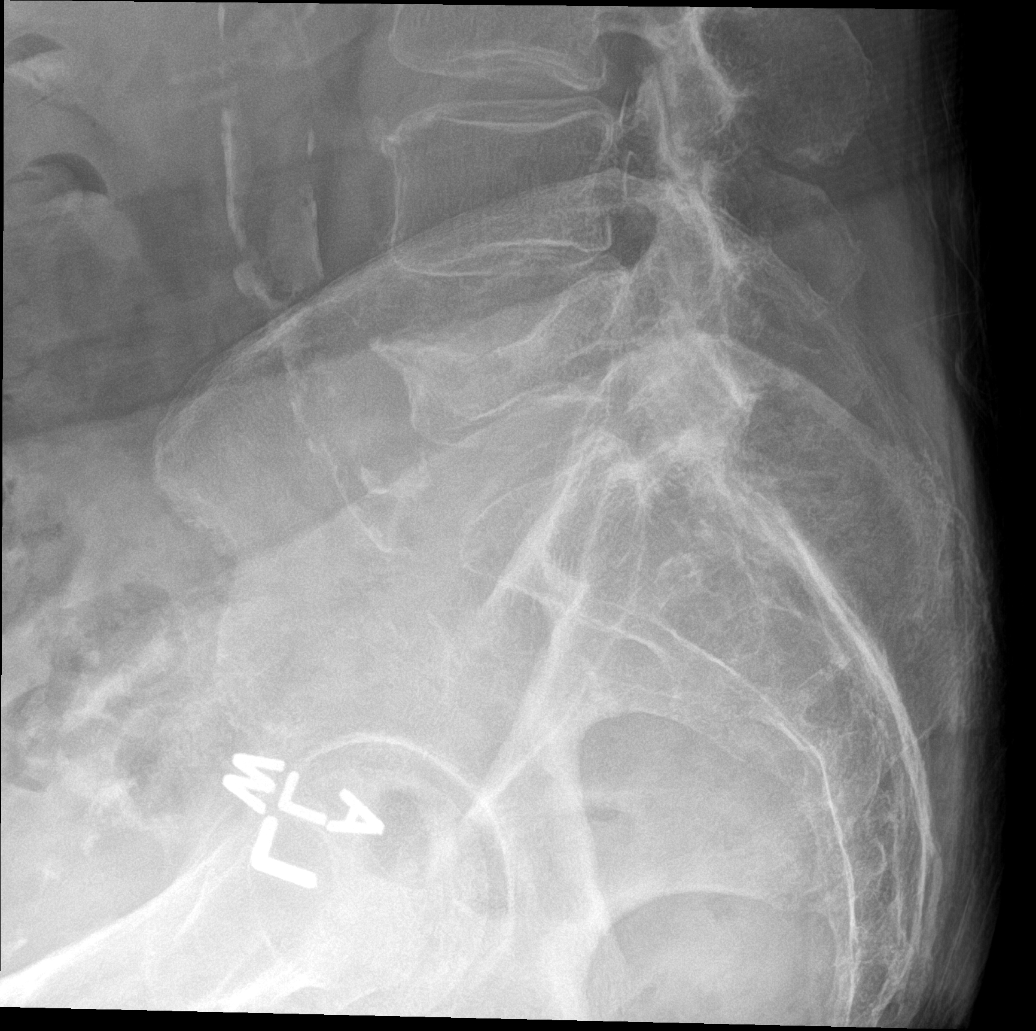

[l-spine obl (2 of 2)]
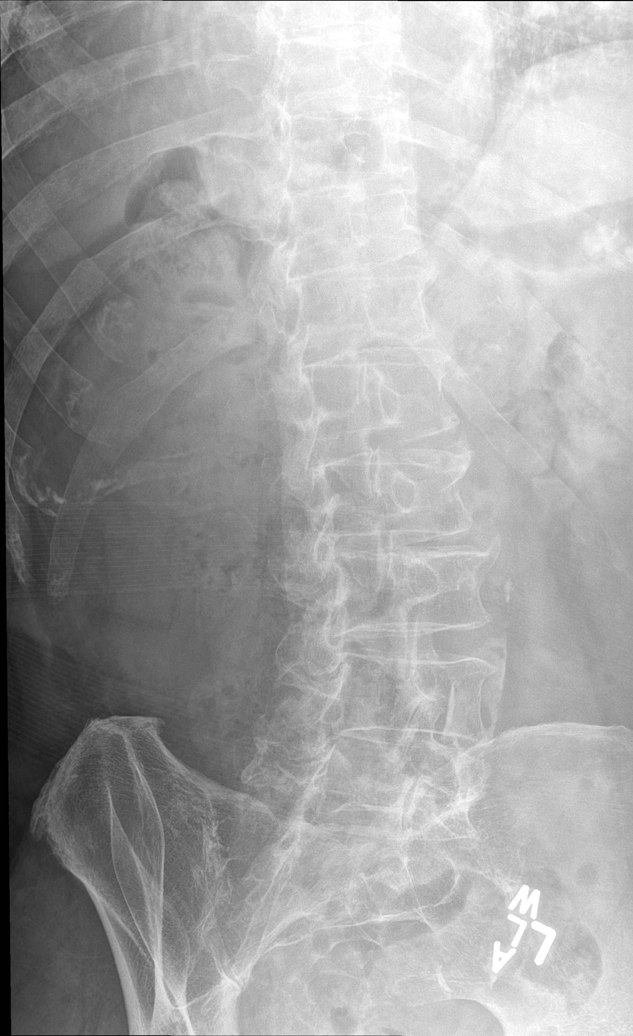

[l-spine lat]
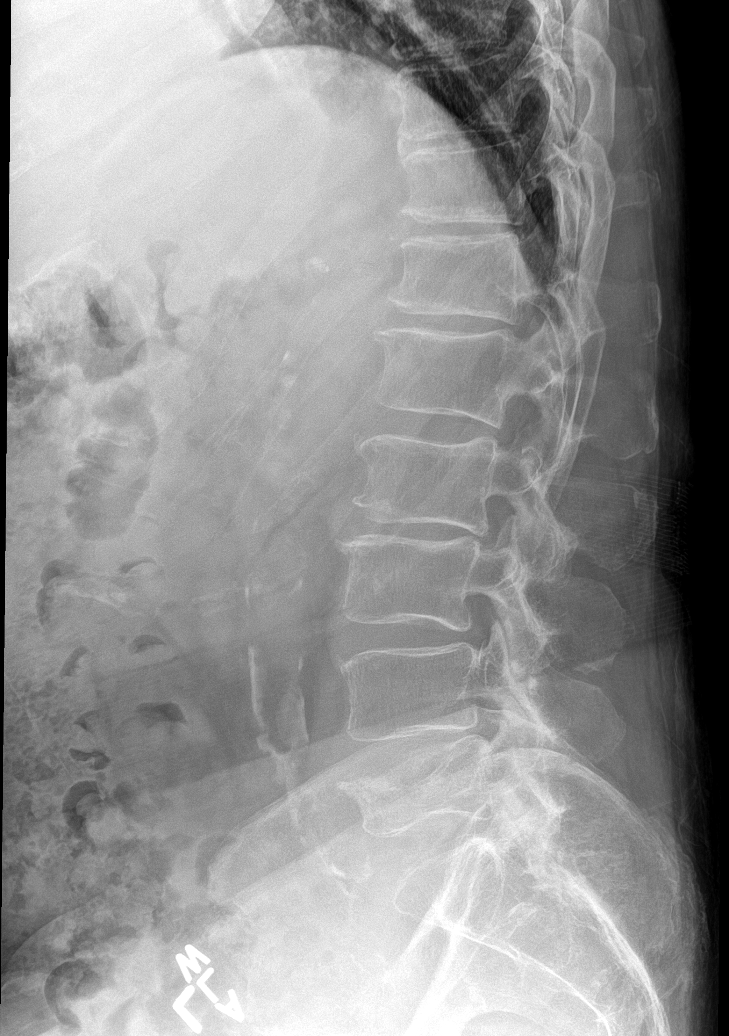

[5 of 5 positions shown; findings below may reference images not displayed]

FINDINGS: Osseous demineralization.

Five non-rib-bearing lumbar vertebra.

Superior endplate compression fracture of L5 with 40-50% anterior
height loss new since 06/15/2019.

Minimal retropulsion of the posterosuperior L5 vertebral body.

Scattered endplate spur formation lumbar spine with disc space
narrowing at L2-L3.

No additional fracture, subluxation, or bone destruction.
IMPRESSION: New superior endplate compression fracture of L5 since 06/15/2019
with 40-50% anterior height loss and minimal retropulsion of the
posterior upper L5 vertebral body.

Osseous demineralization with scattered degenerative disc disease
changes.

## 2023-11-05 ENCOUNTER — Encounter: Payer: Self-pay | Admitting: Cardiology

## 2023-11-05 ENCOUNTER — Ambulatory Visit: Attending: Cardiology | Admitting: Cardiology

## 2023-11-05 VITALS — BP 136/76 | HR 91 | Ht 70.0 in | Wt 183.0 lb

## 2023-11-05 DIAGNOSIS — I2510 Atherosclerotic heart disease of native coronary artery without angina pectoris: Secondary | ICD-10-CM

## 2023-11-05 DIAGNOSIS — K2100 Gastro-esophageal reflux disease with esophagitis, without bleeding: Secondary | ICD-10-CM | POA: Insufficient documentation

## 2023-11-05 DIAGNOSIS — K2930 Chronic superficial gastritis without bleeding: Secondary | ICD-10-CM | POA: Insufficient documentation

## 2023-11-05 DIAGNOSIS — G619 Inflammatory polyneuropathy, unspecified: Secondary | ICD-10-CM | POA: Insufficient documentation

## 2023-11-05 DIAGNOSIS — G622 Polyneuropathy due to other toxic agents: Secondary | ICD-10-CM | POA: Insufficient documentation

## 2023-11-05 DIAGNOSIS — I350 Nonrheumatic aortic (valve) stenosis: Secondary | ICD-10-CM

## 2023-11-05 DIAGNOSIS — E088 Diabetes mellitus due to underlying condition with unspecified complications: Secondary | ICD-10-CM

## 2023-11-05 DIAGNOSIS — D509 Iron deficiency anemia, unspecified: Secondary | ICD-10-CM

## 2023-11-05 DIAGNOSIS — K2970 Gastritis, unspecified, without bleeding: Secondary | ICD-10-CM | POA: Insufficient documentation

## 2023-11-05 DIAGNOSIS — I10 Essential (primary) hypertension: Secondary | ICD-10-CM

## 2023-11-05 DIAGNOSIS — Z8546 Personal history of malignant neoplasm of prostate: Secondary | ICD-10-CM | POA: Insufficient documentation

## 2023-11-05 DIAGNOSIS — K2980 Duodenitis without bleeding: Secondary | ICD-10-CM | POA: Insufficient documentation

## 2023-11-05 HISTORY — DX: Iron deficiency anemia, unspecified: D50.9

## 2023-11-05 NOTE — Progress Notes (Signed)
 Cardiology Office Note:    Date:  11/05/2023   ID:  Benjamin Davila, DOB 1940-07-07, MRN [id number]  PCP:  Alejandro Hurt, FNP  Cardiologist:  Nelia Balzarine, MD   Referring MD: Alejandro Hurt, FNP    ASSESSMENT:    1. Aortic stenosis, moderate   2. Coronary artery disease involving native coronary artery of native heart without angina pectoris   3. Essential hypertension   4. Diabetes mellitus due to underlying condition with unspecified complications (HCC)    PLAN:    In order of problems listed above:  Coronary artery disease: Secondary prevention stressed with patient.  Importance of compliance with diet medication stressed and vocalized understanding.  He was advised to walk and ambulate to the best of his ability. Moderate aortic stenosis: Stable and we will have an echocardiogram to follow-up on this.  Symptoms of aortic stenosis given and education was given about this. Essential hypertension: Blood pressure is stable and diet was emphasized. Mixed dyslipidemia: On lipid-lowering medications followed by primary care.  Goal LDL must be less than 60. Diabetes mellitus: Diet emphasized. Patient will be seen in follow-up appointment in 6 months or earlier if the patient has any concerns.    Medication Adjustments/Labs and Tests Ordered: Current medicines are reviewed at length with the patient today.  Concerns regarding medicines are outlined above.  Orders Placed This Encounter  Procedures   ECHOCARDIOGRAM COMPLETE   No orders of the defined types were placed in this encounter.    Chief Complaint  Patient presents with   Follow-up     History of Present Illness:    Benjamin Davila is a 83 y.o. male.  Patient has past medical history of moderate aortic stenosis, essential hypertension, mixed dyslipidemia and diabetes mellitus.  He takes care of activities of daily living.  He mentions to me that his spinal fusion surgery went fine.  No history of dyspnea on  exertion chest pain syncope or any such symptoms.  He is accompanied by his wife.  At the time of my evaluation, the patient is alert awake oriented and in no distress.  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   History of pancreatitis 07/12/2020   History of pancytopenia 07/12/2020   Hyperlipidemia, unspecified 07/12/2020   Irregular heart beat 07/12/2020   Left inguinal pain 12/26/2022   Long term (current) use of insulin  (HCC) 07/12/2020   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   Lumbar radiculopathy 06/17/2020   Lumbar spondylosis 07/28/2020   Malignant neoplasm of prostate (HCC) 06/24/2019   Mood disorder (HCC) 07/12/2020   Pain of left hip joint 08/25/2019   Pancreatitis    2007   PONV (postoperative nausea and vomiting)    Prostate cancer (HCC)    S/P lumbar laminectomy 08/09/2021   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 OR;  Service: Neurosurgery;  Laterality: Left;   LUMBAR  LAMINECTOMY/DECOMPRESSION MICRODISCECTOMY Bilateral 08/09/2021   Procedure: Lumbar decompressive laminectomy  - Lumbar two-Lumabr three - Lumbar three-Lumbar four - Lumbar four-Lumbar five - Lumabr five-Sacal one - Bilateral, Posterior Lateral Fusion Lumbar three-four-Right;  Surgeon: Isadora Mar, MD;  Location: [facility] OR;  Service: Neurosurgery;  Laterality: Bilateral;   TONSILLECTOMY     as a kid    Current Medications: Current Meds  Medication Sig   acetaminophen  (TYLENOL ) 500 MG tablet Take 500-1,000 mg by mouth every 6 (six) hours as needed for moderate pain (pain score 4-6).   atorvastatin (LIPITOR) 20 MG tablet Take 20 mg by mouth every morning.   diazepam  (VALIUM ) 5 MG tablet Take 1 tablet (5 mg total) by mouth at bedtime as needed for anxiety (moods,and muscle spasm).   ferrous sulfate  325 (65 FE) MG tablet Take 325 mg by mouth daily.   insulin  glargine (LANTUS SOLOSTAR) 100 UNIT/ML Solostar Pen Inject 32 Units into the skin daily before breakfast.   Krill Oil 500 MG CAPS Take 1,000 mg by mouth every morning.   losartan  (COZAAR ) 100 MG tablet Take 100 mg by mouth daily.   metFORMIN  (GLUCOPHAGE ) 500 MG tablet Take 1,000 mg by mouth 2 (two) times a day.   Multiple Vitamin (MULTIVITAMIN WITH MINERALS) TABS tablet Take 2 tablets by mouth every morning.   tamsulosin  (FLOMAX ) 0.4 MG CAPS capsule Take 0.4 mg by mouth daily as needed (infrequent urination).   Testosterone 1.62 % GEL Apply 1 Pump topically every morning.     Allergies:   Lisinopril   Social History   Socioeconomic History   Marital status: Married    Spouse name: Not on file   Number of children: Not on file   Years of education: Not on file   Highest education level: Not on file  Occupational History   Not on file  Tobacco Use   Smoking status: Never   Smokeless tobacco: Never  Vaping Use   Vaping status: Never Used  Substance and Sexual Activity   Alcohol use: Not Currently   Drug use: Never   Sexual  activity: Not on file  Other Topics Concern   Not on file  Social History Narrative   Not on file   Social Drivers of Health   Financial Resource Strain: Not on file  Food Insecurity: Not on file  Transportation Needs: Not on file  Physical Activity: Not on file  Stress: Not on file  Social Connections: Not on file     Family History: The patient's family history includes Lupus in his mother.  ROS:   Please see the history of present illness.    All other systems reviewed and are negative.  EKGs/Labs/Other Studies Reviewed:    The following studies were reviewed today: I discussed my findings with the patient at length   Recent Labs: 12/20/2022: Hemoglobin 12.0; Platelets 178 02/07/2023: ALT 25; BUN 16; Creatinine, Ser 1.05; Potassium 4.4; Sodium 140  Recent Lipid Panel    Component Value Date/Time   CHOL 116 02/07/2023 1056   TRIG 123 02/07/2023 1056   HDL 43 02/07/2023 1056   CHOLHDL 2.7 02/07/2023 1056   LDLCALC 51 02/07/2023  1056    Physical Exam:    VS:  BP 136/76   Pulse 91   Ht 5' 10 (1.778 m)   Wt 183 lb (83 kg)   SpO2 95%   BMI 26.26 kg/m     Wt Readings from Last 3 Encounters:  11/05/23 183 lb (83 kg)  03/27/23 172 lb (78 kg)  03/18/23 177 lb 6.4 oz (80.5 kg)     GEN: Patient is in no acute distress HEENT: Normal NECK: No JVD; No carotid bruits LYMPHATICS: No lymphadenopathy CARDIAC: Hear sounds regular, 2/6 systolic murmur at the apex and 3 or 6 systolic murmur at the aortic area. RESPIRATORY:  Clear to auscultation without rales, wheezing or rhonchi  ABDOMEN: Soft, non-tender, non-distended MUSCULOSKELETAL:  No edema; No deformity  SKIN: Warm and dry NEUROLOGIC:  Alert and oriented x 3 PSYCHIATRIC:  Normal affect   Signed, Nelia Balzarine, MD  11/05/2023 10:50 AM    [facility]

## 2023-11-05 NOTE — Patient Instructions (Signed)
 Medication Instructions:  No changes *If you need a refill on your cardiac medications before your next appointment, please call your pharmacy*  Lab Work: No labs If you have labs (blood work) drawn today and your tests are completely normal, you will receive your results only by: MyChart Message (if you have MyChart) OR A paper copy in the mail If you have any lab test that is abnormal or we need to change your treatment, we will call you to review the results.  Testing/Procedures: Your physician has requested that you have an echocardiogram. Echocardiography is a painless test that uses sound waves to create images of your heart. It provides your doctor with information about the size and shape of your heart and how well your heart's chambers and valves are working. This procedure takes approximately one hour. There are no restrictions for this procedure. Please do NOT wear cologne, perfume, aftershave, or lotions (deodorant is allowed). Please arrive 15 minutes prior to your appointment time.  Please note: We ask at that you not bring children with you during ultrasound (echo/ vascular) testing. Due to room size and safety concerns, children are not allowed in the ultrasound rooms during exams. Our front office staff cannot provide observation of children in our lobby area while testing is being conducted. An adult accompanying a patient to their appointment will only be allowed in the ultrasound room at the discretion of the ultrasound technician under special circumstances. We apologize for any inconvenience.  Follow-Up: At [facility], you and your health needs are our priority.  As part of our continuing mission to provide you with exceptional heart care, our providers are all part of one team.  This team includes your primary Cardiologist (physician) and Advanced Practice Providers or APPs (Physician Assistants and Nurse Practitioners) who all work together to provide you with  the care you need, when you need it.  Your next appointment:   1 year(s)  Provider:   Hillis Lu MD   We recommend signing up for the patient portal called MyChart.  Sign up information is provided on this After Visit Summary.  MyChart is used to connect with patients for Virtual Visits (Telemedicine).  Patients are able to view lab/test results, encounter notes, upcoming appointments, etc.  Non-urgent messages can be sent to your provider as well.   To learn more about what you can do with MyChart, go to [email or web address].

## 2023-11-07 ENCOUNTER — Ambulatory Visit (HOSPITAL_BASED_OUTPATIENT_CLINIC_OR_DEPARTMENT_OTHER)

## 2023-12-19 IMAGING — CR DG LUMBAR SPINE 1V
1 series · 1 of 1 positions shown · non-contrast
Comparison: Lumbar radiographs 03/16/2021, 07/04/2021

CLINICAL DATA: Lumbar laminectomy L2-3 L3-4 L4-5 L5-S1

EXAM:
LUMBAR SPINE - 1 VIEW

[lateral]
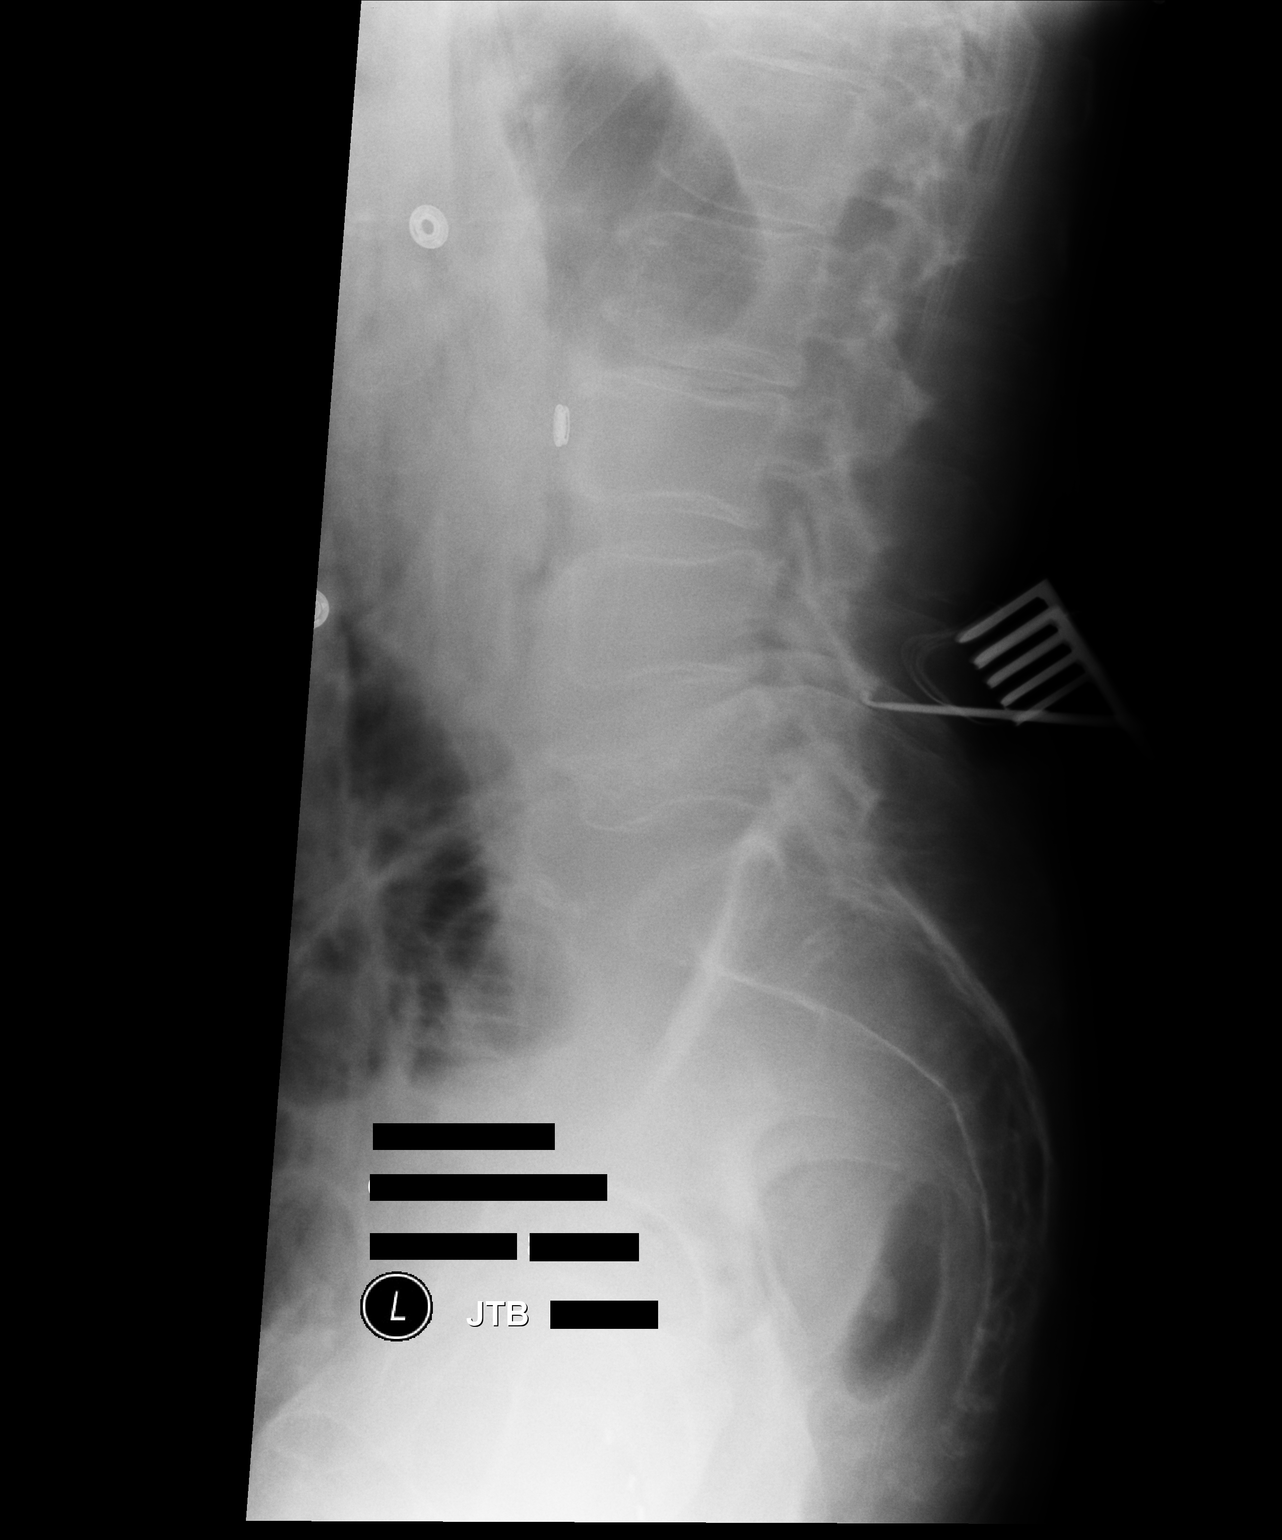

[1 of 1 positions shown; findings below may reference images not displayed]

FINDINGS: Five lumbar segments on prior AP lumbar spine. Moderate compression
fracture of L5 is chronic

Lateral image in the operating room was obtained for surgical
localization. Surgical instrument posterior to the spinal canal at
the L4-5 level.
IMPRESSION: L4-5 level localized in the operating room.

## 2024-02-18 ENCOUNTER — Ambulatory Visit (HOSPITAL_BASED_OUTPATIENT_CLINIC_OR_DEPARTMENT_OTHER)
Admission: RE | Admit: 2024-02-18 | Discharge: 2024-02-18 | Disposition: A | Discharge status: Home / Self Care | Source: Ambulatory Visit | Attending: Cardiology | Admitting: Cardiology

## 2024-02-18 DIAGNOSIS — I350 Nonrheumatic aortic (valve) stenosis: Secondary | ICD-10-CM | POA: Insufficient documentation

## 2024-02-18 LAB — ECHOCARDIOGRAM COMPLETE
AR max vel: 0.94 cm2
AV Area VTI: 0.94 cm2
AV Area mean vel: 0.93 cm2
AV Mean grad: 50.3 mmHg
AV Peak grad: 98.8 mmHg
AV Vena cont: 0.3 cm
Ao pk vel: 4.97 m/s
Area-P 1/2: 2.46 cm2
Calc EF: 81.3 %
MV M vel: 5.57 m/s
MV Peak grad: 124.1 mmHg
S' Lateral: 1.8 cm
Single Plane A2C EF: 78.3 %
Single Plane A4C EF: 81.6 %

## 2024-02-20 ENCOUNTER — Ambulatory Visit: Payer: Self-pay | Admitting: Cardiology

## 2024-04-07 DIAGNOSIS — G629 Polyneuropathy, unspecified: Secondary | ICD-10-CM | POA: Insufficient documentation

## 2024-04-07 DIAGNOSIS — G5700 Lesion of sciatic nerve, unspecified lower limb: Secondary | ICD-10-CM | POA: Insufficient documentation

## 2024-04-09 ENCOUNTER — Encounter: Payer: Self-pay | Admitting: Student in an Organized Health Care Education/Training Program

## 2024-04-09 ENCOUNTER — Ambulatory Visit (INDEPENDENT_AMBULATORY_CARE_PROVIDER_SITE_OTHER): Admitting: Student in an Organized Health Care Education/Training Program

## 2024-04-09 VITALS — BP 163/87 | HR 78 | Wt 180.0 lb

## 2024-04-09 DIAGNOSIS — Z794 Long term (current) use of insulin: Secondary | ICD-10-CM

## 2024-04-09 DIAGNOSIS — I10 Essential (primary) hypertension: Secondary | ICD-10-CM

## 2024-04-09 DIAGNOSIS — M48061 Spinal stenosis, lumbar region without neurogenic claudication: Secondary | ICD-10-CM | POA: Diagnosis not present

## 2024-04-09 DIAGNOSIS — F32A Depression, unspecified: Secondary | ICD-10-CM | POA: Insufficient documentation

## 2024-04-09 DIAGNOSIS — E781 Pure hyperglyceridemia: Secondary | ICD-10-CM

## 2024-04-09 DIAGNOSIS — F39 Unspecified mood [affective] disorder: Secondary | ICD-10-CM

## 2024-04-09 DIAGNOSIS — F419 Anxiety disorder, unspecified: Secondary | ICD-10-CM | POA: Insufficient documentation

## 2024-04-09 DIAGNOSIS — E1140 Type 2 diabetes mellitus with diabetic neuropathy, unspecified: Secondary | ICD-10-CM | POA: Diagnosis not present

## 2024-04-09 DIAGNOSIS — G629 Polyneuropathy, unspecified: Secondary | ICD-10-CM | POA: Diagnosis not present

## 2024-04-09 DIAGNOSIS — M1990 Unspecified osteoarthritis, unspecified site: Secondary | ICD-10-CM | POA: Insufficient documentation

## 2024-04-09 DIAGNOSIS — E785 Hyperlipidemia, unspecified: Secondary | ICD-10-CM

## 2024-04-09 DIAGNOSIS — E1169 Type 2 diabetes mellitus with other specified complication: Secondary | ICD-10-CM | POA: Diagnosis not present

## 2024-04-09 DIAGNOSIS — I152 Hypertension secondary to endocrine disorders: Secondary | ICD-10-CM

## 2024-04-09 DIAGNOSIS — H269 Unspecified cataract: Secondary | ICD-10-CM | POA: Insufficient documentation

## 2024-04-09 DIAGNOSIS — K859 Acute pancreatitis without necrosis or infection, unspecified: Secondary | ICD-10-CM | POA: Insufficient documentation

## 2024-04-09 DIAGNOSIS — E1159 Type 2 diabetes mellitus with other circulatory complications: Secondary | ICD-10-CM

## 2024-04-09 LAB — POCT GLYCOSYLATED HEMOGLOBIN (HGB A1C): Hemoglobin A1C: 5.9 % — AB (ref 4.0–5.6)

## 2024-04-09 MED ORDER — ATORVASTATIN CALCIUM 20 MG PO TABS: 20.0000 mg | ORAL_TABLET | Freq: Every morning | ORAL | 3 refills | Status: AC

## 2024-04-09 MED ORDER — DIAZEPAM 5 MG PO TABS
5.0000 mg | ORAL_TABLET | Freq: Every evening | ORAL | 0 refills | Status: AC | PRN
Start: 2024-04-09 — End: ?

## 2024-04-09 MED ORDER — LANTUS SOLOSTAR 100 UNIT/ML ~~LOC~~ SOPN: 15.0000 [IU] | PEN_INJECTOR | Freq: Every day | SUBCUTANEOUS | Status: AC

## 2024-04-09 MED ORDER — LOSARTAN POTASSIUM 100 MG PO TABS: 100.0000 mg | ORAL_TABLET | Freq: Every day | ORAL | 3 refills | Status: AC

## 2024-04-09 NOTE — Patient Instructions (Signed)
  VISIT SUMMARY: During today's visit, we discussed your ongoing pain and discomfort following lumbar fusion surgery, as well as your diabetes management, aortic stenosis, hypertension, and general health maintenance. We reviewed your current medications and made some adjustments to better manage your conditions.  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 Your A1c is currently at 5.9%, which is good control, but we aim to keep it between 6.5-7% to reduce complications. We have decreased your Lantus  insulin  to 15 units daily and will continue your metformin  at the current dose. We will recheck your A1c in 3 months. You have been referred to a podiatrist for foot care and neuropathy management.  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 Pain management has been challenging, but Valium  has been effective for mood-related discomfort. We will continue Valium  as needed for this purpose and focus on maintaining your function and quality of life.  -AORTIC STENOSIS, MONITORED: Aortic stenosis is a condition where the heart's aortic valve narrows, which can affect blood flow. This condition is being monitored by your cardiologist, and there are no current symptoms. Continue with regular monitoring.  -HYPERTENSION: Hypertension, or high blood pressure, is being managed with losartan . Your blood pressure readings are higher in clinical settings but normal at home. We have refilled your losartan  prescription with a 91-month supply and 3 refills.  -HYPERLIPIDEMIA: Hyperlipidemia is a condition where there are high levels of fats in your blood. This is being managed with atorvastatin , and we have refilled your prescription.  -GENERAL HEALTH MAINTENANCE: Routine health  maintenance was discussed. We have scheduled a follow-up appointment in 3 months for blood work and an A1c recheck.  INSTRUCTIONS: Please follow up in 3 months for blood work and an A1c recheck. Additionally, make sure to see the podiatrist for foot care and neuropathy management as referred.

## 2024-04-09 NOTE — Assessment & Plan Note (Signed)
 Chronic and stable.  Seems fairly anxious and perhaps some issues with mild cognitive impairment coming into play.  Still functional.  Has not used an SSRI in the past.  Rarely uses diazepam  as needed for acute anxiety for many years.  There are some risk here that he is accepting given his advanced age, but he is adamant that this has been a very helpful intervention for him, improving his quality of life, and that he has no side effects.

## 2024-04-09 NOTE — Assessment & Plan Note (Signed)
 Chronic lumbar pain and neuropathy persist post-surgery. Pain is not well-controlled with oxycodone  and gabapentin , but Valium  is effective for mood-related discomfort. Pain management is challenging due to lack of response to typical analgesics. Continue Valium  as needed for mood-related discomfort. Focus on maintaining function and quality of life.

## 2024-04-09 NOTE — Assessment & Plan Note (Signed)
 He has long-standing diabetes with neuropathy and foot complications. His A1c is at 5.9%, indicating good control. Discussed the risks of low A1c and the benefits of maintaining it between 6.5-7% to reduce complications and medication side effects. Considering transitioning off Lantus due to low A1c, decreased Lantus to 15 units daily. Continue metformin  at the current dose. Recheck A1c in 3 months. Referred to a podiatrist for foot care and neuropathy management.  If A1c increases as we come off the Lantus, would favor adding on a GLP1 agonist or SGLT2 inhibitor in the future.

## 2024-04-09 NOTE — Progress Notes (Signed)
 New Patient Office Visit  Patient ID: Benjamin Davila, Male   DOB: 07/23/1940 83 y.o. MRN: [id number]  Chief Complaint  Patient presents with   Establish Care    General check up   Patient states  Shingles vaccine was 07/04/2016 and 12/01/2016  Eye exam- 1 month ago at [facility] ophthalmology ( requesting eye exam)    Subjective:     Benjamin Davila presents to establish care  HPI  Discussed the use of AI scribe software for clinical note transcription with the patient, who gave verbal consent to proceed.  History of Present Illness Benjamin Davila is an 83 year old male who presents with ongoing pain and discomfort following lumbar fusion surgery. He is accompanied by his wife, [name].  He underwent lumbar fusion surgery approximately one year ago and continues to experience pain and discomfort. The pain is located above the belt line, sometimes radiating to the hip, and causes a limp. Various pain medications, including oxycodone  and gabapentin , have been ineffective. Despite participating in both water and land therapy post-surgery, he still experiences significant pain and limited mobility. He reports a fall after his lumbar surgery but has not had any recent falls. No lightheadedness upon standing.  He has a 30-year history of diabetes, currently managed with Lantus insulin  and metformin . His last A1c was 6.7% in February 2025. He does not regularly check his blood sugar at home due to a broken lancet device. No history of diabetic ketoacidosis or hospital admissions for diabetes-related emergencies. He experiences neuropathy, primarily in his feet, described as numbness and occasional pain. He reports having fungus on his toenails for 25 years and does not currently see a podiatrist.  He has been diagnosed with aortic stenosis, which is being monitored by a cardiologist. No chest pain, but he feels tired and sometimes short of breath, particularly when walking. He also mentions  feeling cold in his throat when walking in chilly weather. He has a history of hypertension, managed with losartan , and reports that his blood pressure readings at home are generally normal, though he checks it infrequently.  He takes an iron supplement due to a history of low hemoglobin levels identified in 2022. A colonoscopy revealed benign polyps and gastritis, but no definitive cause for the low iron was identified.  He describes his mood as 'bad' and reports feeling irritable at times. He finds relief with occasional use of Valium , which he states is the only medication that effectively improves his mood without causing addiction.  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 He does not smoke or drink alcohol. He moved from [place]  to be closer to family and enjoys walking in the park with a walker due to limited endurance.   Outpatient Encounter Medications as of 04/09/2024  Medication Sig   metFORMIN  (GLUCOPHAGE ) 500 MG tablet Take 1,000 mg by mouth 2 (two) times a day.   tamsulosin  (FLOMAX ) 0.4 MG CAPS capsule Take 0.4 mg by mouth daily as needed (infrequent urination).   [DISCONTINUED] atorvastatin (LIPITOR) 20 MG tablet Take 20 mg by mouth every morning.   [DISCONTINUED] diazepam  (VALIUM ) 5 MG tablet Take 1 tablet (5 mg total) by mouth at bedtime as needed for anxiety (moods,and muscle spasm).   [DISCONTINUED] insulin  glargine (LANTUS SOLOSTAR) 100 UNIT/ML Solostar Pen Inject 32 Units into the skin daily before breakfast.   [DISCONTINUED] Krill Oil 500 MG CAPS Take 1,000 mg by mouth every morning.   [DISCONTINUED] losartan  (COZAAR )  100 MG tablet Take 100 mg by mouth daily.   [DISCONTINUED] Multiple Vitamin (MULTIVITAMIN WITH MINERALS) TABS tablet Take 2 tablets by mouth every morning.   atorvastatin  (LIPITOR) 20 MG tablet Take 1 tablet (20 mg total) by mouth every morning.   diazepam  (VALIUM ) 5 MG tablet Take 1 tablet (5  mg total) by mouth at bedtime as needed for anxiety (moods,and muscle spasm).   ferrous sulfate  325 (65 FE) MG tablet Take 325 mg by mouth daily.   insulin  glargine (LANTUS  SOLOSTAR) 100 UNIT/ML Solostar Pen Inject 15 Units into the skin daily before breakfast.   losartan  (COZAAR ) 100 MG tablet Take 1 tablet (100 mg total) by mouth daily.   [DISCONTINUED] acetaminophen  (TYLENOL ) 500 MG tablet Take 500-1,000 mg by mouth every 6 (six) hours as needed for moderate pain (pain score 4-6).   [DISCONTINUED] Testosterone 1.62 % GEL Apply 1 Pump topically every morning. (Patient not taking: Reported on 04/09/2024)   No facility-administered encounter medications on file as of 04/09/2024.    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   Benign prostatic hyperplasia without lower urinary tract symptoms 07/12/2020   Cardiac murmur 12/05/2018   Cataract 2022   Complication of surgical procedure 12/26/2022   Depression 1980   Diabetes mellitus due to underlying condition with unspecified complications (HCC) 12/05/2018   Essential hypertension 12/05/2018   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   History of pancreatitis 07/12/2020   History of pancytopenia 07/12/2020   Hyperlipidemia, unspecified 07/12/2020   Irregular heart beat 07/12/2020   Left inguinal pain 12/26/2022   Long term (current) use of insulin  (HCC) 07/12/2020   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   Lumbar radiculopathy 06/17/2020   Lumbar spondylosis 07/28/2020   Malignant neoplasm of prostate (HCC) 06/24/2019   Mood disorder 07/12/2020   Pain of left hip joint 08/25/2019   Pancreatitis    2007   PONV (postoperative nausea and vomiting)    Prostate cancer (HCC)    S/P lumbar laminectomy 08/09/2021   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   Systolic murmur 07/12/2020   Type 2 diabetes mellitus  with other specified complication (HCC) 07/12/2020    Past Surgical History:  Procedure Laterality Date   BACK SURGERY     lumbar surgery 20 years ago per patient   CATARACT EXTRACTION     CHOLECYSTECTOMY     20 years ago per patient   COLONOSCOPY     LAMINECTOMY WITH POSTERIOR LATERAL ARTHRODESIS LEVEL 2 Left 03/27/2023   Procedure: Left Lumbar Two-Three, Lumbar Three-Four Hemifacetectomy, posterolateral fusion Lumbar Two-Four, segmental fixation Lumbar Two-Four;  Surgeon: Joshua Alm Hamilton, MD;  Location: [facility] OR;  Service: Neurosurgery;  Laterality: Left;   LUMBAR LAMINECTOMY/DECOMPRESSION MICRODISCECTOMY Bilateral 08/09/2021   Procedure: Lumbar decompressive laminectomy  - Lumbar two-Lumabr three - Lumbar three-Lumbar four - Lumbar four-Lumbar five - Lumabr five-Sacal one - Bilateral, Posterior Lateral Fusion Lumbar three-four-Right;  Surgeon: Joshua Alm RAMAN, MD;  Location: [facility];  Service: Neurosurgery;  Laterality: Bilateral;   SPINE SURGERY     TONSILLECTOMY     as a kid    Family History  Problem Relation Age of Onset   Lupus Mother         Objective:    BP (!) 163/87   Pulse 78   Wt 180 lb (81.6 kg)   SpO2 100%   BMI 25.83  kg/m   Physical Exam  Gen: Well-appearing man, moderate frailty Ears: Moderately hard of hearing, not impacting cerumen, normal tympanic membranes Neck: Normal thyroid, no nodules or adenopathy Heart: 3 out of 6 early systolic murmur best heard at the right upper sternal border, regular Lungs: Unlabored, clear throughout, 3 lumbar spine surgery scars are well-healed Abd: Soft, large cholecystectomy scar is well-healed, nontender Ext: Warm, no edema Feet: Bilateral feet are mildly darker in color, diminished DP pulses bilaterally, diffuse onychomycosis, Neuro: Alert, forgetful, repeats himself with questions, moderately slow get up and go, wide-based unstable gait, slow to get up on the table, full strength upper and lower extremities Psych:  Moderately anxious appearing, distractible at times, loses his train of thought, tangential questions at times, not depressed but.     Assessment & Plan:   Problem List Items Addressed This Visit       High   Type 2 diabetes mellitus with diabetic neuropathy, unspecified (HCC) - Primary (Chronic)   He has long-standing diabetes with neuropathy and foot complications. His A1c is at 5.9%, indicating good control. Discussed the risks of low A1c and the benefits of maintaining it between 6.5-7% to reduce complications and medication side effects. Considering transitioning off Lantus due to low A1c, decreased Lantus to 15 units daily. Continue metformin  at the current dose. Recheck A1c in 3 months. Referred to a podiatrist for foot care and neuropathy management.  If A1c increases as we come off the Lantus, would favor adding on a GLP1 agonist or SGLT2 inhibitor in the future.      Relevant Medications   losartan  (COZAAR ) 100 MG tablet   atorvastatin (LIPITOR) 20 MG tablet   insulin  glargine (LANTUS SOLOSTAR) 100 UNIT/ML Solostar Pen   Spinal stenosis of lumbar region (Chronic)   Chronic lumbar pain and neuropathy persist post-surgery. Pain is not well-controlled with oxycodone  and gabapentin , but Valium  is effective for mood-related discomfort. Pain management is challenging due to lack of response to typical analgesics. Continue Valium  as needed for mood-related discomfort. Focus on maintaining function and quality of life.      Peripheral polyneuropathy (Chronic)   Relevant Medications   diazepam  (VALIUM ) 5 MG tablet   Other Relevant Orders   Ambulatory referral to Podiatry     Medium    Hypertension associated with diabetes (HCC) (Chronic)   Hypertension is managed with losartan . Blood pressure is variable, higher in clinical settings, normal at home. Refilled losartan  100 mg with a 74-month supply and 3 refills.      Relevant Medications   losartan  (COZAAR ) 100 MG tablet    atorvastatin (LIPITOR) 20 MG tablet   insulin  glargine (LANTUS SOLOSTAR) 100 UNIT/ML Solostar Pen   Hyperlipidemia associated with type 2 diabetes mellitus (HCC) (Chronic)   Hyperlipidemia is managed with atorvastatin. Refilled atorvastatin.  This is primary prevention.  He reports having evidence of coronary artery atherosclerosis on prior catheterization, I do not see that record, but has never had an ischemic event.      Relevant Medications   losartan  (COZAAR ) 100 MG tablet   atorvastatin (LIPITOR) 20 MG tablet   insulin  glargine (LANTUS SOLOSTAR) 100 UNIT/ML Solostar Pen   Mood disorder (Chronic)   Chronic and stable.  Seems fairly anxious and perhaps some issues with mild cognitive impairment coming into play.  Still functional.  Has not used an SSRI in the past.  Rarely uses diazepam  as needed for acute anxiety for many years.  There are some risk here that he is  accepting given his advanced age, but he is adamant that this has been a very helpful intervention for him, improving his quality of life, and that he has no side effects.      Relevant Medications   diazepam  (VALIUM ) 5 MG tablet    Return in about 3 months (around 07/10/2024).   Cleatus Debby Specking, MD [facility]

## 2024-04-09 NOTE — Assessment & Plan Note (Signed)
 Hyperlipidemia is managed with atorvastatin. Refilled atorvastatin.  This is primary prevention.  He reports having evidence of coronary artery atherosclerosis on prior catheterization, I do not see that record, but has never had an ischemic event.

## 2024-04-09 NOTE — Assessment & Plan Note (Signed)
 Hypertension is managed with losartan . Blood pressure is variable, higher in clinical settings, normal at home. Refilled losartan  100 mg with a 20-month supply and 3 refills.

## 2024-04-15 ENCOUNTER — Ambulatory Visit: Attending: Cardiology | Admitting: Cardiology

## 2024-04-15 ENCOUNTER — Encounter: Payer: Self-pay | Admitting: Cardiology

## 2024-04-15 VITALS — BP 130/78 | HR 85 | Ht 70.0 in | Wt 179.1 lb

## 2024-04-15 DIAGNOSIS — E1169 Type 2 diabetes mellitus with other specified complication: Secondary | ICD-10-CM | POA: Diagnosis not present

## 2024-04-15 DIAGNOSIS — I10 Essential (primary) hypertension: Secondary | ICD-10-CM

## 2024-04-15 DIAGNOSIS — I2510 Atherosclerotic heart disease of native coronary artery without angina pectoris: Secondary | ICD-10-CM

## 2024-04-15 DIAGNOSIS — E1140 Type 2 diabetes mellitus with diabetic neuropathy, unspecified: Secondary | ICD-10-CM

## 2024-04-15 DIAGNOSIS — I152 Hypertension secondary to endocrine disorders: Secondary | ICD-10-CM

## 2024-04-15 DIAGNOSIS — E785 Hyperlipidemia, unspecified: Secondary | ICD-10-CM

## 2024-04-15 DIAGNOSIS — E1159 Type 2 diabetes mellitus with other circulatory complications: Secondary | ICD-10-CM

## 2024-04-15 MED ORDER — ASPIRIN 81 MG PO TBEC
81.0000 mg | DELAYED_RELEASE_TABLET | Freq: Every day | ORAL | Status: AC
Start: 2024-04-15 — End: ?

## 2024-04-15 NOTE — Progress Notes (Signed)
 Cardiology Office Note:    Date:  04/15/2024   ID:  Benjamin Davila, Benjamin Davila 23-Nov-1940, MRN [id number]  PCP:  Jerrell Cleatus Ned, MD  Cardiologist:  Jennifer JONELLE Crape, MD   Referring MD: Marvene Prentice JONELLE, FNP    ASSESSMENT:    1. Coronary artery disease involving native coronary artery of native heart without angina pectoris   2. Essential hypertension   3. Hypertension associated with diabetes (HCC)   4. Hyperlipidemia associated with type 2 diabetes mellitus (HCC)   5. Type 2 diabetes mellitus with diabetic neuropathy, without long-term current use of insulin  (HCC)    PLAN:    In order of problems listed above:  Severe aortic stenosis: Patient will need to be evaluated.  He has dyspnea on exertion.  He is agreeable for coronary angiography and left heart catheterization.  He will also need coronary angiography in view of history of coronary artery disease on CT scan that was done late last year.  He is agreeable.I discussed coronary angiography and left heart catheterization with the patient at extensive length. Procedure, benefits and potential risks were explained. Patient had multiple questions which were answered to the patient's satisfaction. Patient agreed and consented for the procedure. Further recommendations will be made based on the findings of the coronary angiography. In the interim. The patient has any significant symptoms he knows to go to the nearest emergency room.  He was advised to take a coated baby aspirin  on a daily basis. Essential hypertension: Blood pressure is stable and diet was emphasized. Mixed dyslipidemia: On lipid-lowering medications followed by primary care. Diabetes mellitus: Managed by primary care.  Diet emphasized. He will be seen in a month based on the findings of the aforementioned test.   Medication Adjustments/Labs and Tests Ordered: Current medicines are reviewed at length with the patient today.  Concerns regarding medicines are outlined  above.  Orders Placed This Encounter  Procedures   EKG 12-Lead   No orders of the defined types were placed in this encounter.    No chief complaint on file.    History of Present Illness:    Benjamin Davila is a 83 y.o. male.  Patient has past medical history of coronary artery disease, essential hypertension, mixed dyslipidemia and recently echo has diagnosed severe aortic stenosis.  Patient is a diabetic.  He denies any problems at this time and takes care of activities of daily living.  Overall he leads a sedentary lifestyle.  He does give history of some dyspnea on exertion.  At the time of my evaluation, the patient is alert awake oriented and in no distress.  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   Type 2 diabetes mellitus with diabetic neuropathy, unspecified (HCC) 07/12/2020   Type 2 diabetes mellitus with other specified complication (HCC) 07/12/2020    Past Surgical History:  Procedure Laterality Date   BACK SURGERY     lumbar surgery 20 years ago per patient   CATARACT EXTRACTION     CHOLECYSTECTOMY  20 years ago per patient   COLONOSCOPY     LAMINECTOMY WITH POSTERIOR LATERAL ARTHRODESIS LEVEL 2 Left 03/27/2023   Procedure: Left Lumbar Two-Three, Lumbar Three-Four Hemifacetectomy, posterolateral fusion Lumbar Two-Four, segmental fixation Lumbar Two-Four;  Surgeon: Joshua Alm Hamilton, MD;  Location: [facility] OR;  Service: Neurosurgery;  Laterality: Left;   LUMBAR  LAMINECTOMY/DECOMPRESSION MICRODISCECTOMY Bilateral 08/09/2021   Procedure: Lumbar decompressive laminectomy  - Lumbar two-Lumabr three - Lumbar three-Lumbar four - Lumbar four-Lumbar five - Lumabr five-Sacal one - Bilateral, Posterior Lateral Fusion Lumbar three-four-Right;  Surgeon: Joshua Alm RAMAN, MD;  Location: [facility] OR;  Service: Neurosurgery;  Laterality: Bilateral;   SPINE SURGERY     TONSILLECTOMY     as a kid    Current Medications: Current Meds  Medication Sig   atorvastatin  (LIPITOR) 20 MG tablet Take 1 tablet (20 mg total) by mouth every morning.   diazepam  (VALIUM ) 5 MG tablet Take 1 tablet (5 mg total) by mouth at bedtime as needed for anxiety (moods,and muscle spasm).   ferrous sulfate  325 (65 FE) MG tablet Take 325 mg by mouth daily.   insulin  glargine (LANTUS  SOLOSTAR) 100 UNIT/ML Solostar Pen Inject 15 Units into the skin daily before breakfast.   losartan  (COZAAR ) 100 MG tablet Take 1 tablet (100 mg total) by mouth daily.   metFORMIN  (GLUCOPHAGE ) 500 MG tablet Take 1,000 mg by mouth 2 (two) times a day.   tamsulosin  (FLOMAX ) 0.4 MG CAPS capsule Take 0.4 mg by mouth daily as needed (infrequent urination).     Allergies:   Lisinopril   Social History   Socioeconomic History   Marital status: Married    Spouse name: Not on file   Number of children: Not on file   Years of education: Not on file   Highest education level: Master's degree (e.g., MA, MS, MEng, MEd, MSW, MBA)  Occupational History   Not on file  Tobacco Use   Smoking status: Never   Smokeless tobacco: Never  Vaping Use   Vaping status: Never Used  Substance and Sexual Activity   Alcohol use: Never   Drug use: Never   Sexual activity: Yes  Other Topics Concern   Not on file  Social History Narrative   Not on file   Social Drivers of Health   Financial Resource Strain: Low Risk  (04/08/2024)   Overall Financial Resource Strain (CARDIA)    Difficulty of Paying Living Expenses: Not hard at all   Food Insecurity: No Food Insecurity (04/08/2024)   Hunger Vital Sign    Worried About Running Out of Food in the Last Year: Never true    Ran Out of Food in the Last Year: Never true  Transportation Needs: No Transportation Needs (04/08/2024)   PRAPARE - Administrator, Civil Service (Medical): No    Lack of Transportation (Non-Medical): No  Physical Activity: Insufficiently Active (04/08/2024)   Exercise Vital Sign    Days of Exercise per Week: 2 days    Minutes of Exercise per Session: 10 min  Stress: Stress Concern Present (04/08/2024)   [employer] of Occupational Health - Occupational Stress Questionnaire    Feeling of Stress: To some extent  Social Connections: Socially Isolated (04/08/2024)   Social Connection and Isolation Panel    Frequency of Communication with Friends and Family: Once a week    Frequency of Social Gatherings with Friends and Family: Once a week    Attends Religious Services: Never    Production Manager of  Clubs or Organizations: No    Attends Engineer, Structural: Not on file    Marital Status: Married     Family History: The patient's family history includes Lupus in his mother.  ROS:   Please see the history of present illness.    All other systems reviewed and are negative.  EKGs/Labs/Other Studies Reviewed:    The following studies were reviewed today: .SABRAEKG Interpretation Date/Time:  Wednesday April 15 2024 11:05:58 EST Ventricular Rate:  85 PR Interval:  160 QRS Duration:  138 QT Interval:  416 QTC Calculation: 495 R Axis:   40  Text Interpretation: Normal sinus rhythm Right bundle branch block T wave abnormality, consider lateral ischemia When compared with ECG of 20-Dec-2022 13:51, T wave inversion more evident in Lateral leads Confirmed by Edwyna Backers [phone]) on 04/15/2024 11:07:12 AM     Recent Labs: No results found for requested labs within last 365 days.  Recent Lipid Panel    Component Value  Date/Time   CHOL 116 02/07/2023 1056   TRIG 123 02/07/2023 1056   HDL 43 02/07/2023 1056   CHOLHDL 2.7 02/07/2023 1056   LDLCALC 51 02/07/2023 1056    Physical Exam:    VS:  BP 130/78   Pulse 85   Ht 5' 10 (1.778 m)   Wt 179 lb 1.3 oz (81.2 kg)   SpO2 95%   BMI 25.70 kg/m     Wt Readings from Last 3 Encounters:  04/15/24 179 lb 1.3 oz (81.2 kg)  04/09/24 180 lb (81.6 kg)  11/05/23 183 lb (83 kg)     GEN: Patient is in no acute distress HEENT: Normal NECK: No JVD; No carotid bruits LYMPHATICS: No lymphadenopathy CARDIAC: Hear sounds regular, 2/6 systolic murmur at the apex. RESPIRATORY:  Clear to auscultation without rales, wheezing or rhonchi  ABDOMEN: Soft, non-tender, non-distended MUSCULOSKELETAL:  No edema; No deformity  SKIN: Warm and dry NEUROLOGIC:  Alert and oriented x 3 PSYCHIATRIC:  Normal affect   Signed, Backers JONELLE Edwyna, MD  04/15/2024 11:24 AM    [facility]

## 2024-04-15 NOTE — Patient Instructions (Signed)
 Medication Instructions:  Your physician has recommended you make the following change in your medication:   Take 81 mg coated aspirin  daily. Use nitroglycerin  as needed for chest pain.  *If you need a refill on your cardiac medications before your next appointment, please call your pharmacy*   Lab Work: Your physician recommends that you have a BMET and CBC today in the office for your upcoming procedure.  If you have labs (blood work) drawn today and your tests are completely normal, you will receive your results only by: MyChart Message (if you have MyChart) OR A paper copy in the mail If you have any lab test that is abnormal or we need to change your treatment, we will call you to review the results.   Testing/Procedures:  [facility] [place] [place] [address] [place], [place] [address] [place] [address] Dept: [phone] Loc: [phone]  Benjamin Davila  04/15/2024  You are scheduled for a Cardiac Catheterization on Thursday, December 4 with Dr. Lonni End.  1. Please arrive at the [facility] (Main Entrance A) at [facility]: [address] [place], [place] [address] at 8:30 AM (This time is 2 hour(s) before your procedure to ensure your preparation).   Free valet parking service is available. You will check in at ADMITTING. The support person will be asked to wait in the waiting room.  It is OK to have someone drop you off and come back when you are ready to be discharged.    Special note: Every effort is made to have your procedure done on time. Please understand that emergencies sometimes delay scheduled procedures.  2. Diet: Nothing to eat after midnight.   3. Hydration: You need to be well hydrated before your procedure. On December 4, you may drink approved liquids (see below) until 2 hours before the procedure, with 16 oz of water as your last intake.   List of  approved liquids water, clear juice, clear tea, black coffee, fruit juices, non-citric and without pulp, carbonated beverages, Gatorade, Kool -Aid, plain Jello-O and plain ice popsicles.  4. Labs: You had your blood drawn today.   5. Medication instructions in preparation for your procedure:   Contrast Allergy: No   Stop taking, Cozaar  (Losartan ) Wednesday, December 3,    Do not take Diabetes Med Glucophage  (Metformin ) on the day of the procedure and HOLD 48 HOURS AFTER THE PROCEDURE.  On the morning of your procedure, take your Aspirin  81 mg and any morning medicines NOT listed above.  You may use sips of water.  6. Plan to go home the same day, you will only stay overnight if medically necessary. 7. Bring a current list of your medications and current insurance cards. 8. You MUST have a responsible person to drive you home. 9. Someone MUST be with you the first 24 hours after you arrive home or your discharge will be delayed. 10. Please wear clothes that are easy to get on and off and wear slip-on shoes.  Thank you for allowing us  to care for you!   -- [facility]    Follow-Up: At [facility], you and your health needs are our priority.  As part of our continuing mission to provide you with exceptional heart care, we have created designated Provider Care Teams.  These Care Teams include your primary Cardiologist (physician) and Advanced Practice Providers (APPs -  Physician Assistants  and Nurse Practitioners) who all work together to provide you with the care you need, when you need it.  We recommend signing up for the patient portal called MyChart.  Sign up information is provided on this After Visit Summary.  MyChart is used to connect with patients for Virtual Visits (Telemedicine).  Patients are able to view lab/test results, encounter notes, upcoming appointments, etc.  Non-urgent messages can be sent to your provider as well.   To learn  more about what you can do with MyChart, go to [email or web address].    Your next appointment:   3 month(s)  The format for your next appointment:   In Person  Provider:   Jennifer Crape, MD   Other Instructions  Coronary Angiogram With Stent Coronary angiogram with stent placement is a procedure to widen or open a narrow blood vessel of the heart (coronary artery). Arteries may become blocked by cholesterol buildup (plaques) in the lining of the artery wall. When a coronary artery becomes partially blocked, blood flow to that area decreases. This may lead to chest pain or a heart attack (myocardial infarction). A stent is a small piece of metal that looks like mesh or spring. Stent placement may be done as treatment after a heart attack, or to prevent a heart attack if a blocked artery is found by a coronary angiogram. Let your health care provider know about: Any allergies you have, including allergies to medicines or contrast dye. All medicines you are taking, including vitamins, herbs, eye drops, creams, and over-the-counter medicines. Any problems you or family members have had with anesthetic medicines. Any blood disorders you have. Any surgeries you have had. Any medical conditions you have, including kidney problems or kidney failure. Whether you are pregnant or may be pregnant. Whether you are breastfeeding. What are the risks? Generally, this is a safe procedure. However, serious problems may occur, including: Damage to nearby structures or organs, such as the heart, blood vessels, or kidneys. A return of blockage. Bleeding, infection, or bruising at the insertion site. A collection of blood under the skin (hematoma) at the insertion site. A blood clot in another part of the body. Allergic reaction to medicines or dyes. Bleeding into the abdomen (retroperitoneal bleeding). Stroke (rare). Heart attack (rare). What happens before the procedure? Staying  hydrated Follow instructions from your health care provider about hydration, which may include: Up to 2 hours before the procedure - you may continue to drink clear liquids, such as water, clear fruit juice, black coffee, and plain tea.    Eating and drinking restrictions Follow instructions from your health care provider about eating and drinking, which may include: 8 hours before the procedure - stop eating heavy meals or foods, such as meat, fried foods, or fatty foods. 6 hours before the procedure - stop eating light meals or foods, such as toast or cereal. 2 hours before the procedure - stop drinking clear liquids. Medicines Ask your health care provider about: Changing or stopping your regular medicines. This is especially important if you are taking diabetes medicines or blood thinners. Taking medicines such as aspirin  and ibuprofen. These medicines can thin your blood. Do not take these medicines unless your health care provider tells you to take them. Generally, aspirin  is recommended before a thin tube, called a catheter, is passed through a blood vessel and inserted into the heart (cardiac catheterization). Taking over-the-counter medicines, vitamins, herbs, and supplements. General instructions Do not use any products that contain nicotine or tobacco for  at least 4 weeks before the procedure. These products include cigarettes, e-cigarettes, and chewing tobacco. If you need help quitting, ask your health care provider. Plan to have someone take you home from the hospital or clinic. If you will be going home right after the procedure, plan to have someone with you for 24 hours. You may have tests and imaging procedures. Ask your health care provider: How your insertion site will be marked. Ask which artery will be used for the procedure. What steps will be taken to help prevent infection. These may include: Removing hair at the insertion site. Washing skin with a germ-killing  soap. Taking antibiotic medicine. What happens during the procedure? An IV will be inserted into one of your veins. Electrodes may be placed on your chest to monitor your heart rate during the procedure. You will be given one or more of the following: A medicine to help you relax (sedative). A medicine to numb the area (local anesthetic) for catheter insertion. A small incision will be made for catheter insertion. The catheter will be inserted into an artery using a guide wire. The location may be in your groin, your wrist, or the fold of your arm (near your elbow). An X-ray procedure (fluoroscopy) will be used to help guide the catheter to the opening of the heart arteries. A dye will be injected into the catheter. X-rays will be taken. The dye helps to show where any narrowing or blockages are located in the arteries. Tell your health care provider if you have chest pain or trouble breathing. A tiny wire will be guided to the blocked spot, and a balloon will be inflated to make the artery wider. The stent will be expanded to crush the plaques into the wall of the vessel. The stent will hold the area open and improve the blood flow. Most stents have a drug coating to reduce the risk of the stent narrowing over time. The artery may be made wider using a drill, laser, or other tools that remove plaques. The catheter will be removed when the blood flow improves. The stent will stay where it was placed, and the lining of the artery will grow over it. A bandage (dressing) will be placed on the insertion site. Pressure will be applied to stop bleeding. The IV will be removed. This procedure may vary among health care providers and hospitals.    What happens after the procedure? Your blood pressure, heart rate, breathing rate, and blood oxygen level will be monitored until you leave the hospital or clinic. If the procedure is done through the leg, you will lie flat in bed for a few hours or for as  long as told by your health care provider. You will be instructed not to bend or cross your legs. The insertion site and the pulse in your foot or wrist will be checked often. You may have more blood tests, X-rays, and a test that records the electrical activity of your heart (electrocardiogram, or ECG). Do not drive for 24 hours if you were given a sedative during your procedure. Summary Coronary angiogram with stent placement is a procedure to widen or open a narrowed coronary artery. This is done to treat heart problems. Before the procedure, let your health care provider know about all the medical conditions and surgeries you have or have had. This is a safe procedure. However, some problems may occur, including damage to nearby structures or organs, bleeding, blood clots, or allergies. Follow your health care  provider's instructions about eating, drinking, medicines, and other lifestyle changes, such as quitting tobacco use before the procedure. This information is not intended to replace advice given to you by your health care provider. Make sure you discuss any questions you have with your health care provider. Document Revised: 11/26/2018 Document Reviewed: 11/26/2018 Elsevier Patient Education  2021 Elsevier Inc.  Aspirin  and Your Heart Aspirin  is a medicine that prevents the platelets in your blood from sticking together. Platelets are the cells that your blood uses for clotting. Aspirin  can be used to help reduce the risk of blood clots, heart attacks, and other heart-related problems. What are the risks? Daily use of aspirin  can cause side effects. Some of these include: Bleeding. Bleeding can be minor or serious. An example of minor bleeding is bleeding from a cut, and the bleeding does not stop. An example of more serious bleeding is stomach bleeding or, rarely, bleeding into the brain. Your risk of bleeding increases if you are also taking NSAIDs, such as ibuprofen. Increased  bruising. Upset stomach. An allergic reaction. People who have growths inside the nose (nasal polyps) have an increased risk of developing an aspirin  allergy. How to use aspirin  to care for your heart Take aspirin  only as told by your health care provider. Make sure that you understand how much to take and what form to take. The two forms of aspirin  are: Non-enteric-coated.This type of aspirin  does not have a coating and is absorbed quickly. This type of aspirin  also comes in a chewable form. Enteric-coated. This type of aspirin  has a coating that releases the medicine very slowly. Enteric-coated aspirin  might cause less stomach upset than non-enteric-coated aspirin . This type of aspirin  should not be chewed or crushed. Work with your health care provider to find out whether it is safe and beneficial for you to take aspirin  daily. Taking aspirin  daily may be helpful if: You have had a heart attack or chest pain, or you are at risk for a heart attack. You have a condition in which certain heart vessels are blocked (coronary artery disease), and you have had a procedure to treat it. Examples are: Open-heart surgery, such as coronary artery bypass surgery (CABG). Coronary angioplasty,which is done to widen a blood vessel of your heart. Having a small mesh tube, or stent, placed in your coronary artery. You have had certain types of stroke or a mini-stroke known as a transient ischemic attack (TIA). You have a narrowing of the arteries that supply the limbs (peripheral artery disease, or PAD). You have long-term (chronic) heart rhythm problems, such as atrial fibrillation, and your health care provider thinks aspirin  may help. You have valve disease or have had surgery on a valve. You are considered at increased risk of developing coronary artery disease or PAD.    Follow these instructions at home Medicines Take over-the-counter and prescription medicines only as told by your health care  provider. If you are taking blood thinners: Talk with your health care provider before you take any medicines that contain aspirin  or NSAIDs, such as ibuprofen. These medicines increase your risk for dangerous bleeding. Take your medicine exactly as told, at the same time every day. Avoid activities that could cause injury or bruising, and follow instructions about how to prevent falls. Wear a medical alert bracelet or carry a card that lists what medicines you take. General instructions Do not drink alcohol if: Your health care provider tells you not to drink. You are pregnant, may be pregnant, or  are planning to become pregnant. If you drink alcohol: Limit how much you use to: 0-1 drink a day for women. 0-2 drinks a day for men. Be aware of how much alcohol is in your drink. In the [place] one drink equals one 12 oz bottle of beer (355 mL), one 5 oz glass of wine (148 mL), or one 1 oz glass of hard liquor (44 mL). Keep all follow-up visits as told by your health care provider. This is important. Where to find more information The American Heart Association: www.heart.org Contact a health care provider if you have: Unusual bleeding or bruising. Stomach pain or nausea. Ringing in your ears. An allergic reaction that causes hives, itchy skin, or swelling of the lips, tongue, or face. Get help right away if: You notice that your bowel movements are bloody, or dark red or black in color. You vomit or cough up blood. You have blood in your urine. You cough, breathe loudly (wheeze), or feel short of breath. You have chest pain, especially if the pain spreads to your arms, back, neck, or jaw. You have a headache with confusion. You have any symptoms of a stroke. BE FAST is an easy way to remember the main warning signs of a stroke: B - Balance. Signs are dizziness, sudden trouble walking, or loss of balance. E - Eyes. Signs are trouble seeing or a sudden change in vision. F - Face. Signs  are sudden weakness or numbness of the face, or the face or eyelid drooping on one side. A - Arms. Signs are weakness or numbness in an arm. This happens suddenly and usually on one side of the body. S - Speech. Signs are sudden trouble speaking, slurred speech, or trouble understanding what people say. T - Time. Time to call emergency services. Write down what time symptoms started. You have other signs of a stroke, such as: A sudden, severe headache with no known cause. Nausea or vomiting. Seizure. These symptoms may represent a serious problem that is an emergency. Do not wait to see if the symptoms will go away. Get medical help right away. Call your local emergency services (911 in the [place] Do not drive yourself to the hospital. Summary Aspirin  use can help reduce the risk of blood clots, heart attacks, and other heart-related problems. Daily use of aspirin  can cause side effects. Take aspirin  only as told by your health care provider. Make sure that you understand how much to take and what form to take. Your health care provider will help you determine whether it is safe and beneficial for you to take aspirin  daily. This information is not intended to replace advice given to you by your health care provider. Make sure you discuss any questions you have with your health care provider. Document Revised: 02/09/2019 Document Reviewed: 02/09/2019 Elsevier Patient Education  2021 Elsevier Inc. Nitroglycerin  sublingual tablets What is this medicine? NITROGLYCERIN  (nye troe GLI ser in) is a type of vasodilator. It relaxes blood vessels, increasing the blood and oxygen supply to your heart. This medicine is used to relieve chest pain caused by angina. It is also used to prevent chest pain before activities like climbing stairs, going outdoors in cold weather, or sexual activity. This medicine may be used for other purposes; ask your health care provider or pharmacist if you have  questions. COMMON BRAND NAME(S): Nitroquick, Nitrostat , Nitrotab What should I tell my health care provider before I take this medicine? They need to know if you have any  of these conditions: anemia head injury, recent stroke, or bleeding in the brain liver disease previous heart attack an unusual or allergic reaction to nitroglycerin , other medicines, foods, dyes, or preservatives pregnant or trying to get pregnant breast-feeding How should I use this medicine? Take this medicine by mouth as needed. Use at the first sign of an angina attack (chest pain or tightness). You can also take this medicine 5 to 10 minutes before an event likely to produce chest pain. Follow the directions exactly as written on the prescription label. Place one tablet under your tongue and let it dissolve. Do not swallow whole. Replace the dose if you accidentally swallow it. It will help if your mouth is not dry. Saliva around the tablet will help it to dissolve more quickly. Do not eat or drink, smoke or chew tobacco while a tablet is dissolving. Sit down when taking this medicine. In an angina attack, you should feel better within 5 minutes after your first dose. You can take a dose every 5 minutes up to a total of 3 doses. If you do not feel better or feel worse after 1 dose, call 9-1-1 at once. Do not take more than 3 doses in 15 minutes. Your health care provider might give you other directions. Follow those directions if he or she does. Do not take your medicine more often than directed. Talk to your health care provider about the use of this medicine in children. Special care may be needed. Overdosage: If you think you have taken too much of this medicine contact a poison control center or emergency room at once. NOTE: This medicine is only for you. Do not share this medicine with others. What if I miss a dose? This does not apply. This medicine is only used as needed. What may interact with this medicine? Do not  take this medicine with any of the following medications: certain migraine medicines like ergotamine and dihydroergotamine (DHE) medicines used to treat erectile dysfunction like sildenafil, tadalafil, and vardenafil riociguat This medicine may also interact with the following medications: alteplase aspirin  heparin medicines for high blood pressure medicines for mental depression other medicines used to treat angina phenothiazines like chlorpromazine, mesoridazine, prochlorperazine, thioridazine This list may not describe all possible interactions. Give your health care provider a list of all the medicines, herbs, non-prescription drugs, or dietary supplements you use. Also tell them if you smoke, drink alcohol, or use illegal drugs. Some items may interact with your medicine. What should I watch for while using this medicine? Tell your doctor or health care professional if you feel your medicine is no longer working. Keep this medicine with you at all times. Sit or lie down when you take your medicine to prevent falling if you feel dizzy or faint after using it. Try to remain calm. This will help you to feel better faster. If you feel dizzy, take several deep breaths and lie down with your feet propped up, or bend forward with your head resting between your knees. You may get drowsy or dizzy. Do not drive, use machinery, or do anything that needs mental alertness until you know how this drug affects you. Do not stand or sit up quickly, especially if you are an older patient. This reduces the risk of dizzy or fainting spells. Alcohol can make you more drowsy and dizzy. Avoid alcoholic drinks. Do not treat yourself for coughs, colds, or pain while you are taking this medicine without asking your doctor or health care professional  for advice. Some ingredients may increase your blood pressure. What side effects may I notice from receiving this medicine? Side effects that you should report to your  doctor or health care professional as soon as possible: allergic reactions (skin rash, itching or hives; swelling of the face, lips, or tongue) low blood pressure (dizziness; feeling faint or lightheaded, falls; unusually weak or tired) low red blood cell counts (trouble breathing; feeling faint; lightheaded, falls; unusually weak or tired) Side effects that usually do not require medical attention (report to your doctor or health care professional if they continue or are bothersome): facial flushing (redness) headache nausea, vomiting This list may not describe all possible side effects. Call your doctor for medical advice about side effects. You may report side effects to FDA at 1-800-FDA-1088. Where should I keep my medicine? Keep out of the reach of children. Store at room temperature between 20 and 25 degrees C (68 and 77 degrees F). Store in retail buyer. Protect from light and moisture. Keep tightly closed. Throw away any unused medicine after the expiration date. NOTE: This sheet is a summary. It may not cover all possible information. If you have questions about this medicine, talk to your doctor, pharmacist, or health care provider.  2021 Elsevier/Gold Standard (2018-02-05 16:46:32)

## 2024-04-15 NOTE — H&P (View-Only) (Signed)
 Cardiology Office Note:    Date:  04/15/2024   ID:  Benjamin Davila, Benjamin Davila 23-Nov-1940, MRN [id number]  PCP:  Jerrell Cleatus Ned, MD  Cardiologist:  Jennifer JONELLE Crape, MD   Referring MD: Marvene Prentice JONELLE, FNP    ASSESSMENT:    1. Coronary artery disease involving native coronary artery of native heart without angina pectoris   2. Essential hypertension   3. Hypertension associated with diabetes (HCC)   4. Hyperlipidemia associated with type 2 diabetes mellitus (HCC)   5. Type 2 diabetes mellitus with diabetic neuropathy, without long-term current use of insulin  (HCC)    PLAN:    In order of problems listed above:  Severe aortic stenosis: Patient will need to be evaluated.  He has dyspnea on exertion.  He is agreeable for coronary angiography and left heart catheterization.  He will also need coronary angiography in view of history of coronary artery disease on CT scan that was done late last year.  He is agreeable.I discussed coronary angiography and left heart catheterization with the patient at extensive length. Procedure, benefits and potential risks were explained. Patient had multiple questions which were answered to the patient's satisfaction. Patient agreed and consented for the procedure. Further recommendations will be made based on the findings of the coronary angiography. In the interim. The patient has any significant symptoms he knows to go to the nearest emergency room.  He was advised to take a coated baby aspirin  on a daily basis. Essential hypertension: Blood pressure is stable and diet was emphasized. Mixed dyslipidemia: On lipid-lowering medications followed by primary care. Diabetes mellitus: Managed by primary care.  Diet emphasized. He will be seen in a month based on the findings of the aforementioned test.   Medication Adjustments/Labs and Tests Ordered: Current medicines are reviewed at length with the patient today.  Concerns regarding medicines are outlined  above.  Orders Placed This Encounter  Procedures   EKG 12-Lead   No orders of the defined types were placed in this encounter.    No chief complaint on file.    History of Present Illness:    Benjamin Davila is a 83 y.o. male.  Patient has past medical history of coronary artery disease, essential hypertension, mixed dyslipidemia and recently echo has diagnosed severe aortic stenosis.  Patient is a diabetic.  He denies any problems at this time and takes care of activities of daily living.  Overall he leads a sedentary lifestyle.  He does give history of some dyspnea on exertion.  At the time of my evaluation, the patient is alert awake oriented and in no distress.  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   Type 2 diabetes mellitus with diabetic neuropathy, unspecified (HCC) 07/12/2020   Type 2 diabetes mellitus with other specified complication (HCC) 07/12/2020    Past Surgical History:  Procedure Laterality Date   BACK SURGERY     lumbar surgery 20 years ago per patient   CATARACT EXTRACTION     CHOLECYSTECTOMY  20 years ago per patient   COLONOSCOPY     LAMINECTOMY WITH POSTERIOR LATERAL ARTHRODESIS LEVEL 2 Left 03/27/2023   Procedure: Left Lumbar Two-Three, Lumbar Three-Four Hemifacetectomy, posterolateral fusion Lumbar Two-Four, segmental fixation Lumbar Two-Four;  Surgeon: Joshua Alm Hamilton, MD;  Location: [facility] OR;  Service: Neurosurgery;  Laterality: Left;   LUMBAR  LAMINECTOMY/DECOMPRESSION MICRODISCECTOMY Bilateral 08/09/2021   Procedure: Lumbar decompressive laminectomy  - Lumbar two-Lumabr three - Lumbar three-Lumbar four - Lumbar four-Lumbar five - Lumabr five-Sacal one - Bilateral, Posterior Lateral Fusion Lumbar three-four-Right;  Surgeon: Joshua Alm RAMAN, MD;  Location: [facility] OR;  Service: Neurosurgery;  Laterality: Bilateral;   SPINE SURGERY     TONSILLECTOMY     as a kid    Current Medications: Current Meds  Medication Sig   atorvastatin  (LIPITOR) 20 MG tablet Take 1 tablet (20 mg total) by mouth every morning.   diazepam  (VALIUM ) 5 MG tablet Take 1 tablet (5 mg total) by mouth at bedtime as needed for anxiety (moods,and muscle spasm).   ferrous sulfate  325 (65 FE) MG tablet Take 325 mg by mouth daily.   insulin  glargine (LANTUS  SOLOSTAR) 100 UNIT/ML Solostar Pen Inject 15 Units into the skin daily before breakfast.   losartan  (COZAAR ) 100 MG tablet Take 1 tablet (100 mg total) by mouth daily.   metFORMIN  (GLUCOPHAGE ) 500 MG tablet Take 1,000 mg by mouth 2 (two) times a day.   tamsulosin  (FLOMAX ) 0.4 MG CAPS capsule Take 0.4 mg by mouth daily as needed (infrequent urination).     Allergies:   Lisinopril   Social History   Socioeconomic History   Marital status: Married    Spouse name: Not on file   Number of children: Not on file   Years of education: Not on file   Highest education level: Master's degree (e.g., MA, MS, MEng, MEd, MSW, MBA)  Occupational History   Not on file  Tobacco Use   Smoking status: Never   Smokeless tobacco: Never  Vaping Use   Vaping status: Never Used  Substance and Sexual Activity   Alcohol use: Never   Drug use: Never   Sexual activity: Yes  Other Topics Concern   Not on file  Social History Narrative   Not on file   Social Drivers of Health   Financial Resource Strain: Low Risk  (04/08/2024)   Overall Financial Resource Strain (CARDIA)    Difficulty of Paying Living Expenses: Not hard at all   Food Insecurity: No Food Insecurity (04/08/2024)   Hunger Vital Sign    Worried About Running Out of Food in the Last Year: Never true    Ran Out of Food in the Last Year: Never true  Transportation Needs: No Transportation Needs (04/08/2024)   PRAPARE - Administrator, Civil Service (Medical): No    Lack of Transportation (Non-Medical): No  Physical Activity: Insufficiently Active (04/08/2024)   Exercise Vital Sign    Days of Exercise per Week: 2 days    Minutes of Exercise per Session: 10 min  Stress: Stress Concern Present (04/08/2024)   [employer] of Occupational Health - Occupational Stress Questionnaire    Feeling of Stress: To some extent  Social Connections: Socially Isolated (04/08/2024)   Social Connection and Isolation Panel    Frequency of Communication with Friends and Family: Once a week    Frequency of Social Gatherings with Friends and Family: Once a week    Attends Religious Services: Never    Production Manager of  Clubs or Organizations: No    Attends Engineer, Structural: Not on file    Marital Status: Married     Family History: The patient's family history includes Lupus in his mother.  ROS:   Please see the history of present illness.    All other systems reviewed and are negative.  EKGs/Labs/Other Studies Reviewed:    The following studies were reviewed today: .SABRAEKG Interpretation Date/Time:  Wednesday April 15 2024 11:05:58 EST Ventricular Rate:  85 PR Interval:  160 QRS Duration:  138 QT Interval:  416 QTC Calculation: 495 R Axis:   40  Text Interpretation: Normal sinus rhythm Right bundle branch block T wave abnormality, consider lateral ischemia When compared with ECG of 20-Dec-2022 13:51, T wave inversion more evident in Lateral leads Confirmed by Edwyna Backers [phone]) on 04/15/2024 11:07:12 AM     Recent Labs: No results found for requested labs within last 365 days.  Recent Lipid Panel    Component Value  Date/Time   CHOL 116 02/07/2023 1056   TRIG 123 02/07/2023 1056   HDL 43 02/07/2023 1056   CHOLHDL 2.7 02/07/2023 1056   LDLCALC 51 02/07/2023 1056    Physical Exam:    VS:  BP 130/78   Pulse 85   Ht 5' 10 (1.778 m)   Wt 179 lb 1.3 oz (81.2 kg)   SpO2 95%   BMI 25.70 kg/m     Wt Readings from Last 3 Encounters:  04/15/24 179 lb 1.3 oz (81.2 kg)  04/09/24 180 lb (81.6 kg)  11/05/23 183 lb (83 kg)     GEN: Patient is in no acute distress HEENT: Normal NECK: No JVD; No carotid bruits LYMPHATICS: No lymphadenopathy CARDIAC: Hear sounds regular, 2/6 systolic murmur at the apex. RESPIRATORY:  Clear to auscultation without rales, wheezing or rhonchi  ABDOMEN: Soft, non-tender, non-distended MUSCULOSKELETAL:  No edema; No deformity  SKIN: Warm and dry NEUROLOGIC:  Alert and oriented x 3 PSYCHIATRIC:  Normal affect   Signed, Backers JONELLE Edwyna, MD  04/15/2024 11:24 AM    [facility]

## 2024-04-20 ENCOUNTER — Telehealth: Payer: Self-pay | Admitting: *Deleted

## 2024-04-20 NOTE — Telephone Encounter (Signed)
 Cardiac Catheterization scheduled at [facility] for: Thursday April 23, 2024 10:30 AM Arrival time [facility] Main Entrance A at: 8:30 AM  Diet: -Nothing to eat after midnight.  Hydration: -May drink clear liquids until 2 hours before the procedure.  Approved liquids: Water, clear tea, black coffee, fruit juices-non-citric and without pulp,Gatorade, plain Jello/popsicles.   -Please drink 16 oz of water 2 hours before procedure.  Medication instructions: -Hold:  Insulin -AM of procedure  Metformin -day of procedure and 48 hours after procedure -Other usual morning medications can be taken including aspirin  81 mg.  Plan to go home the same day, you will only stay overnight if medically necessary.  You must have responsible adult to drive you home.  Someone must be with you the first 24 hours after you arrive home.  Reviewed procedure instructions with patient.

## 2024-04-21 ENCOUNTER — Ambulatory Visit: Payer: Self-pay | Admitting: Cardiology

## 2024-04-21 LAB — CBC
Hematocrit: 39.7 % (ref 37.5–51.0)
Hemoglobin: 12.6 g/dL — ABNORMAL LOW (ref 13.0–17.7)
MCH: 31.1 pg (ref 26.6–33.0)
MCHC: 31.7 g/dL (ref 31.5–35.7)
MCV: 98 fL — ABNORMAL HIGH (ref 79–97)
Platelets: 209 x10E3/uL (ref 150–450)
RBC: 4.05 x10E6/uL — ABNORMAL LOW (ref 4.14–5.80)
RDW: 12 % (ref 11.6–15.4)
WBC: 3.8 x10E3/uL (ref 3.4–10.8)

## 2024-04-21 LAB — BASIC METABOLIC PANEL WITH GFR
BUN/Creatinine Ratio: 21 (ref 10–24)
BUN: 17 mg/dL (ref 8–27)
CO2: 24 mmol/L (ref 20–29)
Calcium: 9.8 mg/dL (ref 8.6–10.2)
Chloride: 101 mmol/L (ref 96–106)
Creatinine, Ser: 0.82 mg/dL (ref 0.76–1.27)
Glucose: 168 mg/dL — ABNORMAL HIGH (ref 70–99)
Potassium: 4.3 mmol/L (ref 3.5–5.2)
Sodium: 140 mmol/L (ref 134–144)
eGFR: 87 mL/min/1.73 (ref >59)

## 2024-04-23 ENCOUNTER — Encounter (HOSPITAL_COMMUNITY): Admission: RE | Disposition: A | Payer: Self-pay | Source: Home / Self Care | Attending: Internal Medicine

## 2024-04-23 ENCOUNTER — Ambulatory Visit (HOSPITAL_COMMUNITY)
Admission: RE | Admit: 2024-04-23 | Discharge: 2024-04-23 | Disposition: A | Attending: Internal Medicine | Admitting: Internal Medicine

## 2024-04-23 ENCOUNTER — Other Ambulatory Visit: Payer: Self-pay

## 2024-04-23 DIAGNOSIS — I2510 Atherosclerotic heart disease of native coronary artery without angina pectoris: Secondary | ICD-10-CM

## 2024-04-23 DIAGNOSIS — I10 Essential (primary) hypertension: Secondary | ICD-10-CM

## 2024-04-23 DIAGNOSIS — E1140 Type 2 diabetes mellitus with diabetic neuropathy, unspecified: Secondary | ICD-10-CM

## 2024-04-23 DIAGNOSIS — E1169 Type 2 diabetes mellitus with other specified complication: Secondary | ICD-10-CM

## 2024-04-23 DIAGNOSIS — I350 Nonrheumatic aortic (valve) stenosis: Secondary | ICD-10-CM

## 2024-04-23 DIAGNOSIS — I152 Hypertension secondary to endocrine disorders: Secondary | ICD-10-CM

## 2024-04-23 HISTORY — PX: RIGHT/LEFT HEART CATH AND CORONARY ANGIOGRAPHY: CATH118266

## 2024-04-23 LAB — POCT I-STAT 7, (LYTES, BLD GAS, ICA,H+H)
Acid-Base Excess: 2 mmol/L (ref 0.0–2.0)
Bicarbonate: 26.7 mmol/L (ref 20.0–28.0)
Calcium, Ion: 1.23 mmol/L (ref 1.15–1.40)
HCT: 33 % — ABNORMAL LOW (ref 39.0–52.0)
Hemoglobin: 11.2 g/dL — ABNORMAL LOW (ref 13.0–17.0)
O2 Saturation: 95 %
Potassium: 3.6 mmol/L (ref 3.5–5.1)
Sodium: 140 mmol/L (ref 135–145)
TCO2: 28 mmol/L (ref 22–32)
pCO2 arterial: 41.9 mmHg (ref 32–48)
pH, Arterial: 7.413 (ref 7.35–7.45)
pO2, Arterial: 73 mmHg — ABNORMAL LOW (ref 83–108)

## 2024-04-23 LAB — POCT I-STAT EG7
Acid-Base Excess: 3 mmol/L — ABNORMAL HIGH (ref 0.0–2.0)
Bicarbonate: 28 mmol/L (ref 20.0–28.0)
Calcium, Ion: 1.22 mmol/L (ref 1.15–1.40)
HCT: 33 % — ABNORMAL LOW (ref 39.0–52.0)
Hemoglobin: 11.2 g/dL — ABNORMAL LOW (ref 13.0–17.0)
O2 Saturation: 70 %
Potassium: 3.5 mmol/L (ref 3.5–5.1)
Sodium: 141 mmol/L (ref 135–145)
TCO2: 29 mmol/L (ref 22–32)
pCO2, Ven: 45.4 mmHg (ref 44–60)
pH, Ven: 7.398 (ref 7.25–7.43)
pO2, Ven: 37 mmHg (ref 32–45)

## 2024-04-23 LAB — GLUCOSE, CAPILLARY: Glucose-Capillary: 133 mg/dL — ABNORMAL HIGH (ref 70–99)

## 2024-04-23 MED ORDER — MIDAZOLAM HCL (PF) 2 MG/2ML IJ SOLN
INTRAMUSCULAR | Status: DC | PRN
  Administered 2024-04-23 (×2): 1 mg via INTRAVENOUS

## 2024-04-23 MED ORDER — MIDAZOLAM HCL 2 MG/2ML IJ SOLN
INTRAMUSCULAR | Status: AC
  Filled 2024-04-23: qty 2

## 2024-04-23 MED ORDER — LIDOCAINE HCL (PF) 1 % IJ SOLN
INTRAMUSCULAR | Status: AC
  Filled 2024-04-23: qty 30

## 2024-04-23 MED ORDER — SODIUM CHLORIDE 0.9 % IV SOLN: 250.0000 mL | INTRAVENOUS | Status: DC | PRN

## 2024-04-23 MED ORDER — LABETALOL HCL 5 MG/ML IV SOLN: 10.0000 mg | INTRAVENOUS | Status: DC | PRN

## 2024-04-23 MED ORDER — SODIUM CHLORIDE 0.9% FLUSH: 3.0000 mL | INTRAVENOUS | Status: DC | PRN

## 2024-04-23 MED ORDER — VERAPAMIL HCL 2.5 MG/ML IV SOLN
INTRAVENOUS | Status: DC | PRN
  Administered 2024-04-23 (×2): 10 mL via INTRA_ARTERIAL

## 2024-04-23 MED ORDER — ONDANSETRON HCL 4 MG/2ML IJ SOLN: 4.0000 mg | Freq: Four times a day (QID) | INTRAMUSCULAR | Status: DC | PRN

## 2024-04-23 MED ORDER — LIDOCAINE HCL (PF) 1 % IJ SOLN
INTRAMUSCULAR | Status: DC | PRN
  Administered 2024-04-23 (×3): 2 mL
  Administered 2024-04-23: 5 mL

## 2024-04-23 MED ORDER — HEPARIN (PORCINE) IN NACL 1000-0.9 UT/500ML-% IV SOLN
INTRAVENOUS | Status: DC | PRN
  Administered 2024-04-23: 1000 mL

## 2024-04-23 MED ORDER — HEPARIN SODIUM (PORCINE) 1000 UNIT/ML IJ SOLN
INTRAMUSCULAR | Status: DC | PRN
  Administered 2024-04-23: 4000 [IU] via INTRAVENOUS

## 2024-04-23 MED ORDER — VERAPAMIL HCL 2.5 MG/ML IV SOLN
INTRAVENOUS | Status: AC
  Filled 2024-04-23: qty 2

## 2024-04-23 MED ORDER — ACETAMINOPHEN 325 MG PO TABS: 650.0000 mg | ORAL_TABLET | ORAL | Status: DC | PRN

## 2024-04-23 MED ORDER — HEPARIN SODIUM (PORCINE) 1000 UNIT/ML IJ SOLN
INTRAMUSCULAR | Status: AC
  Filled 2024-04-23: qty 10

## 2024-04-23 MED ORDER — SODIUM CHLORIDE 0.9% FLUSH: 3.0000 mL | Freq: Two times a day (BID) | INTRAVENOUS | Status: DC

## 2024-04-23 MED ORDER — HYDRALAZINE HCL 20 MG/ML IJ SOLN: 10.0000 mg | INTRAMUSCULAR | Status: DC | PRN

## 2024-04-23 MED ORDER — FENTANYL CITRATE (PF) 100 MCG/2ML IJ SOLN
INTRAMUSCULAR | Status: DC | PRN
  Administered 2024-04-23 (×2): 25 ug via INTRAVENOUS

## 2024-04-23 MED ORDER — FREE WATER: 500.0000 mL | Freq: Once | Status: DC

## 2024-04-23 MED ORDER — FENTANYL CITRATE (PF) 100 MCG/2ML IJ SOLN
INTRAMUSCULAR | Status: AC
  Filled 2024-04-23: qty 2

## 2024-04-23 MED ORDER — IOHEXOL 350 MG/ML SOLN
INTRAVENOUS | Status: DC | PRN
  Administered 2024-04-23: 65 mL

## 2024-04-23 MED ORDER — ASPIRIN 81 MG PO CHEW: 81.0000 mg | CHEWABLE_TABLET | ORAL | Status: DC

## 2024-04-23 NOTE — Interval H&P Note (Signed)
 History and Physical Interval Note:  04/23/2024 12:01 PM  Benjamin Davila  has presented today for surgery, with the diagnosis of aortic stenosis and dyspnea on exertion.  The various methods of treatment have been discussed with the patient and family. After consideration of risks, benefits and other options for treatment, the patient has consented to  Procedure(s): RIGHT/LEFT HEART CATH AND CORONARY ANGIOGRAPHY (N/A) as a surgical intervention.  The patient's history has been reviewed, patient examined, no change in status, stable for surgery.  I have reviewed the patient's chart and labs.  Questions were answered to the patient's satisfaction.  Cath Lab Visit (complete for each Cath Lab visit)  Clinical Evaluation Leading to the Procedure:   ACS: No.  Non-ACS:    Anginal/Heart Failure Classification: NYHA class II  Anti-ischemic medical therapy: No Therapy  Non-Invasive Test Results: No non-invasive testing performed  Prior CABG: No previous CABG  Sherree Shankman

## 2024-04-23 NOTE — Discharge Instructions (Signed)
 Drink plenty of fluids for 48 hours and keep wrist elevated at heart level for 24 hours  Radial Site Care   This sheet gives you information about how to care for yourself after your procedure. Your health care provider may also give you more specific instructions. If you have problems or questions, contact your health care provider. What can I expect after the procedure? After the procedure, it is common to have: Bruising and tenderness at the catheter insertion area. Follow these instructions at home: Medicines Take over-the-counter and prescription medicines only as told by your health care provider. Insertion site care Follow instructions from your health care provider about how to take care of your insertion site. Make sure you: Wash your hands with soap and water before you change your bandage (dressing). If soap and water are not available, use hand sanitizer. Remove your dressing as told by your health care provider. In 24 hours Check your insertion site every day for signs of infection. Check for: Redness, swelling, or pain. Fluid or blood. Pus or a bad smell. Warmth. Do not take baths, swim, or use a hot tub until your health care provider approves. You may shower 24-48 hours after the procedure, or as directed by your health care provider. Remove the dressing and gently wash the site with plain soap and water. Pat the area dry with a clean towel. Do not rub the site. That could cause bleeding. Do not apply powder or lotion to the site. Activity   For 24 hours after the procedure, or as directed by your health care provider: Do not flex or bend the affected arm. Do not push or pull heavy objects with the affected arm. Do not drive yourself home from the hospital or clinic. You may drive 24 hours after the procedure unless your health care provider tells you not to. Do not operate machinery or power tools. Do not lift anything that is heavier than 10 lb (4.5 kg), or the  limit that you are told, until your health care provider says that it is safe.  For 4 days Ask your health care provider when it is okay to: Return to work or school. Resume usual physical activities or sports. Resume sexual activity. General instructions If the catheter site starts to bleed, raise your arm and put firm pressure on the site. If the bleeding does not stop, get help right away. This is a medical emergency. If you went home on the same day as your procedure, a responsible adult should be with you for the first 24 hours after you arrive home. Keep all follow-up visits as told by your health care provider. This is important. Contact a health care provider if: You have a fever. You have redness, swelling, or yellow drainage around your insertion site. Get help right away if: You have unusual pain at the radial site. The catheter insertion area swells very fast. The insertion area is bleeding, and the bleeding does not stop when you hold steady pressure on the area. Your arm or hand becomes pale, cool, tingly, or numb. These symptoms may represent a serious problem that is an emergency. Do not wait to see if the symptoms will go away. Get medical help right away. Call your local emergency services (911 in the [place] Do not drive yourself to the hospital. Summary After the procedure, it is common to have bruising and tenderness at the site. Follow instructions from your health care provider about how to take care of your  radial site wound. Check the wound every day for signs of infection. Do not lift anything that is heavier than 10 lb (4.5 kg), or the limit that you are told, until your health care provider says that it is safe. This information is not intended to replace advice given to you by your health care provider. Make sure you discuss any questions you have with your health care provider. Document Revised: 06/12/2017 Document Reviewed: 06/12/2017 Elsevier Patient Education   2020 [employer]Brachial Site Care   This sheet gives you information about how to care for yourself after your procedure. Your health care provider may also give you more specific instructions. If you have problems or questions, contact your health care provider. What can I expect after the procedure? After the procedure, it is common to have: Bruising and tenderness at the catheter insertion area. Follow these instructions at home:  Insertion site care Follow instructions from your health care provider about how to take care of your insertion site. Make sure you: Wash your hands with soap and water before you change your bandage (dressing). If soap and water are not available, use hand sanitizer. Remove your dressing as told by your health care provider. In 24 hours Check your insertion site every day for signs of infection. Check for: Redness, swelling, or pain. Pus or a bad smell. Warmth. You may shower 24-48 hours after the procedure. Do not apply powder or lotion to the site.  Activity For 24 hours after the procedure, or as directed by your health care provider: Do not push or pull heavy objects with the affected arm. Do not drive yourself home from the hospital or clinic. You may drive 24 hours after the procedure unless your health care provider tells you not to. Do not lift anything that is heavier than 10 lb (4.5 kg), or the limit that you are told, until your health care provider says that it is safe.  For 24 hours Femoral Site Care This sheet gives you information about how to care for yourself after your procedure. Your health care provider may also give you more specific instructions. If you have problems or questions, contact your health care provider. What can I expect after the procedure?  After the procedure, it is common to have: Bruising that usually fades within 1-2 weeks. Tenderness at the site. Follow these instructions at home: Wound care Follow  instructions from your health care provider about how to take care of your insertion site. Make sure you: Wash your hands with soap and water before you change your bandage (dressing). If soap and water are not available, use hand sanitizer. Remove your dressing as told by your health care provider. In 24 hours Do not take baths, swim, or use a hot tub until your health care provider approves. You may shower 24-48 hours after the procedure or as told by your health care provider. Gently wash the site with plain soap and water. Pat the area dry with a clean towel. Do not rub the site. This may cause bleeding. Do not apply powder or lotion to the site. Keep the site clean and dry. Check your femoral site every day for signs of infection. Check for: Redness, swelling, or pain. Fluid or blood. Warmth. Pus or a bad smell. Activity For the first 2-3 days after your procedure, or as long as directed: Avoid climbing stairs as much as possible. Do not squat. Do not lift anything that is heavier than 10 lb (4.5 kg),  or the limit that you are told, until your health care provider says that it is safe. For 5 days Rest as directed. Avoid sitting for a long time without moving. Get up to take short walks every 1-2 hours. Do not drive for 24 hours if you were given a medicine to help you relax (sedative). General instructions Take over-the-counter and prescription medicines only as told by your health care provider. Keep all follow-up visits as told by your health care provider. This is important. Contact a health care provider if you have: A fever or chills. You have redness, swelling, or pain around your insertion site. Get help right away if: The catheter insertion area swells very fast. You pass out. You suddenly start to sweat or your skin gets clammy. The catheter insertion area is bleeding, and the bleeding does not stop when you hold steady pressure on the area. The area near or just beyond  the catheter insertion site becomes pale, cool, tingly, or numb. These symptoms may represent a serious problem that is an emergency. Do not wait to see if the symptoms will go away. Get medical help right away. Call your local emergency services (911 in the [place] Do not drive yourself to the hospital. Summary After the procedure, it is common to have bruising that usually fades within 1-2 weeks. Check your femoral site every day for signs of infection. Do not lift anything that is heavier than 10 lb (4.5 kg), or the limit that you are told, until your health care provider says that it is safe. This information is not intended to replace advice given to you by your health care provider. Make sure you discuss any questions you have with your health care provider. Document Revised: 05/20/2017 Document Reviewed: 05/20/2017 Elsevier Patient Education  2020 [employer].

## 2024-04-23 NOTE — Progress Notes (Signed)
 TR band removed at 1520, gauze dressing applied. Right radial level 0, clean, dry and intact. Patient walked to the bathroom without difficulties, right groin level 0.

## 2024-04-24 ENCOUNTER — Encounter (HOSPITAL_COMMUNITY): Payer: Self-pay | Admitting: Internal Medicine

## 2024-05-06 ENCOUNTER — Encounter: Payer: Self-pay | Admitting: Cardiology

## 2024-05-06 DIAGNOSIS — I350 Nonrheumatic aortic (valve) stenosis: Secondary | ICD-10-CM

## 2024-05-07 ENCOUNTER — Ambulatory Visit: Payer: Self-pay | Admitting: Podiatry

## 2024-05-07 VITALS — Ht 70.0 in | Wt 175.0 lb

## 2024-05-07 DIAGNOSIS — M79674 Pain in right toe(s): Secondary | ICD-10-CM

## 2024-05-07 DIAGNOSIS — M79675 Pain in left toe(s): Secondary | ICD-10-CM | POA: Diagnosis not present

## 2024-05-07 DIAGNOSIS — E119 Type 2 diabetes mellitus without complications: Secondary | ICD-10-CM | POA: Diagnosis not present

## 2024-05-07 DIAGNOSIS — B351 Tinea unguium: Secondary | ICD-10-CM | POA: Diagnosis not present

## 2024-05-07 DIAGNOSIS — Z0189 Encounter for other specified special examinations: Secondary | ICD-10-CM

## 2024-05-10 ENCOUNTER — Encounter: Payer: Self-pay | Admitting: Podiatry

## 2024-05-10 NOTE — Progress Notes (Signed)
"  °  Subjective:  Patient ID: Benjamin Davila, male    DOB: 1940/10/16,  MRN: [id number]  Chief Complaint  Patient presents with   Diabetes    RM 20 Np-- diabetic foot eaxam,Peripheral polyneuropathy    83 y.o. male presents with the above complaint. History confirmed with patient.   Objective:  Physical Exam: warm, good capillary refill, normal DP and PT pulses, and reduced sensation at forefoot and toes.  Tinea pedis Left Foot: dystrophic yellowed discolored nail plates with subungual debris Right Foot: dystrophic yellowed discolored nail plates with subungual debris   Assessment:   1. Encounter for diabetic foot exam (HCC)   2. Pain due to onychomycosis of toenails of both feet      Plan:  Patient was evaluated and treated and all questions answered.   Patient educated on diabetes. Discussed proper diabetic foot care and discussed risks and complications of disease. Educated patient in depth on reasons to return to the office immediately should he/she discover anything concerning or new on the feet. All questions answered. Discussed proper shoes as well.    Discussed the etiology and treatment options for the condition in detail with the patient. Recommended debridement of the nails today. Sharp and mechanical debridement performed of all painful and mycotic nails today. Nails debrided in length and thickness using a nail nipper to level of comfort. Follow up as needed for painful nails.    "

## 2024-05-25 ENCOUNTER — Ambulatory Visit: Attending: Cardiovascular Disease | Admitting: Cardiovascular Disease

## 2024-05-25 ENCOUNTER — Encounter: Payer: Self-pay | Admitting: Cardiovascular Disease

## 2024-05-25 ENCOUNTER — Other Ambulatory Visit: Payer: Self-pay

## 2024-05-25 VITALS — BP 144/64 | HR 84 | Ht 69.0 in | Wt 181.3 lb

## 2024-05-25 DIAGNOSIS — I350 Nonrheumatic aortic (valve) stenosis: Secondary | ICD-10-CM | POA: Diagnosis not present

## 2024-05-25 MED ORDER — METOPROLOL TARTRATE 50 MG PO TABS: 50.0000 mg | ORAL_TABLET | Freq: Once | ORAL | 0 refills | Status: DC

## 2024-05-25 NOTE — Progress Notes (Signed)
 " Cardiology Office Note:    Date:  05/25/2024   ID:  Garnette Han, DOB 09/23/1940, MRN [id number]  PCP:  Jerrell Cleatus Ned, MD   [facility] Providers Cardiologist:  Jennifer JONELLE Crape, MD     Referring MD: Jerrell Cleatus Ned, MD   Chief Complaint  Patient presents with   Aortic Stenosis    History of Present Illness:    Jamiah Recore is a 84 y.o. male referred by Dr. Crape for evaluation of aortic stenosis.  The patient is here with his wife today. He reports being told of a heart murmur about 6 or 7 years ago. He had lumbar fusion last year. He feels more limited by back problems. He complains of fatigue and exercise intolerance. He does have a feeling in his throat when he does physical activity that feels like he's been 'out in the cold weather.' He uses a walker with outdoor walking because of his back issues. They stop to rest periodically and his wife has noticed that he seems short of breath, but the patient feels mostly limited by back pain.   An echocardiogram 02/18/2024 demonstrated an LVEF of 70 to 75% with dynamic mid cavitary obstruction peak gradient 41 mmHg and severe aortic stenosis with a mean gradient of 50 mmHg along with mild aortic insufficiency.  The patient had undergone a coronary CTA which showed an aortic valve calcium  score markedly elevated at 4989. The patient underwent cardiac catheterization 04/23/2024 showing moderate single-vessel coronary artery disease with a 60% stenosis in the mid to distal LAD and a 50% ostial diagonal lesion.  The left main, left circumflex, and RCA are all widely patent.  Right heart pressures were essentially normal.  The transaortic valve mean gradient is 78 mmHg.  Medical therapy was recommended for the patient CAD.  He goes the the dentist regularly and has had extensive dental work with cleanings done 3 times per year. He has Type II diabetes and is treated with a combination of metformin  and insulin . He was  hospitalized in 2007 for pancreatitis. Otherwise, he has been pretty healthy over the years.   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   Type 2 diabetes mellitus with diabetic neuropathy, unspecified (HCC) 07/12/2020   Type 2 diabetes mellitus with other specified complication (HCC) 07/12/2020   Past Surgical History:  Procedure Laterality Date   BACK SURGERY     lumbar surgery 20 years ago per patient   CATARACT EXTRACTION     CHOLECYSTECTOMY     20 years ago per patient   COLONOSCOPY     LAMINECTOMY WITH POSTERIOR LATERAL ARTHRODESIS LEVEL 2 Left 03/27/2023   Procedure: Left Lumbar Two-Three, Lumbar Three-Four Hemifacetectomy, posterolateral fusion Lumbar Two-Four, segmental fixation Lumbar Two-Four;  Surgeon: Joshua Alm Hamilton, MD;  Location: [facility] OR;  Service: Neurosurgery;  Laterality: Left;   LUMBAR LAMINECTOMY/DECOMPRESSION MICRODISCECTOMY Bilateral 08/09/2021   Procedure: Lumbar decompressive laminectomy  - Lumbar two-Lumabr three - Lumbar three-Lumbar four - Lumbar four-Lumbar five - Lumabr  five-Sacal one - Bilateral, Posterior Lateral Fusion Lumbar three-four-Right;  Surgeon: Joshua Alm RAMAN, MD;  Location: [facility] OR;  Service: Neurosurgery;  Laterality: Bilateral;   RIGHT/LEFT HEART CATH AND CORONARY ANGIOGRAPHY N/A 04/23/2024   Procedure: RIGHT/LEFT HEART CATH AND CORONARY ANGIOGRAPHY;  Surgeon: Mady Bruckner, MD;  Location: [facility] INVASIVE CV LAB;  Service: Cardiovascular;  Laterality:  N/A;   SPINE SURGERY     TONSILLECTOMY     as a kid    Current Medications: Active Medications[1]   Allergies:   Lisinopril   ROS:   Please see the history of present illness.    All other systems reviewed and are negative.  EKGs/Labs/Other Studies Reviewed:    The following studies were reviewed today: Cardiac Studies & Procedures   ______________________________________________________________________________________________ CARDIAC CATHETERIZATION  CARDIAC CATHETERIZATION 04/23/2024  Conclusion   2nd Diag lesion is 50% stenosed.   Mid LAD to Dist LAD lesion is 60% stenosed.   Mid LAD lesion is 15% stenosed.   LV end diastolic pressure is normal.   There is severe aortic valve stenosis.   In the absence of any other complications or medical issues, we expect the patient to be ready for discharge from a cath perspective on 04/23/2024.   Recommend Aspirin  81mg  daily for moderate CAD.  Conclusions: Moderate single-vessel coronary artery disease with 60% mid/distal LAD stenosis and 50% ostial D2 lesion.  No angiographically significant disease noted in the LMCA, LCx, or RCA. Upper normal left heart filling pressures (LVEDP 15, PCWP 14 mmHg). Upper normal to mildly elevated right heart and pulmonary artery pressures (mean RA 7, RV 40/7, PA 35/15, mean PA 22 mmHg). Normal Fick cardiac output/index (CO 6.2 L/min, CI 3.1 L/min/m). Severe aortic valve stenosis (mean gradient 78 mmHg, AVA 0,7 cm^2; cannot exclude a component of intracavitary/subaortic obstruction contributing to severely elevated LVOT gradient.  Recommendations: Medical therapy and risk factor modification to prevent progression of nonobstructive CAD. Consider structural heart team consultation for further evaluation and management of aortic stenosis.  Bruckner Mady, MD [facility]  Findings Coronary Findings Diagnostic  Dominance: Right  Left Main Vessel is large. Vessel is angiographically  normal.  Left Anterior Descending Vessel is large. Mid LAD lesion is 15% stenosed. The lesion is eccentric. Mid LAD to Dist LAD lesion is 60% stenosed. The lesion is focal and concentric.  First Diagonal Branch Vessel is small in size.  Second Diagonal Branch Vessel is moderate in size. 2nd Diag lesion is 50% stenosed.  Third Diagonal Branch Vessel is small in size.  Left Circumflex Vessel is large. Vessel is angiographically normal.  First Obtuse Marginal Branch Vessel is moderate in size.  Second Obtuse Marginal Branch Vessel is large in size.  Third Obtuse Marginal Branch Vessel is small in size.  Right Coronary Artery Vessel is large. Vessel is angiographically normal.  Right Posterior Descending Artery Vessel is moderate in size.  Right Posterior Atrioventricular Artery Vessel is moderate in size.  First Right Posterolateral Branch Vessel is small in size.  Second Right Posterolateral Branch Vessel is small in size.  Intervention  No interventions have been documented.     ECHOCARDIOGRAM  ECHOCARDIOGRAM COMPLETE 02/18/2024  Narrative ECHOCARDIOGRAM REPORT    Patient Name:   TREVAUN RENDLEMAN Date of Exam: 02/18/2024 Medical Rec #:  [id number]      Height:       70.0 in Accession #:    [id number]     Weight:       183.0 lb Date of Birth:  October 26, 1940     BSA:          2.010 m Patient Age:    82 years       BP:           136/76 mmHg Patient Gender: M              HR:           75 bpm. Exam Location:  [place]  Procedure: 2D Echo, 3D Echo, Cardiac Doppler, Color Doppler and Strain Analysis (Both Spectral and Color Flow Doppler were utilized during procedure).  Indications:    I35.0 Nonrheumatic aortic (valve) stenosis  History:        Patient has prior history of Echocardiogram examinations, most recent 02/05/2023. Aortic Valve Disease; Risk Factors:Hypertension, Diabetes, Dyslipidemia and Non-Smoker.  Sonographer:    Alan Greenhouse RDMS,  RVT, RDCS Referring Phys: [id number] RAJAN R [facility]  IMPRESSIONS   1. Severe aortic valve stenosis. Aortic valve area, by VTI measures 0.94 , mean gradient measures 50.3 mmHg, Vmax measures 4.97 m/s. Mild aortic insufficiency. 2. Dynamic LV mid cavity gradient, peak 41mm Hg. Left ventricular ejection fraction, by estimation, is 70 to 75%. The left ventricle has hyperdynamic function. 3. The left ventricle has no regional wall motion abnormalities. 4. Left ventricular diastolic parameters are consistent with Grade I diastolic dysfunction (impaired relaxation). The average left ventricular global longitudinal strain is -20.6 %. The global longitudinal strain is normal. 5. Right ventricular systolic function is normal. The right ventricular size is normal. 6. The mitral valve is degenerative. Trivial mitral valve regurgitation. No evidence of mitral stenosis. The mean mitral valve gradient is 2.7 mmHg with average heart rate of 66 bpm. 7. The inferior vena cava is normal in size with greater than 50% respiratory variability, suggesting right atrial pressure of 3 mmHg.  Comparison(s): Echocardiogram done 02/05/23 showed an EF of 60-65% with moderate AS and an AV Mean Grad of 39 mmHg.  Conclusion(s)/Recommendation(s): Further evaluation for severe Aortic stenosis as per Cardiology team.  FINDINGS Left Ventricle: Dynamic LV mid cavity gradient, peak 41mm Hg. Left ventricular ejection fraction, by estimation, is 70 to 75%. The left ventricle has hyperdynamic function. The left ventricle has no regional wall motion abnormalities. The average left ventricular global longitudinal strain is -20.6 %. Strain was performed and the global longitudinal strain is normal. The left ventricular internal cavity size was normal in size. There is no left ventricular hypertrophy. Left ventricular diastolic parameters are consistent with Grade I diastolic dysfunction (impaired relaxation).  Right Ventricle: The right  ventricular size is normal. Right vetricular wall thickness was not well visualized. Right ventricular systolic function is normal.  Left Atrium: Left atrial size was normal in size.  Right Atrium: Right atrial size was normal in size.  Pericardium: There is no evidence of pericardial effusion.  Mitral Valve: The mitral valve is degenerative in appearance. Trivial mitral valve regurgitation. No evidence of mitral valve stenosis. The mean mitral valve gradient is 2.7 mmHg with average heart rate of 66 bpm.  Tricuspid Valve: The tricuspid valve is grossly normal. Tricuspid valve regurgitation is trivial. No evidence of tricuspid stenosis.  Aortic Valve: The aortic valve is calcified. Aortic valve regurgitation is mild. Severe aortic stenosis is present. Aortic valve mean gradient measures 50.3 mmHg. Aortic valve peak gradient measures 98.8 mmHg. Aortic valve area, by VTI measures 0.94 cm.  Pulmonic Valve: The pulmonic valve was grossly normal. Pulmonic valve regurgitation is not visualized. No evidence of pulmonic stenosis.  Aorta: The aortic root and ascending aorta are structurally normal, with no evidence of dilitation.  Venous: The inferior vena cava is normal in size with greater than 50% respiratory variability, suggesting right atrial pressure of 3 mmHg.  IAS/Shunts: The interatrial septum was not well visualized.   LEFT VENTRICLE PLAX 2D LVIDd:         3.00 cm     Diastology LVIDs:         1.80 cm     LV e' medial:    3.12 cm/s LV PW:         1.40 cm     LV E/e' medial:  27.4 LV IVS:        1.40 cm     LV e' lateral:   5.75 cm/s LVOT diam:     1.80 cm     LV E/e' lateral: 14.9 LV SV:         95 LV SV Index:   47          2D Longitudinal Strain LVOT Area:     2.54 cm    2D Strain GLS (A4C):   -21.1 % LV IVRT:       111 msec    2D Strain GLS (A3C):   -20.2 % 2D Strain GLS (A2C):   -20.6 % 2D Strain GLS Avg:     -20.6 % LV Volumes (MOD) LV vol d, MOD A2C: 76.1 ml LV vol d,  MOD A4C: 68.9 ml LV vol s, MOD A2C: 16.5 ml 3D Volume EF: LV vol s, MOD A4C: 12.7 ml 3D EF:        58 % LV SV MOD A2C:     59.6 ml LV EDV:       145 ml LV SV MOD A4C:     68.9 ml LV ESV:       61 ml LV SV MOD BP:      62.9 ml LV SV:        84 ml  RIGHT VENTRICLE RV S prime:     9.03 cm/s TAPSE (M-mode): 1.8 cm  LEFT ATRIUM             Index        RIGHT ATRIUM          Index LA diam:        3.60 cm 1.79 cm/m   RA Area:     8.17 cm LA Vol (A2C):   39.3 ml 19.55 ml/m  RA Volume:   12.00 ml 5.97 ml/m LA Vol (A4C):   35.3 ml 17.56 ml/m LA Biplane Vol: 40.6 ml 20.20 ml/m AORTIC VALVE AV Area (Vmax):    0.94 cm AV Area (Vmean):   0.93 cm AV Area (VTI):     0.94 cm AV Vmax:           497.00 cm/s AV Vmean:          327.000 cm/s AV VTI:            1.008 m AV Peak Grad:      98.8 mmHg AV Mean Grad:      50.3 mmHg LVOT Vmax:         184.00 cm/s LVOT Vmean:        120.000 cm/s LVOT VTI:          0.372 m LVOT/AV VTI ratio: 0.37 AR Vena Contracta: 0.30 cm  AORTA Ao Root diam: 3.70 cm Ao Asc diam:  3.50 cm  MITRAL VALVE                TRICUSPID VALVE MV Area (PHT): 2.46 cm     TR Peak grad:   13.7 mmHg MV Mean grad:  2.7 mmHg     TR Vmax:        185.00 cm/s MV Decel Time: 309 msec MR Peak grad: 124.1 mmHg    SHUNTS MR Vmax:      557.00 cm/s   Systemic VTI:  0.37 m MV E velocity: 85.40 cm/s   Systemic Diam: 1.80 cm MV A velocity: 138.00 cm/s MV E/A ratio:  0.62  Sreedhar reddy Madireddy Electronically signed by Alean reddy Madireddy Signature Date/Time: 02/18/2024/11:27:52 PM    Final      CT SCANS  CT CORONARY MORPH W/CTA COR W/SCORE 01/24/2023  Addendum 02/04/2023 11:52 AM ADDENDUM REPORT: 02/04/2023 11:50  EXAM: OVER-READ INTERPRETATION  CT CHEST  The following report is an over-read performed by radiologist Dr. Andrea Gasman of [facility] on 02/04/2023. This over-read does not include interpretation of cardiac or coronary anatomy or  pathology. The coronary CTA interpretation by the cardiologist is attached.  COMPARISON:  None.  FINDINGS: Vascular: Aortic atherosclerosis. The included aorta is normal in caliber.  Mediastinum/nodes: No adenopathy or mass. Unremarkable esophagus.  Lungs: Mild elevation of right hemidiaphragm with compressive atelectasis at the right lung base. No pulmonary nodule. No pleural fluid. The included airways are patent.  Upper abdomen: No acute or unexpected findings.  Musculoskeletal: There are no acute or suspicious osseous abnormalities.  IMPRESSION: 1. Mild elevation of right hemidiaphragm with compressive atelectasis at the right lung base. 2. Aortic Atherosclerosis (ICD10-I70.0).   Electronically Signed By: Andrea Gasman M.D. On: 02/04/2023 11:50  Narrative CLINICAL DATA:  84 Year-old Male  EXAM: Cardiac/Coronary  CTA  TECHNIQUE: The patient was scanned on a [employer].  FINDINGS: Scan was triggered in the descending thoracic aorta. Axial non-contrast 3 mm slices were carried out through the heart. The data set was analyzed on a dedicated work station and scored using the Agatson method. Gantry rotation speed was 250 msecs and collimation was .6 mm. 0.8 mg of sl NTG was given. The 3D data set was reconstructed in 5% intervals of the 67-82 % of the R-R cycle. Diastolic phases were analyzed on a dedicated work station using MPR, MIP and VRT modes. The patient received 95 cc of contrast.  Coronary Arteries:  Normal coronary origin.  Right dominance.  Coronary Calcium  Score:  Left main: 137  Left anterior descending artery: 517  Left circumflex artery: 37  Right coronary artery: 70  Total: 761  Percentile: 62nd for age, sex, and race matched control.  Plaque Analysis:  Left main: 39 Cubic mm calcified plaque, 121 cubic mm non calcified plaque, 2 cubic mm low attenuation plaque, 160 cubic mm total plaque burden.  Left anterior  descending artery: 108 Cubic mm calcified plaque, 399 cubic mm non calcified plaque, 8 cubic mm low attenuation plaque, 507 cubic mm total plaque burden.  Left circumflex artery: 7 Cubic mm calcified plaque, 134 cubic mm non calcified plaque, 1 cubic mm low attenuation plaque, 141 cubic mm total plaque burden.  Right coronary artery: 13 Cubic mm calcified plaque, 119 cubic mm non calcified plaque, 0 cubic mm low attenuation plaque, 132 cubic mm total plaque burden.  Total: 940  RCA is a large dominant artery that gives rise to PDA and PLA. Minimal non-obstructive calcified plaque (1-24%) in the proximal  RCA. Minimal non-obstructive mixed plaques (1-24%) in the distal RCA.  Left main is a large artery that gives rise to LAD and LCX arteries. Minimal non-obstructive calcified plaque (1-24%) in the proximal left main.  LAD is a large vessel that gives rise to multiple diagonal vessels. Mild non-obstructive mixed plaques (25-49%) in proximal LAD. Mild non-obstructive mixed plaques (25-49%) in mid LAD. Mild non-obstructive calcified plaque (25-49%) in distal LAD.  LCX is a non-dominant artery that gives rise to two OM branches. Minimal non-obstructive calcified plaque (1-24%) in the proximal LCX.  Other findings:  Aorta: Normal size.  Aortic atherosclerosis.  No dissection.  Main Pulmonary Artery: Normal size of the pulmonary artery.  Systemic Veins: Normal drainage  Aortic Valve: Tr-leaflet. AV Calcium  score 4989. This can be seen in severe aortic stenosis. Study not optimized for aortic valve assessment.  Mitral valve: Mild Calcification. Calcification of the intervalvular fibrosa. 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  Normal variant pulmonary vein drainage into the left atrium- right middle pulmonary vein.  Normal left atrial appendage without a thrombus.  Interatrial septum with no clear communications  Left Ventricle: Normal size. Severe basal septal  hypertrophy (17 mm). No systolic anterior motion of the mitral valve.  Left Atrium: Normal size  Right Ventricle: Dilated  Right Atrium: Normal size  Pericardium: Normal thickness  Extra-cardiac findings: See attached radiology report for non-cardiac structures.  IMPRESSION: 1. Coronary calcium  score of 761. This was 62nd percentile for age, sex, and race matched control. Total plaque volume 940, over 50th percentile for age and gender.  2. Normal coronary origin with right dominance.  3. CAD-RADS 2. Mild non-obstructive CAD (25-49%). Consider non-atherosclerotic causes of chest pain. Consider preventive therapy and risk factor modification.  4. Aortic valve calcium  score of 4989. Tri-leaflet valve that is somewhat restricted. Consider echocardiogram.  RECOMMENDATIONS: RECOMMENDATIONS The proposed cut-off value of 1,651 AU yielded a 93 % sensitivity and 75 % specificity in grading AS severity in patients with classical low-flow, low-gradient AS. Proposed different cut-off values to define severe AS for men and women as 2,065 AU and 1,274 AU, respectively. The joint European and American recommendations for the assessment of AS consider the aortic valve calcium  score as a continuum - a very high calcium  score suggests severe AS and a low calcium  score suggests severe AS is unlikely.  Donney VEAR Jarome LULLA Delaine RENETTE, et al. 2017 ESC/EACTS Guidelines for the management of valvular heart disease. Eur Heart J 2017;38:2739-91.  Coronary artery calcium  (CAC) score is a strong predictor of incident coronary heart disease (CHD) and provides predictive information beyond traditional risk factors. CAC scoring is reasonable to use in the decision to withhold, postpone, or initiate statin therapy in intermediate-risk or selected borderline-risk asymptomatic adults (age 59-75 years and LDL-C >=70 to <190 mg/dL) who do not have diabetes or established atherosclerotic cardiovascular  disease (ASCVD).* In intermediate-risk (10-year ASCVD risk >=7.5% to <20%) adults or selected borderline-risk (10-year ASCVD risk >=5% to <7.5%) adults in whom a CAC score is measured for the purpose of making a treatment decision the following recommendations have been made:  If CAC = 0, it is reasonable to withhold statin therapy and reassess in 5 to 10 years, as long as higher risk conditions are absent (diabetes mellitus, family history of premature CHD in first degree relatives (males <55 years; females <65 years), cigarette smoking, LDL >=190 mg/dL or other independent risk factors).  If CAC is 1 to 99, it is reasonable to initiate statin  therapy for patients >=44 years of age.  If CAC is >=100 or >=75th percentile, it is reasonable to initiate statin therapy at any age.  Cardiology referral should be considered for patients with CAC scores =400 or >=75th percentile.  *2018 AHA/ACC/AACVPR/AAPA/ABC/ACPM/ADA/AGS/APhA/ASPC/NLA/PCNA Guideline on the Management of Blood Cholesterol: A Report of the American College of Cardiology/American Heart Association Task Force on Clinical Practice Guidelines. J Am Coll Cardiol. 2019;73(24):3168-3209.  Stanly Leavens, MD  Electronically Signed: By: Stanly Leavens M.D. On: 01/24/2023 17:02     ______________________________________________________________________________________________      EKG:        Recent Labs: 04/20/2024: BUN 17; Creatinine, Ser 0.82; Platelets 209 04/23/2024: Hemoglobin 11.2; Potassium 3.6; Sodium 140  Recent Lipid Panel    Component Value Date/Time   CHOL 116 02/07/2023 1056   TRIG 123 02/07/2023 1056   HDL 43 02/07/2023 1056   CHOLHDL 2.7 02/07/2023 1056   LDLCALC 51 02/07/2023 1056        Physical Exam:    VS:  BP (!) 144/64 (BP Location: Right Arm, Patient Position: Sitting, Cuff Size: Large)   Pulse 84   Ht 5' 9 (1.753 m)   Wt 181 lb 4.8 oz (82.2 kg)   SpO2 97%   BMI 26.77  kg/m     Wt Readings from Last 3 Encounters:  05/25/24 181 lb 4.8 oz (82.2 kg)  05/07/24 175 lb (79.4 kg)  04/23/24 175 lb (79.4 kg)     GEN:  Well nourished, well developed in no acute distress HEENT: Normal NECK: No JVD; No carotid bruits LYMPHATICS: No lymphadenopathy CARDIAC: RRR, 3/6 harsh crescendo decrescendo murmur at the right upper sternal border RESPIRATORY:  Clear to auscultation without rales, wheezing or rhonchi  ABDOMEN: Soft, non-tender, non-distended MUSCULOSKELETAL:  No edema; No deformity  SKIN: Warm and dry NEUROLOGIC:  Alert and oriented x 3 PSYCHIATRIC:  Normal affect   Assessment & Plan Nonrheumatic aortic (valve) stenosis The patient has severe, stage D1 aortic stenosis.  This is associated with NYHA functional class II symptoms of fatigue, mild dyspnea, and throat tightness that is likely an anginal equivalent. I have reviewed the natural history of aortic stenosis with the patient and their family members who are present today. We have discussed the limitations of medical therapy and the poor prognosis associated with symptomatic aortic stenosis. We have reviewed potential treatment options, including palliative medical therapy, conventional surgical aortic valve replacement, and transcatheter aortic valve replacement. We discussed treatment options in the context of the patient's specific comorbid medical conditions.  I independently reviewed the patient's echo images, cardiac catheterization images, and previous CT results.  Cardiac catheterization demonstrates nonobstructive coronary artery disease with calcific 60% mid LAD stenosis.  The aortic valve is heavily calcified on fluoroscopy and the mean transaortic gradient is 78 mmHg with a calculated valve area of 0.7 cm.  The patient's echocardiogram shows dynamic LV mid cavitary obstruction with a peak gradient of 41 mmHg and vigorous LVEF of 70 to 75% with severely calcified and restricted aortic valve leaflets  and a mean transaortic gradient of 50 mmHg, V-max of 5 m/s, and mild aortic insufficiency.  I think the patient's coronary artery disease can clearly be treated medically without intervention at this time.  I suspect he will be a suitable candidate for TAVR but he will need to undergo CT angiography studies of the heart as well as the chest, abdomen, and pelvis.  He understands that once his anatomy is defined by CTA studies, he  will be referred for formal cardiac surgical consultation.  His case will be reviewed by our multidisciplinary heart valve team as well.  Today, we reviewed the TAVR procedure in detail including demonstration of a procedural animation and discussion of procedural risks and expected recovery following TAVR.            Medication Adjustments/Labs and Tests Ordered: Current medicines are reviewed at length with the patient today.  Concerns regarding medicines are outlined above.  No orders of the defined types were placed in this encounter.  No orders of the defined types were placed in this encounter.   Patient Instructions  Medication Instructions:  No medication changes were made at this visit. Continue current regimen.   *If you need a refill on your cardiac medications before your next appointment, please call your pharmacy*  Lab Work: None ordered today. If you have labs (blood work) drawn today and your tests are completely normal, you will receive your results only by: MyChart Message (if you have MyChart) OR A paper copy in the mail If you have any lab test that is abnormal or we need to change your treatment, we will call you to review the results.  Testing/Procedures: You will be scheduled for CT scans and a surgical consult. Our office will be calling you to schedule these appointments.   Follow-Up: At [facility], you and your health needs are our priority.  As part of our continuing mission to provide you with exceptional heart care, our  providers are all part of one team.  This team includes your primary Cardiologist (physician) and Advanced Practice Providers or APPs (Physician Assistants and Nurse Practitioners) who all work together to provide you with the care you need, when you need it.  Your next appointment:   Per structural heart team  Provider:   Ozell Fell, MD     Signed, Ozell Fell, MD  05/25/2024 1:48 PM    [facility]     [1]  Current Meds  Medication Sig   Acetaminophen  (TYLENOL  PO) Take 1 tablet by mouth daily as needed (pain).   aspirin  EC 81 MG tablet Take 1 tablet (81 mg total) by mouth daily. Swallow whole.   atorvastatin  (LIPITOR) 20 MG tablet Take 1 tablet (20 mg total) by mouth every morning.   diazepam  (VALIUM ) 5 MG tablet Take 1 tablet (5 mg total) by mouth at bedtime as needed for anxiety (moods,and muscle spasm).   ferrous sulfate  325 (65 FE) MG tablet Take 325 mg by mouth daily.   insulin  glargine (LANTUS  SOLOSTAR) 100 UNIT/ML Solostar Pen Inject 15 Units into the skin daily before breakfast.   KRILL OIL PO Take 1 tablet by mouth daily.   losartan  (COZAAR ) 100 MG tablet Take 1 tablet (100 mg total) by mouth daily.   metFORMIN  (GLUCOPHAGE ) 500 MG tablet Take 1,000 mg by mouth 2 (two) times a day.   Multiple Vitamin (MULTIVITAMIN ADULT PO) Take 1 tablet by mouth daily.   tamsulosin  (FLOMAX ) 0.4 MG CAPS capsule Take 0.4 mg by mouth daily as needed (infrequent urination).   "

## 2024-05-25 NOTE — Progress Notes (Signed)
 Pre Surgical Assessment: 5 M Walk Test  68M=16.30ft  5 Meter Walk Test- trial 1: 3.64 seconds 5 Meter Walk Test- trial 2: 3.59 seconds 5 Meter Walk Test- trial 3: 3.33 seconds 5 Meter Walk Test Average: 3.52 seconds

## 2024-05-25 NOTE — Patient Instructions (Addendum)
 Medication Instructions:  No medication changes were made at this visit. Continue current regimen.   *If you need a refill on your cardiac medications before your next appointment, please call your pharmacy*  Lab Work: None ordered today. If you have labs (blood work) drawn today and your tests are completely normal, you will receive your results only by: MyChart Message (if you have MyChart) OR A paper copy in the mail If you have any lab test that is abnormal or we need to change your treatment, we will call you to review the results.  Testing/Procedures: You will be scheduled for CT scans and a surgical consult. Our office will be calling you to schedule these appointments.   Follow-Up: At [facility], you and your health needs are our priority.  As part of our continuing mission to provide you with exceptional heart care, our providers are all part of one team.  This team includes your primary Cardiologist (physician) and Advanced Practice Providers or APPs (Physician Assistants and Nurse Practitioners) who all work together to provide you with the care you need, when you need it.  Your next appointment:   Per structural heart team  Provider:   Ozell Fell, MD

## 2024-05-26 ENCOUNTER — Ambulatory Visit (HOSPITAL_COMMUNITY)
Admission: RE | Admit: 2024-05-26 | Discharge: 2024-05-26 | Disposition: A | Discharge status: Home / Self Care | Source: Ambulatory Visit | Attending: Cardiovascular Disease | Admitting: Cardiovascular Disease

## 2024-05-26 DIAGNOSIS — I350 Nonrheumatic aortic (valve) stenosis: Secondary | ICD-10-CM | POA: Diagnosis not present

## 2024-05-26 MED ORDER — IOHEXOL 350 MG/ML SOLN
100.0000 mL | Freq: Once | INTRAVENOUS | Status: AC | PRN
  Administered 2024-05-26: 100 mL via INTRAVENOUS

## 2024-05-27 NOTE — Progress Notes (Signed)
 Procedure Type: Isolated AVR Perioperative Outcome Estimate % Operative Mortality 2.07% Morbidity & Mortality 8.14% Stroke 1.75% Renal Failure 1.49% Reoperation 3.31% Prolonged Ventilation 3.05% Deep Sternal Wound Infection 0.062% Long Hospital Stay (>14 days) 4.32% Short Hospital Stay (<6 days)* 43%

## 2024-05-29 ENCOUNTER — Ambulatory Visit: Payer: Self-pay | Admitting: Cardiovascular Disease

## 2024-06-09 ENCOUNTER — Other Ambulatory Visit: Payer: Self-pay

## 2024-06-09 DIAGNOSIS — I350 Nonrheumatic aortic (valve) stenosis: Secondary | ICD-10-CM

## 2024-06-11 ENCOUNTER — Ambulatory Visit: Payer: Self-pay

## 2024-06-11 VITALS — BP 160/80 | HR 99 | Resp 18 | Ht 69.0 in | Wt 180.0 lb

## 2024-06-11 DIAGNOSIS — I350 Nonrheumatic aortic (valve) stenosis: Secondary | ICD-10-CM

## 2024-06-11 NOTE — Progress Notes (Signed)
 Pre Surgical Assessment: 5 M Walk Test  21M=16.25ft  5 Meter Walk Test- trial 1: 7.02 seconds 5 Meter Walk Test- trial 2: 6.59 seconds 5 Meter Walk Test- trial 3: 7.30 seconds 5 Meter Walk Test Average: 6.97 seconds

## 2024-06-11 NOTE — H&P (View-Only) (Signed)
 " [facility]   MULTIDISCIPLINARY HEART VALVE CLINIC    Benjamin Davila [facility] Medical Record #[id number] Date of Birth: Jan 28, 1941  Referring: Edwyna Jennifer SAUNDERS, MD Primary Care: Jerrell Cleatus Ned, MD Primary Cardiologist:Rajan SAUNDERS Edwyna, MD  Chief Complaint:    Chief Complaint  Patient presents with   Aortic Stenosis    TAVR Consult/ review all testing    History of Present Illness:     Benjamin Davila is a 84 y.o. male presents for evaluation of severe aortic stenosis and TAVR.  It was first diagnosed about 4-5 years ago when a physician in [place]  heard a murmur.  It has been followed by his cardiologist and it has been progressively getting more severe.    He reports dyspnea on exertion but attributes this mostly to back pain.  He has had multiple back operations but continues to deal with chronic back pain.  He denies chest pain, palpitations, leg swelling or syncope.  He is pretty functional and independent in his ADLs.  He lives with his wife in a house.  Of note, he reports issues with using urinals following procedures in which he is required to be on strict bedrest.  He has requested to have a Foley placed periprocedural.   My measurements: Annulus - very calcified leaflets, area 582, dia 27.2, peri 87.3 Sinuses - R 37.2, L 39.2, N 37.6 Coronaries - R 19.1, L 17.5 Access - Femorals adequate - left seems better with less posterior calcium .  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   Type 2 diabetes mellitus with diabetic neuropathy, unspecified (HCC) 07/12/2020   Type 2 diabetes mellitus with other specified complication (HCC) 07/12/2020    Past Surgical History:  Procedure Laterality Date   BACK SURGERY     lumbar surgery 20 years ago per patient   CATARACT EXTRACTION     CHOLECYSTECTOMY     20 years ago per patient   COLONOSCOPY     LAMINECTOMY WITH POSTERIOR LATERAL ARTHRODESIS LEVEL 2 Left 03/27/2023   Procedure: Left Lumbar Two-Three, Lumbar Three-Four Hemifacetectomy, posterolateral fusion Lumbar Two-Four, segmental fixation Lumbar Two-Four;  Surgeon: Joshua Alm Hamilton, MD;  Location: [facility] OR;  Service: Neurosurgery;  Laterality: Left;   LUMBAR LAMINECTOMY/DECOMPRESSION MICRODISCECTOMY Bilateral 08/09/2021   Procedure: Lumbar decompressive laminectomy  - Lumbar two-Lumabr three - Lumbar three-Lumbar four - Lumbar four-Lumbar five - Lumabr five-Sacal one - Bilateral, Posterior Lateral Fusion Lumbar three-four-Right;  Surgeon: Joshua Alm RAMAN, MD;  Location: [facility] OR;  Service: Neurosurgery;  Laterality: Bilateral;   RIGHT/LEFT HEART CATH AND CORONARY ANGIOGRAPHY N/A 04/23/2024   Procedure: RIGHT/LEFT HEART CATH AND CORONARY ANGIOGRAPHY;  Surgeon: Mady Bruckner, MD;  Location: [facility] INVASIVE CV LAB;  Service: Cardiovascular;  Laterality: N/A;   SPINE SURGERY     TONSILLECTOMY     as a kid    Social History:  Tobacco Use History[1]  Social History   Substance and Sexual Activity  Alcohol Use Never     Allergies[2]  Current Outpatient Medications  Medication Sig Dispense Refill   acetaminophen  (TYLENOL ) 325 MG tablet Take 650 mg by mouth daily as needed (pain).     aspirin  EC 81 MG tablet Take 1 tablet (81 mg total) by mouth daily. Swallow whole.     atorvastatin  (LIPITOR) 20 MG tablet Take 1 tablet (20 mg total) by mouth every morning. 90 tablet 3   diazepam  (VALIUM ) 5 MG tablet Take 1 tablet (5 mg total) by mouth  at bedtime as needed for anxiety (moods,and muscle spasm). 30 tablet 0   ferrous sulfate  325 (65 FE) MG tablet Take 325 mg by mouth daily.     insulin  glargine (LANTUS  SOLOSTAR) 100 UNIT/ML Solostar Pen Inject 15 Units into the skin daily before breakfast.     KRILL OIL PO Take 1 capsule by mouth daily.     losartan  (COZAAR ) 100 MG tablet Take 1 tablet (100 mg total) by mouth daily. 90 tablet 3   metFORMIN  (GLUCOPHAGE ) 500 MG tablet Take 1,000 mg by mouth 2 (two) times a day.     metoprolol  tartrate (LOPRESSOR ) 50 MG tablet Take 1 tablet (50 mg total) by mouth once. Take 1 tablet by mouth 2 hours prior to CT scan. (Patient not taking: Reported on 06/10/2024) 1 tablet 0   Multiple Vitamin (MULTIVITAMIN ADULT PO) Take 1 tablet by mouth daily.     tamsulosin  (FLOMAX ) 0.4 MG CAPS capsule Take 0.4 mg by mouth daily as needed (infrequent urination).     Testosterone 20.25 MG/1.25GM (1.62%) GEL Place 2 Pump onto the skin daily.     No current facility-administered medications for this visit.    (Not in a hospital admission)   Family History  Problem Relation Age of Onset   Lupus Mother      Review of Systems:   Review of Systems  Constitutional:  Positive for malaise/fatigue. Negative for weight loss.  Respiratory:  Positive for shortness of breath.   Cardiovascular:  Negative for chest pain, palpitations and leg swelling.  Gastrointestinal:  Negative for nausea and vomiting.  Neurological:  Negative for dizziness and headaches.      Physical Exam: BP (!) 160/80   Pulse 99   Resp 18   Ht 5' 9 (1.753 m)   Wt 180 lb (81.6 kg)   SpO2 99% Comment: RA  BMI 26.58 kg/m  Physical Exam Constitutional:      Appearance: Normal appearance.  HENT:     Head: Normocephalic and atraumatic.  Cardiovascular:     Rate and Rhythm: Normal rate and regular rhythm.     Heart sounds: Murmur heard.  Pulmonary:     Effort: Pulmonary effort is normal.  Abdominal:     General: There is distension.      Palpations: Abdomen is soft.     Tenderness: There is no abdominal tenderness.  Musculoskeletal:        General: No swelling.  Skin:    General: Skin is warm and dry.  Neurological:     Mental Status: He is alert.      Cardiac Studies & Procedures   ______________________________________________________________________________________________ CARDIAC CATHETERIZATION  CARDIAC CATHETERIZATION 04/23/2024  Conclusion   2nd Diag lesion is 50% stenosed.   Mid LAD to Dist LAD lesion is 60% stenosed.   Mid LAD lesion is 15% stenosed.   LV end diastolic pressure is normal.   There is severe aortic valve stenosis.   In the absence of any other complications or medical issues, we expect the patient  to be ready for discharge from a cath perspective on 04/23/2024.   Recommend Aspirin  81mg  daily for moderate CAD.  Conclusions: Moderate single-vessel coronary artery disease with 60% mid/distal LAD stenosis and 50% ostial D2 lesion.  No angiographically significant disease noted in the LMCA, LCx, or RCA. Upper normal left heart filling pressures (LVEDP 15, PCWP 14 mmHg). Upper normal to mildly elevated right heart and pulmonary artery pressures (mean RA 7, RV 40/7, PA 35/15, mean PA 22 mmHg). Normal Fick cardiac output/index (CO 6.2 L/min, CI 3.1 L/min/m). Severe aortic valve stenosis (mean gradient 78 mmHg, AVA 0,7 cm^2; cannot exclude a component of intracavitary/subaortic obstruction contributing to severely elevated LVOT gradient.  Recommendations: Medical therapy and risk factor modification to prevent progression of nonobstructive CAD. Consider structural heart team consultation for further evaluation and management of aortic stenosis.  Lonni Hanson, MD [facility]  Findings Coronary Findings Diagnostic  Dominance: Right  Left Main Vessel is large. Vessel is angiographically normal.  Left Anterior Descending Vessel is large. Mid LAD lesion is 15% stenosed. The lesion  is eccentric. Mid LAD to Dist LAD lesion is 60% stenosed. The lesion is focal and concentric.  First Diagonal Branch Vessel is small in size.  Second Diagonal Branch Vessel is moderate in size. 2nd Diag lesion is 50% stenosed.  Third Diagonal Branch Vessel is small in size.  Left Circumflex Vessel is large. Vessel is angiographically normal.  First Obtuse Marginal Branch Vessel is moderate in size.  Second Obtuse Marginal Branch Vessel is large in size.  Third Obtuse Marginal Branch Vessel is small in size.  Right Coronary Artery Vessel is large. Vessel is angiographically normal.  Right Posterior Descending Artery Vessel is moderate in size.  Right Posterior Atrioventricular Artery Vessel is moderate in size.  First Right Posterolateral Branch Vessel is small in size.  Second Right Posterolateral Branch Vessel is small in size.  Intervention  No interventions have been documented.     ECHOCARDIOGRAM  ECHOCARDIOGRAM COMPLETE 02/18/2024  Narrative ECHOCARDIOGRAM REPORT    Patient Name:   Benjamin Davila Date of Exam: 02/18/2024 Medical Rec #:  [id number]      Height:       70.0 in Accession #:    [id number]     Weight:       183.0 lb Date of Birth:  13-Nov-1940     BSA:          2.010 m Patient Age:    84 years       BP:           136/76 mmHg Patient Gender: M              HR:           75 bpm. Exam Location:  [place]  Procedure: 2D Echo, 3D Echo, Cardiac Doppler, Color Doppler and Strain Analysis (Both Spectral and Color Flow Doppler were utilized during procedure).  Indications:    I35.0 Nonrheumatic aortic (valve) stenosis  History:        Patient has prior history of Echocardiogram examinations, most recent 02/05/2023. Aortic Valve Disease; Risk Factors:Hypertension, Diabetes, Dyslipidemia and Non-Smoker.  Sonographer:    Alan Greenhouse RDMS, RVT, RDCS Referring Phys: [id number] RAJAN R [facility]  IMPRESSIONS   1. Severe aortic valve stenosis.  Aortic valve area, by VTI measures 0.94 , mean gradient measures 50.3 mmHg, Vmax measures 4.97 m/s. Mild aortic insufficiency. 2. Dynamic LV mid cavity gradient, peak 41mm Hg. Left ventricular ejection fraction, by estimation, is  70 to 75%. The left ventricle has hyperdynamic function. 3. The left ventricle has no regional wall motion abnormalities. 4. Left ventricular diastolic parameters are consistent with Grade I diastolic dysfunction (impaired relaxation). The average left ventricular global longitudinal strain is -20.6 %. The global longitudinal strain is normal. 5. Right ventricular systolic function is normal. The right ventricular size is normal. 6. The mitral valve is degenerative. Trivial mitral valve regurgitation. No evidence of mitral stenosis. The mean mitral valve gradient is 2.7 mmHg with average heart rate of 66 bpm. 7. The inferior vena cava is normal in size with greater than 50% respiratory variability, suggesting right atrial pressure of 3 mmHg.  Comparison(s): Echocardiogram done 02/05/23 showed an EF of 60-65% with moderate AS and an AV Mean Grad of 39 mmHg.  Conclusion(s)/Recommendation(s): Further evaluation for severe Aortic stenosis as per Cardiology team.  FINDINGS Left Ventricle: Dynamic LV mid cavity gradient, peak 41mm Hg. Left ventricular ejection fraction, by estimation, is 70 to 75%. The left ventricle has hyperdynamic function. The left ventricle has no regional wall motion abnormalities. The average left ventricular global longitudinal strain is -20.6 %. Strain was performed and the global longitudinal strain is normal. The left ventricular internal cavity size was normal in size. There is no left ventricular hypertrophy. Left ventricular diastolic parameters are consistent with Grade I diastolic dysfunction (impaired relaxation).  Right Ventricle: The right ventricular size is normal. Right vetricular wall thickness was not well visualized. Right ventricular  systolic function is normal.  Left Atrium: Left atrial size was normal in size.  Right Atrium: Right atrial size was normal in size.  Pericardium: There is no evidence of pericardial effusion.  Mitral Valve: The mitral valve is degenerative in appearance. Trivial mitral valve regurgitation. No evidence of mitral valve stenosis. The mean mitral valve gradient is 2.7 mmHg with average heart rate of 66 bpm.  Tricuspid Valve: The tricuspid valve is grossly normal. Tricuspid valve regurgitation is trivial. No evidence of tricuspid stenosis.  Aortic Valve: The aortic valve is calcified. Aortic valve regurgitation is mild. Severe aortic stenosis is present. Aortic valve mean gradient measures 50.3 mmHg. Aortic valve peak gradient measures 98.8 mmHg. Aortic valve area, by VTI measures 0.94 cm.  Pulmonic Valve: The pulmonic valve was grossly normal. Pulmonic valve regurgitation is not visualized. No evidence of pulmonic stenosis.  Aorta: The aortic root and ascending aorta are structurally normal, with no evidence of dilitation.  Venous: The inferior vena cava is normal in size with greater than 50% respiratory variability, suggesting right atrial pressure of 3 mmHg.  IAS/Shunts: The interatrial septum was not well visualized.   LEFT VENTRICLE PLAX 2D LVIDd:         3.00 cm     Diastology LVIDs:         1.80 cm     LV e' medial:    3.12 cm/s LV PW:         1.40 cm     LV E/e' medial:  27.4 LV IVS:        1.40 cm     LV e' lateral:   5.75 cm/s LVOT diam:     1.80 cm     LV E/e' lateral: 14.9 LV SV:         95 LV SV Index:   47          2D Longitudinal Strain LVOT Area:     2.54 cm    2D Strain GLS (A4C):   -21.1 %  LV IVRT:       111 msec    2D Strain GLS (A3C):   -20.2 % 2D Strain GLS (A2C):   -20.6 % 2D Strain GLS Avg:     -20.6 % LV Volumes (MOD) LV vol d, MOD A2C: 76.1 ml LV vol d, MOD A4C: 68.9 ml LV vol s, MOD A2C: 16.5 ml 3D Volume EF: LV vol s, MOD A4C: 12.7 ml 3D EF:        58  % LV SV MOD A2C:     59.6 ml LV EDV:       145 ml LV SV MOD A4C:     68.9 ml LV ESV:       61 ml LV SV MOD BP:      62.9 ml LV SV:        84 ml  RIGHT VENTRICLE RV S prime:     9.03 cm/s TAPSE (M-mode): 1.8 cm  LEFT ATRIUM             Index        RIGHT ATRIUM          Index LA diam:        3.60 cm 1.79 cm/m   RA Area:     8.17 cm LA Vol (A2C):   39.3 ml 19.55 ml/m  RA Volume:   12.00 ml 5.97 ml/m LA Vol (A4C):   35.3 ml 17.56 ml/m LA Biplane Vol: 40.6 ml 20.20 ml/m AORTIC VALVE AV Area (Vmax):    0.94 cm AV Area (Vmean):   0.93 cm AV Area (VTI):     0.94 cm AV Vmax:           497.00 cm/s AV Vmean:          327.000 cm/s AV VTI:            1.008 m AV Peak Grad:      98.8 mmHg AV Mean Grad:      50.3 mmHg LVOT Vmax:         184.00 cm/s LVOT Vmean:        120.000 cm/s LVOT VTI:          0.372 m LVOT/AV VTI ratio: 0.37 AR Vena Contracta: 0.30 cm  AORTA Ao Root diam: 3.70 cm Ao Asc diam:  3.50 cm  MITRAL VALVE                TRICUSPID VALVE MV Area (PHT): 2.46 cm     TR Peak grad:   13.7 mmHg MV Mean grad:  2.7 mmHg     TR Vmax:        185.00 cm/s MV Decel Time: 309 msec MR Peak grad: 124.1 mmHg    SHUNTS MR Vmax:      557.00 cm/s   Systemic VTI:  0.37 m MV E velocity: 85.40 cm/s   Systemic Diam: 1.80 cm MV A velocity: 138.00 cm/s MV E/A ratio:  0.62  Sreedhar reddy Madireddy Electronically signed by Alean reddy Madireddy Signature Date/Time: 02/18/2024/11:27:52 PM    Final      CT SCANS  CT CORONARY MORPH W/CTA COR W/SCORE 05/26/2024  Addendum 06/06/2024 10:10 PM ADDENDUM REPORT: 06/06/2024 22:07  EXAM: OVER-READ INTERPRETATION  CT CHEST  The following report is an over-read performed by radiologist Dr. Franky Leff [facility] on 06/06/2024. This over-read does not include interpretation of cardiac or coronary anatomy or pathology. The coronary CTA interpretation by the cardiologist is attached.  COMPARISON:  Comparison  01/24/2023.  FINDINGS: Heart is normal size. Aorta normal caliber. Extensive aortic valve calcifications. Calcifications in the ascending thoracic aorta. No confluent airspace opacities or effusions. No acute findings in the upper abdomen. Chest wall soft tissues are unremarkable. No acute bony abnormality.  IMPRESSION: No acute extra cardiac abnormality.  Aortic atherosclerosis.   Electronically Signed By: Franky Crease M.D. On: 06/06/2024 22:07  Narrative CLINICAL DATA:  Severe Aortic Stenosis.  EXAM: Cardiac TAVR CT  TECHNIQUE: A non-contrast, gated CT scan was obtained with axial slices of 2.5 mm through the heart for aortic valve scoring. A 100 kV retrospective, gated, contrast cardiac scan was obtained. Gantry rotation speed was 230 msec and collimation was 0.63 mm. Nitroglycerin  was not given. The 3D dataset was reconstructed in systole with motion correction. The 3D data set was reconstructed in 5% intervals of the 0-95% of the R-R cycle. Systolic and diastolic phases were analyzed on a dedicated workstation using MPR, MIP, and VRT modes. The patient received 100 cc of contrast.  FINDINGS: Image quality: Excellent.  Noise artifact is: Limited.  Valve Morphology: Tricuspid aortic valve with severe diffuse calcifications. Severely restricted leaflet motion in systole. Bulky calcification of the NCC/RCC.  Aortic Valve Calcium  score: 5254  Aortic annular dimension:  Phase assessed: 25%  Annular area: 592 mm2  Annular perimeter: 88.2 mm  Max diameter: 32.3 mm  Min diameter: 24.4 mm  Annular and subannular calcification: Mild, single protruding calcification under the NCC extending into the LVOT. Mild, single, layered calcification under the LCC.  Membranous septum length: 12.3 mm  Optimal coplanar projection: LAO 9 CRA 3  Coronary Artery Height above Annulus:  Left Main: 18.1 mm  Right Coronary: 20.1 mm  Sinus of Valsalva  Measurements:  Non-coronary: 38 mm  Right-coronary: 37 mm  Left-coronary: 38 mm  Sinus of Valsalva Height:  Non-coronary: 26.8 mm  Right-coronary: 23.9 mm  Left-coronary: 24.5 mm  Sinotubular Junction: 30 mm  Ascending Thoracic Aorta: 36 mm  Coronary Arteries: Normal coronary origin. Right dominance. The study was performed without use of NTG and is insufficient for plaque evaluation. Please refer to recent cardiac catheterization for coronary assessment.  Cardiac Morphology:  Right Atrium: Right atrial size is within normal limits.  Right Ventricle: The right ventricular cavity is within normal limits.  Left Atrium: Left atrial size is normal in size with no left atrial appendage filling defect.  Left Ventricle: The ventricular cavity size is within normal limits.  Pulmonary arteries: Normal in size without proximal filling defect.  Pulmonary veins: Normal pulmonary venous drainage.  Pericardium: Normal thickness with no significant effusion or calcium  present.  Mitral Valve: The mitral valve is normal structure without significant calcification.  Extra-cardiac findings: See attached radiology report for non-cardiac structures.  IMPRESSION: 1. Annular measurements support a 29 mm S3 or 34 mm Evolut Pro.  2. Mild, single protruding calcification under the NCC extending into the LVOT. Mild, single, layered calcification under the LCC.  3. Sufficient coronary to annulus distance.  4. Optimal Fluoroscopic Angle for Delivery: LAO 9 CRA 3  Rushmore T. Barbaraann, MD  Electronically Signed: By: Darryle Barbaraann M.D. On: 05/26/2024 18:57   CT SCANS  CT CORONARY MORPH W/CTA COR W/SCORE 01/24/2023  Addendum 02/04/2023 11:52 AM ADDENDUM REPORT: 02/04/2023 11:50  EXAM: OVER-READ INTERPRETATION  CT CHEST  The following report is an over-read performed by radiologist Dr. Andrea Gasman of [facility] on 02/04/2023. This over-read does not include  interpretation of cardiac or coronary  anatomy or pathology. The coronary CTA interpretation by the cardiologist is attached.  COMPARISON:  None.  FINDINGS: Vascular: Aortic atherosclerosis. The included aorta is normal in caliber.  Mediastinum/nodes: No adenopathy or mass. Unremarkable esophagus.  Lungs: Mild elevation of right hemidiaphragm with compressive atelectasis at the right lung base. No pulmonary nodule. No pleural fluid. The included airways are patent.  Upper abdomen: No acute or unexpected findings.  Musculoskeletal: There are no acute or suspicious osseous abnormalities.  IMPRESSION: 1. Mild elevation of right hemidiaphragm with compressive atelectasis at the right lung base. 2. Aortic Atherosclerosis (ICD10-I70.0).   Electronically Signed By: Andrea Gasman M.D. On: 02/04/2023 11:50  Narrative CLINICAL DATA:  84 Year-old Male  EXAM: Cardiac/Coronary  CTA  TECHNIQUE: The patient was scanned on a [employer].  FINDINGS: Scan was triggered in the descending thoracic aorta. Axial non-contrast 3 mm slices were carried out through the heart. The data set was analyzed on a dedicated work station and scored using the Agatson method. Gantry rotation speed was 250 msecs and collimation was .6 mm. 0.8 mg of sl NTG was given. The 3D data set was reconstructed in 5% intervals of the 67-82 % of the R-R cycle. Diastolic phases were analyzed on a dedicated work station using MPR, MIP and VRT modes. The patient received 95 cc of contrast.  Coronary Arteries:  Normal coronary origin.  Right dominance.  Coronary Calcium  Score:  Left main: 137  Left anterior descending artery: 517  Left circumflex artery: 37  Right coronary artery: 70  Total: 761  Percentile: 62nd for age, sex, and race matched control.  Plaque Analysis:  Left main: 39 Cubic mm calcified plaque, 121 cubic mm non calcified plaque, 2 cubic mm low attenuation plaque, 160 cubic  mm total plaque burden.  Left anterior descending artery: 108 Cubic mm calcified plaque, 399 cubic mm non calcified plaque, 8 cubic mm low attenuation plaque, 507 cubic mm total plaque burden.  Left circumflex artery: 7 Cubic mm calcified plaque, 134 cubic mm non calcified plaque, 1 cubic mm low attenuation plaque, 141 cubic mm total plaque burden.  Right coronary artery: 13 Cubic mm calcified plaque, 119 cubic mm non calcified plaque, 0 cubic mm low attenuation plaque, 132 cubic mm total plaque burden.  Total: 940  RCA is a large dominant artery that gives rise to PDA and PLA. Minimal non-obstructive calcified plaque (1-24%) in the proximal RCA. Minimal non-obstructive mixed plaques (1-24%) in the distal RCA.  Left main is a large artery that gives rise to LAD and LCX arteries. Minimal non-obstructive calcified plaque (1-24%) in the proximal left main.  LAD is a large vessel that gives rise to multiple diagonal vessels. Mild non-obstructive mixed plaques (25-49%) in proximal LAD. Mild non-obstructive mixed plaques (25-49%) in mid LAD. Mild non-obstructive calcified plaque (25-49%) in distal LAD.  LCX is a non-dominant artery that gives rise to two OM branches. Minimal non-obstructive calcified plaque (1-24%) in the proximal LCX.  Other findings:  Aorta: Normal size.  Aortic atherosclerosis.  No dissection.  Main Pulmonary Artery: Normal size of the pulmonary artery.  Systemic Veins: Normal drainage  Aortic Valve: Tr-leaflet. AV Calcium  score 4989. This can be seen in severe aortic stenosis. Study not optimized for aortic valve assessment.  Mitral valve: Mild Calcification. Calcification of the intervalvular fibrosa. 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  Normal variant pulmonary vein drainage into the left atrium- right middle pulmonary vein.  Normal left atrial appendage without a thrombus.  Interatrial  septum with no clear communications  Left  Ventricle: Normal size. Severe basal septal hypertrophy (17 mm). No systolic anterior motion of the mitral valve.  Left Atrium: Normal size  Right Ventricle: Dilated  Right Atrium: Normal size  Pericardium: Normal thickness  Extra-cardiac findings: See attached radiology report for non-cardiac structures.  IMPRESSION: 1. Coronary calcium  score of 761. This was 62nd percentile for age, sex, and race matched control. Total plaque volume 940, over 50th percentile for age and gender.  2. Normal coronary origin with right dominance.  3. CAD-RADS 2. Mild non-obstructive CAD (25-49%). Consider non-atherosclerotic causes of chest pain. Consider preventive therapy and risk factor modification.  4. Aortic valve calcium  score of 4989. Tri-leaflet valve that is somewhat restricted. Consider echocardiogram.  RECOMMENDATIONS: RECOMMENDATIONS The proposed cut-off value of 1,651 AU yielded a 93 % sensitivity and 75 % specificity in grading AS severity in patients with classical low-flow, low-gradient AS. Proposed different cut-off values to define severe AS for men and women as 2,065 AU and 1,274 AU, respectively. The joint European and American recommendations for the assessment of AS consider the aortic valve calcium  score as a continuum - a very high calcium  score suggests severe AS and a low calcium  score suggests severe AS is unlikely.  Donney VEAR Jarome LULLA Essa RENETTE, et al. 2017 ESC/EACTS Guidelines for the management of valvular heart disease. Eur Heart J 2017;38:2739-91.  Coronary artery calcium  (CAC) score is a strong predictor of incident coronary heart disease (CHD) and provides predictive information beyond traditional risk factors. CAC scoring is reasonable to use in the decision to withhold, postpone, or initiate statin therapy in intermediate-risk or selected borderline-risk asymptomatic adults (age 50-75 years and LDL-C >=70 to <190 mg/dL) who do not have diabetes or  established atherosclerotic cardiovascular disease (ASCVD).* In intermediate-risk (10-year ASCVD risk >=7.5% to <20%) adults or selected borderline-risk (10-year ASCVD risk >=5% to <7.5%) adults in whom a CAC score is measured for the purpose of making a treatment decision the following recommendations have been made:  If CAC = 0, it is reasonable to withhold statin therapy and reassess in 5 to 10 years, as long as higher risk conditions are absent (diabetes mellitus, family history of premature CHD in first degree relatives (males <55 years; females <65 years), cigarette smoking, LDL >=190 mg/dL or other independent risk factors).  If CAC is 1 to 99, it is reasonable to initiate statin therapy for patients >=35 years of age.  If CAC is >=100 or >=75th percentile, it is reasonable to initiate statin therapy at any age.  Cardiology referral should be considered for patients with CAC scores =400 or >=75th percentile.  *2018 AHA/ACC/AACVPR/AAPA/ABC/ACPM/ADA/AGS/APhA/ASPC/NLA/PCNA Guideline on the Management of Blood Cholesterol: A Report of the American College of Cardiology/American Heart Association Task Force on Clinical Practice Guidelines. J Am Coll Cardiol. 2019;73(24):3168-3209.  Stanly Leavens, MD  Electronically Signed: By: Stanly Leavens M.D. On: 01/24/2023 17:02     ______________________________________________________________________________________________      ECG NSR, RBBB    I have independently reviewed the above radiologic studies and discussed with the patient   Recent Lab Findings: Lab Results  Component Value Date   WBC 3.8 04/20/2024   HGB 11.2 (L) 04/23/2024   HCT 33.0 (L) 04/23/2024   PLT 209 04/20/2024   GLUCOSE 168 (H) 04/20/2024   CHOL 116 02/07/2023   TRIG 123 02/07/2023   HDL 43 02/07/2023   LDLCALC 51 02/07/2023   ALT 25 02/07/2023   AST 23 02/07/2023  NA 140 04/23/2024   K 3.6 04/23/2024   CL 101 04/20/2024    CREATININE 0.82 04/20/2024   BUN 17 04/20/2024   CO2 24 04/20/2024   INR 1.1 03/18/2023   HGBA1C 5.9 (A) 04/09/2024      Assessment / Plan:   84 y.o. male with severe aortic stenosis.   Will plan to put a Foley in before the TAVR per his request, given history of difficulties using a urinal while on bedrest.  We discussed his higher-than-average risk of need for permanent pacemaker given pre-existing right bundle branch block.  He and his wife had several questions and concerns about this but have a good understanding of the situation at this point.  STS score: 2.07%.  NYHA Class II.  The risks and benefits of transfemoral TAVR were discussed in detail.  The risks included death, stroke, paravalvular leak, aortic dissection, annulus rupture, device embolization, acute myocardial infarction, arrhythmia, heart block or need for permanent pacemaker.  We also discussed possibility of an emergent sternotomy to address any procedural complications.  Based on our discussion, we collectively decided that an emergent sternotomy would be indicated.  The patient is agreeable to proceed.  Based on my review of his LHC, echo, and CTA, I agree with the multidisciplinary plan to proceed with a 29 mm S3 transfemoral TAVR.  Bailout candidate.  I  spent 30 minutes counseling the patient face to face.   Con RAMAN Jahmir Salo 06/11/2024 10:32 AM      [1]  Social History Tobacco Use  Smoking Status Never  Smokeless Tobacco Never  [2]  Allergies Allergen Reactions   Lisinopril Cough   "

## 2024-06-11 NOTE — Progress Notes (Signed)
 " [facility]   MULTIDISCIPLINARY HEART VALVE CLINIC    Zaniel [facility] Medical Record #[id number] Date of Birth: Jan 28, 1941  Referring: Edwyna Jennifer SAUNDERS, MD Primary Care: Jerrell Cleatus Ned, MD Primary Cardiologist:Rajan SAUNDERS Edwyna, MD  Chief Complaint:    Chief Complaint  Patient presents with   Aortic Stenosis    TAVR Consult/ review all testing    History of Present Illness:     Keefe Zawistowski is a 84 y.o. male presents for evaluation of severe aortic stenosis and TAVR.  It was first diagnosed about 4-5 years ago when a physician in [place]  heard a murmur.  It has been followed by his cardiologist and it has been progressively getting more severe.    He reports dyspnea on exertion but attributes this mostly to back pain.  He has had multiple back operations but continues to deal with chronic back pain.  He denies chest pain, palpitations, leg swelling or syncope.  He is pretty functional and independent in his ADLs.  He lives with his wife in a house.  Of note, he reports issues with using urinals following procedures in which he is required to be on strict bedrest.  He has requested to have a Foley placed periprocedural.   My measurements: Annulus - very calcified leaflets, area 582, dia 27.2, peri 87.3 Sinuses - R 37.2, L 39.2, N 37.6 Coronaries - R 19.1, L 17.5 Access - Femorals adequate - left seems better with less posterior calcium .  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   Type 2 diabetes mellitus with diabetic neuropathy, unspecified (HCC) 07/12/2020   Type 2 diabetes mellitus with other specified complication (HCC) 07/12/2020    Past Surgical History:  Procedure Laterality Date   BACK SURGERY     lumbar surgery 20 years ago per patient   CATARACT EXTRACTION     CHOLECYSTECTOMY     20 years ago per patient   COLONOSCOPY     LAMINECTOMY WITH POSTERIOR LATERAL ARTHRODESIS LEVEL 2 Left 03/27/2023   Procedure: Left Lumbar Two-Three, Lumbar Three-Four Hemifacetectomy, posterolateral fusion Lumbar Two-Four, segmental fixation Lumbar Two-Four;  Surgeon: Joshua Alm Hamilton, MD;  Location: [facility] OR;  Service: Neurosurgery;  Laterality: Left;   LUMBAR LAMINECTOMY/DECOMPRESSION MICRODISCECTOMY Bilateral 08/09/2021   Procedure: Lumbar decompressive laminectomy  - Lumbar two-Lumabr three - Lumbar three-Lumbar four - Lumbar four-Lumbar five - Lumabr five-Sacal one - Bilateral, Posterior Lateral Fusion Lumbar three-four-Right;  Surgeon: Joshua Alm RAMAN, MD;  Location: [facility] OR;  Service: Neurosurgery;  Laterality: Bilateral;   RIGHT/LEFT HEART CATH AND CORONARY ANGIOGRAPHY N/A 04/23/2024   Procedure: RIGHT/LEFT HEART CATH AND CORONARY ANGIOGRAPHY;  Surgeon: Mady Bruckner, MD;  Location: [facility] INVASIVE CV LAB;  Service: Cardiovascular;  Laterality: N/A;   SPINE SURGERY     TONSILLECTOMY     as a kid    Social History:  Tobacco Use History[1]  Social History   Substance and Sexual Activity  Alcohol Use Never     Allergies[2]  Current Outpatient Medications  Medication Sig Dispense Refill   acetaminophen  (TYLENOL ) 325 MG tablet Take 650 mg by mouth daily as needed (pain).     aspirin  EC 81 MG tablet Take 1 tablet (81 mg total) by mouth daily. Swallow whole.     atorvastatin  (LIPITOR) 20 MG tablet Take 1 tablet (20 mg total) by mouth every morning. 90 tablet 3   diazepam  (VALIUM ) 5 MG tablet Take 1 tablet (5 mg total) by mouth  at bedtime as needed for anxiety (moods,and muscle spasm). 30 tablet 0   ferrous sulfate  325 (65 FE) MG tablet Take 325 mg by mouth daily.     insulin  glargine (LANTUS  SOLOSTAR) 100 UNIT/ML Solostar Pen Inject 15 Units into the skin daily before breakfast.     KRILL OIL PO Take 1 capsule by mouth daily.     losartan  (COZAAR ) 100 MG tablet Take 1 tablet (100 mg total) by mouth daily. 90 tablet 3   metFORMIN  (GLUCOPHAGE ) 500 MG tablet Take 1,000 mg by mouth 2 (two) times a day.     metoprolol  tartrate (LOPRESSOR ) 50 MG tablet Take 1 tablet (50 mg total) by mouth once. Take 1 tablet by mouth 2 hours prior to CT scan. (Patient not taking: Reported on 06/10/2024) 1 tablet 0   Multiple Vitamin (MULTIVITAMIN ADULT PO) Take 1 tablet by mouth daily.     tamsulosin  (FLOMAX ) 0.4 MG CAPS capsule Take 0.4 mg by mouth daily as needed (infrequent urination).     Testosterone 20.25 MG/1.25GM (1.62%) GEL Place 2 Pump onto the skin daily.     No current facility-administered medications for this visit.    (Not in a hospital admission)   Family History  Problem Relation Age of Onset   Lupus Mother      Review of Systems:   Review of Systems  Constitutional:  Positive for malaise/fatigue. Negative for weight loss.  Respiratory:  Positive for shortness of breath.   Cardiovascular:  Negative for chest pain, palpitations and leg swelling.  Gastrointestinal:  Negative for nausea and vomiting.  Neurological:  Negative for dizziness and headaches.      Physical Exam: BP (!) 160/80   Pulse 99   Resp 18   Ht 5' 9 (1.753 m)   Wt 180 lb (81.6 kg)   SpO2 99% Comment: RA  BMI 26.58 kg/m  Physical Exam Constitutional:      Appearance: Normal appearance.  HENT:     Head: Normocephalic and atraumatic.  Cardiovascular:     Rate and Rhythm: Normal rate and regular rhythm.     Heart sounds: Murmur heard.  Pulmonary:     Effort: Pulmonary effort is normal.  Abdominal:     General: There is distension.      Palpations: Abdomen is soft.     Tenderness: There is no abdominal tenderness.  Musculoskeletal:        General: No swelling.  Skin:    General: Skin is warm and dry.  Neurological:     Mental Status: He is alert.      Cardiac Studies & Procedures   ______________________________________________________________________________________________ CARDIAC CATHETERIZATION  CARDIAC CATHETERIZATION 04/23/2024  Conclusion   2nd Diag lesion is 50% stenosed.   Mid LAD to Dist LAD lesion is 60% stenosed.   Mid LAD lesion is 15% stenosed.   LV end diastolic pressure is normal.   There is severe aortic valve stenosis.   In the absence of any other complications or medical issues, we expect the patient  to be ready for discharge from a cath perspective on 04/23/2024.   Recommend Aspirin  81mg  daily for moderate CAD.  Conclusions: Moderate single-vessel coronary artery disease with 60% mid/distal LAD stenosis and 50% ostial D2 lesion.  No angiographically significant disease noted in the LMCA, LCx, or RCA. Upper normal left heart filling pressures (LVEDP 15, PCWP 14 mmHg). Upper normal to mildly elevated right heart and pulmonary artery pressures (mean RA 7, RV 40/7, PA 35/15, mean PA 22 mmHg). Normal Fick cardiac output/index (CO 6.2 L/min, CI 3.1 L/min/m). Severe aortic valve stenosis (mean gradient 78 mmHg, AVA 0,7 cm^2; cannot exclude a component of intracavitary/subaortic obstruction contributing to severely elevated LVOT gradient.  Recommendations: Medical therapy and risk factor modification to prevent progression of nonobstructive CAD. Consider structural heart team consultation for further evaluation and management of aortic stenosis.  Lonni Hanson, MD [facility]  Findings Coronary Findings Diagnostic  Dominance: Right  Left Main Vessel is large. Vessel is angiographically normal.  Left Anterior Descending Vessel is large. Mid LAD lesion is 15% stenosed. The lesion  is eccentric. Mid LAD to Dist LAD lesion is 60% stenosed. The lesion is focal and concentric.  First Diagonal Branch Vessel is small in size.  Second Diagonal Branch Vessel is moderate in size. 2nd Diag lesion is 50% stenosed.  Third Diagonal Branch Vessel is small in size.  Left Circumflex Vessel is large. Vessel is angiographically normal.  First Obtuse Marginal Branch Vessel is moderate in size.  Second Obtuse Marginal Branch Vessel is large in size.  Third Obtuse Marginal Branch Vessel is small in size.  Right Coronary Artery Vessel is large. Vessel is angiographically normal.  Right Posterior Descending Artery Vessel is moderate in size.  Right Posterior Atrioventricular Artery Vessel is moderate in size.  First Right Posterolateral Branch Vessel is small in size.  Second Right Posterolateral Branch Vessel is small in size.  Intervention  No interventions have been documented.     ECHOCARDIOGRAM  ECHOCARDIOGRAM COMPLETE 02/18/2024  Narrative ECHOCARDIOGRAM REPORT    Patient Name:   CLINTEN HOWK Date of Exam: 02/18/2024 Medical Rec #:  [id number]      Height:       70.0 in Accession #:    [id number]     Weight:       183.0 lb Date of Birth:  13-Nov-1940     BSA:          2.010 m Patient Age:    84 years       BP:           136/76 mmHg Patient Gender: M              HR:           75 bpm. Exam Location:  [place]  Procedure: 2D Echo, 3D Echo, Cardiac Doppler, Color Doppler and Strain Analysis (Both Spectral and Color Flow Doppler were utilized during procedure).  Indications:    I35.0 Nonrheumatic aortic (valve) stenosis  History:        Patient has prior history of Echocardiogram examinations, most recent 02/05/2023. Aortic Valve Disease; Risk Factors:Hypertension, Diabetes, Dyslipidemia and Non-Smoker.  Sonographer:    Alan Greenhouse RDMS, RVT, RDCS Referring Phys: [id number] RAJAN R [facility]  IMPRESSIONS   1. Severe aortic valve stenosis.  Aortic valve area, by VTI measures 0.94 , mean gradient measures 50.3 mmHg, Vmax measures 4.97 m/s. Mild aortic insufficiency. 2. Dynamic LV mid cavity gradient, peak 41mm Hg. Left ventricular ejection fraction, by estimation, is  70 to 75%. The left ventricle has hyperdynamic function. 3. The left ventricle has no regional wall motion abnormalities. 4. Left ventricular diastolic parameters are consistent with Grade I diastolic dysfunction (impaired relaxation). The average left ventricular global longitudinal strain is -20.6 %. The global longitudinal strain is normal. 5. Right ventricular systolic function is normal. The right ventricular size is normal. 6. The mitral valve is degenerative. Trivial mitral valve regurgitation. No evidence of mitral stenosis. The mean mitral valve gradient is 2.7 mmHg with average heart rate of 66 bpm. 7. The inferior vena cava is normal in size with greater than 50% respiratory variability, suggesting right atrial pressure of 3 mmHg.  Comparison(s): Echocardiogram done 02/05/23 showed an EF of 60-65% with moderate AS and an AV Mean Grad of 39 mmHg.  Conclusion(s)/Recommendation(s): Further evaluation for severe Aortic stenosis as per Cardiology team.  FINDINGS Left Ventricle: Dynamic LV mid cavity gradient, peak 41mm Hg. Left ventricular ejection fraction, by estimation, is 70 to 75%. The left ventricle has hyperdynamic function. The left ventricle has no regional wall motion abnormalities. The average left ventricular global longitudinal strain is -20.6 %. Strain was performed and the global longitudinal strain is normal. The left ventricular internal cavity size was normal in size. There is no left ventricular hypertrophy. Left ventricular diastolic parameters are consistent with Grade I diastolic dysfunction (impaired relaxation).  Right Ventricle: The right ventricular size is normal. Right vetricular wall thickness was not well visualized. Right ventricular  systolic function is normal.  Left Atrium: Left atrial size was normal in size.  Right Atrium: Right atrial size was normal in size.  Pericardium: There is no evidence of pericardial effusion.  Mitral Valve: The mitral valve is degenerative in appearance. Trivial mitral valve regurgitation. No evidence of mitral valve stenosis. The mean mitral valve gradient is 2.7 mmHg with average heart rate of 66 bpm.  Tricuspid Valve: The tricuspid valve is grossly normal. Tricuspid valve regurgitation is trivial. No evidence of tricuspid stenosis.  Aortic Valve: The aortic valve is calcified. Aortic valve regurgitation is mild. Severe aortic stenosis is present. Aortic valve mean gradient measures 50.3 mmHg. Aortic valve peak gradient measures 98.8 mmHg. Aortic valve area, by VTI measures 0.94 cm.  Pulmonic Valve: The pulmonic valve was grossly normal. Pulmonic valve regurgitation is not visualized. No evidence of pulmonic stenosis.  Aorta: The aortic root and ascending aorta are structurally normal, with no evidence of dilitation.  Venous: The inferior vena cava is normal in size with greater than 50% respiratory variability, suggesting right atrial pressure of 3 mmHg.  IAS/Shunts: The interatrial septum was not well visualized.   LEFT VENTRICLE PLAX 2D LVIDd:         3.00 cm     Diastology LVIDs:         1.80 cm     LV e' medial:    3.12 cm/s LV PW:         1.40 cm     LV E/e' medial:  27.4 LV IVS:        1.40 cm     LV e' lateral:   5.75 cm/s LVOT diam:     1.80 cm     LV E/e' lateral: 14.9 LV SV:         95 LV SV Index:   47          2D Longitudinal Strain LVOT Area:     2.54 cm    2D Strain GLS (A4C):   -21.1 %  LV IVRT:       111 msec    2D Strain GLS (A3C):   -20.2 % 2D Strain GLS (A2C):   -20.6 % 2D Strain GLS Avg:     -20.6 % LV Volumes (MOD) LV vol d, MOD A2C: 76.1 ml LV vol d, MOD A4C: 68.9 ml LV vol s, MOD A2C: 16.5 ml 3D Volume EF: LV vol s, MOD A4C: 12.7 ml 3D EF:        58  % LV SV MOD A2C:     59.6 ml LV EDV:       145 ml LV SV MOD A4C:     68.9 ml LV ESV:       61 ml LV SV MOD BP:      62.9 ml LV SV:        84 ml  RIGHT VENTRICLE RV S prime:     9.03 cm/s TAPSE (M-mode): 1.8 cm  LEFT ATRIUM             Index        RIGHT ATRIUM          Index LA diam:        3.60 cm 1.79 cm/m   RA Area:     8.17 cm LA Vol (A2C):   39.3 ml 19.55 ml/m  RA Volume:   12.00 ml 5.97 ml/m LA Vol (A4C):   35.3 ml 17.56 ml/m LA Biplane Vol: 40.6 ml 20.20 ml/m AORTIC VALVE AV Area (Vmax):    0.94 cm AV Area (Vmean):   0.93 cm AV Area (VTI):     0.94 cm AV Vmax:           497.00 cm/s AV Vmean:          327.000 cm/s AV VTI:            1.008 m AV Peak Grad:      98.8 mmHg AV Mean Grad:      50.3 mmHg LVOT Vmax:         184.00 cm/s LVOT Vmean:        120.000 cm/s LVOT VTI:          0.372 m LVOT/AV VTI ratio: 0.37 AR Vena Contracta: 0.30 cm  AORTA Ao Root diam: 3.70 cm Ao Asc diam:  3.50 cm  MITRAL VALVE                TRICUSPID VALVE MV Area (PHT): 2.46 cm     TR Peak grad:   13.7 mmHg MV Mean grad:  2.7 mmHg     TR Vmax:        185.00 cm/s MV Decel Time: 309 msec MR Peak grad: 124.1 mmHg    SHUNTS MR Vmax:      557.00 cm/s   Systemic VTI:  0.37 m MV E velocity: 85.40 cm/s   Systemic Diam: 1.80 cm MV A velocity: 138.00 cm/s MV E/A ratio:  0.62  Sreedhar reddy Madireddy Electronically signed by Alean reddy Madireddy Signature Date/Time: 02/18/2024/11:27:52 PM    Final      CT SCANS  CT CORONARY MORPH W/CTA COR W/SCORE 05/26/2024  Addendum 06/06/2024 10:10 PM ADDENDUM REPORT: 06/06/2024 22:07  EXAM: OVER-READ INTERPRETATION  CT CHEST  The following report is an over-read performed by radiologist Dr. Franky Leff [facility] on 06/06/2024. This over-read does not include interpretation of cardiac or coronary anatomy or pathology. The coronary CTA interpretation by the cardiologist is attached.  COMPARISON:  Comparison  01/24/2023.  FINDINGS: Heart is normal size. Aorta normal caliber. Extensive aortic valve calcifications. Calcifications in the ascending thoracic aorta. No confluent airspace opacities or effusions. No acute findings in the upper abdomen. Chest wall soft tissues are unremarkable. No acute bony abnormality.  IMPRESSION: No acute extra cardiac abnormality.  Aortic atherosclerosis.   Electronically Signed By: Franky Crease M.D. On: 06/06/2024 22:07  Narrative CLINICAL DATA:  Severe Aortic Stenosis.  EXAM: Cardiac TAVR CT  TECHNIQUE: A non-contrast, gated CT scan was obtained with axial slices of 2.5 mm through the heart for aortic valve scoring. A 100 kV retrospective, gated, contrast cardiac scan was obtained. Gantry rotation speed was 230 msec and collimation was 0.63 mm. Nitroglycerin  was not given. The 3D dataset was reconstructed in systole with motion correction. The 3D data set was reconstructed in 5% intervals of the 0-95% of the R-R cycle. Systolic and diastolic phases were analyzed on a dedicated workstation using MPR, MIP, and VRT modes. The patient received 100 cc of contrast.  FINDINGS: Image quality: Excellent.  Noise artifact is: Limited.  Valve Morphology: Tricuspid aortic valve with severe diffuse calcifications. Severely restricted leaflet motion in systole. Bulky calcification of the NCC/RCC.  Aortic Valve Calcium  score: 5254  Aortic annular dimension:  Phase assessed: 25%  Annular area: 592 mm2  Annular perimeter: 88.2 mm  Max diameter: 32.3 mm  Min diameter: 24.4 mm  Annular and subannular calcification: Mild, single protruding calcification under the NCC extending into the LVOT. Mild, single, layered calcification under the LCC.  Membranous septum length: 12.3 mm  Optimal coplanar projection: LAO 9 CRA 3  Coronary Artery Height above Annulus:  Left Main: 18.1 mm  Right Coronary: 20.1 mm  Sinus of Valsalva  Measurements:  Non-coronary: 38 mm  Right-coronary: 37 mm  Left-coronary: 38 mm  Sinus of Valsalva Height:  Non-coronary: 26.8 mm  Right-coronary: 23.9 mm  Left-coronary: 24.5 mm  Sinotubular Junction: 30 mm  Ascending Thoracic Aorta: 36 mm  Coronary Arteries: Normal coronary origin. Right dominance. The study was performed without use of NTG and is insufficient for plaque evaluation. Please refer to recent cardiac catheterization for coronary assessment.  Cardiac Morphology:  Right Atrium: Right atrial size is within normal limits.  Right Ventricle: The right ventricular cavity is within normal limits.  Left Atrium: Left atrial size is normal in size with no left atrial appendage filling defect.  Left Ventricle: The ventricular cavity size is within normal limits.  Pulmonary arteries: Normal in size without proximal filling defect.  Pulmonary veins: Normal pulmonary venous drainage.  Pericardium: Normal thickness with no significant effusion or calcium  present.  Mitral Valve: The mitral valve is normal structure without significant calcification.  Extra-cardiac findings: See attached radiology report for non-cardiac structures.  IMPRESSION: 1. Annular measurements support a 29 mm S3 or 34 mm Evolut Pro.  2. Mild, single protruding calcification under the NCC extending into the LVOT. Mild, single, layered calcification under the LCC.  3. Sufficient coronary to annulus distance.  4. Optimal Fluoroscopic Angle for Delivery: LAO 9 CRA 3  Rushmore T. Barbaraann, MD  Electronically Signed: By: Darryle Barbaraann M.D. On: 05/26/2024 18:57   CT SCANS  CT CORONARY MORPH W/CTA COR W/SCORE 01/24/2023  Addendum 02/04/2023 11:52 AM ADDENDUM REPORT: 02/04/2023 11:50  EXAM: OVER-READ INTERPRETATION  CT CHEST  The following report is an over-read performed by radiologist Dr. Andrea Gasman of [facility] on 02/04/2023. This over-read does not include  interpretation of cardiac or coronary  anatomy or pathology. The coronary CTA interpretation by the cardiologist is attached.  COMPARISON:  None.  FINDINGS: Vascular: Aortic atherosclerosis. The included aorta is normal in caliber.  Mediastinum/nodes: No adenopathy or mass. Unremarkable esophagus.  Lungs: Mild elevation of right hemidiaphragm with compressive atelectasis at the right lung base. No pulmonary nodule. No pleural fluid. The included airways are patent.  Upper abdomen: No acute or unexpected findings.  Musculoskeletal: There are no acute or suspicious osseous abnormalities.  IMPRESSION: 1. Mild elevation of right hemidiaphragm with compressive atelectasis at the right lung base. 2. Aortic Atherosclerosis (ICD10-I70.0).   Electronically Signed By: Andrea Gasman M.D. On: 02/04/2023 11:50  Narrative CLINICAL DATA:  84 Year-old Male  EXAM: Cardiac/Coronary  CTA  TECHNIQUE: The patient was scanned on a [employer].  FINDINGS: Scan was triggered in the descending thoracic aorta. Axial non-contrast 3 mm slices were carried out through the heart. The data set was analyzed on a dedicated work station and scored using the Agatson method. Gantry rotation speed was 250 msecs and collimation was .6 mm. 0.8 mg of sl NTG was given. The 3D data set was reconstructed in 5% intervals of the 67-82 % of the R-R cycle. Diastolic phases were analyzed on a dedicated work station using MPR, MIP and VRT modes. The patient received 95 cc of contrast.  Coronary Arteries:  Normal coronary origin.  Right dominance.  Coronary Calcium  Score:  Left main: 137  Left anterior descending artery: 517  Left circumflex artery: 37  Right coronary artery: 70  Total: 761  Percentile: 62nd for age, sex, and race matched control.  Plaque Analysis:  Left main: 39 Cubic mm calcified plaque, 121 cubic mm non calcified plaque, 2 cubic mm low attenuation plaque, 160 cubic  mm total plaque burden.  Left anterior descending artery: 108 Cubic mm calcified plaque, 399 cubic mm non calcified plaque, 8 cubic mm low attenuation plaque, 507 cubic mm total plaque burden.  Left circumflex artery: 7 Cubic mm calcified plaque, 134 cubic mm non calcified plaque, 1 cubic mm low attenuation plaque, 141 cubic mm total plaque burden.  Right coronary artery: 13 Cubic mm calcified plaque, 119 cubic mm non calcified plaque, 0 cubic mm low attenuation plaque, 132 cubic mm total plaque burden.  Total: 940  RCA is a large dominant artery that gives rise to PDA and PLA. Minimal non-obstructive calcified plaque (1-24%) in the proximal RCA. Minimal non-obstructive mixed plaques (1-24%) in the distal RCA.  Left main is a large artery that gives rise to LAD and LCX arteries. Minimal non-obstructive calcified plaque (1-24%) in the proximal left main.  LAD is a large vessel that gives rise to multiple diagonal vessels. Mild non-obstructive mixed plaques (25-49%) in proximal LAD. Mild non-obstructive mixed plaques (25-49%) in mid LAD. Mild non-obstructive calcified plaque (25-49%) in distal LAD.  LCX is a non-dominant artery that gives rise to two OM branches. Minimal non-obstructive calcified plaque (1-24%) in the proximal LCX.  Other findings:  Aorta: Normal size.  Aortic atherosclerosis.  No dissection.  Main Pulmonary Artery: Normal size of the pulmonary artery.  Systemic Veins: Normal drainage  Aortic Valve: Tr-leaflet. AV Calcium  score 4989. This can be seen in severe aortic stenosis. Study not optimized for aortic valve assessment.  Mitral valve: Mild Calcification. Calcification of the intervalvular fibrosa. 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  Normal variant pulmonary vein drainage into the left atrium- right middle pulmonary vein.  Normal left atrial appendage without a thrombus.  Interatrial  septum with no clear communications  Left  Ventricle: Normal size. Severe basal septal hypertrophy (17 mm). No systolic anterior motion of the mitral valve.  Left Atrium: Normal size  Right Ventricle: Dilated  Right Atrium: Normal size  Pericardium: Normal thickness  Extra-cardiac findings: See attached radiology report for non-cardiac structures.  IMPRESSION: 1. Coronary calcium  score of 761. This was 62nd percentile for age, sex, and race matched control. Total plaque volume 940, over 50th percentile for age and gender.  2. Normal coronary origin with right dominance.  3. CAD-RADS 2. Mild non-obstructive CAD (25-49%). Consider non-atherosclerotic causes of chest pain. Consider preventive therapy and risk factor modification.  4. Aortic valve calcium  score of 4989. Tri-leaflet valve that is somewhat restricted. Consider echocardiogram.  RECOMMENDATIONS: RECOMMENDATIONS The proposed cut-off value of 1,651 AU yielded a 93 % sensitivity and 75 % specificity in grading AS severity in patients with classical low-flow, low-gradient AS. Proposed different cut-off values to define severe AS for men and women as 2,065 AU and 1,274 AU, respectively. The joint European and American recommendations for the assessment of AS consider the aortic valve calcium  score as a continuum - a very high calcium  score suggests severe AS and a low calcium  score suggests severe AS is unlikely.  Donney VEAR Jarome LULLA Essa RENETTE, et al. 2017 ESC/EACTS Guidelines for the management of valvular heart disease. Eur Heart J 2017;38:2739-91.  Coronary artery calcium  (CAC) score is a strong predictor of incident coronary heart disease (CHD) and provides predictive information beyond traditional risk factors. CAC scoring is reasonable to use in the decision to withhold, postpone, or initiate statin therapy in intermediate-risk or selected borderline-risk asymptomatic adults (age 50-75 years and LDL-C >=70 to <190 mg/dL) who do not have diabetes or  established atherosclerotic cardiovascular disease (ASCVD).* In intermediate-risk (10-year ASCVD risk >=7.5% to <20%) adults or selected borderline-risk (10-year ASCVD risk >=5% to <7.5%) adults in whom a CAC score is measured for the purpose of making a treatment decision the following recommendations have been made:  If CAC = 0, it is reasonable to withhold statin therapy and reassess in 5 to 10 years, as long as higher risk conditions are absent (diabetes mellitus, family history of premature CHD in first degree relatives (males <55 years; females <65 years), cigarette smoking, LDL >=190 mg/dL or other independent risk factors).  If CAC is 1 to 99, it is reasonable to initiate statin therapy for patients >=35 years of age.  If CAC is >=100 or >=75th percentile, it is reasonable to initiate statin therapy at any age.  Cardiology referral should be considered for patients with CAC scores =400 or >=75th percentile.  *2018 AHA/ACC/AACVPR/AAPA/ABC/ACPM/ADA/AGS/APhA/ASPC/NLA/PCNA Guideline on the Management of Blood Cholesterol: A Report of the American College of Cardiology/American Heart Association Task Force on Clinical Practice Guidelines. J Am Coll Cardiol. 2019;73(24):3168-3209.  Stanly Leavens, MD  Electronically Signed: By: Stanly Leavens M.D. On: 01/24/2023 17:02     ______________________________________________________________________________________________      ECG NSR, RBBB    I have independently reviewed the above radiologic studies and discussed with the patient   Recent Lab Findings: Lab Results  Component Value Date   WBC 3.8 04/20/2024   HGB 11.2 (L) 04/23/2024   HCT 33.0 (L) 04/23/2024   PLT 209 04/20/2024   GLUCOSE 168 (H) 04/20/2024   CHOL 116 02/07/2023   TRIG 123 02/07/2023   HDL 43 02/07/2023   LDLCALC 51 02/07/2023   ALT 25 02/07/2023   AST 23 02/07/2023  NA 140 04/23/2024   K 3.6 04/23/2024   CL 101 04/20/2024    CREATININE 0.82 04/20/2024   BUN 17 04/20/2024   CO2 24 04/20/2024   INR 1.1 03/18/2023   HGBA1C 5.9 (A) 04/09/2024      Assessment / Plan:   84 y.o. male with severe aortic stenosis.   Will plan to put a Foley in before the TAVR per his request, given history of difficulties using a urinal while on bedrest.  We discussed his higher-than-average risk of need for permanent pacemaker given pre-existing right bundle branch block.  He and his wife had several questions and concerns about this but have a good understanding of the situation at this point.  STS score: 2.07%.  NYHA Class II.  The risks and benefits of transfemoral TAVR were discussed in detail.  The risks included death, stroke, paravalvular leak, aortic dissection, annulus rupture, device embolization, acute myocardial infarction, arrhythmia, heart block or need for permanent pacemaker.  We also discussed possibility of an emergent sternotomy to address any procedural complications.  Based on our discussion, we collectively decided that an emergent sternotomy would be indicated.  The patient is agreeable to proceed.  Based on my review of his LHC, echo, and CTA, I agree with the multidisciplinary plan to proceed with a 29 mm S3 transfemoral TAVR.  Bailout candidate.  I  spent 30 minutes counseling the patient face to face.   Con RAMAN Jahmir Salo 06/11/2024 10:32 AM      [1]  Social History Tobacco Use  Smoking Status Never  Smokeless Tobacco Never  [2]  Allergies Allergen Reactions   Lisinopril Cough   "

## 2024-06-12 ENCOUNTER — Encounter (HOSPITAL_COMMUNITY)
Admission: RE | Admit: 2024-06-12 | Discharge: 2024-06-12 | Disposition: A | Source: Ambulatory Visit | Attending: Cardiovascular Disease

## 2024-06-12 ENCOUNTER — Other Ambulatory Visit: Payer: Self-pay

## 2024-06-12 ENCOUNTER — Ambulatory Visit (HOSPITAL_COMMUNITY)
Admission: RE | Admit: 2024-06-12 | Discharge: 2024-06-12 | Disposition: A | Source: Ambulatory Visit | Attending: Cardiovascular Disease | Admitting: Cardiovascular Disease

## 2024-06-12 DIAGNOSIS — I350 Nonrheumatic aortic (valve) stenosis: Secondary | ICD-10-CM | POA: Insufficient documentation

## 2024-06-12 DIAGNOSIS — Z01818 Encounter for other preprocedural examination: Secondary | ICD-10-CM | POA: Insufficient documentation

## 2024-06-12 DIAGNOSIS — I4510 Unspecified right bundle-branch block: Secondary | ICD-10-CM | POA: Insufficient documentation

## 2024-06-12 LAB — URINALYSIS, ROUTINE W REFLEX MICROSCOPIC
Bacteria, UA: NONE SEEN
Bilirubin Urine: NEGATIVE
Glucose, UA: NEGATIVE mg/dL
Hgb urine dipstick: NEGATIVE
Ketones, ur: NEGATIVE mg/dL
Leukocytes,Ua: NEGATIVE
Nitrite: NEGATIVE
Protein, ur: 30 mg/dL — AB
Specific Gravity, Urine: 1.02 (ref 1.005–1.030)
pH: 7 (ref 5.0–8.0)

## 2024-06-12 LAB — CBC
HCT: 36.5 % — ABNORMAL LOW (ref 39.0–52.0)
Hemoglobin: 12.1 g/dL — ABNORMAL LOW (ref 13.0–17.0)
MCH: 31.7 pg (ref 26.0–34.0)
MCHC: 33.2 g/dL (ref 30.0–36.0)
MCV: 95.5 fL (ref 80.0–100.0)
Platelets: 180 K/uL (ref 150–400)
RBC: 3.82 MIL/uL — ABNORMAL LOW (ref 4.22–5.81)
RDW: 12.4 % (ref 11.5–15.5)
WBC: 4.5 K/uL (ref 4.0–10.5)
nRBC: 0 % (ref 0.0–0.2)

## 2024-06-12 LAB — PROTIME-INR
INR: 1.1 (ref 0.8–1.2)
Prothrombin Time: 14.4 s (ref 11.4–15.2)

## 2024-06-12 LAB — COMPREHENSIVE METABOLIC PANEL WITH GFR
ALT: 31 U/L (ref 0–44)
AST: 34 U/L (ref 15–41)
Albumin: 4.6 g/dL (ref 3.5–5.0)
Alkaline Phosphatase: 101 U/L (ref 38–126)
Anion gap: 11 (ref 5–15)
BUN: 14 mg/dL (ref 8–23)
CO2: 26 mmol/L (ref 22–32)
Calcium: 9.8 mg/dL (ref 8.9–10.3)
Chloride: 103 mmol/L (ref 98–111)
Creatinine, Ser: 0.87 mg/dL (ref 0.61–1.24)
GFR, Estimated: >60 mL/min
Glucose, Bld: 104 mg/dL — ABNORMAL HIGH (ref 70–99)
Potassium: 4.3 mmol/L (ref 3.5–5.1)
Sodium: 140 mmol/L (ref 135–145)
Total Bilirubin: 0.6 mg/dL (ref 0.0–1.2)
Total Protein: 7.6 g/dL (ref 6.5–8.1)

## 2024-06-12 LAB — TYPE AND SCREEN
ABO/RH(D): A POS
Antibody Screen: NEGATIVE

## 2024-06-12 LAB — SURGICAL PCR SCREEN
MRSA, PCR: NEGATIVE
Staphylococcus aureus: POSITIVE — AB

## 2024-06-12 NOTE — Progress Notes (Signed)
 All consents signed by patient at PAT lab appointment. Pt was sent home with printed copy of surgical instructions and CHG soap/CHG soap instructions. All instructions reviewed with patient and questions answered.  Patients chart send to anesthesia for review. Pt denies any respiratory illness/infection in the last two months.

## 2024-06-16 MED ORDER — CEFAZOLIN SODIUM-DEXTROSE 2-4 GM/100ML-% IV SOLN
2.0000 g | INTRAVENOUS | Status: AC
  Administered 2024-06-17: 2 g via INTRAVENOUS
  Filled 2024-06-16: qty 100

## 2024-06-16 MED ORDER — HEPARIN 30,000 UNITS/1000 ML (OHS) CELLSAVER SOLUTION
Status: DC
  Filled 2024-06-16: qty 1000

## 2024-06-16 MED ORDER — MAGNESIUM SULFATE 50 % IJ SOLN
40.0000 meq | INTRAMUSCULAR | Status: DC
  Filled 2024-06-16: qty 9.85

## 2024-06-16 MED ORDER — NOREPINEPHRINE 4 MG/250ML-% IV SOLN
0.0000 ug/min | INTRAVENOUS | Status: AC
  Administered 2024-06-17: 2 ug/min via INTRAVENOUS
  Filled 2024-06-16: qty 250

## 2024-06-16 MED ORDER — DEXMEDETOMIDINE HCL IN NACL 400 MCG/100ML IV SOLN
0.1000 ug/kg/h | INTRAVENOUS | Status: AC
  Administered 2024-06-17: 1 ug/kg/h via INTRAVENOUS
  Administered 2024-06-17: 81.6 ug via INTRAVENOUS
  Filled 2024-06-16: qty 100

## 2024-06-16 MED ORDER — POTASSIUM CHLORIDE 2 MEQ/ML IV SOLN
80.0000 meq | INTRAVENOUS | Status: DC
  Filled 2024-06-16: qty 40

## 2024-06-17 ENCOUNTER — Inpatient Hospital Stay (HOSPITAL_COMMUNITY)

## 2024-06-17 ENCOUNTER — Other Ambulatory Visit: Payer: Self-pay

## 2024-06-17 ENCOUNTER — Encounter (HOSPITAL_COMMUNITY): Payer: Self-pay | Admitting: Cardiovascular Disease

## 2024-06-17 ENCOUNTER — Encounter (HOSPITAL_COMMUNITY): Payer: Self-pay | Admitting: Vascular Surgery

## 2024-06-17 ENCOUNTER — Inpatient Hospital Stay (HOSPITAL_COMMUNITY)
Admission: RE | Admit: 2024-06-17 | Discharge: 2024-06-19 | DRG: 266 | Disposition: A | Discharge status: Home / Self Care | Attending: Cardiovascular Disease | Admitting: Cardiovascular Disease

## 2024-06-17 ENCOUNTER — Inpatient Hospital Stay (HOSPITAL_COMMUNITY): Payer: Self-pay | Admitting: Vascular Surgery

## 2024-06-17 ENCOUNTER — Encounter (HOSPITAL_COMMUNITY)
Admission: RE | Disposition: A | Discharge status: Home / Self Care | Payer: Self-pay | Source: Home / Self Care | Attending: Cardiovascular Disease

## 2024-06-17 DIAGNOSIS — G8929 Other chronic pain: Secondary | ICD-10-CM | POA: Diagnosis present

## 2024-06-17 DIAGNOSIS — M48061 Spinal stenosis, lumbar region without neurogenic claudication: Secondary | ICD-10-CM | POA: Diagnosis present

## 2024-06-17 DIAGNOSIS — I2510 Atherosclerotic heart disease of native coronary artery without angina pectoris: Secondary | ICD-10-CM | POA: Diagnosis not present

## 2024-06-17 DIAGNOSIS — E1169 Type 2 diabetes mellitus with other specified complication: Secondary | ICD-10-CM | POA: Diagnosis present

## 2024-06-17 DIAGNOSIS — R110 Nausea: Secondary | ICD-10-CM | POA: Diagnosis not present

## 2024-06-17 DIAGNOSIS — Z952 Presence of prosthetic heart valve: Principal | ICD-10-CM

## 2024-06-17 DIAGNOSIS — F32A Depression, unspecified: Secondary | ICD-10-CM | POA: Diagnosis present

## 2024-06-17 DIAGNOSIS — I350 Nonrheumatic aortic (valve) stenosis: Secondary | ICD-10-CM

## 2024-06-17 DIAGNOSIS — E1140 Type 2 diabetes mellitus with diabetic neuropathy, unspecified: Secondary | ICD-10-CM | POA: Diagnosis present

## 2024-06-17 DIAGNOSIS — F418 Other specified anxiety disorders: Secondary | ICD-10-CM | POA: Diagnosis not present

## 2024-06-17 DIAGNOSIS — Z981 Arthrodesis status: Secondary | ICD-10-CM

## 2024-06-17 DIAGNOSIS — Z7982 Long term (current) use of aspirin: Secondary | ICD-10-CM

## 2024-06-17 DIAGNOSIS — I110 Hypertensive heart disease with heart failure: Secondary | ICD-10-CM | POA: Diagnosis present

## 2024-06-17 DIAGNOSIS — Z006 Encounter for examination for normal comparison and control in clinical research program: Secondary | ICD-10-CM | POA: Diagnosis not present

## 2024-06-17 DIAGNOSIS — Z888 Allergy status to other drugs, medicaments and biological substances status: Secondary | ICD-10-CM

## 2024-06-17 DIAGNOSIS — I493 Ventricular premature depolarization: Secondary | ICD-10-CM | POA: Diagnosis present

## 2024-06-17 DIAGNOSIS — M1990 Unspecified osteoarthritis, unspecified site: Secondary | ICD-10-CM | POA: Diagnosis present

## 2024-06-17 DIAGNOSIS — E7849 Other hyperlipidemia: Secondary | ICD-10-CM | POA: Diagnosis present

## 2024-06-17 DIAGNOSIS — I4510 Unspecified right bundle-branch block: Secondary | ICD-10-CM | POA: Diagnosis present

## 2024-06-17 DIAGNOSIS — Z8269 Family history of other diseases of the musculoskeletal system and connective tissue: Secondary | ICD-10-CM

## 2024-06-17 DIAGNOSIS — D509 Iron deficiency anemia, unspecified: Secondary | ICD-10-CM | POA: Diagnosis present

## 2024-06-17 DIAGNOSIS — Z8546 Personal history of malignant neoplasm of prostate: Secondary | ICD-10-CM

## 2024-06-17 DIAGNOSIS — Z794 Long term (current) use of insulin: Secondary | ICD-10-CM

## 2024-06-17 DIAGNOSIS — M549 Dorsalgia, unspecified: Secondary | ICD-10-CM | POA: Diagnosis present

## 2024-06-17 DIAGNOSIS — Z79899 Other long term (current) drug therapy: Secondary | ICD-10-CM

## 2024-06-17 DIAGNOSIS — Z7984 Long term (current) use of oral hypoglycemic drugs: Secondary | ICD-10-CM

## 2024-06-17 DIAGNOSIS — I442 Atrioventricular block, complete: Secondary | ICD-10-CM

## 2024-06-17 DIAGNOSIS — I10 Essential (primary) hypertension: Secondary | ICD-10-CM | POA: Diagnosis present

## 2024-06-17 DIAGNOSIS — I959 Hypotension, unspecified: Secondary | ICD-10-CM | POA: Diagnosis not present

## 2024-06-17 DIAGNOSIS — F419 Anxiety disorder, unspecified: Secondary | ICD-10-CM | POA: Diagnosis present

## 2024-06-17 DIAGNOSIS — Z9049 Acquired absence of other specified parts of digestive tract: Secondary | ICD-10-CM

## 2024-06-17 DIAGNOSIS — Z950 Presence of cardiac pacemaker: Secondary | ICD-10-CM

## 2024-06-17 DIAGNOSIS — I5031 Acute diastolic (congestive) heart failure: Secondary | ICD-10-CM | POA: Diagnosis present

## 2024-06-17 DIAGNOSIS — C61 Malignant neoplasm of prostate: Secondary | ICD-10-CM | POA: Diagnosis present

## 2024-06-17 DIAGNOSIS — R339 Retention of urine, unspecified: Secondary | ICD-10-CM | POA: Diagnosis present

## 2024-06-17 DIAGNOSIS — E1142 Type 2 diabetes mellitus with diabetic polyneuropathy: Secondary | ICD-10-CM | POA: Diagnosis present

## 2024-06-17 HISTORY — DX: Unspecified right bundle-branch block: I45.10

## 2024-06-17 HISTORY — DX: Nonrheumatic aortic (valve) stenosis: I35.0

## 2024-06-17 LAB — GLUCOSE, CAPILLARY
Glucose-Capillary: 159 mg/dL — ABNORMAL HIGH (ref 70–99)
Glucose-Capillary: 142 mg/dL — ABNORMAL HIGH (ref 70–99)
Glucose-Capillary: 159 mg/dL — ABNORMAL HIGH (ref 70–99)
Glucose-Capillary: 197 mg/dL — ABNORMAL HIGH (ref 70–99)
Glucose-Capillary: 146 mg/dL — ABNORMAL HIGH (ref 70–99)

## 2024-06-17 LAB — ECHOCARDIOGRAM LIMITED
AR max vel: 2.8 cm2
AV Area VTI: 3.09 cm2
AV Area mean vel: 2.29 cm2
AV Mean grad: 4 mmHg
AV Peak grad: 7.8 mmHg
Ao pk vel: 1.4 m/s
Area-P 1/2: 2.36 cm2
P 1/2 time: 571 ms
S' Lateral: 2.1 cm

## 2024-06-17 LAB — MRSA NEXT GEN BY PCR, NASAL: MRSA by PCR Next Gen: NOT DETECTED

## 2024-06-17 MED ORDER — DIAZEPAM 5 MG PO TABS
5.0000 mg | ORAL_TABLET | Freq: Every evening | ORAL | Status: DC | PRN
  Filled 2024-06-17: qty 1

## 2024-06-17 MED ORDER — ACETAMINOPHEN 325 MG PO TABS
650.0000 mg | ORAL_TABLET | Freq: Four times a day (QID) | ORAL | Status: DC | PRN
  Administered 2024-06-17 – 2024-06-18 (×3): 650 mg via ORAL
  Filled 2024-06-17 (×3): qty 2

## 2024-06-17 MED ORDER — FENTANYL CITRATE (PF) 100 MCG/2ML IJ SOLN
INTRAMUSCULAR | Status: DC | PRN
  Administered 2024-06-17: 25 ug via INTRAVENOUS

## 2024-06-17 MED ORDER — ACETAMINOPHEN 500 MG PO TABS
1000.0000 mg | ORAL_TABLET | Freq: Once | ORAL | Status: AC
  Administered 2024-06-17: 1000 mg via ORAL

## 2024-06-17 MED ORDER — IODIXANOL 320 MG/ML IV SOLN
INTRAVENOUS | Status: DC | PRN
  Administered 2024-06-17: 35 mL via INTRA_ARTERIAL

## 2024-06-17 MED ORDER — SODIUM CHLORIDE 0.9% FLUSH: 3.0000 mL | INTRAVENOUS | Status: DC | PRN

## 2024-06-17 MED ORDER — ORAL CARE MOUTH RINSE: 15.0000 mL | OROMUCOSAL | Status: DC | PRN

## 2024-06-17 MED ORDER — ONDANSETRON HCL 4 MG/2ML IJ SOLN
INTRAMUSCULAR | Status: DC | PRN
  Administered 2024-06-17: 4 mg via INTRAVENOUS

## 2024-06-17 MED ORDER — CHLORHEXIDINE GLUCONATE 0.12 % MT SOLN
15.0000 mL | Freq: Once | OROMUCOSAL | Status: AC
  Administered 2024-06-17: 15 mL via OROMUCOSAL
  Filled 2024-06-17: qty 15

## 2024-06-17 MED ORDER — CHLORHEXIDINE GLUCONATE 4 % EX SOLN
60.0000 mL | Freq: Once | CUTANEOUS | Status: DC
  Filled 2024-06-17: qty 60

## 2024-06-17 MED ORDER — HEPARIN (PORCINE) IN NACL 1000-0.9 UT/500ML-% IV SOLN
INTRAVENOUS | Status: DC | PRN
  Administered 2024-06-17 (×2): 500 mL

## 2024-06-17 MED ORDER — FERROUS SULFATE 325 (65 FE) MG PO TABS
325.0000 mg | ORAL_TABLET | Freq: Every day | ORAL | Status: DC
  Administered 2024-06-17 – 2024-06-19 (×3): 325 mg via ORAL
  Filled 2024-06-17 (×3): qty 1

## 2024-06-17 MED ORDER — SODIUM CHLORIDE 0.9 % IV SOLN: INTRAVENOUS | Status: DC

## 2024-06-17 MED ORDER — FENTANYL CITRATE (PF) 100 MCG/2ML IJ SOLN
INTRAMUSCULAR | Status: AC
  Filled 2024-06-17: qty 2

## 2024-06-17 MED ORDER — OXYCODONE HCL 5 MG PO TABS
5.0000 mg | ORAL_TABLET | ORAL | Status: DC | PRN
  Filled 2024-06-17: qty 1

## 2024-06-17 MED ORDER — PROPOFOL 10 MG/ML IV BOLUS
INTRAVENOUS | Status: DC | PRN
  Administered 2024-06-17 (×2): 20 mg via INTRAVENOUS

## 2024-06-17 MED ORDER — PHENYLEPHRINE 80 MCG/ML (10ML) SYRINGE FOR IV PUSH (FOR BLOOD PRESSURE SUPPORT)
PREFILLED_SYRINGE | INTRAVENOUS | Status: DC | PRN
  Administered 2024-06-17: 160 ug via INTRAVENOUS

## 2024-06-17 MED ORDER — NITROGLYCERIN IN D5W 200-5 MCG/ML-% IV SOLN: 0.0000 ug/min | INTRAVENOUS | Status: DC

## 2024-06-17 MED ORDER — MORPHINE SULFATE (PF) 2 MG/ML IV SOLN: 1.0000 mg | INTRAVENOUS | Status: DC | PRN

## 2024-06-17 MED ORDER — INSULIN ASPART 100 UNIT/ML IJ SOLN
0.0000 [IU] | Freq: Three times a day (TID) | INTRAMUSCULAR | Status: DC
  Administered 2024-06-17 – 2024-06-19 (×7): 2 [IU] via SUBCUTANEOUS
  Filled 2024-06-17 (×7): qty 2

## 2024-06-17 MED ORDER — SODIUM CHLORIDE 0.9% FLUSH
3.0000 mL | Freq: Two times a day (BID) | INTRAVENOUS | Status: DC
  Administered 2024-06-18 – 2024-06-19 (×3): 3 mL via INTRAVENOUS

## 2024-06-17 MED ORDER — SODIUM CHLORIDE 0.9 % IV SOLN: 250.0000 mL | INTRAVENOUS | Status: DC | PRN

## 2024-06-17 MED ORDER — TRAMADOL HCL 50 MG PO TABS: 50.0000 mg | ORAL_TABLET | ORAL | Status: DC | PRN

## 2024-06-17 MED ORDER — SODIUM CHLORIDE 0.9 % IV SOLN: INTRAVENOUS | Status: AC

## 2024-06-17 MED ORDER — PROPOFOL 500 MG/50ML IV EMUL
INTRAVENOUS | Status: DC | PRN
  Administered 2024-06-17: 10 ug/kg/min via INTRAVENOUS

## 2024-06-17 MED ORDER — PROTAMINE SULFATE 10 MG/ML IV SOLN
INTRAVENOUS | Status: DC | PRN
  Administered 2024-06-17: 50 mg via INTRAVENOUS

## 2024-06-17 MED ORDER — HEPARIN (PORCINE) IN NACL 2000-0.9 UNIT/L-% IV SOLN
INTRAVENOUS | Status: DC | PRN
  Administered 2024-06-17: 1000 mL

## 2024-06-17 MED ORDER — CHLORHEXIDINE GLUCONATE 4 % EX SOLN: 60.0000 mL | Freq: Once | CUTANEOUS | Status: DC

## 2024-06-17 MED ORDER — CHLORHEXIDINE GLUCONATE CLOTH 2 % EX PADS
6.0000 | MEDICATED_PAD | Freq: Every day | CUTANEOUS | Status: DC
  Administered 2024-06-18: 6 via TOPICAL

## 2024-06-17 MED ORDER — ATORVASTATIN CALCIUM 10 MG PO TABS
20.0000 mg | ORAL_TABLET | Freq: Every morning | ORAL | Status: DC
  Administered 2024-06-18 – 2024-06-19 (×2): 20 mg via ORAL
  Filled 2024-06-17 (×2): qty 2

## 2024-06-17 MED ORDER — HEPARIN SODIUM (PORCINE) 1000 UNIT/ML IJ SOLN
INTRAMUSCULAR | Status: DC | PRN
  Administered 2024-06-17: 12000 [IU] via INTRAVENOUS

## 2024-06-17 MED ORDER — ASPIRIN 81 MG PO TBEC
81.0000 mg | DELAYED_RELEASE_TABLET | Freq: Every day | ORAL | Status: DC
  Administered 2024-06-17 – 2024-06-19 (×3): 81 mg via ORAL
  Filled 2024-06-17 (×3): qty 1

## 2024-06-17 MED ORDER — CHLORHEXIDINE GLUCONATE 4 % EX SOLN
30.0000 mL | CUTANEOUS | Status: DC
  Filled 2024-06-17: qty 30

## 2024-06-17 MED ORDER — LIDOCAINE HCL (PF) 1 % IJ SOLN
INTRAMUSCULAR | Status: DC | PRN
  Administered 2024-06-17 (×2): 12 mL
  Administered 2024-06-17: 5 mL

## 2024-06-17 MED ORDER — ONDANSETRON HCL 4 MG/2ML IJ SOLN
4.0000 mg | Freq: Four times a day (QID) | INTRAMUSCULAR | Status: DC | PRN
  Administered 2024-06-17 – 2024-06-18 (×3): 4 mg via INTRAVENOUS
  Filled 2024-06-17 (×4): qty 2

## 2024-06-17 MED ORDER — ACETAMINOPHEN 650 MG RE SUPP: 650.0000 mg | Freq: Four times a day (QID) | RECTAL | Status: DC | PRN

## 2024-06-17 MED ORDER — NOREPINEPHRINE 4 MG/250ML-% IV SOLN
0.0000 ug/min | INTRAVENOUS | Status: DC
  Filled 2024-06-17: qty 250

## 2024-06-17 MED ORDER — SODIUM CHLORIDE 0.9 % IV SOLN: INTRAVENOUS | Status: DC | PRN

## 2024-06-17 MED ORDER — TAMSULOSIN HCL 0.4 MG PO CAPS: 0.4000 mg | ORAL_CAPSULE | Freq: Every day | ORAL | Status: DC | PRN

## 2024-06-17 MED ORDER — CEFAZOLIN SODIUM-DEXTROSE 2-4 GM/100ML-% IV SOLN
2.0000 g | Freq: Three times a day (TID) | INTRAVENOUS | Status: AC
  Administered 2024-06-17 – 2024-06-18 (×2): 2 g via INTRAVENOUS
  Filled 2024-06-17 (×2): qty 100

## 2024-06-17 NOTE — CV Procedure (Addendum)
 " [facility]  TAVR OPERATIVE NOTE   Date of Procedure:  06/17/2024  Preoperative Diagnosis: Severe Aortic Stenosis   Postoperative Diagnosis: Same   Procedure:   Transcatheter Aortic Valve Replacement - Transfemoral Approach  Edwards Sapien 3 THV (size 29 mm, model # [id number], serial # [id number] )   Co-Surgeons:  Lonni Cash, MD and Con Clunes , MD   Anesthesiologist:  Leonce  Echocardiographer:  Croitoru  Pre-operative Echo Findings: Severe aortic stenosis Normal left ventricular systolic function  Post-operative Echo Findings: No paravalvular leak Normal left ventricular systolic function  BRIEF CLINICAL NOTE AND INDICATIONS FOR SURGERY  84 yo male with DM, HTN, CAD and severe aortic stenosis.   During the course of the patient's preoperative work up they have been evaluated comprehensively by a multidisciplinary team of specialists coordinated through the Multidisciplinary Heart Valve Clinic in the [facility].  They have been demonstrated to suffer from symptomatic severe aortic stenosis as noted above. The patient has been counseled extensively as to the relative risks and benefits of all options for the treatment of severe aortic stenosis including long term medical therapy, conventional surgery for aortic valve replacement, and transcatheter aortic valve replacement.  The patient has been independently evaluated by Dr. Clunes with CT surgery and they are felt to be at high risk for conventional surgical aortic valve replacement. The surgeon indicated the patient would be a poor candidate for conventional surgery. Based upon review of all of the patient's preoperative diagnostic tests they are felt to be candidate for transcatheter aortic valve replacement using the transfemoral approach as an alternative to high risk conventional surgery.    Following the decision to proceed with transcatheter aortic valve replacement, a  discussion has been held regarding what types of management strategies would be attempted intraoperatively in the event of life-threatening complications, including whether or not the patient would be considered a candidate for the use of cardiopulmonary bypass and/or conversion to open sternotomy for attempted surgical intervention.  The patient has been advised of a variety of complications that might develop peculiar to this approach including but not limited to risks of death, stroke, paravalvular leak, aortic dissection or other major vascular complications, aortic annulus rupture, device embolization, cardiac rupture or perforation, acute myocardial infarction, arrhythmia, heart block or bradycardia requiring permanent pacemaker placement, congestive heart failure, respiratory failure, renal failure, pneumonia, infection, other late complications related to structural valve deterioration or migration, or other complications that might ultimately cause a temporary or permanent loss of functional independence or other long term morbidity.  The patient provides full informed consent for the procedure as described and all questions were answered preoperatively.    DETAILS OF THE OPERATIVE PROCEDURE  PREPARATION:   The patient is brought to the operating room on the above mentioned date and central monitoring was established by the anesthesia team including placement of a radial arterial line. The patient is placed in the supine position on the operating table.  Intravenous antibiotics are administered. Conscious sedation is used.   Baseline transthoracic echocardiogram was performed. The patient's chest, abdomen, both groins, and both lower extremities are prepared and draped in a sterile manner. A time out procedure is performed.   PERIPHERAL ACCESS:   Using the modified Seldinger technique, femoral arterial and venous access were obtained with placement of a 6 Fr sheath in the artery and a 7 Fr  sheath in the vein on the right side using u/s guidance.  A pigtail  diagnostic catheter was passed through the femoral arterial sheath under fluoroscopic guidance into the aortic root.  A temporary transvenous pacemaker catheter was passed through the femoral venous sheath under fluoroscopic guidance into the right ventricle.  The pacemaker was tested to ensure stable lead placement and pacemaker capture. Aortic root angiography was performed in order to determine the optimal angiographic angle for valve deployment.  TRANSFEMORAL ACCESS:  A micropuncture kit was used to gain access to the left femoral artery using u/s guidance. Position confirmed with angiography. Pre-closure with double ProGlide closure devices. The patient was heparinized systemically and ACT verified > 250 seconds.    A 16 Fr transfemoral E-sheath was introduced into the left femoral artery over an Amplatz superstiff wire. An AL-2 catheter was used to direct a straight-tip exchange length wire across the native aortic valve into the left ventricle. This was exchanged out for a pigtail catheter and position was confirmed in the LV apex. Simultaneous LV and Ao pressures were recorded.  The pigtail catheter was then exchanged for a Safari wire in the LV apex.   TRANSCATHETER HEART VALVE DEPLOYMENT:  An Edwards Sapien 3 THV (size 29 mm) was prepared and crimped per manufacturer's guidelines, and the proper orientation of the valve is confirmed on the [employer] delivery system. The valve was advanced through the introducer sheath using normal technique until in an appropriate position in the abdominal aorta beyond the sheath tip. The balloon was then retracted and using the fine-tuning wheel was centered on the valve. The valve was then advanced across the aortic arch using appropriate flexion of the catheter. The valve was carefully positioned across the aortic valve annulus. The Commander catheter was retracted using normal  technique. Once final position of the valve has been confirmed by angiographic assessment, the valve is deployed during rapid ventricular pacing to maintain systolic blood pressure < 50 mmHg and pulse pressure < 10 mmHg. The balloon inflation is held for >3 seconds after reaching full deployment volume. Once the balloon has fully deflated the balloon is retracted into the ascending aorta and valve function is assessed using TTE. There is felt to be no paravalvular leak and no central aortic insufficiency.  The patient's hemodynamic recovery following valve deployment is good.  The deployment balloon and guidewire are both removed.   PROCEDURE COMPLETION:  The sheath was then removed and closure devices were completed. Protamine  was administered once femoral arterial repair was complete. The temporary pacemaker, pigtail catheters and femoral sheaths were removed with a Mynx closure device placed in the artery and manual pressure used for venous hemostasis.    The patient tolerated the procedure well and is transported to the recovery room in stable condition. There were no immediate intraoperative complications.   No blood products were administered during the operation.  The patient received a total of 35 mL of intravenous contrast during the procedure.  LVEDP: 25 mmHg  Lonni Cash MD, FACC 06/17/2024 1:38 PM          "

## 2024-06-17 NOTE — Progress Notes (Addendum)
" °  HEART AND VASCULAR CENTER   MULTIDISCIPLINARY HEART VALVE TEAM  Patient doing well s/p TAVR. He is hemodynamically stable but requiring levophed . Groin sites stable. ECG with old RBBB and junctional rhythm and PVCs. RIJ pacer left in place but turned off. LVEDP 25mm hg at the time of TAVR. Will diurese when BP improves. Foley in place given previous issues with post op urinary retention. Will transfer to 2H when bed available.   Lamarr Hummer PA-C  [facility]  Pager [phone]  "

## 2024-06-17 NOTE — Anesthesia Preprocedure Evaluation (Addendum)
"                                    Anesthesia Evaluation  Patient identified by MRN, date of birth, ID band Patient awake    Reviewed: Allergy & Precautions, NPO status , Patient's Chart, lab work & pertinent test results  History of Anesthesia Complications Negative for: history of anesthetic complications  Airway Mallampati: II  TM Distance: >3 FB Neck ROM: Full    Dental  (+) Dental Advisory Given, Caps, Missing   Pulmonary neg pulmonary ROS   breath sounds clear to auscultation       Cardiovascular hypertension, Pt. on medications (-) angina + CAD (moderate LAD disease)  + Valvular Problems/Murmurs (severe, mean gradient 78 mmHg) AS  Rhythm:Regular Rate:Normal + Systolic murmurs 0/7974 ECHO: 1. Severe aortic valve stenosis. AVA VTI measures 0.94, mean gradient measures 50.3 mmHg, Vmax measures 4.97 m/s. Mild aortic insufficiency.   2. Dynamic LV mid cavity gradient, peak 41mm Hg. EF 70 to 75%. The left ventricle has hyperdynamic function.   3. The left ventricle has no regional wall motion abnormalities.   4.  Grade I diastolic dysfunction (impaired relaxation). The average left ventricular  global longitudinal strain is -20.6 %. The global longitudinal strain is normal.   5. RVF is normal. The right ventricular size is normal.   6. The mitral valve is degenerative. Trivial mitral valve regurgitation. No evidence of mitral stenosis. The mean mitral valve gradient is 2.7 mmHg with average heart rate of 66 bpm.     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), Insulin  Dependent, Oral Hypoglycemic Agents    Renal/GU negative Renal ROS   H/o prostate cancer    Musculoskeletal  (+) Arthritis ,    Abdominal   Peds  Hematology Hb 12.1, plt 180k   Anesthesia Other Findings   Reproductive/Obstetrics                               Anesthesia Physical Anesthesia Plan  ASA: 4  Anesthesia Plan: MAC   Post-op Pain Management: Tylenol  PO (pre-op)*   Induction:   PONV Risk Score and Plan: 1 and Treatment may vary due to age or medical condition  Airway Management Planned: Natural Airway and Simple Face Mask  Additional Equipment: None  Intra-op Plan:   Post-operative Plan:   Informed Consent: I have reviewed the patients History and Physical, chart, labs and discussed the procedure including the risks, benefits and alternatives for the proposed anesthesia with the patient or authorized representative who has indicated his/her understanding and acceptance.     Dental advisory given  Plan Discussed with: CRNA and Surgeon  Anesthesia Plan Comments:          Anesthesia Quick Evaluation  "

## 2024-06-17 NOTE — Op Note (Addendum)
 " [facility] VASC[facility]ENTER   MULTIDISCIPLINARY HEART VALVE TEAM   TAVR OPERATIVE NOTE   Date of Procedure:  06/17/2024  Preoperative Diagnosis: Severe Aortic Stenosis   Postoperative Diagnosis: Same   Procedure:   Transcatheter Aortic Valve Replacement - Percutaneous Transfemoral Approach  Edwards Sapien 3 Ultra Resilia (size 29 mm, model # [id number], serial # [id number]  )   Co-Surgeons:  Con Clunes, MD and Lonni Cash, MD   Anesthesiologist:  Leonce, MD  Echocardiographer:  Delford, MD  Pre-operative Echo Findings: Severe aortic stenosis Normal left ventricular systolic function  Post-operative Echo Findings: no paravalvular leak Normal left ventricular systolic function   BRIEF CLINICAL NOTE AND INDICATIONS FOR SURGERY  Benjamin Davila is a 84 y.o. male presents for evaluation of severe aortic stenosis and TAVR.  It was first diagnosed about 4-5 years ago when a physician in [place]  heard a murmur.  It has been followed by his cardiologist and it has been progressively getting more severe.     He reports dyspnea on exertion but attributes this mostly to back pain.  He has had multiple back operations but continues to deal with chronic back pain.  He denies chest pain, palpitations, leg swelling or syncope.  He is pretty functional and independent in his ADLs.  He lives with his wife in a house.   DETAILS OF THE OPERATIVE PROCEDURE  PREPARATION:    The patient was brought to the operating room on the above mentioned date and appropriate monitoring was established by the anesthesia team. The patient was placed in the supine position on the operating table.  Intravenous antibiotics were administered. The patient was monitored closely throughout the procedure under conscious sedation.  Baseline transthoracic echocardiogram was performed. The patient's abdomen and both groins were prepped and draped in a sterile manner. A time out procedure was  performed.   PERIPHERAL ACCESS:    Using the modified Seldinger technique, right internal jugular venous access was obtained with placement of 6 Fr sheath.  A temporary transvenous pacemaker catheter was passed through the internal jugular venous sheath under fluoroscopic guidance into the right ventricle.  The pacemaker was tested to ensure stable lead placement and pacemaker capture.  Then using the modified Seldinger technique, right femoral arterial access was obtained with placement of 6 Fr sheath. A pigtail diagnostic catheter was passed through the right femoral arterial sheath under fluoroscopic guidance into the aortic root.  Aortic root angiography was performed in order to determine the optimal angiographic angle for valve deployment.   TRANSFEMORAL ACCESS:   Percutaneous transfemoral access and sheath placement was performed using ultrasound guidance.  The left common femoral artery was cannulated using a micropuncture needle and appropriate location was verified using hand injection angiogram.  A pair of Abbott Perclose percutaneous closure devices were placed and a 6 French sheath replaced into the femoral artery.  The patient was heparinized systemically and ACT verified > 250 seconds.    A 16 Fr transfemoral E-sheath was introduced into the left common femoral artery after progressively dilating over an Amplatz superstiff wire. An AL-1 catheter was used to direct a straight-tip exchange length wire across the native aortic valve into the left ventricle. This was exchanged out for a pigtail catheter and position was confirmed in the LV apex. Simultaneous LV and Ao pressures were recorded.  The LVEDP was 24 mmHg.  The pigtail catheter was exchanged for a Safari wire in the LV apex.   TRANSCATHETER HEART VALVE DEPLOYMENT:   An  Edwards Sapien 3 Ultra transcatheter heart valve (29 mm) was prepared and crimped per manufacturer's guidelines, and the proper orientation of the valve was  confirmed on the [employer] delivery system. The valve was advanced through the introducer sheath using normal technique until in an appropriate position in the abdominal aorta beyond the sheath tip. The balloon was then retracted and using the fine-tuning wheel was centered on the valve. The valve was then advanced across the aortic arch using appropriate flexion of the catheter. The valve was carefully positioned across the aortic valve annulus. The Commander catheter was retracted using normal technique. Once final position of the valve was confirmed by angiographic assessment, the valve was deployed during rapid ventricular pacing to maintain systolic blood pressure < 50 mmHg and pulse pressure < 10 mmHg. The balloon inflation was held for >3 seconds after reaching full deployment volume. Once the balloon was fully deflated the balloon was retracted into the ascending aorta and valve function was assessed using echocardiography. There was felt to be no paravalvular leak and no central aortic insufficiency.  The patient's hemodynamic recovery following valve deployment was good.  The deployment balloon and guidewire were both removed.    PROCEDURE COMPLETION:   The sheath was removed and femoral artery closure performed.  Protamine  was administered once femoral arterial repair was complete. The pigtail catheter and femoral sheath was removed and a Mynx femoral closure device was utilized following removal of the diagnostic sheath in the right femoral artery.  The venous sheath in the right internal jugular vein and the temporary pacemaker was left in place as the heart rate appeared irregular and possibly complete heart block.  The patient tolerated the procedure well and was transported to the cath lab recovery area in stable condition. There were no immediate intraoperative complications. All sponge instrument and needle counts were verified correct at completion of the operation.   No blood  products were administered during the operation.  The patient received a total of 35 mL of intravenous contrast during the procedure.   Con GORMAN Clunes, MD 06/17/2024 2:09 PM      "

## 2024-06-17 NOTE — Discharge Instructions (Signed)
 ACTIVITY AND EXERCISE  Daily activity and exercise are an important part of your recovery. People recover at different rates depending on their general health and type of valve procedure.  Most people recovering from TAVR feel better relatively quickly   No lifting, pushing, pulling more than 10 pounds (examples to avoid: groceries, vacuuming, gardening, golfing):             - For one week with a procedure through the groin.             - For six weeks for procedures through the chest wall or neck. NOTE: You will typically see one of our providers 7-14 days after your procedure to discuss WHEN TO RESUME the above activities.      DRIVING  Do not drive until you are seen for follow up and cleared by a provider. Generally, we ask patient to not drive for 1 week after their procedure.  If you have been told by your doctor in the past that you may not drive, you must talk with him/her before you begin driving again.   DRESSING  Groin site: you may leave the clear dressing over the site until follow up or remove 3-5 days post procedure.   HYGIENE  If you had a femoral (leg) procedure, you may take a shower when you return home. After the shower, pat the site dry. Do NOT use powder, oils or lotions in your groin area until the site has completely healed.   If you had a chest procedure, you may shower when you return home unless specifically instructed not to by your discharging practitioner.             - DO NOT scrub incision; pat dry with a towel.             - DO NOT apply any lotions, oils, powders to the incision.             - No tub baths / swimming for at least 2 weeks.  If you notice any fevers, chills, increased pain, swelling, bleeding or pus, please contact your provider.   ADDITIONAL INFORMATION  If you are going to have an upcoming dental procedure, please contact our office as you will require antibiotics ahead of time to prevent infection on your heart valve.    If you have any  questions or concerns you can call the structural heart phone during normal business hours 8am-4pm. If you have an urgent need after hours or weekends please call [phone] to talk to the on call provider for general cardiology. If you have an emergency that requires immediate attention, please call 911.    After TAVR Checklist  Check  Test Description   Follow up appointment in 1-2 weeks  You will see our structural heart advanced practice provider. Your incision sites will be checked and you will be cleared to drive and resume all normal activities if you are doing well.     1 month echo and follow up (23-75 days) You will have an echo to check on your new heart valve and be seen back in the office by a structural heart advanced practice provider.   Follow up with your primary cardiologist You will need to be seen by your primary cardiologist in the following 3-6 months after your 1 month appointment in the valve clinic.    1 year echo and follow up (60 days +/- the anniversary of your TAVR) You will have another echo  to check on your heart valve after 1 year and be seen back in the office by a structural heart advanced practice provider. This your last structural heart visit.   Bacterial endocarditis prophylaxis  You will have to take antibiotics for the rest of your life before all dental procedures (even teeth cleanings) to protect your heart valve. Antibiotics are also required before some surgeries. Please check with your cardiologist before scheduling any surgeries. Also, please make sure to tell us  if you have a penicillin allergy as you will require an alternative antibiotic.        After Your Pacemaker   You have a Abbott Pacemaker  If you have a Medtronic or Biotronik device, plug in your home monitor once you get home, and no manual interaction is required.   If you have an Abbott or [employer]one device, plug your home monitor once you get home, sit near the device, and  press the large activation button. Sit nearby until the process is complete, usually notated by lights on the monitor.   If you were set up for monitoring using an app on your phone, make sure the app remains open in the background and the Bluetooth remains on.  ACTIVITY Do not lift your arm above shoulder height for 1 week after your procedure. After 7 days, you may progress as below.  You should remove your sling 24 hours after your procedure, unless otherwise instructed by your provider.     Thursday June 25, 2024  Friday June 26, 2024 Saturday June 27, 2024 Sunday June 28, 2024   Do not lift, push, pull, or carry anything over 10 pounds with the affected arm until 6 weeks (Thursday July 30, 2024 ) after your procedure.   You may drive AFTER your wound check, unless you have been told otherwise by your provider.   Ask your healthcare provider when you can go back to work   INCISION/Dressing If you are on a blood thinner such as Coumadin, Xarelto, Eliquis, Plavix, or Pradaxa please confirm with your provider when this should be resumed.    If large square, outer bandage is left in place, this can be removed after 24 hours from your procedure. Do not remove steri-strips or glue as below.   If a PRESSURE DRESSING (a bulky dressing that usually goes up over your shoulder) was applied or left in place, please follow instructions given by your provider on when to return to have this removed.   Monitor your Pacemaker site for redness, swelling, and drainage. Call the device clinic at [phone] if you experience these symptoms or fever/chills.  If your incision is sealed with Dermabond (glue), you may shower 24 hrs after your procedure or when told by your provider. Do not remove the glue or let the shower hit directly on your site. You may wash around your site with soap and water .    If you were discharged in a sling, please do not wear this during the day more than 48 hours  after your surgery unless otherwise instructed. This may increase the risk of stiffness and soreness in your shoulder.   Avoid lotions, ointments, or perfumes over your incision until it is well-healed.  You may use a hot tub or a pool AFTER your wound check appointment if the incision is completely closed.  Pacemaker Alerts:  Some alerts are vibratory and others beep. These are NOT emergencies. Please call our office to let us  know. If this occurs at night  or on weekends, it can wait until the next business day. Send a remote transmission.  If your device is capable of reading fluid status (for heart failure), you will be offered monthly monitoring to review this with you.   DEVICE MANAGEMENT Remote monitoring is used to monitor your pacemaker from home. This monitoring is scheduled every 91 days by our office. It allows us  to keep an eye on the functioning of your device to ensure it is working properly. You will routinely see your Electrophysiologist annually (more often if necessary).  This will appear as a REMOTE check on your MyChart schedule. These are automatic and there is nothing for you to manually do unless otherwise instructed.  You should receive your ID card for your new device in 4-8 weeks. Keep this card with you at all times once received. Consider wearing a medical alert bracelet or necklace.  Your Pacemaker may be MRI compatible. This will be discussed at your next office visit/wound check.  You should avoid contact with strong electric or magnetic fields.   Do not use amateur (ham) radio equipment or electric (arc) welding torches. MP3 player headphones with magnets should not be used. Some devices are safe to use if held at least 12 inches (30 cm) from your Pacemaker. These include power tools, lawn mowers, and speakers. If you are unsure if something is safe to use, ask your health care provider.  When using your cell phone, hold it to the ear that is on the opposite side  from the Pacemaker. Do not leave your cell phone in a pocket over the Pacemaker.  You may safely use electric blankets, heating pads, computers, and microwave ovens.  Call the office right away if: You have chest pain. You feel more short of breath than you have felt before. You feel more light-headed than you have felt before. Your incision starts to open up.  This information is not intended to replace advice given to you by your health care provider. Make sure you discuss any questions you have with your health care provider.

## 2024-06-17 NOTE — Anesthesia Postprocedure Evaluation (Signed)
"   Anesthesia Post Note  Patient: Benjamin Davila  Procedure(s) Performed: Transcatheter Aortic Valve Replacement, Transfemoral (Left) ECHOCARDIOGRAM, TRANSTHORACIC     Patient location during evaluation: Cath Lab Anesthesia Type: MAC Level of consciousness: oriented, patient cooperative and awake and alert Pain management: pain level controlled Vital Signs Assessment: post-procedure vital signs reviewed and stable Respiratory status: spontaneous breathing, nonlabored ventilation, respiratory function stable and patient connected to nasal cannula oxygen Cardiovascular status: blood pressure returned to baseline and stable Postop Assessment: no apparent nausea or vomiting Anesthetic complications: no   There were no known notable events for this encounter.  Last Vitals:  Vitals:   06/17/24 1415 06/17/24 1420  BP: (!) 87/52   Pulse: 68 66  Resp: 13 16  Temp:    SpO2: 98% 91%    Last Pain:  Vitals:   06/17/24 0900  TempSrc:   PainSc: 0-No pain                 [name],E. Tynesha Free      "

## 2024-06-17 NOTE — Progress Notes (Signed)
" °  Echocardiogram 2D Echocardiogram has been performed.  Chavon Lucarelli 06/17/2024, 1:12 PM "

## 2024-06-17 NOTE — Progress Notes (Signed)
" °  [facility]   MULTIDISCIPLINARY HEART VALVE TEAM  Called to bedside as pt had HRs in 40s associated with nausea. Still hypotensive requiring levophed . Nausea improved with Zofran . Plugged pacer back in and started pacing at 70s. SBP improved to high 120s. Plan to keep him pacing at 70 bpm and wean levophed .   Izetta Hummer PA-C  [facility]   "

## 2024-06-17 NOTE — Transfer of Care (Signed)
 Immediate Anesthesia Transfer of Care Note  Patient: Benjamin Davila  Procedure(s) Performed: Transcatheter Aortic Valve Replacement, Transfemoral (Left) ECHOCARDIOGRAM, TRANSTHORACIC  Patient Location: PACU and Cath Lab  Anesthesia Type:MAC  Level of Consciousness: awake and alert   Airway & Oxygen Therapy: Patient Spontanous Breathing and Patient connected to face mask oxygen  Post-op Assessment: Report given to RN and Post -op Vital signs reviewed and stable  Post vital signs: Reviewed and stable  Last Vitals:  Vitals Value Taken Time  BP 134/68 06/17/24 13:50  Temp    Pulse 61 06/17/24 13:58  Resp 18 06/17/24 13:58  SpO2 98 % 06/17/24 13:58  Vitals shown include unfiled device data.  Last Pain:  Vitals:   06/17/24 0900  TempSrc:   PainSc: 0-No pain         Complications: There were no known notable events for this encounter.

## 2024-06-17 NOTE — Interval H&P Note (Signed)
 History and Physical Interval Note:  06/17/2024 11:47 AM  Benjamin Davila  has presented today for surgery, with the diagnosis of severe aortic stenosis.  The various methods of treatment have been discussed with the patient and family. After consideration of risks, benefits and other options for treatment, the patient has consented to  Procedures: Transcatheter Aortic Valve Replacement, Transfemoral (Left) ECHOCARDIOGRAM, TRANSTHORACIC (N/A) as a surgical intervention.  The patient's history has been reviewed, patient examined, no change in status, stable for surgery.  I have reviewed the patient's chart and labs.  Questions were answered to the patient's satisfaction.     Con RAMAN Otha Rickles

## 2024-06-17 NOTE — Interval H&P Note (Signed)
 History and Physical Interval Note:  06/17/2024 11:42 AM  Benjamin Davila  has presented today for surgery, with the diagnosis of severe aortic stenosis.  The various methods of treatment have been discussed with the patient and family. After consideration of risks, benefits and other options for treatment, the patient has consented to  Procedures: Transcatheter Aortic Valve Replacement, Transfemoral (Left) ECHOCARDIOGRAM, TRANSTHORACIC (N/A) as a surgical intervention.  The patient's history has been reviewed, patient examined, no change in status, stable for surgery.  I have reviewed the patient's chart and labs.  Questions were answered to the patient's satisfaction.     Lonni Cash

## 2024-06-17 NOTE — Discharge Summary (Addendum)
 " [facility]   MULTIDISCIPLINARY HEART VALVE TEAM  Discharge Summary    Patient ID: Benjamin Davila MRN: [id number]; DOB: 04/08/1941  Admit date: 06/17/2024 Discharge date: 06/19/2024  PCP:  Jerrell Cleatus Ned, MD  [facility] Cardiologist:  Jennifer JONELLE Crape, MD  [facility] Structural heart: Lonni Cash, MD [facility] Electrophysiologist:  [name], MD   Discharge Diagnoses    Principal Problem:   S/P TAVR (transcatheter aortic valve replacement) Active Problems:   Malignant neoplasm of prostate (HCC)   Type 2 diabetes mellitus with diabetic neuropathy, unspecified (HCC)   Spinal stenosis of lumbar region   Iron deficiency anemia, unspecified   Essential hypertension   Severe aortic stenosis   RBBB   S/P placement of cardiac pacemaker   Allergies Allergies[1]  Diagnostic Studies/Procedures    [facility]  TAVR OPERATIVE NOTE     Date of Procedure:                06/17/2024   Preoperative Diagnosis:      Severe Aortic Stenosis    Postoperative Diagnosis:    Same    Procedure:        Transcatheter Aortic Valve Replacement - Transfemoral Approach             Edwards Sapien 3 THV (size 29 mm, model # [id number], serial # [id number] )              Co-Surgeons:                        Lonni Cash, MD and Con Clunes , MD    Anesthesiologist:                  Leonce   Echocardiographer:              Croitoru   Pre-operative Echo Findings: Severe aortic stenosis Normal left ventricular systolic function   Post-operative Echo Findings: No paravalvular leak Normal left ventricular systolic function  _____________    Echo 06/18/24:  IMPRESSIONS   1. Left ventricular ejection fraction, by estimation, is 60 to 65%. The  left ventricle has normal function. The left ventricle has no regional  wall motion abnormalities. There is moderate concentric left ventricular  hypertrophy. Left ventricular   diastolic function could not be evaluated.   2. Right ventricular systolic function is normal. The right ventricular  size is normal.   3. Left atrial size was mildly dilated.   4. The mitral valve is degenerative. Trivial mitral valve regurgitation.  No evidence of mitral stenosis.   5. The aortic valve has been repaired/replaced. Aortic valve  regurgitation is not visualized. No aortic stenosis is present. There is a  29 mm Edwards Ultra, stented (TAVR) valve present in the aortic position.  Procedure Date: 06/17/2024. Aortic valve  mean gradient measures 11.0 mmHg. Aortic valve Vmax measures 2.66 m/s.  Normal TAVR function with no AS or AI.   ___________________  S/p PPM 06/18/24 Procedure:  LEFT sided Abbott Dual Chamber PPM Implant   Pre-op Diagnosis: CHB    Post-op Diagnosis: same   Procedure Date: 06/18/24   Attending: Adina Primus, MD   History of Present Illness     Benjamin Davila is a 84 y.o. male with a history of prostate cancer, HTN, T2DM on insulin , RBBB, CAD and severe AS who presented to [facility] on [facility]24 for planned TAVR.   Echo 02/18/24 showed EF 70%, dynamic  LV mid cavity gradient, mild AI and severe AS with mean grad 50.3 mmHg, Vmax 4.97 m/s, AVA 0.94 cm2. [facility] 04/23/24 showed moderate single-vessel coronary artery disease with 60% mid/distal LAD stenosis and 50% ostial D2 lesion and no angiographically significant disease noted in the LMCA, LCx, or RCA as well as severe aortic valve stenosis (mean gradient 78 mmHg, AVA 0.7 cm^2; cannot exclude a component of intracavitary/subaortic obstruction contributing to severely elevated LVOT gradient.   The patient was evaluated by the multidisciplinary valve team and felt to have severe, symptomatic aortic stenosis and to be a suitable candidate for TAVR, which was set up for 06/17/24.   Hospital Course     Consultants: none   Severe AS:  -- S/p TAVR with a 29 mm Edwards Sapien 3 Ultra Resilia THV via the TF approach  on 06/17/24.  -- Post operative echo showed EF 60%, mod cLVH, normally functioning TAVR with a mean gradient of 11 mmHg and no PVL. -- Groin sites are stable. -- Continue Asprin 81mg  daily. -- Met with cardiac rehab to discuss CRP phase II.  -- Plan for discharge home today with close follow up in the outpatient setting.   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  -- S/p LEFT sided Abbott Dual Chamber PPM Implant by Dr. Dorette on 06/18/24. -- CXR with no PTX. -- Follow up with EP arranged.   Acute HFpEF: -- LVEDP 25 mm hg at the time of TAVR.  -- CXR today with mild interstial edema and right pleural effusion (new from previous).  -- Given IV Lasix  40mg  x1. -- Will discharge with lasix  20mg  x5 days.   DMT2:  -- Treated with SSI while admitted.  -- Resume home meds at discharge.  -- Okay to resume Metformin  after 48 hours after contrast dye exposure (06/19/24).  Urinary retention: -- Foley placed at the pts request.  -- Continue tamsulosin  0.4mg  daily.   HTN: -- BP well controlled. -- Resume home losartan  100mg  daily.    _____________  Discharge Vitals Blood pressure 114/60, pulse 84, temperature 98.5 F (36.9 C), temperature source Oral, resp. rate (!) 21, height 5' 9 (1.753 m), weight 83 kg, SpO2 90%.  Filed Weights   06/17/24 0825 06/18/24 0500 06/19/24 0500  Weight: 80.3 kg 84.9 kg 83 kg     GEN: Well nourished, well developed in no acute distress NECK: No JVD CARDIAC: RRR, no murmurs, rubs, gallops. Pacer incision with mild swelling.  RESPIRATORY:  Clear to auscultation without rales, wheezing or rhonchi  ABDOMEN: Soft, non-tender, non-distended EXTREMITIES:  No edema; No deformity.  Groin sites clear without hematoma or ecchymosis.    Disposition   Pt is being discharged home today in good condition.  Follow-up Plans & Appointments     Follow-up Information     Verlin Lonni BIRCH, MD. Go on 06/25/2024.   Specialty:  Cardiology Why: @ 9:20am, please arrive at least 20 minutes early Contact information: [address] [place] [address] [phone]                  Discharge Medications   Allergies as of 06/19/2024       Reactions   Lisinopril Cough        Medication List     STOP taking these medications    metoprolol  tartrate 50 MG tablet Commonly known as: LOPRESSOR        TAKE these medications    acetaminophen  325 MG tablet Commonly known as:  TYLENOL  Take 650 mg by mouth daily as needed (pain).   aspirin  EC 81 MG tablet Take 1 tablet (81 mg total) by mouth daily. Swallow whole.   atorvastatin  20 MG tablet Commonly known as: LIPITOR Take 1 tablet (20 mg total) by mouth every morning.   diazepam  5 MG tablet Commonly known as: VALIUM  Take 1 tablet (5 mg total) by mouth at bedtime as needed for anxiety (moods,and muscle spasm).   ferrous sulfate  325 (65 FE) MG tablet Take 325 mg by mouth daily.   furosemide  20 MG tablet Commonly known as: Lasix  Take 1 tablet (20 mg total) by mouth daily.   KRILL OIL PO Take 1 capsule by mouth daily.   Lantus  SoloStar 100 UNIT/ML Solostar Pen Generic drug: insulin  glargine Inject 15 Units into the skin daily before breakfast.   losartan  100 MG tablet Commonly known as: COZAAR  Take 1 tablet (100 mg total) by mouth daily.   metFORMIN  500 MG tablet Commonly known as: GLUCOPHAGE  Take 1,000 mg by mouth 2 (two) times a day.   MULTIVITAMIN ADULT PO Take 1 tablet by mouth daily.   tamsulosin  0.4 MG Caps capsule Commonly known as: FLOMAX  Take 0.4 mg by mouth daily as needed (infrequent urination).   Testosterone 20.25 MG/1.25GM (1.62%) Gel Place 2 Pump onto the skin daily.          Outstanding Labs/Studies   none  ______________________  Duration of Discharge Encounter: APP Time: 15 minutes    Signed, Lamarr Hummer, PA-C 06/19/2024, 9:23 AM [phone]  Benjamin Davila was seen by me today  along with Izetta Hummer, PA-C. I have personally performed an evaluation on this patient.  My findings are as follows:  84 y.o. male with severe AS, now s/p TAVR. CHB post TAVR. Permanent pacemaker placed yesterday.   Data: EKG(s) and pertinent labs, studies, etc were personally reviewed and interpreted by me:  Telemetry reviewed by me: Paced All labs reviewed by me Otherwise, I agree with data as outlined by the advanced practice provider.  Exam performed by me: Gen: NAD Neck: No JVD Cardiac: RRR without murmur Lungs: clear bilaterally Extremities: No LE edema  My Assessment and Plan:  Severe AS s/p TAVR: BP stable. Groins stable. Valve working well by echo. I have personally reviewed the echo images. Continue ASA  CHB: Permanent pacemaker placed.   The patient will be discharged home today  I spent 20 minutes seeing the patient. During that time, I reviewed the labs, telemetry, echo images, evaluated their symptoms and performed an examination. This time also included plan formulation, discussion of the plan with the patient and the time spent with documentation.   Signed,  Lonni Cash, MD  06/19/2024 11:42 AM       [1]  Allergies Allergen Reactions   Lisinopril Cough   "

## 2024-06-17 NOTE — Anesthesia Procedure Notes (Signed)
 Procedure Name: MAC Date/Time: 06/17/2024 11:52 AM  Performed by: Boyce Shilling, CRNAPre-anesthesia Checklist: Patient identified, Emergency Drugs available, Suction available, Timeout performed and Patient being monitored Patient Re-evaluated:Patient Re-evaluated prior to induction Oxygen Delivery Method: Simple face mask Induction Type: IV induction Dental Injury: Teeth and Oropharynx as per pre-operative assessment

## 2024-06-18 ENCOUNTER — Encounter (HOSPITAL_COMMUNITY): Payer: Self-pay | Admitting: Cardiovascular Disease

## 2024-06-18 ENCOUNTER — Inpatient Hospital Stay (HOSPITAL_COMMUNITY)

## 2024-06-18 ENCOUNTER — Encounter (HOSPITAL_COMMUNITY)
Admission: RE | Disposition: A | Discharge status: Home / Self Care | Payer: Self-pay | Source: Home / Self Care | Attending: Cardiovascular Disease

## 2024-06-18 DIAGNOSIS — Z952 Presence of prosthetic heart valve: Secondary | ICD-10-CM | POA: Diagnosis not present

## 2024-06-18 DIAGNOSIS — Z950 Presence of cardiac pacemaker: Secondary | ICD-10-CM

## 2024-06-18 LAB — GLUCOSE, CAPILLARY
Glucose-Capillary: 150 mg/dL — ABNORMAL HIGH (ref 70–99)
Glucose-Capillary: 148 mg/dL — ABNORMAL HIGH (ref 70–99)
Glucose-Capillary: 123 mg/dL — ABNORMAL HIGH (ref 70–99)
Glucose-Capillary: 132 mg/dL — ABNORMAL HIGH (ref 70–99)

## 2024-06-18 LAB — ECHOCARDIOGRAM COMPLETE
AR max vel: 3.24 cm2
AV Area VTI: 5 cm2
AV Area mean vel: 2.84 cm2
AV Mean grad: 11 mmHg
AV Peak grad: 28.3 mmHg
Ao pk vel: 2.66 m/s
Height: 69 in
S' Lateral: 2.3 cm
Weight: 2994.73 [oz_av]

## 2024-06-18 LAB — CBC
HCT: 31.3 % — ABNORMAL LOW (ref 39.0–52.0)
Hemoglobin: 10.6 g/dL — ABNORMAL LOW (ref 13.0–17.0)
MCH: 32 pg (ref 26.0–34.0)
MCHC: 33.9 g/dL (ref 30.0–36.0)
MCV: 94.6 fL (ref 80.0–100.0)
Platelets: 146 10*3/uL — ABNORMAL LOW (ref 150–400)
RBC: 3.31 MIL/uL — ABNORMAL LOW (ref 4.22–5.81)
RDW: 12.2 % (ref 11.5–15.5)
WBC: 6.2 10*3/uL (ref 4.0–10.5)
nRBC: 0 % (ref 0.0–0.2)

## 2024-06-18 LAB — BASIC METABOLIC PANEL WITH GFR
Anion gap: 8 (ref 5–15)
BUN: 14 mg/dL (ref 8–23)
CO2: 27 mmol/L (ref 22–32)
Calcium: 8.9 mg/dL (ref 8.9–10.3)
Chloride: 102 mmol/L (ref 98–111)
Creatinine, Ser: 0.93 mg/dL (ref 0.61–1.24)
GFR, Estimated: >60 mL/min
Glucose, Bld: 168 mg/dL — ABNORMAL HIGH (ref 70–99)
Potassium: 4.1 mmol/L (ref 3.5–5.1)
Sodium: 137 mmol/L (ref 135–145)

## 2024-06-18 LAB — POCT ACTIVATED CLOTTING TIME: Activated Clotting Time: 225 s

## 2024-06-18 MED ORDER — FENTANYL CITRATE (PF) 100 MCG/2ML IJ SOLN
INTRAMUSCULAR | Status: AC
  Filled 2024-06-18: qty 2

## 2024-06-18 MED ORDER — SODIUM CHLORIDE 0.9% FLUSH: 3.0000 mL | INTRAVENOUS | Status: DC | PRN

## 2024-06-18 MED ORDER — ONDANSETRON HCL 4 MG/2ML IJ SOLN
INTRAMUSCULAR | Status: DC | PRN
  Administered 2024-06-18: 4 mg via INTRAVENOUS

## 2024-06-18 MED ORDER — LIDOCAINE HCL (PF) 1 % IJ SOLN
INTRAMUSCULAR | Status: DC | PRN
  Administered 2024-06-18: 30 mL

## 2024-06-18 MED ORDER — CEFAZOLIN SODIUM-DEXTROSE 1-4 GM/50ML-% IV SOLN
INTRAVENOUS | Status: AC | PRN
  Administered 2024-06-18: 2 g via INTRAVENOUS

## 2024-06-18 MED ORDER — CEFAZOLIN SODIUM-DEXTROSE 2-4 GM/100ML-% IV SOLN
INTRAVENOUS | Status: AC
  Filled 2024-06-18: qty 100

## 2024-06-18 MED ORDER — MIDAZOLAM HCL 5 MG/5ML IJ SOLN
INTRAMUSCULAR | Status: DC | PRN
  Administered 2024-06-18: 1 mg via INTRAVENOUS

## 2024-06-18 MED ORDER — SODIUM CHLORIDE 0.9 % IV SOLN
INTRAVENOUS | Status: AC
  Filled 2024-06-18: qty 2

## 2024-06-18 MED ORDER — CHLORHEXIDINE GLUCONATE 4 % EX SOLN: 60.0000 mL | Freq: Once | CUTANEOUS | Status: DC

## 2024-06-18 MED ORDER — SODIUM CHLORIDE 0.9 % IV SOLN: INTRAVENOUS | Status: DC

## 2024-06-18 MED ORDER — SODIUM CHLORIDE 0.9 % IV SOLN
80.0000 mg | INTRAVENOUS | Status: DC
  Filled 2024-06-18: qty 2

## 2024-06-18 MED ORDER — MIDAZOLAM HCL 2 MG/2ML IJ SOLN
INTRAMUSCULAR | Status: AC
  Filled 2024-06-18: qty 2

## 2024-06-18 MED ORDER — FENTANYL CITRATE (PF) 100 MCG/2ML IJ SOLN
INTRAMUSCULAR | Status: DC | PRN
  Administered 2024-06-18: 50 ug via INTRAVENOUS

## 2024-06-18 MED ORDER — ONDANSETRON HCL 4 MG/2ML IJ SOLN
INTRAMUSCULAR | Status: AC
  Filled 2024-06-18: qty 2

## 2024-06-18 MED ORDER — SODIUM CHLORIDE 0.9 % IV SOLN
12.5000 mg | INTRAVENOUS | Status: AC
  Administered 2024-06-18: 12.5 mg via INTRAVENOUS
  Filled 2024-06-18: qty 12.5

## 2024-06-18 MED ORDER — CHLORHEXIDINE GLUCONATE CLOTH 2 % EX PADS
6.0000 | MEDICATED_PAD | Freq: Every day | CUTANEOUS | Status: DC
  Administered 2024-06-19: 6 via TOPICAL

## 2024-06-18 MED ORDER — CHLORHEXIDINE GLUCONATE 4 % EX SOLN
CUTANEOUS | Status: AC
  Administered 2024-06-18: 4 via TOPICAL
  Filled 2024-06-18: qty 60

## 2024-06-18 MED ORDER — CHLORHEXIDINE GLUCONATE 4 % EX SOLN: 60.0000 mL | Freq: Once | CUTANEOUS | Status: AC

## 2024-06-18 MED ORDER — SODIUM CHLORIDE 0.9% FLUSH
3.0000 mL | Freq: Two times a day (BID) | INTRAVENOUS | Status: DC
  Administered 2024-06-18 – 2024-06-19 (×2): 3 mL via INTRAVENOUS

## 2024-06-18 MED ORDER — CEFAZOLIN SODIUM-DEXTROSE 2-4 GM/100ML-% IV SOLN: 2.0000 g | INTRAVENOUS | Status: DC

## 2024-06-18 NOTE — H&P (View-Only) (Signed)
 "   ELECTROPHYSIOLOGY CONSULT NOTE    Patient ID: [facility] MRN: [id number], DOB/AGE: Sep 26, 1940 84 y.o.  Admit date: 06/17/2024 Date of Consult: 06/18/2024  Primary Physician: Benjamin Cleatus Ned, MD Primary Cardiologist: Benjamin JONELLE Crape, MD  Electrophysiologist: Dr. Almetta   Referring Provider: Dr. Verlin  Patient Profile: [facility] is a 84 y.o. male with a history of RBBB, HTN, severe AS, CAD, DM who is being seen today for the evaluation of complete heart block at the request of Dr. Verlin.  HPI:  [facility] is a 84 y.o. male who was admitted for planned TAVR.    On 06/17/24 he underwent placement of Edwards Sapien 3 THV - 29 mm. He had no paravalvular leak and normal LV function on ECHO. Pre-procedure EKG review shows RBBB. 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  Post-procedure labs > Na 137, K 4.1, Cr 0.93, WBC 6.2, Hgb 10.6, and platelets 146.   He reports short term memory issues and nausea this am. States his wife keeps up with his schedule.   He denies chest pain, palpitations, dyspnea, PND, orthopnea, vomiting, dizziness, syncope, edema, weight gain, or early satiety.   Labs       PLT  146* (01/29 0903) HGB  10.6* (01/29 0903) WBC 6.2 (01/29 0903)  .    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 Verlin and Dr. 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   Type 2 diabetes mellitus with diabetic neuropathy, unspecified (HCC)  07/12/2020     Surgical History:  Past Surgical History:  Procedure Laterality Date   BACK SURGERY     lumbar surgery 20 years ago per patient   CATARACT EXTRACTION     CHOLECYSTECTOMY     20 years ago per patient   COLONOSCOPY     INTRAOPERATIVE TRANSTHORACIC ECHOCARDIOGRAM N/A 06/17/2024   Procedure: ECHOCARDIOGRAM, TRANSTHORACIC;  Surgeon: [facility] BIRCH, MD;  Location: [facility];  Service: Cardiovascular;  Laterality: N/A;   LAMINECTOMY WITH POSTERIOR LATERAL ARTHRODESIS LEVEL 2 Left 03/27/2023   Procedure: Left Lumbar Two-Three, Lumbar Three-Four Hemifacetectomy, posterolateral fusion Lumbar Two-Four, segmental fixation Lumbar Two-Four;  Surgeon: Benjamin Alm Hamilton, MD;  [facility];  Service: Neurosurgery;  Laterality: Left;   LUMBAR LAMINECTOMY/DECOMPRESSION MICRODISCECTOMY Bilateral 08/09/2021   Procedure: Lumbar decompressive laminectomy  - Lumbar two-Lumabr three - Lumbar three-Lumbar four - Lumbar four-Lumbar five - Lumabr five-Sacal one - Bilateral, Posterior Lateral Fusion Lumbar three-four-Right;  Surgeon: Benjamin Alm RAMAN, MD;  [facility];  Service: Neurosurgery;  Laterality: Bilateral;   RIGHT/LEFT HEART CATH AND CORONARY ANGIOGRAPHY N/A 04/23/2024   Procedure: RIGHT/LEFT HEART CATH AND CORONARY ANGIOGRAPHY;  Surgeon: Benjamin Lonni, MD;  Location: [facility];  Service: Cardiovascular;  Laterality: N/A;   SPINE SURGERY     TONSILLECTOMY     as a kid     Medications Prior to Admission  Medication Sig Dispense Refill Last  Dose/Taking   acetaminophen  (TYLENOL ) 325 MG tablet Take 650 mg by mouth daily as needed (pain).   Past Week   aspirin  EC 81 MG tablet Take 1 tablet (81 mg total) by mouth daily. Swallow whole.   06/16/2024   atorvastatin  (LIPITOR) 20 MG tablet Take 1 tablet (20 mg total) by mouth every morning. 90 tablet 3 06/16/2024   diazepam  (VALIUM ) 5 MG tablet Take 1 tablet (5 mg total) by mouth at bedtime as needed for anxiety  (moods,and muscle spasm). 30 tablet 0 Past Week   ferrous sulfate  325 (65 FE) MG tablet Take 325 mg by mouth daily.   Past Week   insulin  glargine (LANTUS  SOLOSTAR) 100 UNIT/ML Solostar Pen Inject 15 Units into the skin daily before breakfast.   06/16/2024   KRILL OIL PO Take 1 capsule by mouth daily.   Past Week   losartan  (COZAAR ) 100 MG tablet Take 1 tablet (100 mg total) by mouth daily. 90 tablet 3 06/16/2024   metFORMIN  (GLUCOPHAGE ) 500 MG tablet Take 1,000 mg by mouth 2 (two) times a day.   06/14/2024   Multiple Vitamin (MULTIVITAMIN ADULT PO) Take 1 tablet by mouth daily.   Past Week   tamsulosin  (FLOMAX ) 0.4 MG CAPS capsule Take 0.4 mg by mouth daily as needed (infrequent urination).   Past Week   Testosterone 20.25 MG/1.25GM (1.62%) GEL Place 2 Pump onto the skin daily.   Past Week   metoprolol  tartrate (LOPRESSOR ) 50 MG tablet Take 1 tablet (50 mg total) by mouth once. Take 1 tablet by mouth 2 hours prior to CT scan. (Patient not taking: Reported on 06/10/2024) 1 tablet 0 Not Taking    Inpatient Medications:   aspirin  EC  81 mg Oral Daily   atorvastatin   20 mg Oral q morning   Chlorhexidine  Gluconate Cloth  6 each Topical Q0600   ferrous sulfate   325 mg Oral Daily   insulin  aspart  0-24 Units Subcutaneous TID AC & HS   sodium chloride  flush  3 mL Intravenous Q12H    Allergies: Allergies[1]  Family History  Problem Relation Age of Onset   Lupus Mother      Physical Exam: Vitals:   06/18/24 0715 06/18/24 0730 06/18/24 0745 06/18/24 0800  BP: (!) 121/102 106/69 (!) 136/49 (!) 116/50  Pulse: 68 67 (!) 58 (!) 57  Resp: 16 19 19 13   Temp:    98 F (36.7 C)  TempSrc:    Oral  SpO2: 96% 96% 96% 97%  Weight:      Height:        GEN- NAD, A&O x 3, normal affect HEENT: Normocephalic, atraumatic Lungs- CTAB, Normal effort.  Heart- brady / CHB, No M/G/R.  GI- Soft, NT, ND.  Extremities- No clubbing, cyanosis, or edema   Radiology/Studies: ECHOCARDIOGRAM LIMITED Result Date:  06/17/2024    ECHOCARDIOGRAM LIMITED REPORT   Patient Name:   [facility] Date of Exam: 06/17/2024 Medical Rec #:  [id number]      Height:       69.0 in Accession #:    [id number]     Weight:       177.0 lb Date of Birth:  August 10, 1940     BSA:          1.961 m Patient Age:    83 years       BP:           149/77 mmHg Patient Gender: M  HR:           70 bpm. Exam Location:  Inpatient Procedure: Limited Echo (Both Spectral and Color Flow Doppler were utilized            during procedure). Indications:     TAVR. Aortic stenosis.  History:         Patient has prior history of Echocardiogram examinations, most                  recent 02/18/2024. Arrythmias:RBBB; Risk Factors:Diabetes and                  Hypertension.                  Aortic Valve: 29 mm Sapien prosthetic, stented (TAVR) valve is                  present in the aortic position. Procedure Date: 06/17/24.  Sonographer:     Tinnie Barefoot RDCS Referring Phys:  [id number] [facility] JONETTA CASH Diagnosing Phys: Jerel Balding MD                   PRE-PROCEDURE FINDINGS                   Normal left ventricular systolic function and regional wall                  motion. Estimated LVEF 70%                  Trileaflet aortic valve with severe calcific stenosis.                   Aortic valve peak 75 gradient mm Hg, mean gradient 50 mm Hg,                  dimensionless index 0.33, calculated valve area 1.04 cm sq                  (0.53 cm sq/m sq BSA).                  Mild aortic insufficiency.                  Mild mitral insufficiency.                  No pericardial effusion.                   POST-PROCEDURE FINDINGS                   Hyperdynamicleft ventricular systolic function and normal                  regional wall motion. Estimated LVEF >75%                  Well deployed stent valve (TAVR).                   Aortic valve peak gradient 8 mm Hg, mean gradient 4 mm Hg,                  dimensionless index 1.0, calculated valve 2.60 area cm sq  (1.33                  cm sq/m sq BSA), acceleration time 67 ms.                  No  aortic insufficiency or perivalvular leak.                  Mild-moderate mitral insufficiency.                  No pericardial effusion. IMPRESSIONS  1. The left ventricle has no regional wall motion abnormalities. There is moderate concentric left ventricular hypertrophy. Left ventricular diastolic parameters are consistent with Grade I diastolic dysfunction (impaired relaxation).  2. Right ventricular systolic function is hyperdynamic. The right ventricular size is normal.  3. Trivial mitral valve regurgitation.  4. The aortic valve has been repaired/replaced. Aortic valve regurgitation is mild. There is a 29 mm Sapien prosthetic (TAVR) valve present in the aortic position. Procedure Date: 06/17/24. Aortic valve area, by VTI measures 3.09 cm. Aortic valve Vmax measures 1.40 m/s. Aortic valve acceleration time measures 67 msec. FINDINGS  Left Ventricle: The left ventricle has no regional wall motion abnormalities. The left ventricular internal cavity size was normal in size. There is moderate concentric left ventricular hypertrophy. Left ventricular diastolic parameters are consistent with Grade I diastolic dysfunction (impaired relaxation). Right Ventricle: The right ventricular size is normal. No increase in right ventricular wall thickness. Right ventricular systolic function is hyperdynamic. Left Atrium: Left atrial size was normal in size. Right Atrium: Right atrial size was normal in size. Pericardium: There is no evidence of pericardial effusion. Mitral Valve: Trivial mitral valve regurgitation. Tricuspid Valve: The tricuspid valve is not well visualized. Aortic Valve: The aortic valve has been repaired/replaced. Aortic valve regurgitation is mild. Aortic regurgitation PHT measures 571 msec. Aortic valve mean gradient measures 4.0 mmHg. Aortic valve peak gradient measures 7.8 mmHg. Aortic valve area, by VTI measures 3.09  cm. There is a 29 mm Sapien prosthetic, stented (TAVR) valve present in the aortic position. Procedure Date: 06/17/24. Pulmonic Valve: The pulmonic valve was not well visualized. Aorta: The aortic root and ascending aorta are structurally normal, with no evidence of dilitation. Additional Comments: Spectral Doppler performed. Color Doppler performed.  LEFT VENTRICLE PLAX 2D LVIDd:         4.00 cm LVIDs:         2.10 cm LV PW:         1.40 cm LV IVS:        1.20 cm LVOT diam:     1.90 cm LV SV:         75 LV SV Index:   38 LVOT Area:     2.84 cm  AORTIC VALVE AV Area (Vmax):    2.80 cm AV Area (Vmean):   2.29 cm AV Area (VTI):     3.09 cm AV Vmax:           140.00 cm/s AV Vmean:          91.300 cm/s AV VTI:            0.243 m AV Peak Grad:      7.8 mmHg AV Mean Grad:      4.0 mmHg LVOT Vmax:         138.09 cm/s LVOT Vmean:        73.811 cm/s LVOT VTI:          0.265 m LVOT/AV VTI ratio: 1.09 AI PHT:            571 msec  AORTA Ao Asc diam: 3.40 cm MITRAL VALVE                TRICUSPID VALVE MV Area (  PHT): 2.36 cm     TR Peak grad:   28.1 mmHg MV Decel Time: 322 msec     TR Vmax:        265.00 cm/s MV E velocity: 112.00 cm/s MV A velocity: 127.00 cm/s  SHUNTS MV E/A ratio:  0.88         Systemic VTI:  0.26 m                             Systemic Diam: 1.90 cm Jerel Balding MD Electronically signed by Jerel Balding MD Signature Date/Time: 06/17/2024/4:20:56 PM    Final    Structural Heart Procedure Result Date: 06/17/2024 See surgical note for result.  DG Chest 2 View Result Date: 06/12/2024 CLINICAL DATA:  Severe aortic stenosis.  Preop. EXAM: CHEST - 2 VIEW COMPARISON:  CT 05/26/2024 FINDINGS: Mild cardiomegaly. The cardiomediastinal contours are normal. Aortic atherosclerosis. Pulmonary vasculature is normal. No consolidation, pleural effusion, or pneumothorax. No acute osseous abnormalities are seen. IMPRESSION: No active cardiopulmonary disease. Electronically Signed   By: Andrea Gasman M.D.   On:  06/12/2024 17:40   CT CORONARY MORPH W/CTA COR W/SCORE W/CA W/CM &/OR WO/CM Addendum Date: 06/06/2024 ADDENDUM REPORT: 06/06/2024 22:07 EXAM: OVER-READ INTERPRETATION  CT CHEST The following report is an over-read performed by radiologist Dr. Franky Leff [facility] on 06/06/2024. This over-read does not include interpretation of cardiac or coronary anatomy or pathology. The coronary CTA interpretation by the cardiologist is attached. COMPARISON:  Comparison 01/24/2023. FINDINGS: Heart is normal size. Aorta normal caliber. Extensive aortic valve calcifications. Calcifications in the ascending thoracic aorta. No confluent airspace opacities or effusions. No acute findings in the upper abdomen. Chest wall soft tissues are unremarkable. No acute bony abnormality. IMPRESSION: No acute extra cardiac abnormality. Aortic atherosclerosis. Electronically Signed   By: Franky Crease M.D.   On: 06/06/2024 22:07   Result Date: 06/06/2024 CLINICAL DATA:  Severe Aortic Stenosis. EXAM: Cardiac TAVR CT TECHNIQUE: A non-contrast, gated CT scan was obtained with axial slices of 2.5 mm through the heart for aortic valve scoring. A 100 kV retrospective, gated, contrast cardiac scan was obtained. Gantry rotation speed was 230 msec and collimation was 0.63 mm. Nitroglycerin  was not given. The 3D dataset was reconstructed in systole with motion correction. The 3D data set was reconstructed in 5% intervals of the 0-95% of the R-R cycle. Systolic and diastolic phases were analyzed on a dedicated workstation using MPR, MIP, and VRT modes. The patient received 100 cc of contrast. FINDINGS: Image quality: Excellent. Noise artifact is: Limited. Valve Morphology: Tricuspid aortic valve with severe diffuse calcifications. Severely restricted leaflet motion in systole. Bulky calcification of the NCC/RCC. Aortic Valve Calcium  score: 5254 Aortic annular dimension: Phase assessed: 25% Annular area: 592 mm2 Annular perimeter: 88.2 mm  Max diameter: 32.3 mm Min diameter: 24.4 mm Annular and subannular calcification: Mild, single protruding calcification under the NCC extending into the LVOT. Mild, single, layered calcification under the LCC. Membranous septum length: 12.3 mm Optimal coplanar projection: LAO 9 CRA 3 Coronary Artery Height above Annulus: Left Main: 18.1 mm Right Coronary: 20.1 mm Sinus of Valsalva Measurements: Non-coronary: 38 mm Right-coronary: 37 mm Left-coronary: 38 mm Sinus of Valsalva Height: Non-coronary: 26.8 mm Right-coronary: 23.9 mm Left-coronary: 24.5 mm Sinotubular Junction: 30 mm Ascending Thoracic Aorta: 36 mm Coronary Arteries: Normal coronary origin. Right dominance. The study was performed without use of NTG and is insufficient for plaque evaluation. Please refer  to recent cardiac catheterization for coronary assessment. Cardiac Morphology: Right Atrium: Right atrial size is within normal limits. Right Ventricle: The right ventricular cavity is within normal limits. Left Atrium: Left atrial size is normal in size with no left atrial appendage filling defect. Left Ventricle: The ventricular cavity size is within normal limits. Pulmonary arteries: Normal in size without proximal filling defect. Pulmonary veins: Normal pulmonary venous drainage. Pericardium: Normal thickness with no significant effusion or calcium  present. Mitral Valve: The mitral valve is normal structure without significant calcification. Extra-cardiac findings: See attached radiology report for non-cardiac structures. IMPRESSION: 1. Annular measurements support a 29 mm S3 or 34 mm Evolut Pro. 2. Mild, single protruding calcification under the NCC extending into the LVOT. Mild, single, layered calcification under the LCC. 3. Sufficient coronary to annulus distance. 4. Optimal Fluoroscopic Angle for Delivery: LAO 9 CRA 3 Cromberg T. Barbaraann, MD Electronically Signed: By: Darryle Barbaraann M.D. On: 05/26/2024 18:57   CT ANGIO CHEST AORTA W/CM & OR  WO/CM Result Date: 05/27/2024 CLINICAL DATA:  84 year old male, preoperative evaluation prior to transarterial aortic valve replacement. EXAM: CT ANGIOGRAPHY CHEST, ABDOMEN AND PELVIS TECHNIQUE: Non-contrast CT of the chest was initially obtained. Multidetector CT imaging through the chest, abdomen and pelvis was performed using the standard protocol during bolus administration of intravenous contrast. Multiplanar reconstructed images and MIPs were obtained and reviewed to evaluate the vascular anatomy. RADIATION DOSE REDUCTION: This exam was performed according to the departmental dose-optimization program which includes automated exposure control, adjustment of the mA and/or kV according to patient size and/or use of iterative reconstruction technique. CONTRAST:  Ninety-five mL Omnipaque  350, intravenous COMPARISON:  01/24/2023, 06/15/2019, 07/21/2022 FINDINGS: CTA CHEST FINDINGS VASCULAR Preferential opacification of the thoracic aorta. No evidence of thoracic aortic aneurysm or dissection. Normal heart size. No pericardial effusion. Aortic valvular calcifications are present. Common origin of the brachiocephalic and left common carotid arteries. Scattered atherosclerotic calcifications about the aortic arch. Mild scattered coronary atherosclerotic calcifications. No evidence of central pulmonary embolism. NON VASCULAR Mediastinum/Nodes: No enlarged mediastinal, hilar, or axillary lymph nodes. Thyroid gland, trachea, and esophagus demonstrate no significant findings. Lungs/Pleura: No focal consolidations. No suspicious pulmonary nodules. No pleural effusion or pneumothorax. Musculoskeletal: No chest wall abnormality. No acute or significant osseous findings. Review of the MIP images confirms the above findings. CTA ABDOMEN AND PELVIS FINDINGS VASCULAR Aorta: Normal caliber aorta without aneurysm, dissection, vasculitis or significant stenosis. Scattered atherosclerotic calcifications. Celiac: Patent without  evidence of aneurysm, dissection, vasculitis or significant stenosis. SMA: Patent without evidence of aneurysm, dissection, vasculitis or significant stenosis. Renals: Dual right and single left renal arteries are patent without evidence of aneurysm, dissection, vasculitis, fibromuscular dysplasia or significant stenosis. IMA: Patent without evidence of aneurysm, dissection, vasculitis or significant stenosis. Inflow: Patent without evidence of aneurysm, dissection, vasculitis or significant stenosis. Veins: No obvious venous abnormality within the limitations of this arterial phase study. Review of the MIP images confirms the above findings. NON-VASCULAR Hepatobiliary: No focal liver abnormality is seen. Status post cholecystectomy. No biliary dilatation. Pancreas: Unchanged calcifications and atrophy of the pancreatic tail. No pancreatic ductal dilation. Spleen: Normal in size without focal abnormality. Adrenals/Urinary Tract: Adrenal glands are unremarkable. Kidneys are normal, without renal calculi, focal lesion, or hydronephrosis. Bladder is unremarkable. Stomach/Bowel: Stomach is within normal limits. Appendix appears normal. No evidence of bowel wall thickening, distention, or inflammatory changes. Lymphatic: No abdominopelvic lymphadenopathy. Reproductive: Normal size prostate with implantable radiopaque seeds. Other: No abdominal wall hernia or abnormality. No abdominopelvic ascites. Musculoskeletal:  No acute osseous abnormality. Postsurgical changes after L2-L4 PSIF without complicating features. Unchanged chronic compression fracture deformity of the L5 vertebral body. IMPRESSION: VASCULAR 1. Coronary and aortic atherosclerosis (ICD10-I70.0). 2. Aortic valvular calcifications. NON-VASCULAR 1. No acute intrathoracic or abdominopelvic abnormality. 2. Unchanged chronic appearing atrophy and calcifications of the pancreatic tail as could be seen as sequela of prior pancreatitis. Ester Sides, MD [facility] Electronically Signed   By: Ester Sides M.D.   On: 05/27/2024 15:34   CT ANGIO ABDOMEN PELVIS  W & WO CONTRAST Result Date: 05/27/2024 CLINICAL DATA:  84 year old male, preoperative evaluation prior to transarterial aortic valve replacement. EXAM: CT ANGIOGRAPHY CHEST, ABDOMEN AND PELVIS TECHNIQUE: Non-contrast CT of the chest was initially obtained. Multidetector CT imaging through the chest, abdomen and pelvis was performed using the standard protocol during bolus administration of intravenous contrast. Multiplanar reconstructed images and MIPs were obtained and reviewed to evaluate the vascular anatomy. RADIATION DOSE REDUCTION: This exam was performed according to the departmental dose-optimization program which includes automated exposure control, adjustment of the mA and/or kV according to patient size and/or use of iterative reconstruction technique. CONTRAST:  Ninety-five mL Omnipaque  350, intravenous COMPARISON:  01/24/2023, 06/15/2019, 07/21/2022 FINDINGS: CTA CHEST FINDINGS VASCULAR Preferential opacification of the thoracic aorta. No evidence of thoracic aortic aneurysm or dissection. Normal heart size. No pericardial effusion. Aortic valvular calcifications are present. Common origin of the brachiocephalic and left common carotid arteries. Scattered atherosclerotic calcifications about the aortic arch. Mild scattered coronary atherosclerotic calcifications. No evidence of central pulmonary embolism. NON VASCULAR Mediastinum/Nodes: No enlarged mediastinal, hilar, or axillary lymph nodes. Thyroid gland, trachea, and esophagus demonstrate no significant findings. Lungs/Pleura: No focal consolidations. No suspicious pulmonary nodules. No pleural effusion or pneumothorax. Musculoskeletal: No chest wall abnormality. No acute or significant osseous findings. Review of the MIP images confirms the above findings. CTA ABDOMEN AND PELVIS FINDINGS  VASCULAR Aorta: Normal caliber aorta without aneurysm, dissection, vasculitis or significant stenosis. Scattered atherosclerotic calcifications. Celiac: Patent without evidence of aneurysm, dissection, vasculitis or significant stenosis. SMA: Patent without evidence of aneurysm, dissection, vasculitis or significant stenosis. Renals: Dual right and single left renal arteries are patent without evidence of aneurysm, dissection, vasculitis, fibromuscular dysplasia or significant stenosis. IMA: Patent without evidence of aneurysm, dissection, vasculitis or significant stenosis. Inflow: Patent without evidence of aneurysm, dissection, vasculitis or significant stenosis. Veins: No obvious venous abnormality within the limitations of this arterial phase study. Review of the MIP images confirms the above findings. NON-VASCULAR Hepatobiliary: No focal liver abnormality is seen. Status post cholecystectomy. No biliary dilatation. Pancreas: Unchanged calcifications and atrophy of the pancreatic tail. No pancreatic ductal dilation. Spleen: Normal in size without focal abnormality. Adrenals/Urinary Tract: Adrenal glands are unremarkable. Kidneys are normal, without renal calculi, focal lesion, or hydronephrosis. Bladder is unremarkable. Stomach/Bowel: Stomach is within normal limits. Appendix appears normal. No evidence of bowel wall thickening, distention, or inflammatory changes. Lymphatic: No abdominopelvic lymphadenopathy. Reproductive: Normal size prostate with implantable radiopaque seeds. Other: No abdominal wall hernia or abnormality. No abdominopelvic ascites. Musculoskeletal: No acute osseous abnormality. Postsurgical changes after L2-L4 PSIF without complicating features. Unchanged chronic compression fracture deformity of the L5 vertebral body. IMPRESSION: VASCULAR 1. Coronary and aortic atherosclerosis (ICD10-I70.0). 2. Aortic valvular calcifications. NON-VASCULAR 1. No acute intrathoracic or abdominopelvic  abnormality. 2. Unchanged chronic appearing atrophy and calcifications of the pancreatic tail as could be seen as sequela of prior pancreatitis. Ester Sides, MD [facility] Electronically Signed  By: Ester Sides M.D.   On: 05/27/2024 15:34    EKG: (personally reviewed) 07/2021 NSR with RBBB  03/2024 NSR with RBBB 05/2024 NSR with RBBB  TELEMETRY: SR with CHB in 50's (personally reviewed)  DEVICE HISTORY: n/a  Assessment/Plan:  CHB s/p TAVR  Pre-existing RBBB  LVEF 70% -continue TVP for back up pacing, VVI 30 bpm, 10 mA  -threshold 3.5V  -given pre-existing RBBB and CHB post valve, will plan for PPM implant  -NPO for PPM implant  -procedure, risks / benefits discussed with patient and wife per Dr. Almetta who wish to proceed with implant       For questions or updates, please contact [facility] Please consult [email or web address] for contact info under     Signed, Daphne Barrack, NP-C, AGACNP-BC [facility] - Electrophysiology  06/18/2024, 11:13 AM      [1]  Allergies Allergen Reactions   Lisinopril Cough   "

## 2024-06-18 NOTE — Interval H&P Note (Signed)
 History and Physical Interval Note:  06/18/2024 12:27 PM  Benjamin Davila  has presented today for surgery, with the diagnosis of heart block.  The various methods of treatment have been discussed with the patient and family. After consideration of risks, benefits and other options for treatment, the patient has consented to  Procedures: PACEMAKER IMPLANT (N/A) as a surgical intervention.  The patient's history has been reviewed, patient examined, no change in status, stable for surgery.  I have reviewed the patient's chart and labs.  Questions were answered to the patient's satisfaction.     Benjamin Davila

## 2024-06-18 NOTE — Op Note (Addendum)
 "  Procedure:  LEFT sided Abbott Dual Chamber PPM Implant  Pre-op Diagnosis: CHB    Post-op Diagnosis: same  Procedure Date: 06/18/24  Attending: Adina Primus, MD   Anesthesia: MAC  Procedure Report:  The LEFT shoulder was prepped with chlorhexidine  scrub. 50 cc lidocaine  was injected at the planned incision site. A pocket was created with electrocautery and blunt dissection.    No venogram was performed. The LEFT axillary vein was accessed using standard access needle under ultrasound guidance. A wire was passed into the IVC. The access process was repeated once and a second wire was advanced into the IVC.  A 9 Fr sheath was advanced and the dilator removed. Through this short sheath, a Abbott Medium curve LBaP delivery sheath was advanced to the basal RV septum.   The RV lead was inserted into the sheath and the lead was then advanced to the basal RV septum.   The site for deployment was identified based on RAO and LAO fluoroscopy and response to pacing. With RV endocardial pacing, we observed a W pattern in V1. The helix was extended and the UltiPace lead was advanced into the RV septum with several clockwise turns. The QRS was 150 ms so the lead was retracted to assess for more optimum LBaP capture. The lead was positioned more inferior with similar result. The Abbott Medium curve LBaP delivery sheath was exchanged for a Long curve sheath and the lead was advanced into the septum again. There was adequate sensing and W pattern in V1. The LVAT was 80 ms as measured  from onset of stim to peak R wave in V6. The paced QRS duration was 150 ms.  The above findings were most consistent with Deep septal pacing. The sheath was split and removed and the RV lead was firmly attached to the pre-pectoralis fascia with tw 0-silk sutures.   The RA lead was advanced to the RA. A curved stylet was then used to affix the RA lead in the atrial septum. The active fixation screw was deployed, and  interrogation revealed a current of injury with acceptable lead parameters (see below). The peel away sheath was removed and the lead was affixed to the fascia using two 0-silk sutures.   The device was then connected to the leads. The pocket was irrigated with gentamicin  solution. The pocket was closed with continuous 2-0 V-Loc and 3-0 Stratafix. Dermabond was used to close the superficial layer. A vaso-occlusive dressing was placed.    The device was evaluated by the representative of the cardiac device manufacturer under the supervision of the attending physician with implant parameters listed below.   Complications: none Estimated blood loss: less than 50 mL  Implanted Hardware: Implant Name Type Inv. Item Serial No. Manufacturer Lot No. LRB No. Used Action  PACEMAKER ASSURITY DR-RF - [id number] Pacemaker PACEMAKER ASSURITY DR-RF [id number] [employer]  N/A 1 Implanted  LEAD ULTIPACE 52 OEJ8768/47 - [id number] Lead LEAD ULTIPACE 52 OEJ8768/47 [id number] [employer]  N/A 1 Implanted  LEAD ULTIPACE 65 LPA1231/65 - [id number] Lead LEAD ULTIPACE 65 LPA1231/65 [id number] [employer]  N/A 1 Implanted   Device Information: PPM: Abbott Assurity MRI DR-RF, SN [id number], DOI 06/18/24 RA:  Dail Lowes OEJ8768, SN [id number], DOI 06/18/24 RV/LBaP: Dail Lowes OEJ8768, SN [id number], DOI 06/18/24  Device Interrogation RA: 4.6 mV / 360 ohms / 0.375 V @ 0.5 ms RV : 3.7 mV / 450 ohms / 0.5 V @  0.5 ms  Summary: 1. Successful implant of a LEFT  sided Abbott DC/LBBAP PPM with Deep septal pacing  Recommendations: Routine post-procedure care with bedrest for 3 hours No heparin  (IV or subcutaneous) for 48 hours. No enoxaparin (IV or subcutaneous) for 7 days.  PA/lateral CXR in AM Wound check in AM Device interrogation in AM  Benjamin DELENA Primus, MD [facility]  Cardiac Electrophysiology "

## 2024-06-18 NOTE — Progress Notes (Addendum)
 " [facility]   MULTIDISCIPLINARY HEART VALVE TEAM  Patient Name: Benjamin Davila Date of Encounter: 06/18/2024  Admit date: 06/17/2024   PCP:  Jerrell Cleatus Ned, MD  [facility] Cardiologist:  Jennifer JONELLE Crape, MD  [facility] Structural heart: Lonni Cash, MD [facility] Electrophysiologist:  None   Hospital Problem List     Principal Problem:   S/P TAVR (transcatheter aortic valve replacement) Active Problems:   Malignant neoplasm of prostate (HCC)   Type 2 diabetes mellitus with diabetic neuropathy, unspecified (HCC)   Spinal stenosis of lumbar region   Iron deficiency anemia, unspecified   Essential hypertension   Severe aortic stenosis   RBBB   Subjective   No complaints. Several questions about pacemaker.   Inpatient Medications    Scheduled Meds:  aspirin  EC  81 mg Oral Daily   atorvastatin   20 mg Oral q morning   Chlorhexidine  Gluconate Cloth  6 each Topical Q0600   ferrous sulfate   325 mg Oral Daily   insulin  aspart  0-24 Units Subcutaneous TID AC & HS   sodium chloride  flush  3 mL Intravenous Q12H   Continuous Infusions:  sodium chloride      nitroGLYCERIN      norepinephrine  (LEVOPHED ) Adult infusion 3 mcg/min (06/18/24 1000)   PRN Meds: sodium chloride , acetaminophen  **OR** acetaminophen , diazepam , morphine  injection, ondansetron  (ZOFRAN ) IV, mouth rinse, oxyCODONE , sodium chloride  flush, tamsulosin , traMADol    Vital Signs    Vitals:   06/18/24 1000 06/18/24 1015 06/18/24 1030 06/18/24 1100  BP: (!) 119/48 (!) 114/45 (!) 114/47   Pulse: (!) 53 (!) 53 (!) 53   Resp: 12 19 18    Temp:    98.4 F (36.9 C)  TempSrc:    Oral  SpO2: 97% 92% (!) 89%   Weight:      Height:        Intake/Output Summary (Last 24 hours) at 06/18/2024 1127 Last data filed at 06/18/2024 1000 Gross per 24 hour  Intake 1962.78 ml  Output 720 ml  Net 1242.78 ml   Filed Weights   06/17/24 0825 06/18/24 0500  Weight: 80.3 kg 84.9 kg     Physical Exam    GEN: Well nourished, well developed, in no acute distress.  HEENT: Grossly normal.  Neck: Supple, no JVD or masses. Right internal jugular temp wire in place. Cardiac: RRR, 1/6 SEM @ RUSB. No rubs, or gallops. No clubbing, cyanosis, edema.   Respiratory:  Respirations regular and unlabored, clear to auscultation bilaterally. GI: Soft, nontender, nondistended, BS + x 4. MS: no deformity or atrophy. Skin: warm and dry, no rash.  Groin sites clear without hematoma or ecchymosis  Neuro:  Strength and sensation are intact. Psych: AAOx3.  Normal affect.  Labs    CBC Recent Labs    06/18/24 0903  WBC 6.2  HGB 10.6*  HCT 31.3*  MCV 94.6  PLT 146*   Basic Metabolic Panel: Recent Labs  Lab 06/12/24 1340 06/18/24 0903  NA 140 137  K 4.3 4.1  CL 103 102  CO2 26 27  GLUCOSE 104* 168*  BUN 14 14  CREATININE 0.87 0.93  CALCIUM  9.8 8.9    Estimated Creatinine Clearance: 65 mL/min (by C-G formula based on SCr of 0.93 mg/dL). Recent Labs  Lab 06/12/24 1340 06/18/24 0903  WBC 4.5 6.2     Telemetry    Pacing at 70 bpm - Personally Reviewed  ECG    RBBB with CHB, HR 49  - Personally  Reviewed  Patient Profile     Benjamin Davila is a 84 y.o. male with a history of prostate cancer, HTN, T2DM on insulin , RBBB, CAD, and severe AS who presented to [facility] on 06/17/24 for planned TAVR.   Assessment & Plan    Severe AS:  -- S/p TAVR with a 29 mm Edwards Sapien 3 Ultra Resilia THV via the TF approach on 06/17/24.  -- Post operative echo completed but pending formal read. -- Groin sites are stable. -- Continue Asprin 81mg  daily.  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  -- Appreciate EP assistance who plans for PPM today.  -- NPO.    Acute HFpEF: -- LVEDP 25 mm hg at the time of TAVR.  -- Will hold off on diuresis until after PPM.   HTN: -- Had hypotension post operatively requiring levophed .  -- Will wean as tolerated.   Urinary  retention: -- Foley placed at the pts request.  -- Continue tamsulosin  0.4mg  daily.  -- Will need voiding trial prior to discharge.    DMT2:  -- Continue SSI.  SignedLamarr Hummer, PA-C  06/18/2024, 11:27 AM  Pager [phone]  I have personally seen and examined this patient. I agree with the assessment and plan as outlined above.  Tele with CHB overnight. C/o headache and nausea today.  BP stable on low dose Levophed .  My exam; NAD  RRR LUngs clear Bilateral groins without hematoma Plan: Planning for permanent pacemaker today. Appreciate input of the EP team.   Lonni Cash, MD, [facility] 06/18/2024 11:56 AM   "

## 2024-06-18 NOTE — Progress Notes (Signed)
 Echocardiogram 2D Echocardiogram has been performed.  Benjamin Davila 06/18/2024, 8:50 AM

## 2024-06-18 NOTE — Consult Note (Signed)
 "   ELECTROPHYSIOLOGY CONSULT NOTE    Patient ID: Benjamin Davila MRN: [id number], DOB/AGE: Sep 26, 1940 84 y.o.  Admit date: 06/17/2024 Date of Consult: 06/18/2024  Primary Physician: Benjamin Cleatus Ned, MD Primary Cardiologist: Benjamin JONELLE Crape, MD  Electrophysiologist: Dr. Almetta   Referring Provider: Dr. Verlin  Patient Profile: Benjamin Davila is a 84 y.o. male with a history of RBBB, HTN, severe AS, CAD, DM who is being seen today for the evaluation of complete heart block at the request of Dr. Verlin.  HPI:  Benjamin Davila is a 84 y.o. male who was admitted for planned TAVR.    On 06/17/24 he underwent placement of Edwards Sapien 3 THV - 29 mm. He had no paravalvular leak and normal LV function on ECHO. Pre-procedure EKG review shows RBBB. 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  Post-procedure labs > Na 137, K 4.1, Cr 0.93, WBC 6.2, Hgb 10.6, and platelets 146.   He reports short term memory issues and nausea this am. States his wife keeps up with his schedule.   He denies chest pain, palpitations, dyspnea, PND, orthopnea, vomiting, dizziness, syncope, edema, weight gain, or early satiety.   Labs       PLT  146* (01/29 0903) HGB  10.6* (01/29 0903) WBC 6.2 (01/29 0903)  .    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 Verlin and Dr. 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   Type 2 diabetes mellitus with diabetic neuropathy, unspecified (HCC)  07/12/2020     Surgical History:  Past Surgical History:  Procedure Laterality Date   BACK SURGERY     lumbar surgery 20 years ago per patient   CATARACT EXTRACTION     CHOLECYSTECTOMY     20 years ago per patient   COLONOSCOPY     INTRAOPERATIVE TRANSTHORACIC ECHOCARDIOGRAM N/A 06/17/2024   Procedure: ECHOCARDIOGRAM, TRANSTHORACIC;  Surgeon: Benjamin Davila Davila BIRCH, MD;  Location: [facility];  Service: Cardiovascular;  Laterality: N/A;   LAMINECTOMY WITH POSTERIOR LATERAL ARTHRODESIS LEVEL 2 Left 03/27/2023   Procedure: Left Lumbar Two-Three, Lumbar Three-Four Hemifacetectomy, posterolateral fusion Lumbar Two-Four, segmental fixation Lumbar Two-Four;  Surgeon: Benjamin Alm Hamilton, MD;  Location: [facility] OR;  Service: Neurosurgery;  Laterality: Left;   LUMBAR LAMINECTOMY/DECOMPRESSION MICRODISCECTOMY Bilateral 08/09/2021   Procedure: Lumbar decompressive laminectomy  - Lumbar two-Lumabr three - Lumbar three-Lumbar four - Lumbar four-Lumbar five - Lumabr five-Sacal one - Bilateral, Posterior Lateral Fusion Lumbar three-four-Right;  Surgeon: Benjamin Alm RAMAN, MD;  [facility];  Service: Neurosurgery;  Laterality: Bilateral;   RIGHT/LEFT HEART CATH AND CORONARY ANGIOGRAPHY N/A 04/23/2024   Procedure: RIGHT/LEFT HEART CATH AND CORONARY ANGIOGRAPHY;  Surgeon: Benjamin Lonni, MD;  Location: [facility];  Service: Cardiovascular;  Laterality: N/A;   SPINE SURGERY     TONSILLECTOMY     as a kid     Medications Prior to Admission  Medication Sig Dispense Refill Last  Dose/Taking   acetaminophen  (TYLENOL ) 325 MG tablet Take 650 mg by mouth daily as needed (pain).   Past Week   aspirin  EC 81 MG tablet Take 1 tablet (81 mg total) by mouth daily. Swallow whole.   06/16/2024   atorvastatin  (LIPITOR) 20 MG tablet Take 1 tablet (20 mg total) by mouth every morning. 90 tablet 3 06/16/2024   diazepam  (VALIUM ) 5 MG tablet Take 1 tablet (5 mg total) by mouth at bedtime as needed for anxiety  (moods,and muscle spasm). 30 tablet 0 Past Week   ferrous sulfate  325 (65 FE) MG tablet Take 325 mg by mouth daily.   Past Week   insulin  glargine (LANTUS  SOLOSTAR) 100 UNIT/ML Solostar Pen Inject 15 Units into the skin daily before breakfast.   06/16/2024   KRILL OIL PO Take 1 capsule by mouth daily.   Past Week   losartan  (COZAAR ) 100 MG tablet Take 1 tablet (100 mg total) by mouth daily. 90 tablet 3 06/16/2024   metFORMIN  (GLUCOPHAGE ) 500 MG tablet Take 1,000 mg by mouth 2 (two) times a day.   06/14/2024   Multiple Vitamin (MULTIVITAMIN ADULT PO) Take 1 tablet by mouth daily.   Past Week   tamsulosin  (FLOMAX ) 0.4 MG CAPS capsule Take 0.4 mg by mouth daily as needed (infrequent urination).   Past Week   Testosterone 20.25 MG/1.25GM (1.62%) GEL Place 2 Pump onto the skin daily.   Past Week   metoprolol  tartrate (LOPRESSOR ) 50 MG tablet Take 1 tablet (50 mg total) by mouth once. Take 1 tablet by mouth 2 hours prior to CT scan. (Patient not taking: Reported on 06/10/2024) 1 tablet 0 Not Taking    Inpatient Medications:   aspirin  EC  81 mg Oral Daily   atorvastatin   20 mg Oral q morning   Chlorhexidine  Gluconate Cloth  6 each Topical Q0600   ferrous sulfate   325 mg Oral Daily   insulin  aspart  0-24 Units Subcutaneous TID AC & HS   sodium chloride  flush  3 mL Intravenous Q12H    Allergies: Allergies[1]  Family History  Problem Relation Age of Onset   Lupus Mother      Physical Exam: Vitals:   06/18/24 0715 06/18/24 0730 06/18/24 0745 06/18/24 0800  BP: (!) 121/102 106/69 (!) 136/49 (!) 116/50  Pulse: 68 67 (!) 58 (!) 57  Resp: 16 19 19 13   Temp:    98 F (36.7 C)  TempSrc:    Oral  SpO2: 96% 96% 96% 97%  Weight:      Height:        GEN- NAD, A&O x 3, normal affect HEENT: Normocephalic, atraumatic Lungs- CTAB, Normal effort.  Heart- brady / CHB, No M/G/R.  GI- Soft, NT, ND.  Extremities- No clubbing, cyanosis, or edema   Radiology/Studies: ECHOCARDIOGRAM LIMITED Result Date:  06/17/2024    ECHOCARDIOGRAM LIMITED REPORT   Patient Name:   Benjamin Davila Date of Exam: 06/17/2024 Medical Rec #:  [id number]      Height:       69.0 in Accession #:    [id number]     Weight:       177.0 lb Date of Birth:  August 10, 1940     BSA:          1.961 m Patient Age:    83 years       BP:           149/77 mmHg Patient Gender: M  HR:           70 bpm. Exam Location:  Inpatient Procedure: Limited Echo (Both Spectral and Color Flow Doppler were utilized            during procedure). Indications:     TAVR. Aortic stenosis.  History:         Patient has prior history of Echocardiogram examinations, most                  recent 02/18/2024. Arrythmias:RBBB; Risk Factors:Diabetes and                  Hypertension.                  Aortic Valve: 29 mm Sapien prosthetic, stented (TAVR) valve is                  present in the aortic position. Procedure Date: 06/17/24.  Sonographer:     Tinnie Barefoot RDCS Referring Phys:  [id number] Davila JONETTA CASH Diagnosing Phys: Jerel Balding MD                   PRE-PROCEDURE FINDINGS                   Normal left ventricular systolic function and regional wall                  motion. Estimated LVEF 70%                  Trileaflet aortic valve with severe calcific stenosis.                   Aortic valve peak 75 gradient mm Hg, mean gradient 50 mm Hg,                  dimensionless index 0.33, calculated valve area 1.04 cm sq                  (0.53 cm sq/m sq BSA).                  Mild aortic insufficiency.                  Mild mitral insufficiency.                  No pericardial effusion.                   POST-PROCEDURE FINDINGS                   Hyperdynamicleft ventricular systolic function and normal                  regional wall motion. Estimated LVEF >75%                  Well deployed stent valve (TAVR).                   Aortic valve peak gradient 8 mm Hg, mean gradient 4 mm Hg,                  dimensionless index 1.0, calculated valve 2.60 area cm sq  (1.33                  cm sq/m sq BSA), acceleration time 67 ms.                  No  aortic insufficiency or perivalvular leak.                  Mild-moderate mitral insufficiency.                  No pericardial effusion. IMPRESSIONS  1. The left ventricle has no regional wall motion abnormalities. There is moderate concentric left ventricular hypertrophy. Left ventricular diastolic parameters are consistent with Grade I diastolic dysfunction (impaired relaxation).  2. Right ventricular systolic function is hyperdynamic. The right ventricular size is normal.  3. Trivial mitral valve regurgitation.  4. The aortic valve has been repaired/replaced. Aortic valve regurgitation is mild. There is a 29 mm Sapien prosthetic (TAVR) valve present in the aortic position. Procedure Date: 06/17/24. Aortic valve area, by VTI measures 3.09 cm. Aortic valve Vmax measures 1.40 m/s. Aortic valve acceleration time measures 67 msec. FINDINGS  Left Ventricle: The left ventricle has no regional wall motion abnormalities. The left ventricular internal cavity size was normal in size. There is moderate concentric left ventricular hypertrophy. Left ventricular diastolic parameters are consistent with Grade I diastolic dysfunction (impaired relaxation). Right Ventricle: The right ventricular size is normal. No increase in right ventricular wall thickness. Right ventricular systolic function is hyperdynamic. Left Atrium: Left atrial size was normal in size. Right Atrium: Right atrial size was normal in size. Pericardium: There is no evidence of pericardial effusion. Mitral Valve: Trivial mitral valve regurgitation. Tricuspid Valve: The tricuspid valve is not well visualized. Aortic Valve: The aortic valve has been repaired/replaced. Aortic valve regurgitation is mild. Aortic regurgitation PHT measures 571 msec. Aortic valve mean gradient measures 4.0 mmHg. Aortic valve peak gradient measures 7.8 mmHg. Aortic valve area, by VTI measures 3.09  cm. There is a 29 mm Sapien prosthetic, stented (TAVR) valve present in the aortic position. Procedure Date: 06/17/24. Pulmonic Valve: The pulmonic valve was not well visualized. Aorta: The aortic root and ascending aorta are structurally normal, with no evidence of dilitation. Additional Comments: Spectral Doppler performed. Color Doppler performed.  LEFT VENTRICLE PLAX 2D LVIDd:         4.00 cm LVIDs:         2.10 cm LV PW:         1.40 cm LV IVS:        1.20 cm LVOT diam:     1.90 cm LV SV:         75 LV SV Index:   38 LVOT Area:     2.84 cm  AORTIC VALVE AV Area (Vmax):    2.80 cm AV Area (Vmean):   2.29 cm AV Area (VTI):     3.09 cm AV Vmax:           140.00 cm/s AV Vmean:          91.300 cm/s AV VTI:            0.243 m AV Peak Grad:      7.8 mmHg AV Mean Grad:      4.0 mmHg LVOT Vmax:         138.09 cm/s LVOT Vmean:        73.811 cm/s LVOT VTI:          0.265 m LVOT/AV VTI ratio: 1.09 AI PHT:            571 msec  AORTA Ao Asc diam: 3.40 cm MITRAL VALVE                TRICUSPID VALVE MV Area (  PHT): 2.36 cm     TR Peak grad:   28.1 mmHg MV Decel Time: 322 msec     TR Vmax:        265.00 cm/s MV E velocity: 112.00 cm/s MV A velocity: 127.00 cm/s  SHUNTS MV E/A ratio:  0.88         Systemic VTI:  0.26 m                             Systemic Diam: 1.90 cm Jerel Balding MD Electronically signed by Jerel Balding MD Signature Date/Time: 06/17/2024/4:20:56 PM    Final    Structural Heart Procedure Result Date: 06/17/2024 See surgical note for result.  DG Chest 2 View Result Date: 06/12/2024 CLINICAL DATA:  Severe aortic stenosis.  Preop. EXAM: CHEST - 2 VIEW COMPARISON:  CT 05/26/2024 FINDINGS: Mild cardiomegaly. The cardiomediastinal contours are normal. Aortic atherosclerosis. Pulmonary vasculature is normal. No consolidation, pleural effusion, or pneumothorax. No acute osseous abnormalities are seen. IMPRESSION: No active cardiopulmonary disease. Electronically Signed   By: Andrea Gasman M.D.   On:  06/12/2024 17:40   CT CORONARY MORPH W/CTA COR W/SCORE W/CA W/CM &/OR WO/CM Addendum Date: 06/06/2024 ADDENDUM REPORT: 06/06/2024 22:07 EXAM: OVER-READ INTERPRETATION  CT CHEST The following report is an over-read performed by radiologist Dr. Franky Leff [facility] on 06/06/2024. This over-read does not include interpretation of cardiac or coronary anatomy or pathology. The coronary CTA interpretation by the cardiologist is attached. COMPARISON:  Comparison 01/24/2023. FINDINGS: Heart is normal size. Aorta normal caliber. Extensive aortic valve calcifications. Calcifications in the ascending thoracic aorta. No confluent airspace opacities or effusions. No acute findings in the upper abdomen. Chest wall soft tissues are unremarkable. No acute bony abnormality. IMPRESSION: No acute extra cardiac abnormality. Aortic atherosclerosis. Electronically Signed   By: Franky Crease M.D.   On: 06/06/2024 22:07   Result Date: 06/06/2024 CLINICAL DATA:  Severe Aortic Stenosis. EXAM: Cardiac TAVR CT TECHNIQUE: A non-contrast, gated CT scan was obtained with axial slices of 2.5 mm through the heart for aortic valve scoring. A 100 kV retrospective, gated, contrast cardiac scan was obtained. Gantry rotation speed was 230 msec and collimation was 0.63 mm. Nitroglycerin  was not given. The 3D dataset was reconstructed in systole with motion correction. The 3D data set was reconstructed in 5% intervals of the 0-95% of the R-R cycle. Systolic and diastolic phases were analyzed on a dedicated workstation using MPR, MIP, and VRT modes. The patient received 100 cc of contrast. FINDINGS: Image quality: Excellent. Noise artifact is: Limited. Valve Morphology: Tricuspid aortic valve with severe diffuse calcifications. Severely restricted leaflet motion in systole. Bulky calcification of the NCC/RCC. Aortic Valve Calcium  score: 5254 Aortic annular dimension: Phase assessed: 25% Annular area: 592 mm2 Annular perimeter: 88.2 mm  Max diameter: 32.3 mm Min diameter: 24.4 mm Annular and subannular calcification: Mild, single protruding calcification under the NCC extending into the LVOT. Mild, single, layered calcification under the LCC. Membranous septum length: 12.3 mm Optimal coplanar projection: LAO 9 CRA 3 Coronary Artery Height above Annulus: Left Main: 18.1 mm Right Coronary: 20.1 mm Sinus of Valsalva Measurements: Non-coronary: 38 mm Right-coronary: 37 mm Left-coronary: 38 mm Sinus of Valsalva Height: Non-coronary: 26.8 mm Right-coronary: 23.9 mm Left-coronary: 24.5 mm Sinotubular Junction: 30 mm Ascending Thoracic Aorta: 36 mm Coronary Arteries: Normal coronary origin. Right dominance. The study was performed without use of NTG and is insufficient for plaque evaluation. Please refer  to recent cardiac catheterization for coronary assessment. Cardiac Morphology: Right Atrium: Right atrial size is within normal limits. Right Ventricle: The right ventricular cavity is within normal limits. Left Atrium: Left atrial size is normal in size with no left atrial appendage filling defect. Left Ventricle: The ventricular cavity size is within normal limits. Pulmonary arteries: Normal in size without proximal filling defect. Pulmonary veins: Normal pulmonary venous drainage. Pericardium: Normal thickness with no significant effusion or calcium  present. Mitral Valve: The mitral valve is normal structure without significant calcification. Extra-cardiac findings: See attached radiology report for non-cardiac structures. IMPRESSION: 1. Annular measurements support a 29 mm S3 or 34 mm Evolut Pro. 2. Mild, single protruding calcification under the NCC extending into the LVOT. Mild, single, layered calcification under the LCC. 3. Sufficient coronary to annulus distance. 4. Optimal Fluoroscopic Angle for Delivery: LAO 9 CRA 3 Cromberg T. Barbaraann, MD Electronically Signed: By: Darryle Barbaraann M.D. On: 05/26/2024 18:57   CT ANGIO CHEST AORTA W/CM & OR  WO/CM Result Date: 05/27/2024 CLINICAL DATA:  84 year old male, preoperative evaluation prior to transarterial aortic valve replacement. EXAM: CT ANGIOGRAPHY CHEST, ABDOMEN AND PELVIS TECHNIQUE: Non-contrast CT of the chest was initially obtained. Multidetector CT imaging through the chest, abdomen and pelvis was performed using the standard protocol during bolus administration of intravenous contrast. Multiplanar reconstructed images and MIPs were obtained and reviewed to evaluate the vascular anatomy. RADIATION DOSE REDUCTION: This exam was performed according to the departmental dose-optimization program which includes automated exposure control, adjustment of the mA and/or kV according to patient size and/or use of iterative reconstruction technique. CONTRAST:  Ninety-five mL Omnipaque  350, intravenous COMPARISON:  01/24/2023, 06/15/2019, 07/21/2022 FINDINGS: CTA CHEST FINDINGS VASCULAR Preferential opacification of the thoracic aorta. No evidence of thoracic aortic aneurysm or dissection. Normal heart size. No pericardial effusion. Aortic valvular calcifications are present. Common origin of the brachiocephalic and left common carotid arteries. Scattered atherosclerotic calcifications about the aortic arch. Mild scattered coronary atherosclerotic calcifications. No evidence of central pulmonary embolism. NON VASCULAR Mediastinum/Nodes: No enlarged mediastinal, hilar, or axillary lymph nodes. Thyroid gland, trachea, and esophagus demonstrate no significant findings. Lungs/Pleura: No focal consolidations. No suspicious pulmonary nodules. No pleural effusion or pneumothorax. Musculoskeletal: No chest wall abnormality. No acute or significant osseous findings. Review of the MIP images confirms the above findings. CTA ABDOMEN AND PELVIS FINDINGS VASCULAR Aorta: Normal caliber aorta without aneurysm, dissection, vasculitis or significant stenosis. Scattered atherosclerotic calcifications. Celiac: Patent without  evidence of aneurysm, dissection, vasculitis or significant stenosis. SMA: Patent without evidence of aneurysm, dissection, vasculitis or significant stenosis. Renals: Dual right and single left renal arteries are patent without evidence of aneurysm, dissection, vasculitis, fibromuscular dysplasia or significant stenosis. IMA: Patent without evidence of aneurysm, dissection, vasculitis or significant stenosis. Inflow: Patent without evidence of aneurysm, dissection, vasculitis or significant stenosis. Veins: No obvious venous abnormality within the limitations of this arterial phase study. Review of the MIP images confirms the above findings. NON-VASCULAR Hepatobiliary: No focal liver abnormality is seen. Status post cholecystectomy. No biliary dilatation. Pancreas: Unchanged calcifications and atrophy of the pancreatic tail. No pancreatic ductal dilation. Spleen: Normal in size without focal abnormality. Adrenals/Urinary Tract: Adrenal glands are unremarkable. Kidneys are normal, without renal calculi, focal lesion, or hydronephrosis. Bladder is unremarkable. Stomach/Bowel: Stomach is within normal limits. Appendix appears normal. No evidence of bowel wall thickening, distention, or inflammatory changes. Lymphatic: No abdominopelvic lymphadenopathy. Reproductive: Normal size prostate with implantable radiopaque seeds. Other: No abdominal wall hernia or abnormality. No abdominopelvic ascites. Musculoskeletal:  No acute osseous abnormality. Postsurgical changes after L2-L4 PSIF without complicating features. Unchanged chronic compression fracture deformity of the L5 vertebral body. IMPRESSION: VASCULAR 1. Coronary and aortic atherosclerosis (ICD10-I70.0). 2. Aortic valvular calcifications. NON-VASCULAR 1. No acute intrathoracic or abdominopelvic abnormality. 2. Unchanged chronic appearing atrophy and calcifications of the pancreatic tail as could be seen as sequela of prior pancreatitis. Ester Sides, MD [facility] Electronically Signed   By: Ester Sides M.D.   On: 05/27/2024 15:34   CT ANGIO ABDOMEN PELVIS  W & WO CONTRAST Result Date: 05/27/2024 CLINICAL DATA:  84 year old male, preoperative evaluation prior to transarterial aortic valve replacement. EXAM: CT ANGIOGRAPHY CHEST, ABDOMEN AND PELVIS TECHNIQUE: Non-contrast CT of the chest was initially obtained. Multidetector CT imaging through the chest, abdomen and pelvis was performed using the standard protocol during bolus administration of intravenous contrast. Multiplanar reconstructed images and MIPs were obtained and reviewed to evaluate the vascular anatomy. RADIATION DOSE REDUCTION: This exam was performed according to the departmental dose-optimization program which includes automated exposure control, adjustment of the mA and/or kV according to patient size and/or use of iterative reconstruction technique. CONTRAST:  Ninety-five mL Omnipaque  350, intravenous COMPARISON:  01/24/2023, 06/15/2019, 07/21/2022 FINDINGS: CTA CHEST FINDINGS VASCULAR Preferential opacification of the thoracic aorta. No evidence of thoracic aortic aneurysm or dissection. Normal heart size. No pericardial effusion. Aortic valvular calcifications are present. Common origin of the brachiocephalic and left common carotid arteries. Scattered atherosclerotic calcifications about the aortic arch. Mild scattered coronary atherosclerotic calcifications. No evidence of central pulmonary embolism. NON VASCULAR Mediastinum/Nodes: No enlarged mediastinal, hilar, or axillary lymph nodes. Thyroid gland, trachea, and esophagus demonstrate no significant findings. Lungs/Pleura: No focal consolidations. No suspicious pulmonary nodules. No pleural effusion or pneumothorax. Musculoskeletal: No chest wall abnormality. No acute or significant osseous findings. Review of the MIP images confirms the above findings. CTA ABDOMEN AND PELVIS FINDINGS  VASCULAR Aorta: Normal caliber aorta without aneurysm, dissection, vasculitis or significant stenosis. Scattered atherosclerotic calcifications. Celiac: Patent without evidence of aneurysm, dissection, vasculitis or significant stenosis. SMA: Patent without evidence of aneurysm, dissection, vasculitis or significant stenosis. Renals: Dual right and single left renal arteries are patent without evidence of aneurysm, dissection, vasculitis, fibromuscular dysplasia or significant stenosis. IMA: Patent without evidence of aneurysm, dissection, vasculitis or significant stenosis. Inflow: Patent without evidence of aneurysm, dissection, vasculitis or significant stenosis. Veins: No obvious venous abnormality within the limitations of this arterial phase study. Review of the MIP images confirms the above findings. NON-VASCULAR Hepatobiliary: No focal liver abnormality is seen. Status post cholecystectomy. No biliary dilatation. Pancreas: Unchanged calcifications and atrophy of the pancreatic tail. No pancreatic ductal dilation. Spleen: Normal in size without focal abnormality. Adrenals/Urinary Tract: Adrenal glands are unremarkable. Kidneys are normal, without renal calculi, focal lesion, or hydronephrosis. Bladder is unremarkable. Stomach/Bowel: Stomach is within normal limits. Appendix appears normal. No evidence of bowel wall thickening, distention, or inflammatory changes. Lymphatic: No abdominopelvic lymphadenopathy. Reproductive: Normal size prostate with implantable radiopaque seeds. Other: No abdominal wall hernia or abnormality. No abdominopelvic ascites. Musculoskeletal: No acute osseous abnormality. Postsurgical changes after L2-L4 PSIF without complicating features. Unchanged chronic compression fracture deformity of the L5 vertebral body. IMPRESSION: VASCULAR 1. Coronary and aortic atherosclerosis (ICD10-I70.0). 2. Aortic valvular calcifications. NON-VASCULAR 1. No acute intrathoracic or abdominopelvic  abnormality. 2. Unchanged chronic appearing atrophy and calcifications of the pancreatic tail as could be seen as sequela of prior pancreatitis. Ester Sides, MD [facility] Electronically Signed  By: Ester Sides M.D.   On: 05/27/2024 15:34    EKG: (personally reviewed) 07/2021 NSR with RBBB  03/2024 NSR with RBBB 05/2024 NSR with RBBB  TELEMETRY: SR with CHB in 50's (personally reviewed)  DEVICE HISTORY: n/a  Assessment/Plan:  CHB s/p TAVR  Pre-existing RBBB  LVEF 70% -continue TVP for back up pacing, VVI 30 bpm, 10 mA  -threshold 3.5V  -given pre-existing RBBB and CHB post valve, will plan for PPM implant  -NPO for PPM implant  -procedure, risks / benefits discussed with patient and wife per Dr. Almetta who wish to proceed with implant       For questions or updates, please contact [facility] Please consult [email or web address] for contact info under     Signed, Daphne Barrack, NP-C, AGACNP-BC [facility] - Electrophysiology  06/18/2024, 11:13 AM      [1]  Allergies Allergen Reactions   Lisinopril Cough   "

## 2024-06-19 ENCOUNTER — Telehealth: Payer: Self-pay | Admitting: Cardiovascular Disease

## 2024-06-19 ENCOUNTER — Encounter (HOSPITAL_COMMUNITY): Payer: Self-pay | Admitting: Cardiovascular Disease

## 2024-06-19 ENCOUNTER — Inpatient Hospital Stay (HOSPITAL_COMMUNITY)

## 2024-06-19 LAB — CBC
HCT: 29 % — ABNORMAL LOW (ref 39.0–52.0)
Hemoglobin: 10 g/dL — ABNORMAL LOW (ref 13.0–17.0)
MCH: 32.4 pg (ref 26.0–34.0)
MCHC: 34.5 g/dL (ref 30.0–36.0)
MCV: 93.9 fL (ref 80.0–100.0)
Platelets: 108 10*3/uL — ABNORMAL LOW (ref 150–400)
RBC: 3.09 MIL/uL — ABNORMAL LOW (ref 4.22–5.81)
RDW: 12.3 % (ref 11.5–15.5)
WBC: 5.1 10*3/uL (ref 4.0–10.5)
nRBC: 0 % (ref 0.0–0.2)

## 2024-06-19 LAB — BASIC METABOLIC PANEL WITH GFR
Anion gap: 7 (ref 5–15)
BUN: 14 mg/dL (ref 8–23)
CO2: 28 mmol/L (ref 22–32)
Calcium: 8.5 mg/dL — ABNORMAL LOW (ref 8.9–10.3)
Chloride: 104 mmol/L (ref 98–111)
Creatinine, Ser: 0.89 mg/dL (ref 0.61–1.24)
GFR, Estimated: >60 mL/min
Glucose, Bld: 128 mg/dL — ABNORMAL HIGH (ref 70–99)
Potassium: 3.7 mmol/L (ref 3.5–5.1)
Sodium: 139 mmol/L (ref 135–145)

## 2024-06-19 LAB — SURGICAL PCR SCREEN
MRSA, PCR: NEGATIVE
Staphylococcus aureus: POSITIVE — AB

## 2024-06-19 LAB — GLUCOSE, CAPILLARY
Glucose-Capillary: 141 mg/dL — ABNORMAL HIGH (ref 70–99)
Glucose-Capillary: 153 mg/dL — ABNORMAL HIGH (ref 70–99)

## 2024-06-19 MED ORDER — FUROSEMIDE 10 MG/ML IJ SOLN
40.0000 mg | Freq: Once | INTRAMUSCULAR | Status: AC
  Administered 2024-06-19: 40 mg via INTRAVENOUS
  Filled 2024-06-19: qty 4

## 2024-06-19 MED ORDER — POTASSIUM CHLORIDE CRYS ER 20 MEQ PO TBCR
40.0000 meq | EXTENDED_RELEASE_TABLET | Freq: Once | ORAL | Status: AC
  Administered 2024-06-19: 40 meq via ORAL
  Filled 2024-06-19: qty 2

## 2024-06-19 MED ORDER — TAMSULOSIN HCL 0.4 MG PO CAPS
0.4000 mg | ORAL_CAPSULE | Freq: Every day | ORAL | Status: DC
  Administered 2024-06-19: 0.4 mg via ORAL
  Filled 2024-06-19: qty 1

## 2024-06-19 MED ORDER — FUROSEMIDE 20 MG PO TABS: 20.0000 mg | ORAL_TABLET | Freq: Every day | ORAL | 0 refills | Status: AC

## 2024-06-19 MED FILL — Midazolam HCl Inj 2 MG/2ML (Base Equivalent): INTRAMUSCULAR | Qty: 1 | Status: AC

## 2024-06-19 MED FILL — Gentamicin Sulfate Inj 40 MG/ML: INTRAMUSCULAR | Qty: 80 | Status: AC

## 2024-06-19 NOTE — Progress Notes (Signed)
 CARDIAC REHAB PHASE I    Post TAVR education including site care, restrictions, risk factors, heart healthy diabetic diet, exercise guidelines, and CRP2 reviewed. All questions and concerns addressed.At this time, patient is not interested in cardiac rehab.    [phone] Isaiah JAYSON Liverpool, RN BSN 06/19/2024 10:23 AM

## 2024-06-19 NOTE — Progress Notes (Signed)
"  °  Patient Name: Benjamin Davila Date of Encounter: 06/19/2024  Primary Cardiologist: Jennifer JONELLE Crape, MD Electrophysiologist: None  Interval Summary   The patient is doing well today.  At this time, the patient denies chest pain, shortness of breath, or any new concerns.  Vital Signs    Vitals:   06/19/24 0500 06/19/24 0600 06/19/24 0700 06/19/24 0800  BP: (!) 101/54 (!) 131/59 (!) 129/53 114/60  Pulse: 60 66 76 84  Resp: 15 18 (!) 21 (!) 21  Temp:    98.5 F (36.9 C)  TempSrc:    Oral  SpO2: 93% 90% 93% 90%  Weight: 83 kg     Height:        Intake/Output Summary (Last 24 hours) at 06/19/2024 9161 Last data filed at 06/19/2024 0800 Gross per 24 hour  Intake 383.45 ml  Output 1150 ml  Net -766.55 ml   Filed Weights   06/17/24 0825 06/18/24 0500 06/19/24 0500  Weight: 80.3 kg 84.9 kg 83 kg    Physical Exam    GEN- pleasant elderly adult male in NAD, Alert and oriented  Lungs- Clear to ausculation bilaterally, normal work of breathing Cardiac- Regular rate and rhythm, no murmurs, rubs or gallops. PPM site within normal limits / no significant swelling / ecchymosis  GI- soft, NT, ND, + BS Extremities- no clubbing or cyanosis. No edema  Telemetry    VP 60-90's (personally reviewed)  Hospital Course    Benjamin Davila is a 84 y.o. male with PMH of RBBB, HTN, severe AS, CAD, DM admitted for TAVR. On 06/17/24 he underwent placement of Edwards Sapien 3 THV - 29 mm. He had no paravalvular leak and normal LV function on ECHO. Pre-procedure EKG review shows RBBB. 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  He had a dual chamber PPM placed on 06/18/24.  Assessment & Plan    CHB s/p TAVR  Pre-existing RBBB  LVEF 70% -EKG reviewed  -CXR within normal limits, no PTX  -PPM post procedure care / wound care reviewed with patient  -ok to discharge from EP standpoint  -EP follow up in place      For questions or updates, please contact  [facility] Please consult [email or web address] for contact info under     Signed, Daphne Barrack, NP-C, AGACNP-BC [facility] - Electrophysiology  06/19/2024, 8:47 AM    "

## 2024-06-19 NOTE — Telephone Encounter (Signed)
 Spoke with pt reports was released from the hospital about 4 hours ago.  Feels very weak like will pass out.  Can't walk 25 feet without feeling SOB and like will faint.   Reports recently had an Aortic Replacement and PPM placement.   Denies trouble taking a deep breath.  BP 155/76-115.  Denies pain just feels weak like will pass out.  Advised some tiredness is to be expected after surgery but shouldn't feel like he is going to pass out.  Advised of ED visit.  Pt expresses understanding.

## 2024-06-19 NOTE — Telephone Encounter (Signed)
 Pt c/o BP issue: STAT if pt c/o blurred vision, one-sided weakness or slurred speech  1. What are your last 5 BP readings? 155/76 HR 114  2. Are you having any other symptoms (ex. Dizziness, headache, blurred vision, passed out)? SOB, dizziness   3. What is your BP issue?    Patient was just discharge from the hospital, states he can walk more then 20 steps without feeling faint. Please advise

## 2024-06-20 ENCOUNTER — Encounter (HOSPITAL_COMMUNITY): Payer: Self-pay | Admitting: Student in an Organized Health Care Education/Training Program

## 2024-06-20 DIAGNOSIS — I442 Atrioventricular block, complete: Secondary | ICD-10-CM

## 2024-06-24 NOTE — Progress Notes (Unsigned)
 "  Structural Heart Clinic Note  No chief complaint on file.  History of Present Illness: 84 yo male with history of CAD, severe aortic stenosis post TAVR, anxiety, HTN, HLD, DM who is here today for follow up. He was found to have severe aortic stenosis. He underwent TAVR on 06/17/24 with placement of a 29 mm Edwards Sapien 3 Ultra Resilia THV from the transfemoral approach. He had complete heart block following the procedure and had a permanent pacemaker placed on 06/18/24. Cardiac cath prior to his TAVR with moderate non-obstructive CAD.   *** is here today for follow up. The patient denies any chest pain, dyspnea, palpitations, lower extremity edema, orthopnea, PND, dizziness, near syncope or syncope.   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 Verlin and Dr. 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   Type 2 diabetes mellitus with diabetic neuropathy, unspecified (HCC) 07/12/2020    Past Surgical History:  Procedure Laterality Date   BACK SURGERY     lumbar surgery 20 years ago per patient   CATARACT EXTRACTION     CHOLECYSTECTOMY     20 years ago per patient   COLONOSCOPY     INTRAOPERATIVE TRANSTHORACIC ECHOCARDIOGRAM N/A 06/17/2024   Procedure: ECHOCARDIOGRAM, TRANSTHORACIC;  Surgeon: Verlin Lonni BIRCH, MD;  Location: [facility] INVASIVE CV LAB;  Service: Cardiovascular;  Laterality: N/A;   LAMINECTOMY WITH  POSTERIOR LATERAL ARTHRODESIS LEVEL 2 Left 03/27/2023   Procedure: Left Lumbar Two-Three, Lumbar Three-Four Hemifacetectomy, posterolateral fusion Lumbar Two-Four, segmental fixation Lumbar Two-Four;  Surgeon: Joshua Alm Hamilton, MD;  Location: [facility] OR;  Service: Neurosurgery;  Laterality: Left;   LUMBAR LAMINECTOMY/DECOMPRESSION MICRODISCECTOMY Bilateral 08/09/2021   Procedure: Lumbar decompressive laminectomy  - Lumbar two-Lumabr three - Lumbar three-Lumbar four - Lumbar four-Lumbar five - Lumabr five-Sacal one - Bilateral, Posterior Lateral Fusion Lumbar three-four-Right;  Surgeon: Joshua Alm RAMAN, MD;  Location: [facility] OR;  Service: Neurosurgery;  Laterality: Bilateral;   PACEMAKER IMPLANT N/A 06/18/2024   Procedure: PACEMAKER IMPLANT;  Surgeon: Almetta Donnice LABOR, MD;  Location: [facility] INVASIVE CV LAB;  Service: Cardiovascular;  Laterality: N/A;   RIGHT/LEFT HEART CATH AND CORONARY ANGIOGRAPHY N/A 04/23/2024   Procedure: RIGHT/LEFT HEART CATH AND CORONARY ANGIOGRAPHY;  Surgeon: Mady Lonni, MD;  Location: [facility] INVASIVE CV LAB;  Service: Cardiovascular;  Laterality: N/A;   SPINE SURGERY     TONSILLECTOMY     as a kid    Current Outpatient Medications  Medication Sig Dispense Refill   acetaminophen  (TYLENOL ) 325 MG tablet Take 650 mg by mouth daily as needed (pain).     aspirin  EC 81 MG tablet Take 1 tablet (81 mg total) by mouth daily. Swallow whole.     atorvastatin  (LIPITOR) 20 MG tablet Take 1 tablet (20 mg total) by mouth every morning. 90 tablet 3   diazepam  (VALIUM ) 5 MG tablet Take 1 tablet (5  mg total) by mouth at bedtime as needed for anxiety (moods,and muscle spasm). 30 tablet 0   ferrous sulfate  325 (65 FE) MG tablet Take 325 mg by mouth daily.     furosemide  (LASIX ) 20 MG tablet Take 1 tablet (20 mg total) by mouth daily. 5 tablet 0   insulin  glargine (LANTUS  SOLOSTAR) 100 UNIT/ML Solostar Pen Inject 15 Units into the skin daily before breakfast.     KRILL OIL PO Take 1 capsule by mouth  daily.     losartan  (COZAAR ) 100 MG tablet Take 1 tablet (100 mg total) by mouth daily. 90 tablet 3   metFORMIN  (GLUCOPHAGE ) 500 MG tablet Take 1,000 mg by mouth 2 (two) times a day.     Multiple Vitamin (MULTIVITAMIN ADULT PO) Take 1 tablet by mouth daily.     tamsulosin  (FLOMAX ) 0.4 MG CAPS capsule Take 0.4 mg by mouth daily as needed (infrequent urination).     Testosterone 20.25 MG/1.25GM (1.62%) GEL Place 2 Pump onto the skin daily.     No current facility-administered medications for this visit.    Allergies[1]  Social History   Socioeconomic History   Marital status: Married    Spouse name: Not on file   Number of children: Not on file   Years of education: Not on file   Highest education level: Master's degree (e.g., MA, MS, MEng, MEd, MSW, MBA)  Occupational History   Not on file  Tobacco Use   Smoking status: Never   Smokeless tobacco: Never  Vaping Use   Vaping status: Never Used  Substance and Sexual Activity   Alcohol use: Never   Drug use: Never   Sexual activity: Yes  Other Topics Concern   Not on file  Social History Narrative   Not on file   Social Drivers of Health   Tobacco Use: Low Risk (06/17/2024)   Patient History    Smoking Tobacco Use: Never    Smokeless Tobacco Use: Never    Passive Exposure: Not on file  Financial Resource Strain: Low Risk (04/08/2024)   Overall Financial Resource Strain (CARDIA)    Difficulty of Paying Living Expenses: Not hard at all  Food Insecurity: No Food Insecurity (06/17/2024)   Epic    Worried About Radiation Protection Practitioner of Food in the Last Year: Never true    Ran Out of Food in the Last Year: Never true  Transportation Needs: No Transportation Needs (06/17/2024)   Epic    Lack of Transportation (Medical): No    Lack of Transportation (Non-Medical): No  Physical Activity: Insufficiently Active (04/08/2024)   Exercise Vital Sign    Days of Exercise per Week: 2 days    Minutes of Exercise per Session: 10 min  Stress:  Stress Concern Present (04/08/2024)   [employer] of Occupational Health - Occupational Stress Questionnaire    Feeling of Stress: To some extent  Social Connections: Socially Integrated (06/17/2024)   Social Connection and Isolation Panel    Frequency of Communication with Friends and Family: Once a week    Frequency of Social Gatherings with Friends and Family: Twice a week    Attends Religious Services: More than 4 times per year    Active Member of [employer] or Organizations: Yes    Attends Banker Meetings: More than 4 times per year    Marital Status: Married  Recent Concern: Social Connections - Socially Isolated (04/08/2024)   Social Connection and Isolation Panel    Frequency of Communication with Friends  and Family: Once a week    Frequency of Social Gatherings with Friends and Family: Once a week    Attends Religious Services: Never    Database Administrator or Organizations: No    Attends Engineer, Structural: Not on file    Marital Status: Married  Catering Manager Violence: Not At Risk (06/17/2024)   Epic    Fear of Current or Ex-Partner: No    Emotionally Abused: No    Physically Abused: No    Sexually Abused: No  Depression (PHQ2-9): Low Risk (04/09/2024)   Depression (PHQ2-9)    PHQ-2 Score: 4  Alcohol Screen: Not on file  Housing: Low Risk (06/17/2024)   Epic    Unable to Pay for Housing in the Last Year: No    Number of Times Moved in the Last Year: 0    Homeless in the Last Year: No  Utilities: Not At Risk (06/17/2024)   Epic    Threatened with loss of utilities: No  Health Literacy: Not on file    Family History  Problem Relation Age of Onset   Lupus Mother     Review of Systems:  As stated in the HPI and otherwise negative.   There were no vitals taken for this visit.  Physical Examination: General: Well developed, well nourished, NAD  HEENT: OP clear, mucus membranes moist  SKIN: warm, dry. No rashes. Neuro: No focal  deficits  Musculoskeletal: Muscle strength 5/5 all ext  Psychiatric: Mood and affect normal  Neck: No JVD, no carotid bruits, no thyromegaly, no lymphadenopathy.  Lungs:Clear bilaterally, no wheezes, rhonci, crackles Cardiovascular: Regular rate and rhythm. No murmurs, gallops or rubs. Abdomen:Soft. Bowel sounds present. Non-tender.  Extremities: No lower extremity edema. Pulses are 2 + in the bilateral DP/PT.  EKG:  EKG {ACTION; IS/IS WNU:[id number]} ordered today. The ekg ordered today demonstrates ***  Recent Labs: 06/12/2024: ALT 31 06/19/2024: BUN 14; Creatinine, Ser 0.89; Hemoglobin 10.0; Platelets 108; Potassium 3.7; Sodium 139   Lipid Panel    Component Value Date/Time   CHOL 116 02/07/2023 1056   TRIG 123 02/07/2023 1056   HDL 43 02/07/2023 1056   CHOLHDL 2.7 02/07/2023 1056   LDLCALC 51 02/07/2023 1056     Wt Readings from Last 3 Encounters:  06/19/24 182 lb 15.7 oz (83 kg)  06/11/24 180 lb (81.6 kg)  05/25/24 181 lb 4.8 oz (82.2 kg)    Assessment and Plan:   1. Severe aortic stenosis: He is now one week post TAVR. He is doing well. NYHA class ***> Groins stable. BP is stable. ***  Labs/ tests ordered today include:  No orders of the defined types were placed in this encounter.    Disposition:   F/U with me in ***    Signed, Lonni Cash, MD, [facility] 06/24/2024 12:55 PM    [facility] [address] [place], [place], [place]  [address] Phone: [phone]; Fax: [phone]       [1]  Allergies Allergen Reactions   Lisinopril Cough   "

## 2024-06-25 ENCOUNTER — Encounter: Payer: Self-pay | Admitting: Cardiovascular Disease

## 2024-06-25 ENCOUNTER — Ambulatory Visit: Admitting: Cardiovascular Disease

## 2024-06-25 VITALS — BP 130/66 | HR 84 | Resp 1 | Ht 69.0 in | Wt 182.2 lb

## 2024-06-25 DIAGNOSIS — I350 Nonrheumatic aortic (valve) stenosis: Secondary | ICD-10-CM

## 2024-06-25 NOTE — Patient Instructions (Signed)
 Medication Instructions:  Your physician recommends that you continue on your current medications as directed. Please refer to the Current Medication list given to you today.  *If you need a refill on your cardiac medications before your next appointment, please call your pharmacy*  Lab Work: None ordered If you have labs (blood work) drawn today and your tests are completely normal, you will receive your results only by: MyChart Message (if you have MyChart) OR A paper copy in the mail If you have any lab test that is abnormal or we need to change your treatment, we will call you to review the results.  Testing/Procedures: None ordered  Follow-Up: At [facility], you and your health needs are our priority.  As part of our continuing mission to provide you with exceptional heart care, our providers are all part of one team.  This team includes your primary Cardiologist (physician) and Advanced Practice Providers or APPs (Physician Assistants and Nurse Practitioners) who all work together to provide you with the care you need, when you need it.  Our team will contact you for your next follow-up     We recommend signing up for the patient portal called MyChart.  Sign up information is provided on this After Visit Summary.  MyChart is used to connect with patients for Virtual Visits (Telemedicine).  Patients are able to view lab/test results, encounter notes, upcoming appointments, etc.  Non-urgent messages can be sent to your provider as well.   To learn more about what you can do with MyChart, go to [email or web address].

## 2024-06-29 ENCOUNTER — Ambulatory Visit: Admitting: Physician Assistant

## 2024-07-01 ENCOUNTER — Ambulatory Visit

## 2024-07-10 ENCOUNTER — Ambulatory Visit: Admitting: Student in an Organized Health Care Education/Training Program

## 2024-07-15 ENCOUNTER — Ambulatory Visit (HOSPITAL_BASED_OUTPATIENT_CLINIC_OR_DEPARTMENT_OTHER)

## 2024-07-15 ENCOUNTER — Other Ambulatory Visit (HOSPITAL_BASED_OUTPATIENT_CLINIC_OR_DEPARTMENT_OTHER)

## 2024-07-15 ENCOUNTER — Ambulatory Visit: Admitting: Cardiology

## 2024-07-30 ENCOUNTER — Ambulatory Visit

## 2024-09-22 ENCOUNTER — Ambulatory Visit: Admitting: Student

## 2024-10-29 ENCOUNTER — Ambulatory Visit

## 2025-01-28 ENCOUNTER — Ambulatory Visit

## 2025-04-29 ENCOUNTER — Ambulatory Visit

## 2025-07-29 ENCOUNTER — Ambulatory Visit
